# Patient Record
Sex: Male | Born: 1940
Health system: Southern US, Community
[De-identification: ages and names within clinical notes are randomized; demographics above are authoritative.]

## PROBLEM LIST (undated history)

## (undated) DIAGNOSIS — S83206A Unspecified tear of unspecified meniscus, current injury, right knee, initial encounter: Secondary | ICD-10-CM

## (undated) DIAGNOSIS — Z86711 Personal history of pulmonary embolism: Secondary | ICD-10-CM

## (undated) DIAGNOSIS — Z860100 Personal history of colon polyps, unspecified: Secondary | ICD-10-CM

## (undated) DIAGNOSIS — I10 Essential (primary) hypertension: Secondary | ICD-10-CM

## (undated) DIAGNOSIS — M1990 Unspecified osteoarthritis, unspecified site: Secondary | ICD-10-CM

## (undated) DIAGNOSIS — E785 Hyperlipidemia, unspecified: Secondary | ICD-10-CM

## (undated) DIAGNOSIS — Z9189 Other specified personal risk factors, not elsewhere classified: Secondary | ICD-10-CM

## (undated) DIAGNOSIS — R5382 Chronic fatigue, unspecified: Secondary | ICD-10-CM

## (undated) DIAGNOSIS — H269 Unspecified cataract: Secondary | ICD-10-CM

## (undated) DIAGNOSIS — F419 Anxiety disorder, unspecified: Secondary | ICD-10-CM

## (undated) HISTORY — DX: Hyperlipidemia, unspecified: E78.5

## (undated) HISTORY — DX: Essential (primary) hypertension: I10

## (undated) HISTORY — DX: Unspecified cataract: H26.9

## (undated) HISTORY — DX: Anxiety disorder, unspecified: F41.9

## (undated) HISTORY — PX: COLONOSCOPY W/ POLYPECTOMY: SHX1380

---

## 1983-05-21 HISTORY — PX: NASAL FRACTURE SURGERY: SHX718

## 1998-08-24 ENCOUNTER — Other Ambulatory Visit: Admission: RE | Admit: 1998-08-24 | Discharge: 1998-08-24 | Payer: Self-pay | Admitting: Internal Medicine

## 2000-12-30 ENCOUNTER — Encounter: Payer: Self-pay | Admitting: Internal Medicine

## 2000-12-30 ENCOUNTER — Encounter (INDEPENDENT_AMBULATORY_CARE_PROVIDER_SITE_OTHER): Payer: Self-pay | Admitting: Specialist

## 2000-12-30 ENCOUNTER — Other Ambulatory Visit: Admission: RE | Admit: 2000-12-30 | Discharge: 2000-12-30 | Payer: Self-pay | Admitting: Internal Medicine

## 2003-09-07 ENCOUNTER — Encounter: Payer: Self-pay | Admitting: Internal Medicine

## 2004-10-31 ENCOUNTER — Ambulatory Visit: Payer: Self-pay | Admitting: Internal Medicine

## 2004-11-12 ENCOUNTER — Ambulatory Visit: Payer: Self-pay | Admitting: Internal Medicine

## 2004-12-12 ENCOUNTER — Ambulatory Visit: Payer: Self-pay | Admitting: Internal Medicine

## 2004-12-19 ENCOUNTER — Ambulatory Visit: Payer: Self-pay | Admitting: Internal Medicine

## 2005-05-27 ENCOUNTER — Ambulatory Visit: Payer: Self-pay | Admitting: Internal Medicine

## 2005-05-30 ENCOUNTER — Ambulatory Visit: Payer: Self-pay | Admitting: Internal Medicine

## 2005-07-15 ENCOUNTER — Ambulatory Visit: Payer: Self-pay | Admitting: Internal Medicine

## 2005-12-31 ENCOUNTER — Ambulatory Visit: Payer: Self-pay | Admitting: Internal Medicine

## 2006-01-15 ENCOUNTER — Ambulatory Visit: Payer: Self-pay | Admitting: Internal Medicine

## 2006-01-23 ENCOUNTER — Ambulatory Visit: Payer: Self-pay | Admitting: Internal Medicine

## 2006-05-15 ENCOUNTER — Ambulatory Visit: Payer: Self-pay | Admitting: Critical Care Medicine

## 2006-05-30 ENCOUNTER — Ambulatory Visit: Payer: Self-pay | Admitting: Internal Medicine

## 2006-06-04 ENCOUNTER — Ambulatory Visit: Payer: Self-pay | Admitting: Cardiology

## 2006-07-24 ENCOUNTER — Encounter: Admission: RE | Admit: 2006-07-24 | Discharge: 2006-07-29 | Payer: Self-pay | Admitting: Neurology

## 2007-06-15 ENCOUNTER — Telehealth (INDEPENDENT_AMBULATORY_CARE_PROVIDER_SITE_OTHER): Payer: Self-pay | Admitting: *Deleted

## 2007-07-01 DIAGNOSIS — E785 Hyperlipidemia, unspecified: Secondary | ICD-10-CM

## 2007-07-01 DIAGNOSIS — I10 Essential (primary) hypertension: Secondary | ICD-10-CM | POA: Insufficient documentation

## 2007-07-01 DIAGNOSIS — Z860100 Personal history of colon polyps, unspecified: Secondary | ICD-10-CM | POA: Insufficient documentation

## 2007-07-01 DIAGNOSIS — Z8669 Personal history of other diseases of the nervous system and sense organs: Secondary | ICD-10-CM | POA: Insufficient documentation

## 2007-07-02 ENCOUNTER — Ambulatory Visit: Payer: Self-pay | Admitting: Internal Medicine

## 2007-07-02 DIAGNOSIS — K625 Hemorrhage of anus and rectum: Secondary | ICD-10-CM

## 2007-07-02 DIAGNOSIS — D508 Other iron deficiency anemias: Secondary | ICD-10-CM | POA: Insufficient documentation

## 2007-07-06 ENCOUNTER — Ambulatory Visit: Payer: Self-pay | Admitting: Internal Medicine

## 2007-07-24 ENCOUNTER — Ambulatory Visit: Payer: Self-pay | Admitting: Internal Medicine

## 2007-07-24 ENCOUNTER — Encounter: Payer: Self-pay | Admitting: Internal Medicine

## 2007-08-05 ENCOUNTER — Ambulatory Visit: Payer: Self-pay | Admitting: Internal Medicine

## 2007-09-07 ENCOUNTER — Ambulatory Visit: Payer: Self-pay | Admitting: Internal Medicine

## 2007-09-07 LAB — CONVERTED CEMR LAB
Eosinophils Absolute: 0 10*3/uL (ref 0.0–0.7)
HCT: 42.5 % (ref 39.0–52.0)
Hemoglobin: 14.3 g/dL (ref 13.0–17.0)
MCV: 80.1 fL (ref 78.0–100.0)
Monocytes Absolute: 0.9 10*3/uL (ref 0.1–1.0)
Monocytes Relative: 9.2 % (ref 3.0–12.0)
Neutro Abs: 6.9 10*3/uL (ref 1.4–7.7)
Platelets: 250 10*3/uL (ref 150–400)
RDW: 21.1 % — ABNORMAL HIGH (ref 11.5–14.6)

## 2007-11-10 ENCOUNTER — Ambulatory Visit: Payer: Self-pay | Admitting: Internal Medicine

## 2008-04-29 ENCOUNTER — Telehealth: Payer: Self-pay | Admitting: Internal Medicine

## 2008-05-10 ENCOUNTER — Encounter: Payer: Self-pay | Admitting: Internal Medicine

## 2008-06-01 ENCOUNTER — Ambulatory Visit: Payer: Self-pay | Admitting: Internal Medicine

## 2008-06-01 DIAGNOSIS — K5150 Left sided colitis without complications: Secondary | ICD-10-CM | POA: Insufficient documentation

## 2008-06-09 ENCOUNTER — Encounter: Payer: Self-pay | Admitting: Internal Medicine

## 2008-06-28 ENCOUNTER — Ambulatory Visit: Payer: Self-pay | Admitting: Internal Medicine

## 2008-06-28 ENCOUNTER — Inpatient Hospital Stay (HOSPITAL_COMMUNITY): Admission: AD | Admit: 2008-06-28 | Discharge: 2008-07-03 | Payer: Self-pay | Admitting: Pulmonary Disease

## 2008-06-28 ENCOUNTER — Encounter (INDEPENDENT_AMBULATORY_CARE_PROVIDER_SITE_OTHER): Payer: Self-pay | Admitting: *Deleted

## 2008-06-28 DIAGNOSIS — R079 Chest pain, unspecified: Secondary | ICD-10-CM | POA: Insufficient documentation

## 2008-07-01 ENCOUNTER — Ambulatory Visit: Payer: Self-pay | Admitting: Gastroenterology

## 2008-07-05 ENCOUNTER — Encounter: Payer: Self-pay | Admitting: Internal Medicine

## 2008-07-06 ENCOUNTER — Inpatient Hospital Stay (HOSPITAL_COMMUNITY): Admission: EM | Admit: 2008-07-06 | Discharge: 2008-07-13 | Payer: Self-pay | Admitting: Emergency Medicine

## 2008-07-06 ENCOUNTER — Ambulatory Visit: Payer: Self-pay | Admitting: Internal Medicine

## 2008-07-07 ENCOUNTER — Ambulatory Visit: Payer: Self-pay | Admitting: Vascular Surgery

## 2008-07-07 ENCOUNTER — Encounter: Payer: Self-pay | Admitting: Internal Medicine

## 2008-07-08 ENCOUNTER — Encounter (INDEPENDENT_AMBULATORY_CARE_PROVIDER_SITE_OTHER): Payer: Self-pay | Admitting: Internal Medicine

## 2008-07-15 ENCOUNTER — Ambulatory Visit: Payer: Self-pay | Admitting: Internal Medicine

## 2008-07-20 ENCOUNTER — Ambulatory Visit: Payer: Self-pay | Admitting: Internal Medicine

## 2008-07-20 DIAGNOSIS — I2699 Other pulmonary embolism without acute cor pulmonale: Secondary | ICD-10-CM | POA: Insufficient documentation

## 2008-07-20 LAB — CONVERTED CEMR LAB
Basophils Absolute: 0 10*3/uL (ref 0.0–0.1)
Eosinophils Absolute: 0.3 10*3/uL (ref 0.0–0.7)
Lymphocytes Relative: 15.8 % (ref 12.0–46.0)
MCHC: 33.1 g/dL (ref 30.0–36.0)
MCV: 85.3 fL (ref 78.0–100.0)
Neutrophils Relative %: 77.3 % — ABNORMAL HIGH (ref 43.0–77.0)
Platelets: 254 10*3/uL (ref 150–400)
RDW: 14.2 % (ref 11.5–14.6)

## 2008-07-21 ENCOUNTER — Telehealth (INDEPENDENT_AMBULATORY_CARE_PROVIDER_SITE_OTHER): Payer: Self-pay | Admitting: *Deleted

## 2008-07-25 ENCOUNTER — Ambulatory Visit: Payer: Self-pay | Admitting: Internal Medicine

## 2008-08-02 ENCOUNTER — Emergency Department (HOSPITAL_COMMUNITY): Admission: EM | Admit: 2008-08-02 | Discharge: 2008-08-02 | Payer: Self-pay | Admitting: Emergency Medicine

## 2008-08-03 ENCOUNTER — Ambulatory Visit: Payer: Self-pay | Admitting: Internal Medicine

## 2008-08-03 ENCOUNTER — Ambulatory Visit: Payer: Self-pay | Admitting: Cardiology

## 2008-08-04 ENCOUNTER — Emergency Department (HOSPITAL_COMMUNITY): Admission: EM | Admit: 2008-08-04 | Discharge: 2008-08-04 | Payer: Self-pay | Admitting: *Deleted

## 2008-08-17 ENCOUNTER — Ambulatory Visit: Payer: Self-pay | Admitting: Cardiology

## 2008-08-17 ENCOUNTER — Ambulatory Visit: Payer: Self-pay | Admitting: Internal Medicine

## 2008-08-30 ENCOUNTER — Ambulatory Visit: Payer: Self-pay | Admitting: Cardiology

## 2008-09-13 ENCOUNTER — Ambulatory Visit: Payer: Self-pay | Admitting: Cardiology

## 2008-10-04 ENCOUNTER — Ambulatory Visit: Payer: Self-pay | Admitting: Cardiovascular Disease

## 2008-10-10 ENCOUNTER — Telehealth (INDEPENDENT_AMBULATORY_CARE_PROVIDER_SITE_OTHER): Payer: Self-pay | Admitting: *Deleted

## 2008-10-18 ENCOUNTER — Encounter: Payer: Self-pay | Admitting: *Deleted

## 2008-10-21 ENCOUNTER — Ambulatory Visit: Payer: Self-pay | Admitting: Internal Medicine

## 2008-10-21 DIAGNOSIS — R5381 Other malaise: Secondary | ICD-10-CM

## 2008-10-21 DIAGNOSIS — R5383 Other fatigue: Secondary | ICD-10-CM

## 2008-11-01 ENCOUNTER — Ambulatory Visit: Payer: Self-pay

## 2008-11-01 ENCOUNTER — Encounter: Payer: Self-pay | Admitting: Internal Medicine

## 2008-11-01 HISTORY — PX: TRANSTHORACIC ECHOCARDIOGRAM: SHX275

## 2008-11-02 ENCOUNTER — Telehealth (INDEPENDENT_AMBULATORY_CARE_PROVIDER_SITE_OTHER): Payer: Self-pay | Admitting: *Deleted

## 2008-11-04 ENCOUNTER — Telehealth: Payer: Self-pay | Admitting: Internal Medicine

## 2008-11-08 ENCOUNTER — Ambulatory Visit: Payer: Self-pay | Admitting: Internal Medicine

## 2008-11-08 DIAGNOSIS — IMO0002 Reserved for concepts with insufficient information to code with codable children: Secondary | ICD-10-CM

## 2008-11-14 ENCOUNTER — Telehealth (INDEPENDENT_AMBULATORY_CARE_PROVIDER_SITE_OTHER): Payer: Self-pay | Admitting: *Deleted

## 2008-11-17 ENCOUNTER — Encounter: Payer: Self-pay | Admitting: Internal Medicine

## 2008-11-23 ENCOUNTER — Encounter: Payer: Self-pay | Admitting: *Deleted

## 2008-12-21 ENCOUNTER — Ambulatory Visit: Payer: Self-pay | Admitting: Internal Medicine

## 2008-12-28 ENCOUNTER — Encounter: Payer: Self-pay | Admitting: Cardiology

## 2009-02-16 ENCOUNTER — Ambulatory Visit: Payer: Self-pay | Admitting: Internal Medicine

## 2009-02-16 DIAGNOSIS — R51 Headache: Secondary | ICD-10-CM

## 2009-02-16 DIAGNOSIS — R519 Headache, unspecified: Secondary | ICD-10-CM | POA: Insufficient documentation

## 2009-04-24 ENCOUNTER — Encounter: Payer: Self-pay | Admitting: Internal Medicine

## 2009-05-09 ENCOUNTER — Ambulatory Visit: Payer: Self-pay | Admitting: Internal Medicine

## 2009-05-10 LAB — CONVERTED CEMR LAB
Alkaline Phosphatase: 66 units/L (ref 39–117)
BUN: 12 mg/dL (ref 6–23)
Basophils Relative: 0.8 % (ref 0.0–3.0)
CRP, High Sensitivity: 21.4 — ABNORMAL HIGH (ref 0.00–5.00)
Eosinophils Relative: 2.8 % (ref 0.0–5.0)
Glucose, Bld: 100 mg/dL — ABNORMAL HIGH (ref 70–99)
HCT: 41.7 % (ref 39.0–52.0)
Lymphs Abs: 2.7 10*3/uL (ref 0.7–4.0)
MCV: 83.3 fL (ref 78.0–100.0)
Monocytes Absolute: 0.9 10*3/uL (ref 0.1–1.0)
Platelets: 267 10*3/uL (ref 150.0–400.0)
RBC: 5.01 M/uL (ref 4.22–5.81)
Total Bilirubin: 0.4 mg/dL (ref 0.3–1.2)
WBC: 7.8 10*3/uL (ref 4.5–10.5)

## 2009-05-26 ENCOUNTER — Ambulatory Visit: Payer: Self-pay | Admitting: Internal Medicine

## 2009-05-26 DIAGNOSIS — R05 Cough: Secondary | ICD-10-CM | POA: Insufficient documentation

## 2009-06-07 ENCOUNTER — Encounter: Payer: Self-pay | Admitting: Internal Medicine

## 2009-06-08 ENCOUNTER — Ambulatory Visit: Payer: Self-pay | Admitting: Internal Medicine

## 2009-06-10 ENCOUNTER — Encounter: Payer: Self-pay | Admitting: Internal Medicine

## 2009-06-13 ENCOUNTER — Ambulatory Visit: Payer: Self-pay | Admitting: Internal Medicine

## 2009-06-14 LAB — CONVERTED CEMR LAB
ALT: 17 units/L (ref 0–53)
AST: 18 units/L (ref 0–37)
Albumin: 3.4 g/dL — ABNORMAL LOW (ref 3.5–5.2)
Basophils Absolute: 0.1 10*3/uL (ref 0.0–0.1)
Calcium: 9.2 mg/dL (ref 8.4–10.5)
Cholesterol: 149 mg/dL (ref 0–200)
GFR calc non Af Amer: 89.67 mL/min (ref >60)
Glucose, Bld: 110 mg/dL — ABNORMAL HIGH (ref 70–99)
HDL: 34.1 mg/dL — ABNORMAL LOW (ref >39.00)
Hemoglobin: 12.6 g/dL — ABNORMAL LOW (ref 13.0–17.0)
Ketones, ur: NEGATIVE mg/dL
Leukocytes, UA: NEGATIVE
Lymphocytes Relative: 37.5 % (ref 12.0–46.0)
Monocytes Relative: 13 % — ABNORMAL HIGH (ref 3.0–12.0)
Neutro Abs: 3.2 10*3/uL (ref 1.4–7.7)
Neutrophils Relative %: 44.1 % (ref 43.0–77.0)
Nitrite: NEGATIVE
RBC: 4.91 M/uL (ref 4.22–5.81)
RDW: 14.6 % (ref 11.5–14.6)
Sodium: 140 meq/L (ref 135–145)
Specific Gravity, Urine: >=1.03 (ref 1.000–1.030)
VLDL: 18.6 mg/dL (ref 0.0–40.0)
pH: 5.5 (ref 5.0–8.0)

## 2009-06-15 ENCOUNTER — Telehealth: Payer: Self-pay | Admitting: Internal Medicine

## 2009-06-17 IMAGING — CT THORAX^01PE (ADULT)
2 of 6 series · 14 of 36 positions shown, 18 images · IV contrast (APPLIED)
Comparison: CTA chest 07/06/2008 and chest CT 06/29/2008

CLINICAL DATA: Shortness of breath patient with recent pneumonia
and history of pulmonary emboli.  The patient is on Coumadin.

CT ANGIOGRAPHY CHEST
TECHNIQUE: Multidetector CT imaging of the chest was performed
using the standard protocol during bolus administration of
intravenous contrast. Multiplanar CT image reconstructions
including MIPs were obtained to evaluate the vascular anatomy.
Contrast: 80 ml [id number]-X77

[Series 6: pe thins @ 1mm · axial · 0.78mm/px · z∈[-196,+36]mm · 13 of 258 slices shown, 17 images]
[im 13/258  mediastinal]
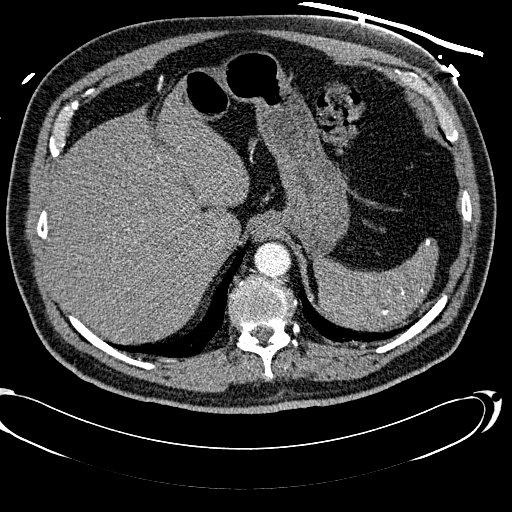
[im 13/258  lung]
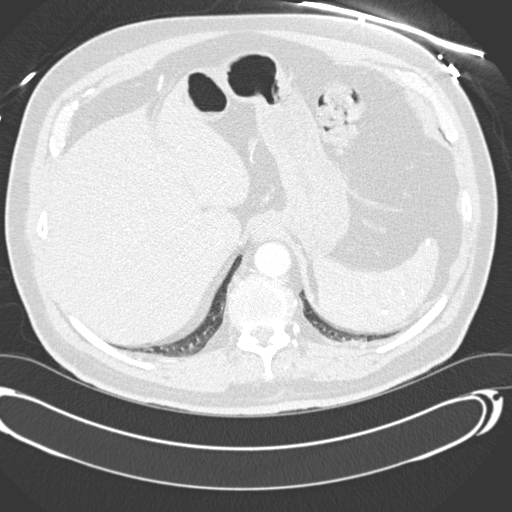
[im 39/258  lung]
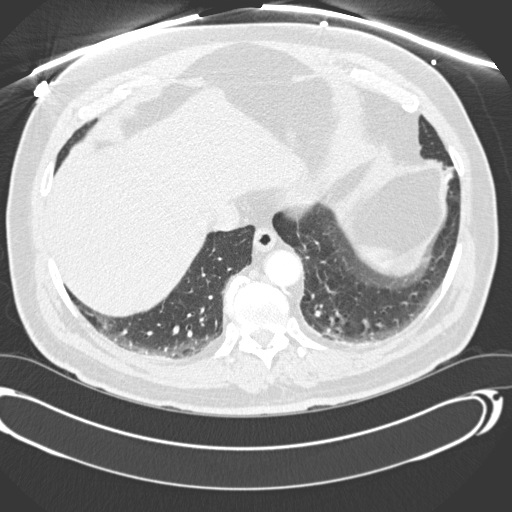
[im 52/258  lung]
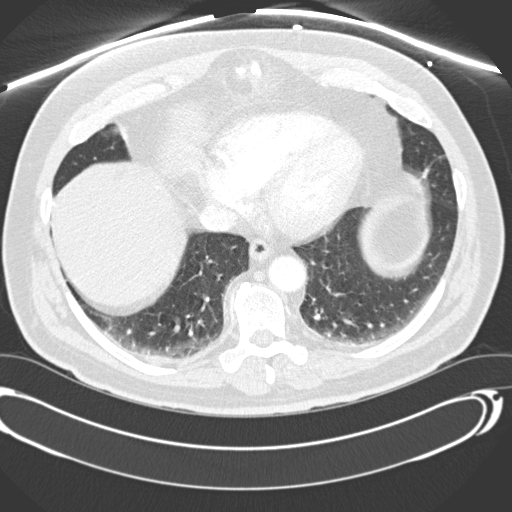
[im 78/258  lung]
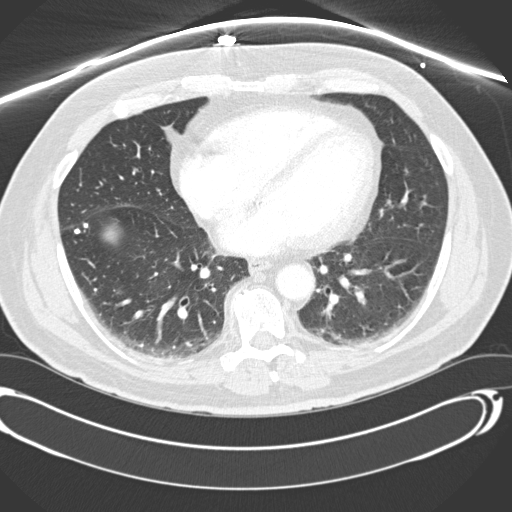
[im 90/258  mediastinal]
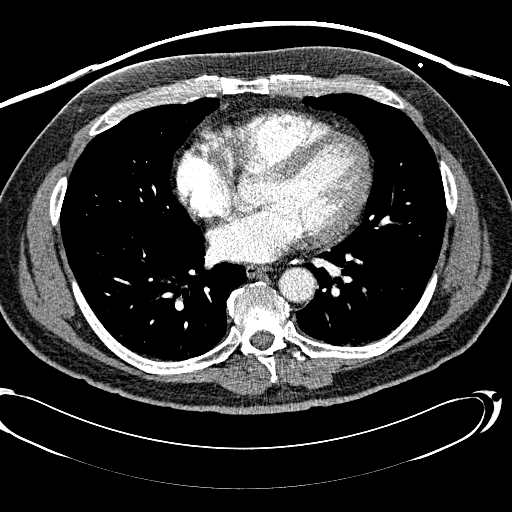
[im 90/258  lung]
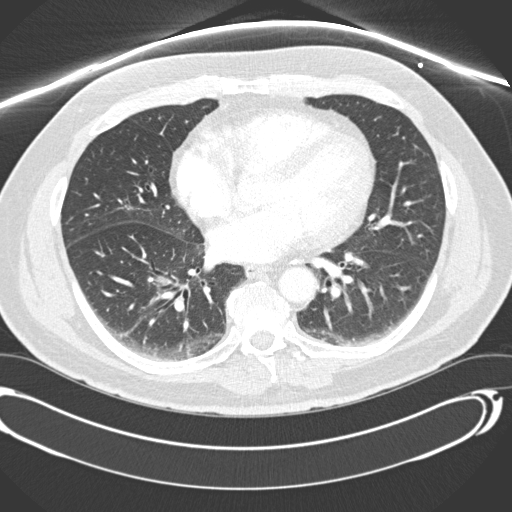
[im 116/258  lung]
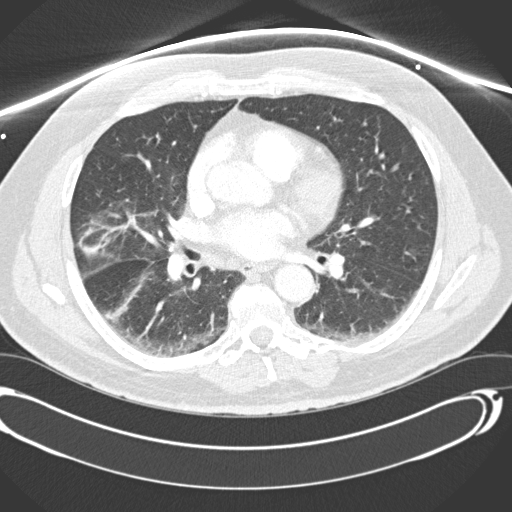
[im 129/258  lung]
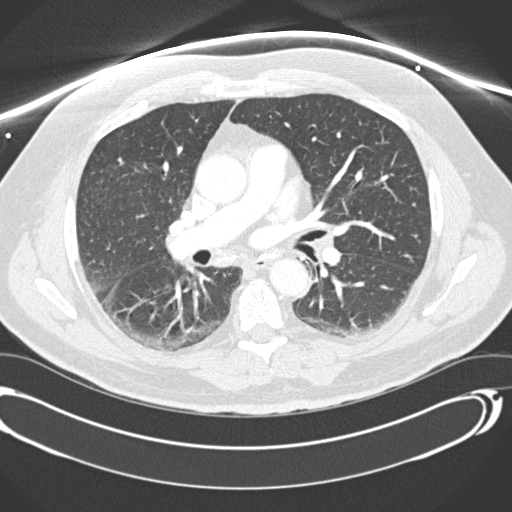
[im 142/258  lung]
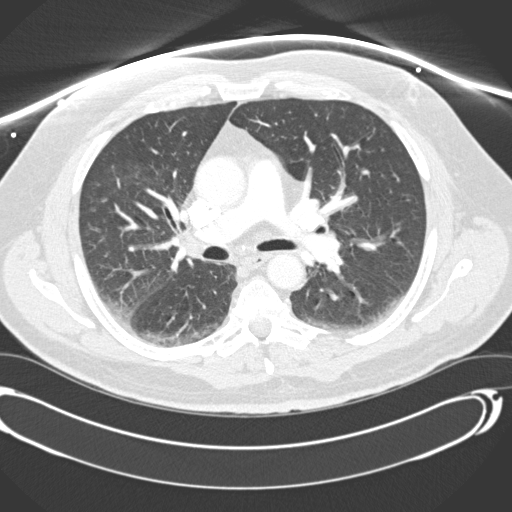
[im 168/258  mediastinal]
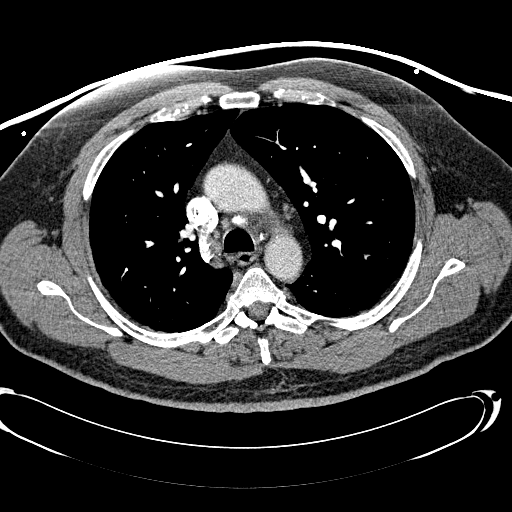
[im 168/258  lung]
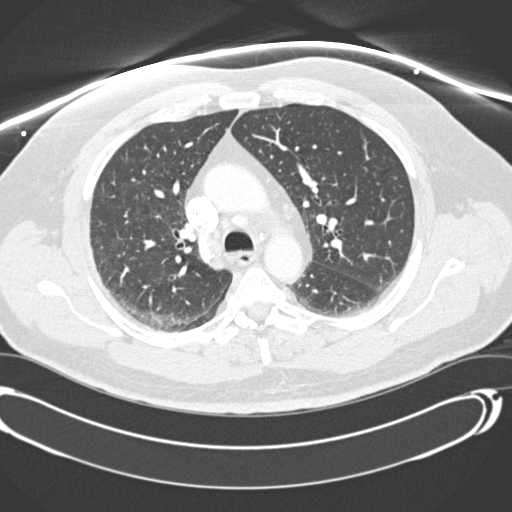
[im 180/258  lung]
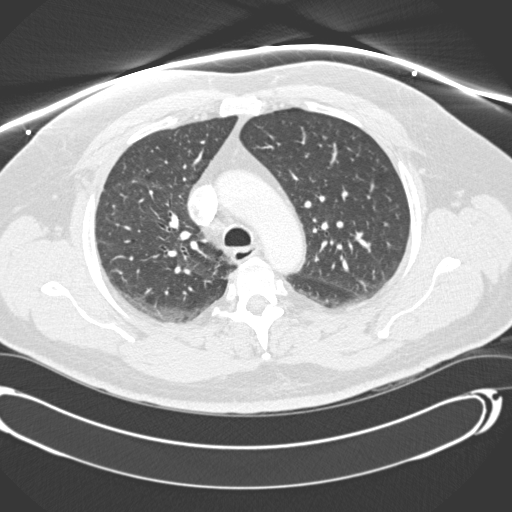
[im 206/258  lung]
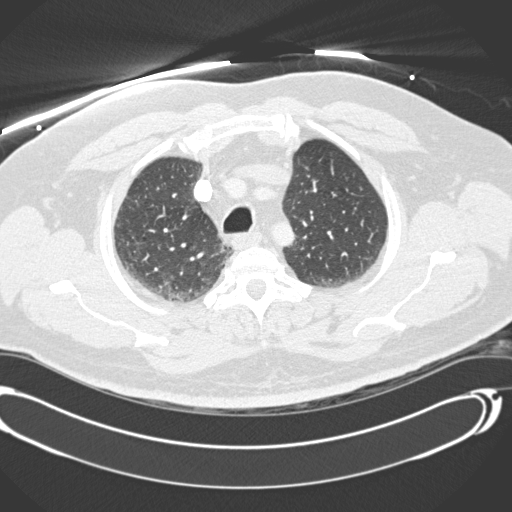
[im 219/258  lung]
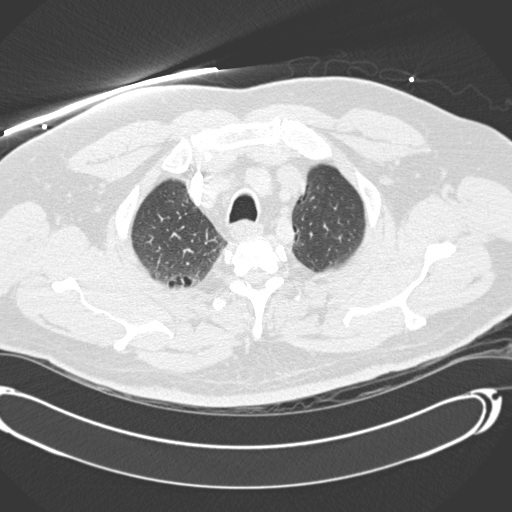
[im 245/258  mediastinal]
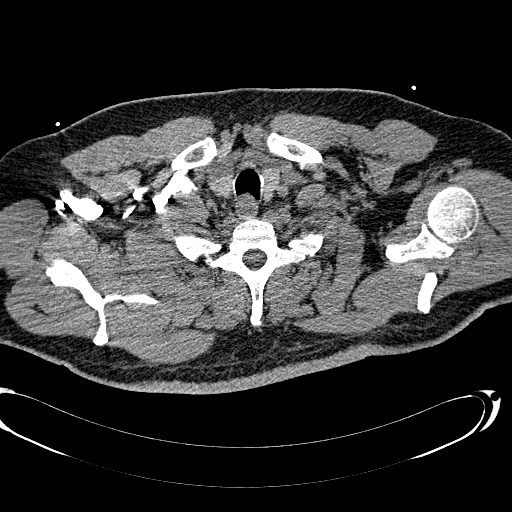
[im 245/258  lung]
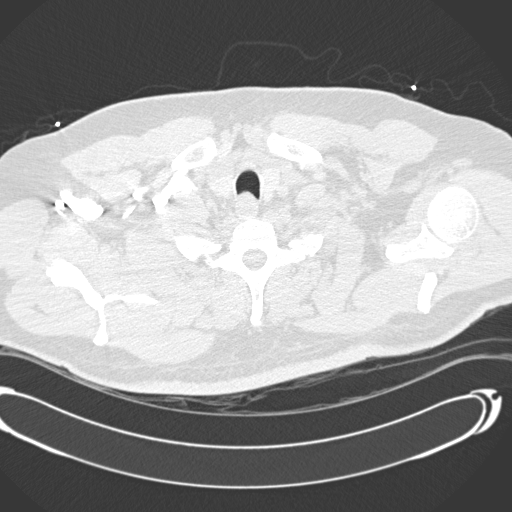

[Series 603: <mpr thick range> · coronal · 0.78mm/px · 1 of 121 slices shown]
[im 61/121  lung]
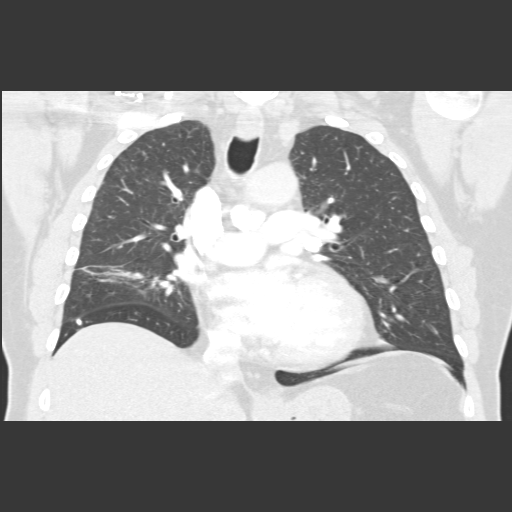

[14 of 36 positions shown; findings below may reference images not displayed]

FINDINGS: This is a technically moderate study for evaluating the
pulmonary arterial tree.  Compared to the CTA of the chest from
July 06, 2008, there has been marked recanalization of the
pulmonary arteries (the degree of pulmonary arterial embolism is
markedly decreased/nearly completely resolved on the current
examination).  On image number 182 of series 6, there is a small
residual filling defect in a subsegmental branch of the left lower
lobe.

There is no evidence of new pulmonary emboli compared to the
June 2008 examination.  Again noted are calcified mediastinal
lymph and hilar lymph nodes consistent with prior granulomatous
disease.  Coronary artery calcifications.  Mild cardiomegaly.
Thoracic aorta normal in caliber.  Negative for dissection.  Mild
paraseptal emphysema is present.  Ground-glass and interstitial
opacities in the right upper lobe have decreased, but not
completely resolved compared to the recent prior examinations.
Dependent atelectasis in both lower lobes.  No new areas of
airspace disease are identified.

There is no pleural or pericardial effusion.

Normal vertebral body heights and alignment of the visualized
thoracic spine.

 Review of the MIP images confirms the above findings.
IMPRESSION: 1. Near complete resolution of bilateral pulmonary emboli compared
to the CTA of 07/06/2008.  No evidence of new/interval pulmonary
emboli are identified.  Currently, only one definite filling defect
is identified in a subsegmental branch of the left lower lobe.
2.  Improved aeration of the right upper lobe, likely reflects
continued improvement in pneumonia, which was initially identified
on the chest CT June 29, 2008/chest radiograph of 06/28/2008.
[DATE].  No acute findings in the chest.

## 2009-06-23 ENCOUNTER — Ambulatory Visit: Payer: Self-pay | Admitting: Internal Medicine

## 2009-06-27 DIAGNOSIS — G4700 Insomnia, unspecified: Secondary | ICD-10-CM

## 2009-07-04 ENCOUNTER — Encounter: Payer: Self-pay | Admitting: Internal Medicine

## 2009-07-17 ENCOUNTER — Telehealth: Payer: Self-pay | Admitting: Internal Medicine

## 2009-07-17 ENCOUNTER — Ambulatory Visit: Payer: Self-pay | Admitting: Internal Medicine

## 2009-07-17 DIAGNOSIS — K602 Anal fissure, unspecified: Secondary | ICD-10-CM

## 2009-07-20 ENCOUNTER — Telehealth: Payer: Self-pay | Admitting: Internal Medicine

## 2009-07-27 ENCOUNTER — Telehealth: Payer: Self-pay | Admitting: Internal Medicine

## 2009-07-28 ENCOUNTER — Telehealth: Payer: Self-pay | Admitting: Internal Medicine

## 2009-08-01 ENCOUNTER — Ambulatory Visit: Payer: Self-pay | Admitting: Internal Medicine

## 2009-08-01 ENCOUNTER — Encounter: Payer: Self-pay | Admitting: Adult Health

## 2009-08-01 DIAGNOSIS — H6120 Impacted cerumen, unspecified ear: Secondary | ICD-10-CM | POA: Insufficient documentation

## 2009-08-01 DIAGNOSIS — R3129 Other microscopic hematuria: Secondary | ICD-10-CM

## 2009-08-03 LAB — CONVERTED CEMR LAB
Nitrite: NEGATIVE
Specific Gravity, Urine: >=1.03 (ref 1.000–1.030)
Total Protein, Urine: NEGATIVE mg/dL
pH: 5 (ref 5.0–8.0)

## 2009-08-04 ENCOUNTER — Telehealth: Payer: Self-pay | Admitting: Internal Medicine

## 2009-08-04 LAB — CONVERTED CEMR LAB
Basophils Absolute: 0.2 10*3/uL — ABNORMAL HIGH (ref 0.0–0.1)
HCT: 38.8 % — ABNORMAL LOW (ref 39.0–52.0)
Lymphocytes Relative: 33.7 % (ref 12.0–46.0)
Lymphs Abs: 3.5 10*3/uL (ref 0.7–4.0)
Monocytes Relative: 8.2 % (ref 3.0–12.0)
Neutrophils Relative %: 55.4 % (ref 43.0–77.0)
Platelets: 289 10*3/uL (ref 150.0–400.0)
RDW: 16.2 % — ABNORMAL HIGH (ref 11.5–14.6)

## 2009-08-11 ENCOUNTER — Telehealth: Payer: Self-pay | Admitting: Internal Medicine

## 2009-08-14 ENCOUNTER — Ambulatory Visit: Payer: Self-pay | Admitting: Internal Medicine

## 2009-08-14 LAB — CONVERTED CEMR LAB
Eosinophils Relative: 1.1 % (ref 0.0–5.0)
HCT: 37.9 % — ABNORMAL LOW (ref 39.0–52.0)
Lymphs Abs: 2.6 10*3/uL (ref 0.7–4.0)
Monocytes Relative: 6.6 % (ref 3.0–12.0)
Platelets: 247 10*3/uL (ref 150.0–400.0)
RBC: 4.59 M/uL (ref 4.22–5.81)
WBC: 8.4 10*3/uL (ref 4.5–10.5)

## 2009-08-22 ENCOUNTER — Encounter: Payer: Self-pay | Admitting: Adult Health

## 2009-08-29 ENCOUNTER — Ambulatory Visit: Payer: Self-pay | Admitting: Internal Medicine

## 2009-08-30 LAB — CONVERTED CEMR LAB
Basophils Relative: 0.6 % (ref 0.0–3.0)
Eosinophils Relative: 0.9 % (ref 0.0–5.0)
HCT: 38.8 % — ABNORMAL LOW (ref 39.0–52.0)
Lymphs Abs: 2.9 10*3/uL (ref 0.7–4.0)
MCV: 82.4 fL (ref 78.0–100.0)
Monocytes Absolute: 0.6 10*3/uL (ref 0.1–1.0)
Platelets: 307 10*3/uL (ref 150.0–400.0)
RBC: 4.71 M/uL (ref 4.22–5.81)
WBC: 8.1 10*3/uL (ref 4.5–10.5)

## 2009-09-07 ENCOUNTER — Telehealth: Payer: Self-pay | Admitting: Internal Medicine

## 2009-09-11 ENCOUNTER — Telehealth: Payer: Self-pay | Admitting: Internal Medicine

## 2009-09-15 ENCOUNTER — Telehealth: Payer: Self-pay | Admitting: Internal Medicine

## 2009-09-18 ENCOUNTER — Ambulatory Visit: Payer: Self-pay | Admitting: Internal Medicine

## 2009-09-18 LAB — CONVERTED CEMR LAB
Eosinophils Absolute: 0.1 10*3/uL (ref 0.0–0.7)
MCHC: 33.9 g/dL (ref 30.0–36.0)
MCV: 82 fL (ref 78.0–100.0)
Monocytes Absolute: 0.7 10*3/uL (ref 0.1–1.0)
Neutrophils Relative %: 54.2 % (ref 43.0–77.0)
Platelets: 256 10*3/uL (ref 150.0–400.0)

## 2009-09-26 ENCOUNTER — Telehealth: Payer: Self-pay | Admitting: Internal Medicine

## 2009-09-26 ENCOUNTER — Ambulatory Visit: Payer: Self-pay | Admitting: Internal Medicine

## 2009-10-02 ENCOUNTER — Telehealth: Payer: Self-pay | Admitting: Internal Medicine

## 2009-10-18 ENCOUNTER — Ambulatory Visit: Payer: Self-pay | Admitting: Internal Medicine

## 2009-10-18 LAB — CONVERTED CEMR LAB
Basophils Relative: 0.9 % (ref 0.0–3.0)
Eosinophils Absolute: 0.2 10*3/uL (ref 0.0–0.7)
Eosinophils Relative: 2.6 % (ref 0.0–5.0)
Lymphocytes Relative: 29.4 % (ref 12.0–46.0)
MCHC: 33.4 g/dL (ref 30.0–36.0)
Neutrophils Relative %: 56.6 % (ref 43.0–77.0)
RBC: 4.76 M/uL (ref 4.22–5.81)
WBC: 7.1 10*3/uL (ref 4.5–10.5)

## 2009-12-19 ENCOUNTER — Telehealth: Payer: Self-pay | Admitting: Internal Medicine

## 2009-12-21 ENCOUNTER — Ambulatory Visit: Payer: Self-pay | Admitting: Internal Medicine

## 2009-12-21 LAB — CONVERTED CEMR LAB
Basophils Relative: 0.8 % (ref 0.0–3.0)
Eosinophils Absolute: 0.4 10*3/uL (ref 0.0–0.7)
Eosinophils Relative: 4.1 % (ref 0.0–5.0)
Hemoglobin: 11.9 g/dL — ABNORMAL LOW (ref 13.0–17.0)
Lymphocytes Relative: 29 % (ref 12.0–46.0)
MCHC: 33.8 g/dL (ref 30.0–36.0)
Neutro Abs: 5 10*3/uL (ref 1.4–7.7)
RBC: 4.06 M/uL — ABNORMAL LOW (ref 4.22–5.81)
WBC: 8.9 10*3/uL (ref 4.5–10.5)

## 2009-12-25 ENCOUNTER — Ambulatory Visit: Payer: Self-pay | Admitting: Internal Medicine

## 2009-12-25 DIAGNOSIS — M79609 Pain in unspecified limb: Secondary | ICD-10-CM | POA: Insufficient documentation

## 2010-01-16 ENCOUNTER — Ambulatory Visit: Payer: Self-pay | Admitting: Internal Medicine

## 2010-01-25 ENCOUNTER — Ambulatory Visit: Payer: Self-pay | Admitting: Internal Medicine

## 2010-01-26 ENCOUNTER — Telehealth: Payer: Self-pay | Admitting: Internal Medicine

## 2010-01-30 LAB — CONVERTED CEMR LAB
Basophils Absolute: 0.1 10*3/uL (ref 0.0–0.1)
Basophils Relative: 0.8 % (ref 0.0–3.0)
Eosinophils Absolute: 0.3 10*3/uL (ref 0.0–0.7)
HCT: 32.9 % — ABNORMAL LOW (ref 39.0–52.0)
Hemoglobin: 11.2 g/dL — ABNORMAL LOW (ref 13.0–17.0)
Lymphocytes Relative: 26.3 % (ref 12.0–46.0)
Lymphs Abs: 1.8 10*3/uL (ref 0.7–4.0)
MCHC: 34 g/dL (ref 30.0–36.0)
MCV: 85.8 fL (ref 78.0–100.0)
Monocytes Absolute: 0.7 10*3/uL (ref 0.1–1.0)
Neutro Abs: 3.9 10*3/uL (ref 1.4–7.7)
RDW: 17.5 % — ABNORMAL HIGH (ref 11.5–14.6)

## 2010-01-31 ENCOUNTER — Ambulatory Visit: Payer: Self-pay | Admitting: Internal Medicine

## 2010-03-09 ENCOUNTER — Encounter: Payer: Self-pay | Admitting: Internal Medicine

## 2010-03-15 ENCOUNTER — Ambulatory Visit: Payer: Self-pay | Admitting: Internal Medicine

## 2010-03-15 ENCOUNTER — Telehealth: Payer: Self-pay | Admitting: Internal Medicine

## 2010-04-17 ENCOUNTER — Telehealth: Payer: Self-pay | Admitting: Internal Medicine

## 2010-04-19 ENCOUNTER — Ambulatory Visit: Payer: Self-pay | Admitting: Internal Medicine

## 2010-04-23 LAB — CONVERTED CEMR LAB
Basophils Absolute: 0 10*3/uL (ref 0.0–0.1)
Eosinophils Absolute: 0.1 10*3/uL (ref 0.0–0.7)
Lymphocytes Relative: 27.1 % (ref 12.0–46.0)
MCHC: 33.7 g/dL (ref 30.0–36.0)
Monocytes Relative: 13.3 % — ABNORMAL HIGH (ref 3.0–12.0)
Platelets: 244 10*3/uL (ref 150.0–400.0)
RDW: 17.8 % — ABNORMAL HIGH (ref 11.5–14.6)

## 2010-04-30 ENCOUNTER — Ambulatory Visit: Payer: Self-pay | Admitting: Internal Medicine

## 2010-05-04 ENCOUNTER — Encounter: Payer: Self-pay | Admitting: Internal Medicine

## 2010-05-08 ENCOUNTER — Ambulatory Visit: Payer: Self-pay | Admitting: Internal Medicine

## 2010-06-14 ENCOUNTER — Ambulatory Visit: Admit: 2010-06-14 | Payer: Self-pay | Admitting: Internal Medicine

## 2010-06-17 LAB — CONVERTED CEMR LAB
ALT: 13 units/L (ref 0–53)
ALT: 14 units/L (ref 0–53)
AST: 16 units/L (ref 0–37)
AST: 17 units/L (ref 0–37)
Albumin: 3.2 g/dL — ABNORMAL LOW (ref 3.5–5.2)
Albumin: 3.3 g/dL — ABNORMAL LOW (ref 3.5–5.2)
Alkaline Phosphatase: 67 units/L (ref 39–117)
BUN: 14 mg/dL (ref 6–23)
Bacteria, UA: NEGATIVE
Basophils Absolute: 0 10*3/uL (ref 0.0–0.1)
Basophils Absolute: 0.1 10*3/uL (ref 0.0–0.1)
Basophils Absolute: 0.1 10*3/uL (ref 0.0–0.1)
Basophils Relative: 0.8 % (ref 0.0–3.0)
Basophils Relative: 1.6 % — ABNORMAL HIGH (ref 0.0–1.0)
Bilirubin Urine: NEGATIVE
CO2: 27 meq/L (ref 19–32)
CO2: 27 meq/L (ref 19–32)
CO2: 29 meq/L (ref 19–32)
Calcium: 9 mg/dL (ref 8.4–10.5)
Calcium: 9.1 mg/dL (ref 8.4–10.5)
Chloride: 107 meq/L (ref 96–112)
Chloride: 108 meq/L (ref 96–112)
Chloride: 114 meq/L — ABNORMAL HIGH (ref 96–112)
Creatinine, Ser: 1 mg/dL (ref 0.4–1.5)
Creatinine, Ser: 1 mg/dL (ref 0.4–1.5)
Crystals: NEGATIVE
Eosinophils Absolute: 0.3 10*3/uL (ref 0.0–0.7)
Eosinophils Relative: 2.3 % (ref 0.0–5.0)
Eosinophils Relative: 4 % (ref 0.0–5.0)
GFR calc Af Amer: 96 mL/min
Glucose, Bld: 97 mg/dL (ref 70–99)
HCT: 36.9 % — ABNORMAL LOW (ref 39.0–52.0)
Hemoglobin: 11.8 g/dL — ABNORMAL LOW (ref 13.0–17.0)
Hemoglobin: 13.8 g/dL (ref 13.0–17.0)
Ketones, ur: NEGATIVE mg/dL
LDL Cholesterol: 105 mg/dL — ABNORMAL HIGH (ref 0–99)
Leukocytes, UA: NEGATIVE
Lymphocytes Relative: 29.4 % (ref 12.0–46.0)
Lymphocytes Relative: 31.1 % (ref 12.0–46.0)
MCHC: 33.8 g/dL (ref 30.0–36.0)
MCV: 86.5 fL (ref 78.0–100.0)
Monocytes Absolute: 0.9 10*3/uL — ABNORMAL HIGH (ref 0.2–0.7)
Monocytes Relative: 10.5 % (ref 3.0–12.0)
Monocytes Relative: 11.3 % — ABNORMAL HIGH (ref 3.0–11.0)
Neutro Abs: 4.5 10*3/uL (ref 1.4–7.7)
Neutrophils Relative %: 55.8 % (ref 43.0–77.0)
Platelets: 255 10*3/uL (ref 150–400)
Potassium: 4.3 meq/L (ref 3.5–5.1)
Potassium: 4.7 meq/L (ref 3.5–5.1)
RBC: 4.74 M/uL (ref 4.22–5.81)
RBC: 4.84 M/uL (ref 4.22–5.81)
RBC: 5.02 M/uL (ref 4.22–5.81)
RDW: 13.6 % (ref 11.5–14.6)
RDW: 14.7 % — ABNORMAL HIGH (ref 11.5–14.6)
Specific Gravity, Urine: >=1.03 (ref 1.000–1.03)
TSH: 3.02 microintl units/mL (ref 0.35–5.50)
Total Bilirubin: 0.5 mg/dL (ref 0.3–1.2)
Total Protein, Urine: NEGATIVE mg/dL
Total Protein: 7.9 g/dL (ref 6.0–8.3)
Urobilinogen, UA: 0.2 (ref 0.0–1.0)
VLDL: 16 mg/dL (ref 0–40)
WBC: 7.8 10*3/uL (ref 4.5–10.5)
WBC: 8.1 10*3/uL (ref 4.5–10.5)
pH: 5.5 (ref 5.0–8.0)

## 2010-06-19 NOTE — Assessment & Plan Note (Signed)
Summary: flu shot/ mbw   Nurse Visit   Allergies: No Known Drug Allergies  Orders Added: 1)  Flu Vaccine 46yr + MEDICARE PATIENTS [Q2039] 2)  Administration Flu vaccine - MCR [[Y9244]Flu Vaccine Consent Questions     Do you have a history of severe allergic reactions to this vaccine? no    Any prior history of allergic reactions to egg and/or gelatin? no    Do you have a sensitivity to the preservative Thimersol? no    Do you have a past history of Guillan-Barre Syndrome? no    Do you currently have an acute febrile illness? no    Have you ever had a severe reaction to latex? no    Vaccine information given and explained to patient? yes    Are you currently pregnant? no    Lot Number:[id number]   Exp Date:11/17/2010   Site Given  Left Deltoid IMlbmedflu   Tammy Scott  January 31, 2010 9:50 AM

## 2010-06-19 NOTE — Progress Notes (Signed)
Summary: Lab work   Phone Note Call from Patient Call back at Work Phone [phone]   Caller: Patient Call For: Dr. Henrene Pastor Summary of Call: Pt. needs to sch'd his Labwork for December Initial call taken by: Webb Laws,  March 15, 2010 8:54 AM  Follow-up for Phone Call        called patient and advised he has an appt. with Dr. Henrene Pastor on 04/30/10 and he can come in anytime for his labs.   Follow-up by: Randye Lobo NCMA,  March 15, 2010 12:30 PM

## 2010-06-19 NOTE — Miscellaneous (Signed)
Summary: resend moviprep  Clinical Lists Changes  Medications: Rx of MOVIPREP 100 GM  SOLR (PEG-KCL-NACL-NASULF-NA ASC-C) As per prep instructions.;  #1 x 0;  Signed;  Entered by: Bernita Buffy CMA (AAMA);  Authorized by: Irene Shipper MD;  Method used: Electronically to [facility] [address] [place], [place], [place]  [address], Ph: [phone], Fax: [phone]    Prescriptions: MOVIPREP 100 GM  SOLR (PEG-KCL-NACL-NASULF-NA ASC-C) As per prep instructions.  #1 x 0   Entered by:   Bernita Buffy CMA ([place])   Authorized by:   Irene Shipper MD   Signed by:   Bernita Buffy CMA ([place]) on 06/07/2009   Method used:   Electronically to        [facility].* (retail)       [address]       [place], [place]  [address]       Ph: [phone]       Fax: [phone]   RxID:   [id number]

## 2010-06-19 NOTE — Assessment & Plan Note (Signed)
Summary: FOLLOWUP UC    History of Present Illness Visit Type: Follow-up Visit Primary GI MD: Scarlette Shorts MD Primary Provider: [name], MD Chief Complaint: Patient states that he has had a colitis flare past 3 to 4 days, with a lot of burnung History of Present Illness:   70 year old with hypertension, hyperlipidemia, history of pulmonary embolus, adenomatous colon polyps, and left-sided ulcerative colitis. He was last evaluated June 23, 2009 for ongoing significant colitis symptoms. This after a recent colonoscopy demonstrating significant active colitis. At the time of his last visit, he was continued on Asacol and initiated on prednisone 40 mg daily. We discussed immunomodulators therapy in great detail as well as provided him with literature. We discussed the rationale for starting immunomodulators therapy. TPMT phenotype was obtained in return normal. He presents today for followup. He has had significant improvement and colitis symptoms since initiating prednisone. He describes formed bowel movements with no mucus or blood. He has had increased appetite with weight gain. His new complaint today is that of burning with defecation x3 days. Basically, anal discomfort.   GI Review of Systems      Denies abdominal pain, acid reflux, belching, bloating, chest pain, dysphagia with liquids, dysphagia with solids, heartburn, loss of appetite, nausea, vomiting, vomiting blood, weight loss, and  weight gain.      Reports rectal pain.     Denies anal fissure, black tarry stools, change in bowel habit, constipation, diarrhea, diverticulosis, fecal incontinence, heme positive stool, hemorrhoids, irritable bowel syndrome, jaundice, light color stool, liver problems, and  rectal bleeding.    Current Medications (verified): 1)  Mirtazapine 30 Mg  Tabs (Mirtazapine) .... One At Bedtime 2)  Doxazosin Mesylate 8 Mg  Tabs (Doxazosin Mesylate) .... Take One Half By Mouth At Bedtime 3)  Asacol 400 Mg   Tbec (Mesalamine) .... 4 Tablets Three Times A Day 4)  Aspirin 81 Mg Tabs (Aspirin) .... One Each Am (On Hold Until Blood in Stool Clears) 5)  Viagra 50 Mg Tabs (Sildenafil Citrate) .... One Daily As Needed 6)  Prednisone 20 Mg Tabs (Prednisone) .... Take 2 Tablets By Mouth Once Daily 7)  Unisom 25 Mg Tabs (Doxylamine Succinate (Sleep)) .... At Bedtime  Allergies (verified): No Known Drug Allergies  Past History:  Past Medical History: Reviewed history from 07/04/2009 and no changes required. ULCERATIVE COLITIS...............................................................................Marland KitchenPerry + COLONIC POLYPS, HX OF (ICD-V12.72)     - colonoscopy 07/24/2007     - colonoscopy 06/08/09:  mucosal changes consistent with moderately severe     left-sided ulcerative colitis involving the rectum to distal  transverse colon confluent HYPERLIPIDEMIA (ICD-272.4)         -  target LDL < 130 (pos HBP, male, former smoker) MORBID OBESITY (ICD-278.01)     -  ideal = 168  target 208     -  Did not keep nutrition appt  07/04/09 BENIGN POSITIONAL VERTIGO, HX OF (ICD-V12.49) HYPERTENSION (ICD-401.9)  Microsocopic Hematuria, neg w/u 1999 PULMONARY EMBOLISM 07/06/2008     - Left DVT by venous doppler 07/07/2008 (asymptomatic) > neg venous doppler 11/01/08     - nl ECHO 07/08/08 HEALTH MAINTENANCE..............................................................................Marland KitchenWert     - Pneumovax 06/20/2008     - Td 06/2002     - CPX June 13, 2009   Past Surgical History: Reviewed history from 06/01/2008 and no changes required. Broken nose  Family History: Reviewed history from 06/23/2009 and no changes required. DM in mother, 2 paternal Uncles inflammatory bowel dz, or premature heart  dz Cancer: Father, unknown origion-? stomach Family History of Lung Cancer: 54 Brother No FH of Colon Cancer:  Social History: Reviewed history from 06/23/2009 and no changes required. 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  work fulltime- Armed forces operational officer  Review of Systems  The patient denies allergy/sinus, anemia, anxiety-new, arthritis/joint pain, back pain, blood in urine, breast changes/lumps, change in vision, confusion, cough, coughing up blood, depression-new, fainting, fatigue, fever, headaches-new, hearing problems, heart murmur, heart rhythm changes, itching, menstrual pain, muscle pains/cramps, night sweats, nosebleeds, pregnancy symptoms, shortness of breath, skin rash, sleeping problems, sore throat, swelling of feet/legs, swollen lymph glands, thirst - excessive , urination - excessive , urination changes/pain, urine leakage, vision changes, and voice change.    Vital Signs:  Patient profile:   70 year old male Height:      72 inches Weight:      263.25 pounds BMI:     35.83 Pulse rate:   100 / minute Pulse rhythm:   regular BP sitting:   132 / 80  (left arm) Cuff size:   regular  Vitals Entered By: June McMurray [place] Deborra Medina) (July 17, 2009 8:31 AM)  Physical Exam  General:  Well developed, well nourished, no acute distress. Head:  Normocephalic and atraumatic. Eyes:  PERRLA, no icterus. Mouth:  No deformity or lesions, Lungs:  Clear throughout to auscultation. Heart:  Regular rate and rhythm; no murmurs, rubs,  or bruits. Abdomen:  Soft, obese, nontender and nondistended. No masses, hepatosplenomegaly or hernias noted. Normal bowel sounds. Msk:  Symmetrical with no gross deformities. Normal posture. Pulses:  Normal pulses noted. Extremities:  duodenal Neurologic:  Alert and  oriented x4;  Skin:  Intact without significant lesions or rashes. Psych:  Alert and cooperative. Normal mood and affect.   Impression & Recommendations:  Problem # 1:  LEFT SIDED ULCERATIVE COLITIS (ICD-556.5) Left-sided ulcerative colitis. Improved on prednisone. Plan is to initiate  immunomodulating therapy. Normal TPMT. enzyme activity. We again discussed the rationale behind therapy. Our hope is to control his colitis while sparing steroids. We again reviewed the pharmacology of the drug as well as its potential side effects, including but not limited to hepatitis, pancreatitis, and serious bone marrow suppression. He understands and is interested in proceeding. His body weight is about 111 kg. Based on a dosage of 1.5 mg per kilogram we will start him on 6-mercaptopurine 150 mg daily.  Plan: #1. Decrease prednisone to 30 mg daily for 2 weeks then 20 mg daily until further notice #2. Continue Asacol #3. 6-MP 150 mg p.o. q. day. Prescription submitted #4. CBC q.2 weeks #5. Office followup in 6 weeks. Contact the office in the interim for questions or problems.  Problem # 2:  ANAL FISSURE (ICD-565.0) likely cause for new onset rectal pain with defecation. No hemorrhoids noted on colonoscopy recently.  Plan: #1. AnaMantle cream prescribed. To be used to 3 times daily p.r.n.  Patient Instructions: 1)  Anamantle Cream called into your pharmacy to pick up. 2)  6 MP 50 m g #90 take 3 tablets by mouth every morning #90 x 3 RFs. 3)  Decrease Prednisone to 30 mg x 2 weeks then take 76m until follow-up visit with Dr. PHenrene Pastorin 6 weeks. 4)  CBC q 2 weeks. 5)  Please schedule a follow-up appointment in  6 weeks.  6)  The medication list was reviewed and  reconciled.  All changed / newly prescribed medications were explained.  A complete medication list was provided to the patient / caregiver. Prescriptions: ANAMANTLE HC 3-2.5 % KIT (LIDOCAINE-HYDROCORTISONE ACE) apply two times a day as needed to rectum  #1 x 3   Entered by:   Randye Lobo NCMA   Authorized by:   Irene Shipper MD   Signed by:   Randye Lobo NCMA on 07/17/2009   Method used:   Electronically to        [facility] (retail)       [address], [place]  [address]        Ph: [phone]       Fax: [phone]   RxID:   [id number] MERCAPTOPURINE 50 MG TABS (MERCAPTOPURINE) take 3 tablets by mouth every morning  #90 x 3   Entered by:   Randye Lobo NCMA   Authorized by:   Irene Shipper MD   Signed by:   Randye Lobo NCMA on 07/17/2009   Method used:   Electronically to        [facility] (retail)       [address]       [place], [place]  [address]       Ph: [phone]       Fax: [phone]   RxID:   [id number]

## 2010-06-19 NOTE — Assessment & Plan Note (Signed)
Summary: Pulmonary/ persistent cough after uri    Primary Provider/Referring Provider:  [name], MD  CC:  Pt c/o cough x 2 weeks.  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   Admit 2/17 > 24/2010 admit with PE ok for coumadin by GI Henrene Pastor), presented with sob/ syncope   July 20, 2008 first post hosp fu for PE/ Left DVT ov no cp or sob, no rectal bleeding despite coumadin.   August 03, 2008--Returns for follow up post ER follow up for "near syncope" - got dizzy at work but states that BP and tests were normal at ED. CXR w/ chronic changes, card. enzymes neg. labs essentially unremarkable. INR 2.4. breathing returned to baseline.    August 17, 2008-- much better    October 21, 2008 ov c/o fatigue, sleepy in am no cp no  leg swelling,  feeling bad since placed on coumadin, much worse doe x  PE dx, no variablility > nl echo and venous and dopplers and stopped coumadin 11/03/08 > no change in symtoms then fell tripped and caught himself without hitting the floor but twisted back and pain in low back to left leg lateral to thigh. no numbness or weakness or urinary/ bowel symptoms. rec meloxicam and skelaxin helped some then saw olin changed  rx with motrin > resolved  December 21, 2008 ov main c/o is intermittent bowel urgency but no bleeding, doing better in general off coumadin.  February 16, 2009 Acute visit.  Pt c/o headaches and trouble sleeping x 2 wks.  Pt states that his ulverative colitis keeps him up all throughout the night.  Pt states that headaches occur on and off and usually go away without any meds, admits to drinking lots of coffee recently.  rec increase remeron to one pill and cut down on coffee  May 26, 2009 cc better until 2 weeks ago with cough> mucinex dm better until last night with severe nocturnal cough but no gag or vomit.  mostly dry.  Pt denies any  significant sore throat, dysphagia, itching, sneezing,  nasal congestion or excess secretions,  fever, chills, sweats, unintended wt loss, pleuritic or exertional cp, hempoptysis, change in activity tolerance  orthopnea pnd   Current Medications (verified): 1)  Mirtazapine 30 Mg  Tabs (Mirtazapine) .... One At Bedtime 2)  Doxazosin Mesylate 8 Mg  Tabs (Doxazosin Mesylate) .... Take One Half By Mouth At Bedtime 3)  Asacol 400 Mg  Tbec (Mesalamine) .... 4 Tablets Three Times A Day 4)  Aspirin 81 Mg Tabs (Aspirin) .... One Each Am 5)  Viagra 50 Mg Tabs (Sildenafil Citrate) .... One Daily As Needed 6)  Moviprep 100 Gm  Solr (Peg-Kcl-Nacl-Nasulf-Na Asc-C) .... As Per Prep Instructions. 7)  Delsym 30 Mg/55m Lqcr (Dextromethorphan Polistirex) .... Two Times A Day 8)  Mucinex Dm Maximum Strength 60-1200 Mg Xr12h-Tab (Dextromethorphan-Guaifenesin) .... Take 1 Tablet By Mouth Two Times A Day  Allergies (verified): No Known Drug Allergies  Past History:  Past Medical History: ULCERATIVE COLITIS................................................................................Marland Kitchenerry COLONIC POLYPS, HX OF (ICD-V12.72)     - colonoscopy 07/24/2007 HYPERLIPIDEMIA (ICD-272.4)         -  target LDL < 130 (pos HBP, male, former smoker) MORBID OBESITY (ICD-278.01)     -  ideal = 168  target 208 BENIGN POSITIONAL VERTIGO, HX OF (ICD-V12.49) HYPERTENSION (ICD-401.9)  Microsocopic Hematuria, neg w/u 1999 PULMONARY EMBOLISM  07/06/2008     - Left DVT by venous doppler 07/07/2008 (asymptomatic) > neg venous doppler 11/01/08     - nl ECHO 07/08/08 HEALTH MAINTENANCE..............................................................................Marland KitchenWert     - Pneumovax 06/20/2008     - Td 06/2002  Vital Signs:  Patient profile:   70 year old male Height:      72 inches Weight:      256 pounds O2 Sat:      93 % on Room air Temp:     98.6 degrees F oral Pulse rate:   85 / minute BP sitting:   118 / 72  (left arm) Cuff  size:   large  Vitals Entered By: Iran Planas CMA (May 26, 2009 2:16 PM)  O2 Flow:  Room air CC: Pt c/o cough x 2 weeks Comments Medications reviewed with patient Iran Planas [facility]  May 26, 2009 2:18 PM    Physical Exam  Additional Exam:  wt   265 June 28, 2008  >  251 July 20, 2008 >   258 November 08, 2008  > 260 12/21/08 > 256 February 18, 2009 > 256 May 27, 2009  Ambulatory  somber wm appearing in no acute distress. HEENT:  bottom teeth gone, upper partials in place  turbinates, and orophanx very very dry mucosa. nl limited fundoscopy Neck without JVD/Nodes/TM.  Lungs clear to A and P bilaterally without cough on insp or exp maneuvers RRR no s3 or murmur or increase in P2 No edema Abd soft obese no tenderness. Ext warm without calf tenderness, cyanosis clubbing or edema Neuro mood/affect flat, somber nl sensorium, nl gait, no focal sensory, motor, cerebellar deficits     CXR  Procedure date:  05/26/2009  Findings:        Comparison: 08/04/2008   Findings: Mild peribronchial thickening.  No confluent opacities or effusions.  Heart is normal size.  No acute bony abnormality.   IMPRESSION: Mild bronchitic changes.  Impression & Recommendations:  Problem # 1:  COUGH (CBS-496.2) Post uri noct cough prob Upper airway cough syndrome, so named because it's frequently impossible to sort out how much is LPR vs CR/sinusitis with freq throat clearing generating secondary extra esophageal GERD from wide swings in gastric pressure that occur with throat clearing, promoting self use of mint and menthol lozenges that reduce the lower esophageal sphincter tone and exacerbate the problem further.  These symptoms are easily confused with asthma/copd by even experienced pulmonogists because they overlap so much. These are the same pts who not infrequently have failed to tolerate ace inhibitors,  dry powder inhalers or biphosphonates or report having reflux symptoms that  don't respond to standard doses of PPI    Rec See instructions for specific recommendations   Medications Added to Medication List This Visit: 1)  Delsym 30 Mg/33m Lqcr (Dextromethorphan polistirex) .... Two times a day 2)  Mucinex Dm Maximum Strength 60-1200 Mg Xr12h-tab (Dextromethorphan-guaifenesin) .... Take 1 tablet by mouth two times a day 3)  Prednisone 10 Mg Tabs (Prednisone) .... 4 each am x 2days, 2x2days, 1x2days and stop 4)  Pepcid Ac Maximum Strength 20 Mg Tabs (Famotidine) .... One at bedtime whenever coughing  Other Orders: T-2 View CXR (71020TC) Est. Patient Level III ((75916  Patient Instructions: 1)  Prednsione 4 each am x 2days, 2x2days, 1x2days and stop  2)  Pepcid 20 mg one at bedtime whenever coughing for any reason 3)  GERD (REFLUX)  is a common cause of  respiratory symptoms. It commonly presents without heartburn and can be treated with medication, but also with lifestyle changes including avoidance of late meals, excessive alcohol, smoking cessation, and avoid fatty foods, chocolate, peppermint, colas, red wine, and acidic juices such as orange juice. NO MINT OR MENTHOL PRODUCTS SO NO COUGH DROPS  4)  USE SUGARLESS CANDY INSTEAD (jolley ranchers)  5)  NO OIL BASED VITAMINS  Prescriptions: PREDNISONE 10 MG  TABS (PREDNISONE) 4 each am x 2days, 2x2days, 1x2days and stop  #14 x 0   Entered and Authorized by:   Tanda Rockers MD   Signed by:   Tanda Rockers MD on 05/26/2009   Method used:   Electronically to        [facility] (retail)       [address]       [place], [place]  [address]       Ph: [phone]       Fax: [phone]   RxID:   [id number]    Immunization History:  Pneumovax Immunization History:    Pneumovax:  historical (06/20/2008)

## 2010-06-19 NOTE — Assessment & Plan Note (Signed)
Summary: 6-WEEK F/U APPT... Ulcerative colitis    History of Present Illness Visit Type: Follow-up Visit Primary GI MD: Scarlette Shorts MD Primary Provider: [name], MD Chief Complaint: 6 week follow-up, pt. c/o rectal burning after BMs, fatigue,  and blood in urine. History of Present Illness:   70 year old with hypertension, hyperlipidemia, remote pulmonary embolus, adenomatous colon polyps, and left-sided ulcerative colitis. He was last evaluated July 17, 2009. He was continued on Asacol 4.8 g daily. Prednisone taper begun. Mercaptopurine 150 mg daily initiated. Followup blood count March 28 with stable. He presents for clinical followup. He is currently on 20 mg of prednisone daily. She has been on that dose for 3 weeks. He has had significant improvement in his colitis. He reports one, occasionally two, for bowels daily. No blood, mucus, or urgency. No abdominal pain. He does tell me that he was diagnosed with microscopic hematuria for which he has an appointment with Dr. Gaynelle Arabian of urology. He has studied up on mercaptopurine and understands potential side effects. He denies any symptoms at this time and is pleased with his improvement. Problems with his anal fissure have improved.   GI Review of Systems      Denies abdominal pain, acid reflux, belching, bloating, chest pain, dysphagia with liquids, dysphagia with solids, heartburn, loss of appetite, nausea, vomiting, vomiting blood, weight loss, and  weight gain.      Reports rectal pain.     Denies anal fissure, black tarry stools, change in bowel habit, constipation, diarrhea, diverticulosis, fecal incontinence, heme positive stool, hemorrhoids, irritable bowel syndrome, jaundice, light color stool, liver problems, and  rectal bleeding.    Current Medications (verified): 1)  Mirtazapine 30 Mg  Tabs (Mirtazapine) .... One At Bedtime 2)  Doxazosin Mesylate 8 Mg  Tabs (Doxazosin Mesylate) .... Take One Half By Mouth At Bedtime 3)   Asacol 400 Mg  Tbec (Mesalamine) .... 4 Tablets Three Times A Day 4)  Aspirin 81 Mg Tabs (Aspirin) .... One Each Am (On Hold Until Blood in Stool Clears) 5)  Viagra 50 Mg Tabs (Sildenafil Citrate) .... One Daily As Needed 6)  Prednisone 20 Mg Tabs (Prednisone) .... Take 1 Tablet By Mouth Once Daily 7)  Unisom 25 Mg Tabs (Doxylamine Succinate (Sleep)) .... At Bedtime 8)  Mercaptopurine 50 Mg Tabs (Mercaptopurine) .... Take 3 Tablets By Mouth Every Morning 9)  Anamantle Hc 3-2.5 % Kit (Lidocaine-Hydrocortisone Ace) .... Apply Two Times A Day As Needed To Rectum  Allergies (verified): No Known Drug Allergies  Past History:  Past Medical History: Reviewed history from 08/03/2009 and no changes required. ULCERATIVE COLITIS...............................................................................Marland KitchenPerry + COLONIC POLYPS, HX OF (ICD-V12.72)     - colonoscopy 07/24/2007     - colonoscopy 06/08/09:  mucosal changes consistent with moderately severe     left-sided ulcerative colitis involving the rectum to distal  transverse colon confluent HYPERLIPIDEMIA (ICD-272.4)         -  target LDL < 130 (pos HBP, male, former smoker) MORBID OBESITY (ICD-278.01)     -  ideal = 168  target 208     -  Did not keep nutrition appt  07/04/09 BENIGN POSITIONAL VERTIGO, HX OF (ICD-V12.49) HYPERTENSION (ICD-401.9)  Microsocopic Hematuria, neg w/u 1999, resent to Urology August 03, 2009 >>> PULMONARY EMBOLISM 07/06/2008     - Left DVT by venous doppler 07/07/2008 (asymptomatic) > neg venous doppler 11/01/08     - nl ECHO 07/08/08  HEALTH MAINTENANCE..............................................................................Marland KitchenWert     - Pneumovax 06/20/2008     -  Td 06/2002     - CPX June 13, 2009   Past Surgical History: Reviewed history from 06/01/2008 and no changes required. Broken nose  Family History: Reviewed history from 06/23/2009 and no changes required. DM in mother, 2 paternal  Uncles inflammatory bowel dz, or premature heart dz Cancer: Father, unknown origion-? stomach Family History of Lung Cancer: 55 Brother No FH of Colon Cancer:  Social History: Reviewed history from 08/01/2009 and no changes required. Quit smoking 1995 x48yr, 1ppd Former MDatabase administratorAlcohol Use - yes- occ beer Illicit Drug Use - no Patient does not get regular exercise.  work fulltime- tArmed forces operational officer Review of Systems       The patient complains of blood in urine, fatigue, and urination changes/pain.  The patient denies allergy/sinus, anemia, anxiety-new, arthritis/joint pain, back pain, breast changes/lumps, change in vision, confusion, cough, coughing up blood, depression-new, fever, headaches-new, hearing problems, heart murmur, heart rhythm changes, itching, muscle pains/cramps, night sweats, nosebleeds, shortness of breath, skin rash, sleeping problems, sore throat, swelling of feet/legs, swollen lymph glands, thirst - excessive, urination - excessive, urine leakage, vision changes, and voice change.    Vital Signs:  Patient profile:   70year old male Height:      72 inches Weight:      274 pounds BMI:     37.30 Pulse rate:   92 / minute Pulse rhythm:   regular BP sitting:   138 / 78  (right arm)  Vitals Entered By: BRandye LoboNCMA (August 29, 2009 8:37 AM)  Physical Exam  General:  Well developed, well nourished, no acute distress. Head:  Normocephalic and atraumatic. Eyes:  PERRLA, no icterus. Lungs:  Clear throughout to auscultation. Heart:  Regular rate and rhythm; no murmurs, rubs,  or bruits. Abdomen:  Soft, obese,nontender and nondistended. No masses, hepatosplenomegaly or hernias noted. Normal bowel sounds. Pulses:  Normal pulses noted. Extremities:  no edema Neurologic:  Alert and  oriented x4;  Skin:  no jaundice Psych:  Alert and cooperative. Normal mood and affect.   Impression & Recommendations:  Problem # 1:  LEFT SIDED  ULCERATIVE COLITIS (ICD-556.5) improving on medical therapy. Now on mercaptopurine, mesalamine, and prednisone.  Plan: #1. Decrease prednisone to 10 mg daily for 3 weeks, and 5 mg daily for 3 weeks, then stop #2. Continue mercaptopurine and Asacol at current dosages #3. Continue CBC q.2 weeks #4. Routine GI followup in 2 months. Contact the office in the interim for questions or problems.  Problem # 2:  ANAL FISSURE (ICD-565.0) improved. Continue AnaMantle p.r.n.  Patient Instructions: 1)  Decrease Prednisone to 10 mg x 3 weeks. 2)  Decrease Prednisone to 5 mg x 3 weeks. 3)  Keep getting Blood Work every 2 weeks. 4)  Please schedule a follow-up appointment in  8 weeks.  5)  The medication list was reviewed and reconciled.  All changed / newly prescribed medications were explained.  A complete medication list was provided to the patient / caregiver. 6)  Printed and given to pt.  BRandye LoboNCMA  August 29, 2009 9:06 AM

## 2010-06-19 NOTE — Progress Notes (Signed)
Summary: pt. reminder labs   ---- Converted from flag ---- ---- 07/17/2009 9:38 AM, Randye Lobo NCMA wrote: Call Patient to remind him to get CBC drawn. ------------------------------ Phone Note Outgoing Call   Call placed by: Randye Lobo NCMA,  July 28, 2009 8:02 AM Call placed to: Patient Summary of Call: Called patient to remind him of his lab and he states he will come Tuesday to get it drawn. Initial call taken by: Randye Lobo NCMA,  July 28, 2009 8:03 AM

## 2010-06-19 NOTE — Letter (Signed)
Summary: Patient Notice- Polyp Results  [facility]  [address] [place], [place] [address]   Phone: [phone]  Fax: [phone]        June 10, 2009 MRN: [id number]    [address] [place], [place]  [address]    Dear [name],  I am pleased to inform you that the colon polyp(s) removed during your recent colonoscopy was (were) found to be benign (no cancer detected) upon pathologic examination.  The biopsies of the left colon showed signicant colitis (or inflammation of the colon).  I recommend you have a repeat colonoscopy examination in 2 years to look for recurrent polyps, as having colon polyps increases your risk for having recurrent polyps or even colon cancer in the future.  Should you develop new or worsening symptoms of abdominal pain, bowel habit changes or bleeding from the rectum or bowels, please schedule an evaluation with either your primary care physician or with me.  Additional information/recommendations:    __ If you haven't done so, please call [phone] to schedule a return visit to review your situation.   Please call us if you are having persistent problems or have questions about your condition that have not been fully answered at this time.  Sincerely,  Irene Shipper MD  This letter has been electronically signed by your physician.  Appended Document: Patient Notice- Polyp Results Letter mailed 1.25.11

## 2010-06-19 NOTE — Assessment & Plan Note (Signed)
Summary: Pulmonary/ ext ov - tick bite   Primary Provider/Referring Provider:  [name], MD  CC:  3 month followup.  Pt states pain in rt leg no better but no worse.  He states pain usually only bothers him in the am.  He c/o boil on his back x 2 days- not painful.  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   Admit 2/17 > 24/2010 admit with PE ok for coumadin by GI Henrene Pastor), presented with sob/ syncope   July 20, 2008 first post hosp fu for PE/ Left DVT ov no cp or sob, no rectal bleeding despite coumadin.  June 13, 2009 cpx,  no change in rx  Sep 26, 2009 cc coronary calcifications on ct of kidneys and rectal pain with bm's but no bleeding.    December 25, 2009--Presents for an acute office visit. Complains of burning in right thigh from knee to hip x3month progressively getting worse. Describes a burning sensation along outer/mid thigh w/ prickling sensation that comes and goes. On/off for last month. Has had in past before previously on both legs before. No back pain, ext weakness, rash , abdominal pain , known injury. He is working on weight loss, and has lost 10 lbs. He constantly keeps large set of keys in pocket along w/ alot of change. Imp was pinched nerve, rec loose wt, try advil  January 16, 2010 3 month followup.  Pt states pain in rt leg no better but no worse.  He states pain usually only bothers him in the am.  He c/o boil on his back x 2 days- not painful.  no fever, arthralgias, rash. Pt denies any significant sore throat, dysphagia, itching, sneezing,  nasal congestion or excess secretions,  fever, chills, sweats, unintended wt loss, pleuritic or exertional cp, hempoptysis, change in activity tolerance  orthopnea pnd or leg swelling   Current Medications (verified): 1)  Mirtazapine 30 Mg  Tabs (Mirtazapine) .... One At Bedtime 2)  Doxazosin Mesylate 8 Mg   Tabs (Doxazosin Mesylate) .... Take One Half By Mouth At Bedtime 3)  Asacol 400 Mg  Tbec (Mesalamine) .... 4 Tablets Three Times A Day 4)  Aspirin 81 Mg Tabs (Aspirin) .... One Each Am 5)  Viagra 50 Mg Tabs (Sildenafil Citrate) .... One Daily As Needed 6)  Unisom 25 Mg Tabs (Doxylamine Succinate (Sleep)) .... At Bedtime 7)  Mercaptopurine 50 Mg Tabs (Mercaptopurine) .... Take 3 Tablets By Mouth Every Morning  Allergies (verified): No Known Drug Allergies  Past History:  Past Medical History: ULCERATIVE COLITIS....................................................................................Marland Kitchenerry + COLONIC POLYPS, HX OF (ICD-V12.72)     - colonoscopy 07/24/2007     - colonoscopy 06/08/09:  mucosal changes consistent with moderately severe     left-sided ulcerative colitis involving the rectum to distal  transverse colon confluent HYPERLIPIDEMIA (ICD-272.4)         -  target LDL < 130 (pos HBP, male, former smoker) MORBID OBESITY (ICD-278.01)     -  ideal = 168  target 208     -  Did not keep nutrition appt  07/04/09 BENIGN POSITIONAL VERTIGO, HX OF (ICD-V12.49) HYPERTENSION (ICD-401.9)  Microsocopic Hematuria, neg w/u 1999, resent to Urology August 03, 2009 >>> negative PULMONARY EMBOLISM 07/06/2008     - Left DVT by venous doppler 07/07/2008 (asymptomatic) > neg venous doppler 11/01/08     - nl ECHO 07/08/08  HEALTH MAINTENANCE..............................................................................Marland KitchenWert     - Pneumovax 06/20/2008     - Td 06/2002     - CPX June 13, 2009   coronary calcifications ? significance 08/2009  Vital Signs:  Patient profile:   70 year old male Weight:      264 pounds O2 Sat:      93 % on Room air Temp:     97.8 degrees F oral Pulse rate:   93 / minute BP sitting:   122 / 70  (left arm) Cuff size:   large  Vitals Entered By: Tilden Dome (January 16, 2010 9:39 AM)  O2 Flow:  Room air  Physical Exam  Additional Exam:  wt   265 June 28, 2008   > 260 06/13/09 > 278 Sep 26, 2009 >>269 December 25, 2009> wt  264 January 16, 2010  Ambulatory  somber wm appearing in no acute distress. HEENT:  bottom teeth gone, upper partials in place, and orophanx clear Neck without JVD/Nodes/TM.  Lungs clear to A and P bilaterally without cough on insp or exp maneuvers RRR no s3 or murmur or increase in P2 No edema Abd soft obese no tenderness.-large panniculus.  Ext warm without calf tenderness, cyanosis clubbing or edema Neuro mood/affect flat, somber nl sensorium, nl gait, no focal sensory, motor, cerebellar deficits noted, SLR neg. equal strength,   DERM:  imbedded tick right post chest wall     Impression & Recommendations:  Problem # 1:  LEG PAIN (ICD-729.5)  c/w lat fem cutaneous nerve syndrome, advised lose wt, loosen belt  Orders: Est. Patient Level IV (74734)  Problem # 2:  TICK BITE (ICD-E906.4)  since embedded higher risk of ricketsial infection, doxy rx  Orders: Est. Patient Level IV (03709)  Problem # 3:  HYPERTENSION (ICD-401.9)  His updated medication list for this problem includes:    Doxazosin Mesylate 8 Mg Tabs (Doxazosin mesylate) .Marland Kitchen... Take one half by mouth at bedtime  ok on rx   Each maintenance medication was reviewed in detail including most importantly the difference between maintenance and as needed and under what circumstances the prns are to be used. See instructions for specific recommendations   Orders: Est. Patient Level IV (64383)  Medications Added to Medication List This Visit: 1)  Aspirin 81 Mg Tabs (Aspirin) .... One each am 2)  Doxycycline Hyclate 100 Mg Caps (Doxycycline hyclate) .... One twice daily before meals x 10 days take a large glass of water no milk  Patient Instructions: 1)  If fever, aches, rash > start doxy x 10 days 2)  Return to office in 3 months, sooner if needed  Prescriptions: DOXYCYCLINE HYCLATE 100 MG CAPS (DOXYCYCLINE HYCLATE) one twice daily before meals x 10 days take  a large glass of water no milk  #20 x 0   Entered and Authorized by:   Tanda Rockers MD   Signed by:   Tanda Rockers MD on 01/16/2010   Method used:   Print then Give to Patient   RxID:   [id number]

## 2010-06-19 NOTE — Progress Notes (Signed)
Summary: Asacol Refil   Phone Note Call from Patient Call back at [facility] Phone [phone]   Call For: Dr Henrene Pastor Reason for Call: Refill Medication Summary of Call: Asacol takes 4 3 x aday. Uses [facility].  Initial call taken by: Irwin Brakeman [facility],  June 15, 2009 8:16 AM    Prescriptions: ASACOL 400 MG  TBEC (MESALAMINE) 4 tablets three times a day  #360 x 11   Entered by:   Randye Lobo NCMA   Authorized by:   Irene Shipper MD   Signed by:   Randye Lobo NCMA on 06/15/2009   Method used:   Electronically to        [facility] (retail)       [address]       [place], [place]  [address]       Ph: [phone]       Fax: [phone]   RxID:   [id number]

## 2010-06-19 NOTE — Progress Notes (Signed)
Summary: reminder labs   ---- Converted from flag ---- ---- 08/29/2009 9:07 AM, Randye Lobo NCMA wrote: Call patient to remind him it is time for labs. ------------------------------  Phone Note Outgoing Call   Call placed by: Randye Lobo [place],  September 07, 2009 12:56 PM Call placed to: Patient Summary of Call: Called patient to remind him it is time for labs.   Initial call taken by: Randye Lobo [place],  September 07, 2009 12:57 PM

## 2010-06-19 NOTE — Progress Notes (Signed)
Summary: nos appt  Phone Note Call from Patient   Caller: [email or web address]  Call For: wert Summary of Call: ATC pt to rsc nos from 11/28, no vm. Initial call taken by: Netta Neat,  April 17, 2010 3:57 PM

## 2010-06-19 NOTE — Progress Notes (Signed)
Summary: Triage / rectal pain   Phone Note Call from Patient Call back at Home Phone [phone]   Caller: Patient Call For: Dr. Henrene Pastor Reason for Call: Talk to Nurse Summary of Call: Burning sensation inside the colon....Pt. said he cannot wait until his next appt. Initial call taken by: Webb Laws,  Sep 26, 2009 3:52 PM  Follow-up for Phone Call        Pt. stiil has rectal burning and says it burns like fire when he has a b.m. Says the only thing that helps is the Anucort [facility] suppositories Dr Olevia Perches prescribed on Good Friday.Rx. was for 12 with 1 refill which he picked up on on 09/22/09.Says his insurance will only pay for one rx. a month so that is all he can get until 10/23/09.I sent him brochure on anal fissure and he is going to use Tucks during the day as he feels he can get by with that. Want's to know what can be done to heal the area? Pt. aware the dr. is out of the office until Monday. Follow-up by: Abel Presto RN,  Sep 26, 2009 4:57 PM  Additional Follow-up for Phone Call Additional follow up Details #1::        Anamantle, sitz, etc. keep f/u w/ me. if persists may need surgical eval Additional Follow-up by: Irene Shipper MD,  Sep 28, 2009 9:18 AM    Additional Follow-up for Phone Call Additional follow up Details #2::    Message  says phone # no longer working     Abel Presto RN  Sep 28, 2009 1:54 PM message still says phone not working and his workplace could not contact him   Abel Presto RN  Sep 28, 2009 3:45 PM  Talked with pt's wife as he is not available and she wii give him the message about DrPerry's orders. Follow-up by: Abel Presto RN,  Sep 28, 2009 11:56 AM

## 2010-06-19 NOTE — Assessment & Plan Note (Signed)
Summary: 2 WK PROCEDURE F-UP (ulcerative colitis)   History of Present Illness Visit Type: Follow-up Visit Primary GI MD: Scarlette Shorts MD Primary Provider: [name], MD Chief Complaint: "feeling like crap", a lot of diarrhea, 4-5 loose BM's/day that get firmer as the day goes by, also having a lot of gas History of Present Illness:   70 year old with hypertension, hyperlipidemia, history of pulmonary embolus, history of colon polyps, and chronic left-sided ulcerative colitis. He presents today for followup regarding management of the latter. He has been on Asacol 4.8 g daily for some time as well as intermittent treatment of flares with topical therapy. He recently underwent colonoscopy on June 08, 2009. He was found to have moderately severe left-sided ulcerative colitis from the rectum to distal transverse colon. Biopsies were consistent with the same. No dysplasia. He was also noted to have 2 sporadic adenomas in the proximal colon that has never had colitis. He presents now for followup. He continues on Asacol. He states he feels poorly. Increased frequency of bowel movements, diarrhea, and increased intestinal gas. Some bleeding. Review of outside laboratories from January 25 reveal normal liver function tests. Albumin 3.4. Elevated serum glucose at 110. Anemia with a hemoglobin of 12.6. He has been on prednisone in the past. Side effects including insomnia and increased appetite.   GI Review of Systems      Denies abdominal pain, acid reflux, belching, bloating, chest pain, dysphagia with liquids, dysphagia with solids, heartburn, loss of appetite, nausea, vomiting, vomiting blood, weight loss, and  weight gain.      Reports change in bowel habits, diarrhea, and  rectal bleeding.     Denies anal fissure, black tarry stools, constipation, diverticulosis, fecal incontinence, heme positive stool, hemorrhoids, irritable bowel syndrome, jaundice, light color stool, liver problems, and  rectal  pain.    Current Medications (verified): 1)  Mirtazapine 30 Mg  Tabs (Mirtazapine) .... One At Bedtime 2)  Doxazosin Mesylate 8 Mg  Tabs (Doxazosin Mesylate) .... Take One Half By Mouth At Bedtime 3)  Asacol 400 Mg  Tbec (Mesalamine) .... 4 Tablets Three Times A Day 4)  Aspirin 81 Mg Tabs (Aspirin) .... One Each Am (On Hold Until Blood in Stool Clears) 5)  Viagra 50 Mg Tabs (Sildenafil Citrate) .... One Daily As Needed  Allergies (verified): No Known Drug Allergies  Past History:  Past Medical History: Reviewed history from 06/13/2009 and no changes required. ULCERATIVE COLITIS...............................................................................Marland KitchenPerry + COLONIC POLYPS, HX OF (ICD-V12.72)     - colonoscopy 07/24/2007     - colonoscopy 06/08/09:  mucosal changes consistent with moderately severe     left-sided ulcerative colitis involving the rectum to distal  transverse colon confluent HYPERLIPIDEMIA (ICD-272.4)         -  target LDL < 130 (pos HBP, male, former smoker) MORBID OBESITY (ICD-278.01)     -  ideal = 168  target 208 BENIGN POSITIONAL VERTIGO, HX OF (ICD-V12.49) HYPERTENSION (ICD-401.9)  Microsocopic Hematuria, neg w/u 1999 PULMONARY EMBOLISM 07/06/2008     - Left DVT by venous doppler 07/07/2008 (asymptomatic) > neg venous doppler 11/01/08     - nl ECHO 07/08/08 HEALTH MAINTENANCE..............................................................................Marland KitchenWert     - Pneumovax 06/20/2008     - Td 06/2002     - CPX June 13, 2009   Past Surgical History: Reviewed history from 06/01/2008 and no changes required. Broken nose  Family History: DM in mother, 2 paternal Uncles inflammatory bowel dz, or premature heart dz Cancer: Father, unknown origion-? 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  work fulltime- Armed forces operational officer  Review of Systems       back pain. All other systems reviewed and reported to be negative  Vital Signs:  Patient profile:   70 year old male Height:      72 inches Weight:      253 pounds BMI:     34.44 Pulse rate:   92 / minute Pulse rhythm:   regular BP sitting:   124 / 78  (left arm) Cuff size:   regular  Vitals Entered By: Lincoln Deborra Medina) (June 23, 2009 9:14 AM)  Physical Exam  General:  Well developed, well nourished, no acute distress. Head:  Normocephalic and atraumatic. Eyes:  PERRLA, no icterus. Nose:  No deformity, discharge,  or lesions. Mouth:  No deformity or lesions,  Neck:  Supple; no masses or thyromegaly. Lungs:  Clear throughout to auscultation. Heart:  Regular rate and rhythm; no murmurs, rubs,  or bruits. Abdomen:  Soft, obese,nontender and nondistended. No masses, hepatosplenomegaly or hernias noted. Normal bowel sounds. Msk:  Symmetrical with no gross deformities. Normal posture. Pulses:  Normal pulses noted. Extremities:  No  edema or deformities noted. Neurologic:  Alert and  oriented x4;  Skin:  Intact without significant lesions or rashes. Psych:  Alert and cooperative. Normal mood and affect.   Impression & Recommendations:  Problem # 1:  LEFT SIDED ULCERATIVE COLITIS (ICD-556.5) Moderately severe left-sided ulcerative colitis despite mesalamine therapy. We had a long discussion today regarding treatment options. We discussed in great detail the role of immunomodulators therapy. I discussed with him potential side effects including, but not limited to, bone marrow suppression, increased risk of infection, drug-induced hepatitis, and drug-induced hepatitis. We discussed the rationale behind therapy and the need to monitor blood work regularly. Greater than one half of today's visit was spent counseling the patient.  Plan: #1. Initiate prednisone 40 mg daily. Prescription  submitted #2. The patient was provided with literature on immunomodulators therapy, 6-MP. This should be reviewed carefully before the next office visit. #3. TPMT phenotype to be obtained #4. Office followup in 2-3 weeks #5. Continue Asacol  Problem # 2:  INSOMNIA, TRANSIENT (ICD-780.52) insomnia related to prednisone  Plan: #1. Ambien 10 mg p.o. q.h.s. p.r.n. prescribed  Problem # 3:  COLONIC POLYPS, HX OF (ICD-V12.72) adenomatous colon polyps on recent colonoscopy. Sporadic adenomas in a region non-involved with colitis.  Plan: #1. Follow up in 2 years  Other Orders: TPMT [facility] ([facility] [id number]) [phone])  Patient Instructions: 1)  Labs for patient to have drawn today. 2)  Prednisone Rx. 20 mg take 2 tablets by mouth once daily #100 x 2 RFs. 3)  Ambien 10 mg #30 1 by mouth at bedtime as needed sleep #30x 2 RFS. 4)  Literature on 6 MP given to patient . 5)  Please schedule a follow-up appointment in 3 weeks.  6)  The medication list was reviewed and reconciled.  All changed / newly prescribed medications were explained.  A complete medication list was provided to the patient / caregiver. 7)  Copy: Dr. [name] Prescriptions: AMBIEN 10 MG TABS (ZOLPIDEM TARTRATE) 1 tablet by mouth at bedtime as needed sleep  #30 x 2   Entered by:   Randye Lobo NCMA  Authorized by:   Irene Shipper MD   Signed by:   Randye Lobo NCMA on 06/23/2009   Method used:   Print then Give to Patient   RxID:   [id number] PREDNISONE 20 MG TABS (PREDNISONE) take 2 tablets by mouth once daily  #100 x 2   Entered by:   Randye Lobo NCMA   Authorized by:   Irene Shipper MD   Signed by:   Randye Lobo NCMA on 06/23/2009   Method used:   Electronically to        [facility] (retail)       [address]       [place], [place]  [address]       Ph: [phone]       Fax: [phone]   RxID:   [id number]

## 2010-06-19 NOTE — Progress Notes (Signed)
Summary: TRIAGE / RECTAL CREAM   Phone Note Call from Patient Call back at Work Phone [phone]   Caller: Patient Call For: Dr. Henrene Pastor Reason for Call: Talk to Nurse Summary of Call: pt's pharmacy doesnt have cream, pt doesnt recall med name, and pt wants to know if there is a similar cream Initial call taken by: Lucien Mons,  September 15, 2009 12:26 PM  Follow-up for Phone Call        Pt. has Ulcerative Colitis and a rectal fissure.  Pt. c/o severe rectal burning, intermittent for 2 monthes. Usually burns after a BM, now it burns constantly.  Denies blood, black stools, constipation, diarrhea, fever. Pt. states his pharmacy can't supply Anamantle ointment, which he states is the only thing that helps the burning. I spoke with the Pharmacist, no generic equal is available from the manufacturer, they only have brand name which will cost pt. $200 or more.  They will [facility] to [facility] the [facility], if it comes it will be in on Monday.   1) Use TUCKS pads 3-4 times daily 2) Try an OTC ointment  3) Continue daily fiber 4) Sitz bath three times a day  5) If symptoms become worse call back immediately. 6) I will call pt., if new orders, after MD reviews.  Follow-up by: Vivia Ewing LPN,  September 16, 8467 4:31 PM

## 2010-06-19 NOTE — Progress Notes (Signed)
Summary: rx cream   Phone Note Call from Patient Call back at Home Phone [phone] Call back at Work Phone [phone]   Caller: Patient Call For: Dr. Henrene Pastor Reason for Call: Talk to Nurse Summary of Call: pt reports that cream Dr. Henrene Pastor rx'ed is "not helping at all" and he is still experiencing burning when he has a BM Initial call taken by: Lucien Mons,  July 20, 2009 8:43 AM  Follow-up for Phone Call        1.Sitz bath two times a day. 2. metamucil 2 tsp / 12 oz water daily (not with meds) 3. Cont anamantle Follow-up by: Irene Shipper MD,  July 20, 2009 9:37 AM  Additional Follow-up for Phone Call Additional follow up Details #1::         Pt. notified of Dr.Perry's orders. Additional Follow-up by: Abel Presto RN,  July 20, 2009 11:28 AM

## 2010-06-19 NOTE — Assessment & Plan Note (Signed)
Summary: Followup left-sided colitis and anal fissure    History of Present Illness Visit Type: Follow-up Visit Primary GI MD: Scarlette Shorts MD Primary Provider: [name], MD Chief Complaint: diarrhea, burning in anus; suppositories help History of Present Illness:   70 year old male with hypertension, hyperlipidemia, history of pulmonary embolus, adenomatous colon polyps, and left-sided ulcerative colitis. He was last seen in the office August 29, 2009 for management of his colitis. See that dictation. He has since tapered off prednisone as instructed. He has continued 6-mercaptopurine and Asacol without change. His CBCs have been stable including his most recent on May 2. He was having problems with anal fissure and was prescribed AnaMantle HC. This helped though cannot obtain from his pharmacy due to insurance formulary preference. He continues to complain of anal burning and pain with and without defecation. He has been using sitz baths sporadically as well as Desitin cream sporadically no bleeding. In terms of his colitis, he seems to be doing well. He describes his bowel habits is normal. There are soft and formed without blood or mucus and normal frequency of one or 2 bowel movements daily. He is tolerating his medications well. He is helpful come off Asacol at some point. He is using Tucks pads for his rectal area.   GI Review of Systems      Denies abdominal pain, acid reflux, belching, bloating, chest pain, dysphagia with liquids, dysphagia with solids, heartburn, loss of appetite, nausea, vomiting, vomiting blood, weight loss, and  weight gain.      Reports diarrhea and  rectal pain.     Denies anal fissure, black tarry stools, change in bowel habit, constipation, diverticulosis, fecal incontinence, heme positive stool, hemorrhoids, irritable bowel syndrome, jaundice, light color stool, liver problems, and  rectal bleeding.    Current Medications (verified): 1)  Mirtazapine 30 Mg   Tabs (Mirtazapine) .... One At Bedtime 2)  Doxazosin Mesylate 8 Mg  Tabs (Doxazosin Mesylate) .... Take One Half By Mouth At Bedtime 3)  Asacol 400 Mg  Tbec (Mesalamine) .... 4 Tablets Three Times A Day 4)  Aspirin 81 Mg Tabs (Aspirin) .... One Each Am (On Hold Until Blood in Stool Clears) 5)  Viagra 50 Mg Tabs (Sildenafil Citrate) .... One Daily As Needed 6)  Unisom 25 Mg Tabs (Doxylamine Succinate (Sleep)) .... At Bedtime 7)  Mercaptopurine 50 Mg Tabs (Mercaptopurine) .... Take 3 Tablets By Mouth Every Morning 8)  Tucks 51 % Supp (Starch) .... Two Times A Day  Allergies (verified): No Known Drug Allergies  Past History:  Past Medical History: Reviewed history from 09/26/2009 and no changes required. ULCERATIVE COLITIS.................................................................................Marland KitchenPerry + COLONIC POLYPS, HX OF (ICD-V12.72)     - colonoscopy 07/24/2007     - colonoscopy 06/08/09:  mucosal changes consistent with moderately severe     left-sided ulcerative colitis involving the rectum to distal  transverse colon confluent HYPERLIPIDEMIA (ICD-272.4)         -  target LDL < 130 (pos HBP, male, former smoker) MORBID OBESITY (ICD-278.01)     -  ideal = 168  target 208     -  Did not keep nutrition appt  07/04/09 BENIGN POSITIONAL VERTIGO, HX OF (ICD-V12.49) HYPERTENSION (ICD-401.9)  Microsocopic Hematuria, neg w/u 1999, resent to Urology August 03, 2009 >>> negative PULMONARY EMBOLISM 07/06/2008     - Left DVT by venous doppler 07/07/2008 (asymptomatic) > neg venous doppler 11/01/08     - nl ECHO 07/08/08  HEALTH MAINTENANCE..............................................................................Marland KitchenWert     -  Pneumovax 06/20/2008     - Td 06/2002     - CPX June 13, 2009   coronary calcifications ? significance 08/2009  Past Surgical History: Reviewed history from 06/01/2008 and no changes required. Broken nose  Family History: Reviewed history from 06/23/2009 and no  changes required. DM in mother, 2 paternal Uncles inflammatory bowel dz, or premature heart dz Cancer: Father, unknown origion-? stomach Family History of Lung Cancer: 13 Brother No FH of Colon Cancer:  Social History: Reviewed history from 08/01/2009 and no changes required. 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  work fulltime- tArmed forces operational officer Review of Systems       The patient complains of back pain.  The patient denies allergy/sinus, anemia, anxiety-new, arthritis/joint pain, blood in urine, breast changes/lumps, change in vision, confusion, cough, coughing up blood, depression-new, fainting, fatigue, fever, headaches-new, hearing problems, heart murmur, heart rhythm changes, itching, menstrual pain, muscle pains/cramps, night sweats, nosebleeds, pregnancy symptoms, shortness of breath, skin rash, sleeping problems, sore throat, swelling of feet/legs, swollen lymph glands, thirst - excessive, urination - excessive, urination changes/pain, urine leakage, vision changes, and voice change.    Vital Signs:  Patient profile:   70year old male Height:      72 inches Weight:      278.38 pounds BMI:     37.89 Pulse rate:   100 / minute Pulse rhythm:   regular BP sitting:   128 / 80  (left arm) Cuff size:   large  Vitals Entered By: June McMurray CPritchett(Deborra Medina (October 18, 2009 8:26 AM)  Physical Exam  General:  Well developed, well nourished, no acute distress. Head:  Normocephalic and atraumatic. Eyes:  PERRLA, no icterus. Mouth:  No deformity or lesions Lungs:  Clear throughout to auscultation. Heart:  Regular rate and rhythm; no murmurs, rubs,  or bruits. Abdomen:  Soft,obese, nontender and nondistended. No masses, hepatosplenomegaly or hernias noted. Normal bowel sounds. Rectal:  tears have a tender posterior fissure as well as a sentinel tag. Also question of  draining fistula, though somewhat difficult to assess with his body habitus. Pulses:  Normal pulses noted. Extremities:  no edema Neurologic:  alert and oriented Skin:  no jaundice Psych:  Alert and cooperative. Normal mood and affect.   Impression & Recommendations:  Problem # 1:  LEFT SIDED ULCERATIVE COLITIS (ICD-556.5) Left-sided ulcerative colitis. Currently asymptomatic off prednisone and on Asacol as well as recently started 6-mercaptopurine.  Plan: #1. Continue 6 mercaptopurine 150 mg daily and Asacol 1.6 g t.i.d. #2. Change CBCs to q.4 weeks #3. Follow up in 6 weeks. Contact the office in the interim for questions or problems  Problem # 2:  ANAL FISSURE (ICD-565.0) still with significant symptoms. Question associated fistula  Plans: #1. Sitz baths diligently once or twice daily #2. Apply Desitin cream rarely at night #3. Prescribe diltiazem 2% cream to be applied to the anal area 5 times daily #4. Prescribe metronidazole 5 mg b.i.d. x2 weeks #5. If problem persists despite compliance with these measures, consider surgical referral. Discussed with patient  Patient Instructions: 1)  [facility]  2)  Metronidazole 500 mg 1 by mouth two times a day #28 [facility] 3)  Have CBC drawn today on basement floor. 4)  Please schedule a follow-up  appointment in 6 to 8 weeks.  5)  The medication list was reviewed and reconciled.  All changed / newly prescribed medications were explained.  A complete medication list was provided to the patient / caregiver. Prescriptions: METRONIDAZOLE 500 MG TABS (METRONIDAZOLE) 1 by mouth two times a day  #28 x 0   Entered by:   Randye Lobo NCMA   Authorized by:   Irene Shipper MD   Signed by:   Irene Shipper MD on 10/18/2009   Method used:   Telephoned to ...       [facility] (retail)       [address] [place], [place]  [id number]        Ph: [phone]       Fax: [phone]   RxID:   [id number] DILTIAZEM HCL  POWD (DILTIAZEM HCL) 2 % apply five times per day  #1 x 3   Entered by:   Randye Lobo NCMA   Authorized by:   Irene Shipper MD   Signed by:   Irene Shipper MD on 10/18/2009   Method used:   Telephoned to ...       [facility] (retail)       [address] [place], [place]  [id number]       Ph: [phone]       Fax: [phone]   RxID:   [id number]

## 2010-06-19 NOTE — Progress Notes (Signed)
Summary: Triage   Phone Note Call from Patient Call back at Home Phone [phone]   Caller: Patient Call For: Dr. Henrene Pastor Reason for Call: Talk to Nurse Summary of Call: pt. wants to know if he is suppose to come in this week for labwork Initial call taken by: Webb Laws,  Oct 02, 2009 9:31 AM  Follow-up for Phone Call        Message left fot pt. that next labs are due on 10/19/09. Follow-up by: Abel Presto RN,  Oct 02, 2009 10:33 AM

## 2010-06-19 NOTE — Letter (Signed)
Summary: No Show/[facility]  No Show/[facility]   Imported By: Phillis Knack 07/07/2009 10:11:08  _____________________________________________________________________  External Attachment:    Type:   Image     Comment:   External Document

## 2010-06-19 NOTE — Progress Notes (Signed)
Summary: Lab Resutls   Phone Note Call from Patient Call back at Work Phone [phone] Call back at [facility]   Call For: DR Cyla Haluska Reason for Call: Lab or Test Results Initial call taken by: Irwin Brakeman [facility],  August 04, 2009 8:56 AM  Follow-up for Phone Call        Pt notified of CBC results and given appt to recheck in 2 weeks. Follow-up by: Alberteen Spindle RN,  August 04, 2009 10:29 AM

## 2010-06-19 NOTE — Assessment & Plan Note (Signed)
Summary: NP follow up - ear wash   Primary Provider/Referring Provider:  [name], MD  CC:  bilateral ear wash per Dr. Melvyn Novas at last [place].  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   Admit 2/17 > 24/2010 admit with PE ok for coumadin by GI Henrene Pastor), presented with sob/ syncope   July 20, 2008 first post hosp fu for PE/ Left DVT ov no cp or sob, no rectal bleeding despite coumadin.   August 03, 2008--Returns for follow up post ER follow up for "near syncope" - got dizzy at work but states that BP and tests were normal at ED. CXR w/ chronic changes, card. enzymes neg. labs essentially unremarkable. INR 2.4. breathing returned to baseline.    August 17, 2008-- much better    October 21, 2008 ov c/o fatigue, sleepy in am no cp no  leg swelling,  feeling bad since placed on coumadin, much worse doe x  PE dx, no variablility > nl echo and venous and dopplers and stopped coumadin 11/03/08 > no change in symtoms then fell tripped and caught himself without hitting the floor but twisted back and pain in low back to left leg lateral to thigh. no numbness or weakness or urinary/ bowel symptoms. rec meloxicam and skelaxin helped some then saw olin changed  rx with motrin > resolved  December 21, 2008 ov main c/o is intermittent bowel urgency but no bleeding, doing better in general off coumadin.  February 16, 2009 Acute visit.  Pt c/o headaches and trouble sleeping x 2 wks.  Pt states that his ulverative colitis keeps him up all throughout the night.  Pt states that headaches occur on and off and usually go away without any meds, admits to drinking lots of coffee recently.  rec increase remeron to one pill and cut down on coffee  May 26, 2009 cc better until 2 weeks ago with cough> mucinex dm better until last night with severe nocturnal cough but no gag or vomit.  mostly dry. rec Prednsione 4  each am x 2days, 2x2days, 1x2days and stop  Pepcid 20 mg one at bedtime whenever coughing for any reason  next pageJanuary 25, 2011 cpx,  cough resolved.  Pt denies any significant sore throat, dysphagia, itching, sneezing,  nasal congestion or excess secretions,  fever, chills, sweats, unintended wt loss, pleuritic or exertional cp, hempoptysis, change in activity tolerance  orthopnea pnd or leg swelling.  August 01, 2009--Presents for bilateral ear wash. Ear stopped up , wax noted at last ov. Unable to get out at home. Labs last visit showed microscopic hematuria. He has not seen any blood in urine. Has had flare of Ulcerative Colitis, currently seeing Dr. Henrene Pastor. He is feeling better. Denies chest pain, dyspnea, orthopnea, hemoptysis, fever, n/v/d, edema, headache,, dysuria, urinary frequency, abd pain or weight loss. No previous urology evaluations.   Medications Prior to Update: 1)  Mirtazapine 30 Mg  Tabs (Mirtazapine) .... One At Bedtime 2)  Doxazosin Mesylate 8 Mg  Tabs (Doxazosin Mesylate) .... Take One Half By Mouth At Bedtime 3)  Asacol 400 Mg  Tbec (Mesalamine) .... 4 Tablets Three Times A Day 4)  Aspirin 81 Mg Tabs (Aspirin) .... One Each Am (On Hold Until Blood in Stool Clears) 5)  Viagra 50 Mg Tabs (Sildenafil Citrate) .... One Daily As Needed 6)  Prednisone 20 Mg Tabs (Prednisone) .Marland KitchenMarland KitchenMarland Kitchen  Take 2 Tablets By Mouth Once Daily 7)  Unisom 25 Mg Tabs (Doxylamine Succinate (Sleep)) .... At Bedtime 8)  Mercaptopurine 50 Mg Tabs (Mercaptopurine) .... Take 3 Tablets By Mouth Every Morning 9)  Anamantle Hc 3-2.5 % Kit (Lidocaine-Hydrocortisone Ace) .... Apply Two Times A Day As Needed To Rectum  Current Medications (verified): 1)  Mirtazapine 30 Mg  Tabs (Mirtazapine) .... One At Bedtime 2)  Doxazosin Mesylate 8 Mg  Tabs (Doxazosin Mesylate) .... Take One Half By Mouth At Bedtime 3)  Asacol 400 Mg  Tbec (Mesalamine) .... 4 Tablets Three Times A Day 4)  Aspirin 81 Mg Tabs (Aspirin) .... One Each  Am (On Hold Until Blood in Stool Clears) 5)  Viagra 50 Mg Tabs (Sildenafil Citrate) .... One Daily As Needed 6)  Prednisone 20 Mg Tabs (Prednisone) .... Take 2 Tablets By Mouth Once Daily 7)  Unisom 25 Mg Tabs (Doxylamine Succinate (Sleep)) .... At Bedtime 8)  Mercaptopurine 50 Mg Tabs (Mercaptopurine) .... Take 3 Tablets By Mouth Every Morning 9)  Anamantle Hc 3-2.5 % Kit (Lidocaine-Hydrocortisone Ace) .... Apply Two Times A Day As Needed To Rectum  Allergies (verified): No Known Drug Allergies  Past History:  Family History: Last updated: 06/23/2009 DM in mother, 2 paternal Uncles inflammatory bowel dz, or premature heart dz Cancer: Father, unknown origion-? stomach Family History of Lung Cancer: 75 Brother No FH of Colon Cancer:  Social History: Last updated: 08/01/2009 Quit smoking 1995 x31yr, 1ppd Former MDatabase administratorAlcohol Use - yes- occ beer Illicit Drug Use - no Patient does not get regular exercise.  work fulltime- tArmed forces operational officer Risk Factors: Exercise: no (06/01/2008)  Risk Factors: Smoking Status: quit (07/20/2008)  Past Medical History: ULCERATIVE COLITIS................................................................................Marland Kitchenerry + COLONIC POLYPS, HX OF (ICD-V12.72)     - colonoscopy 07/24/2007     - colonoscopy 06/08/09:  mucosal changes consistent with moderately severe     left-sided ulcerative colitis involving the rectum to distal  transverse colon confluent HYPERLIPIDEMIA (ICD-272.4)         -  target LDL < 130 (pos HBP, male, former smoker) MORBID OBESITY (ICD-278.01)     -  ideal = 168  target 208     -  Did not keep nutrition appt  07/04/09 BENIGN POSITIONAL VERTIGO, HX OF (ICD-V12.49) HYPERTENSION (ICD-401.9)  Microsocopic Hematuria, neg w/u 1999 PULMONARY EMBOLISM 07/06/2008     - Left DVT by venous doppler 07/07/2008 (asymptomatic) > neg venous doppler 11/01/08     - nl ECHO 07/08/08 MICROSCOPIC HEMATURIA 05/2009       ->> HEALTH MAINTENANCE...............................................................................Marland Kitchenert     - Pneumovax 06/20/2008     - Td 06/2002     - CPX June 13, 2009   Social History: Quit smoking 1995 x334yr 1ppd Former MaDatabase administratorlcohol Use - yes- occ beer Illicit Drug Use - no Patient does not get regular exercise.  work fulltime- toArmed forces operational officerReview of Systems      See HPI  Vital Signs:  Patient profile:   89ear old male Height:      72 inches Weight:      276 pounds BMI:     37.57 O2 Sat:      96 % on Room air Temp:     97.3 degrees F oral Pulse rate:   89 / minute BP sitting:   124 / 84  (left arm) Cuff size:   regular  Vitals Entered By: JeParke PoissonNA (August 01, 2009 9:27 AM)  O2 Flow:  Room air CC: bilateral ear wash per Dr. Melvyn Novas at last [place] Is Patient Diabetic? No Comments Medications reviewed with patient Daytime contact number verified with patient. Parke Poisson CNA  August 01, 2009 9:28 AM    Physical Exam  Additional Exam:  wt   265 June 28, 2008  >  251 July 20, 2008   > 256 February 18, 2009 > 256 May 27, 2009 > 260 06/13/09 Ambulatory  somber wm appearing in no acute distress. HEENT:  bottom teeth gone, upper partials in place,  bilateral R > R turbinate edema, and orophanx clear,  L ear one half wax impaction  Neck without JVD/Nodes/TM.  Lungs clear to A and P bilaterally without cough on insp or exp maneuvers RRR no s3 or murmur or increase in P2 No edema Abd soft obese no tenderness. Ext warm without calf tenderness, cyanosis clubbing or edema Neuro mood/affect flat, somber nl sensorium, nl gait, no focal sensory, motor, cerebellar deficits noted     Impression & Recommendations:  Problem # 1:  MICROSCOPIC HEMATURIA (ICD-599.72) Wil recheck UA and cx today.  if cont will refer to urology.  Orders: TLB-Udip w/ Micro (81001-URINE) T-Urine Culture (Spectrum Order) [id number]) Est. Patient Level IV  (91478)  Problem # 2:  HYPERTENSION (ICD-401.9)  Controlled on rx.   His updated medication list for this problem includes:    Doxazosin Mesylate 8 Mg Tabs (Doxazosin mesylate) .Marland Kitchen... Take one half by mouth at bedtime  BP today: 124/84 Prior BP: 132/80 (07/17/2009)  Labs Reviewed: K+: 3.9 (06/13/2009) Creat: : 0.9 (06/13/2009)   Chol: 149 (06/13/2009)   HDL: 34.10 (06/13/2009)   LDL: 96 (06/13/2009)   TG: 93.0 (06/13/2009)  Orders: Est. Patient Level IV (29562)  Problem # 3:  CERUMEN IMPACTION (ICD-380.4)  Ear irrigation w/ wax removal . Tolerated well  may use debrox as needed   Orders: Est. Patient Level IV (13086)  Complete Medication List: 1)  Mirtazapine 30 Mg Tabs (Mirtazapine) .... One at bedtime 2)  Doxazosin Mesylate 8 Mg Tabs (Doxazosin mesylate) .... Take one half by mouth at bedtime 3)  Asacol 400 Mg Tbec (Mesalamine) .... 4 tablets three times a day 4)  Aspirin 81 Mg Tabs (Aspirin) .... One each am (on hold until blood in stool clears) 5)  Viagra 50 Mg Tabs (Sildenafil citrate) .... One daily as needed 6)  Prednisone 20 Mg Tabs (Prednisone) .... Take 2 tablets by mouth once daily 7)  Unisom 25 Mg Tabs (Doxylamine succinate (sleep)) .... At bedtime 8)  Mercaptopurine 50 Mg Tabs (Mercaptopurine) .... Take 3 tablets by mouth every morning 9)  Anamantle Hc 3-2.5 % Kit (Lidocaine-hydrocortisone ace) .... Apply two times a day as needed to rectum  Patient Instructions: 1)  I will call with lab results.  2)  May use debrox for ear wax.  3)  follow up Dr. Melvyn Novas in 1 month as scheduled.  4)  Please contact office for sooner follow up if symptoms do not improve or worsen

## 2010-06-19 NOTE — Progress Notes (Signed)
Summary: Lab results   Phone Note Call from Patient Call back at Home Phone [phone]   Caller: Patient Call For: Dr. Henrene Pastor Reason for Call: Lab or Test Results Summary of Call: Calling about Lab results Initial call taken by: Webb Laws,  January 26, 2010 11:50 AM  Follow-up for Phone Call        Attempted to reach pt. and no answer and no messge phone. Abel Presto RN  January 26, 2010 2:10 PM Daughter to give him message to call back.Has o.v. appt. on9/14/11 Follow-up by: Abel Presto RN,  January 29, 2010 10:59 AM

## 2010-06-19 NOTE — Progress Notes (Signed)
Summary: rectal bleeding   Phone Note Call from Patient Call back at Work Phone [phone]   Caller: Patient Call For: Dr. Henrene Pastor Reason for Call: Talk to Nurse Summary of Call: pt reporting that when he gets up in the morning and has a BM, he passes "quite a bit" of blood... this x 4-5 days... will be available at work number after 10AM Initial call taken by: Lucien Mons,  July 27, 2009 9:14 AM  Follow-up for Phone Call         Pt. having bright red rectal bleeding past 4-5 days in the am with stool. Says it is more than a teaspoon but doesn't know if it's a half of cup.Just knows the toilet water is very red. Says  the rectal burning is improving with Anamantle. Denies hard stools or straining.Says he just started 6MP on March 1st asking if this is the cause? Follow-up by: Abel Presto RN,  July 27, 2009 10:37 AM  Additional Follow-up for Phone Call Additional follow up Details #1::        no 65m is not the cause. Continue the same plan as outlined in the office. call back if he feels like the colitis is flaring (if so,  we may need to increase prednisone) Additional Follow-up by: JIrene ShipperMD,  July 27, 2009 11:27 AM    Additional Follow-up for Phone Call Additional follow up Details #2::    Pt. informed of Dr.Perry's orders. He is currently on Prednisone 30 mg. and will decrease to 20 mg. tomorrow. Follow-up by: CAbel PrestoRN,  July 27, 2009 11:33 AM

## 2010-06-19 NOTE — Assessment & Plan Note (Signed)
Summary: Primary svc/ f/u hbp/ hyperlipidemia, coronary calcifications    Primary Provider/Referring Provider:  [name], MD  CC:  Followup.  Pt wants to discuss recent ct scan abd/pelvis.  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   Admit 2/17 > 24/2010 admit with PE ok for coumadin by GI Henrene Pastor), presented with sob/ syncope   July 20, 2008 first post hosp fu for PE/ Left DVT ov no cp or sob, no rectal bleeding despite coumadin.   June 13, 2009 cpx,  no change in rx  Sep 26, 2009 cc coronary calcifications on ct of kidneys and rectal pain with bm's but no bleeding. Pt denies any significant sore throat, dysphagia, itching, sneezing,  nasal congestion or excess secretions,  fever, chills, sweats, unintended wt loss, pleuritic or exertional cp, hempoptysis, change in activity tolerance  orthopnea pnd or leg swelling.    Current Medications (verified): 1)  Mirtazapine 30 Mg  Tabs (Mirtazapine) .... One At Bedtime 2)  Doxazosin Mesylate 8 Mg  Tabs (Doxazosin Mesylate) .... Take One Half By Mouth At Bedtime 3)  Asacol 400 Mg  Tbec (Mesalamine) .... 4 Tablets Three Times A Day 4)  Aspirin 81 Mg Tabs (Aspirin) .... One Each Am (On Hold Until Blood in Stool Clears) 5)  Viagra 50 Mg Tabs (Sildenafil Citrate) .... One Daily As Needed 6)  Unisom 25 Mg Tabs (Doxylamine Succinate (Sleep)) .... At Bedtime 7)  Mercaptopurine 50 Mg Tabs (Mercaptopurine) .... Take 3 Tablets By Mouth Every Morning  Allergies (verified): No Known Drug Allergies  Past History:  Past Medical History: ULCERATIVE COLITIS.................................................................................Marland KitchenPerry + COLONIC POLYPS, HX OF (ICD-V12.72)     - colonoscopy 07/24/2007     - colonoscopy 06/08/09:  mucosal changes consistent with moderately severe  left-sided ulcerative colitis involving the rectum to distal  transverse colon confluent HYPERLIPIDEMIA (ICD-272.4)         -  target LDL < 130 (pos HBP, male, former smoker) MORBID OBESITY (ICD-278.01)     -  ideal = 168  target 208     -  Did not keep nutrition appt  07/04/09 BENIGN POSITIONAL VERTIGO, HX OF (ICD-V12.49) HYPERTENSION (ICD-401.9)  Microsocopic Hematuria, neg w/u 1999, resent to Urology August 03, 2009 >>> negative PULMONARY EMBOLISM 07/06/2008     - Left DVT by venous doppler 07/07/2008 (asymptomatic) > neg venous doppler 11/01/08     - nl ECHO 07/08/08  HEALTH MAINTENANCE..............................................................................Marland KitchenWert     - Pneumovax 06/20/2008     - Td 06/2002     - CPX June 13, 2009   coronary calcifications ? significance 08/2009  Vital Signs:  Patient profile:   70 year old male Weight:      278 pounds O2 Sat:      93 % on Room air Temp:     98.9 degrees F oral Pulse rate:   103 / minute BP sitting:   124 / 68  (left arm) Cuff size:   large  Vitals Entered By: Tilden Dome (Sep 26, 2009 3:17 PM)  O2 Flow:  Room air  Physical Exam  Additional Exam:  wt   265 June 28, 2008  >  251 July 20, 2008   > 256 February 18, 2009 > 256 May 27, 2009 > 260 06/13/09 > 278 Sep 26, 2009  Ambulatory  somber wm appearing in no acute distress. HEENT:  bottom teeth gone, upper partials in place,  bilateral R > R turbinate edema, and orophanx clear,  L ear one half wax impaction  Neck without JVD/Nodes/TM.  Lungs clear to A and P bilaterally without cough on insp or exp maneuvers RRR no s3 or murmur or increase in P2 No edema Abd soft obese no tenderness. Ext warm without calf tenderness, cyanosis clubbing or edema Neuro mood/affect flat, somber nl sensorium, nl gait, no focal sensory, motor, cerebellar deficits noted     Impression & Recommendations:  Problem # 1:  HYPERLIPIDEMIA (ICD-272.4)  Labs Reviewed: SGOT: 18  (06/13/2009)   SGPT: 17 (06/13/2009)   HDL:34.10 (06/13/2009), 25.1 (07/02/2007)  LDL:96 (06/13/2009), 105 (07/02/2007)  Chol:149 (06/13/2009), 146 (07/02/2007)  Trig:93.0 (06/13/2009), 80 (07/02/2007)  I had an extended discussion with the patient today lasting 15 to 20 minutes of a 25 minute visit on the following issues:   Coronary calcifications noted and ideally need LDL < 70 with lots more attn to wt control. discussed symptoms of ischemia and rec at least a baby aspirn  per day if tolerates from GI perspective.  Orders: Est. Patient Level IV (84784)  Problem # 2:  HYPERTENSION (ICD-401.9)  His updated medication list for this problem includes:    Doxazosin Mesylate 8 Mg Tabs (Doxazosin mesylate) .Marland Kitchen... Take one half by mouth at bedtime  Patient Instructions: 1)  Keep follow up with Dr Henrene Pastor for your colitis 2)  The calcifications of your coronary are a concern and ideally you should be on one baby aspirin daily if tolerated  and control your risk factors (blood pressure, cholesterol and the amt of physical activity) and report any new chest pain or shortness of brreath 3)  Return to office in 3 months, sooner if needed

## 2010-06-19 NOTE — Consult Note (Signed)
Summary: [facility]   Imported By: Phillis Knack 08/30/2009 08:20:20  _____________________________________________________________________  External Attachment:    Type:   Image     Comment:   External Document

## 2010-06-19 NOTE — Letter (Signed)
Summary: Office Visit Letter  [facility]  [address] [place], [place] [address]   Phone: [phone]  Fax: [phone]      March 09, 2010 MRN: [id number]   SAHIB PELLA [address] [place], [place]  [address]   Dear Mr. ZAGAL,   According to our records, it is time for you to schedule a follow-up office visit with [place] in the month of Dec 2011.   At your convenience, please call [phone] (option #2)to schedule an office visit. If you have any questions, concerns, or feel that this letter is in error, we would appreciate your call.   Sincerely,  Docia Chuck. Henrene Pastor, M.D.  [facility] [phone]

## 2010-06-19 NOTE — Progress Notes (Signed)
Summary: Was seen today   Phone Note Call from Patient Call back at Home Phone [phone]   Call For: Dr Henrene Pastor Summary of Call: Was seen today- forgot to ask nurse one question Initial call taken by: Irwin Brakeman [facility],  July 17, 2009 11:42 AM  Follow-up for Phone Call        callled patient and he wanted to know if he continues Asacol or d/c?  Also asked if milk products will cause UC to flare.  Please advise and I will call patient back with answer.   Follow-up by: Randye Lobo [place],  July 17, 2009 2:03 PM  Additional Follow-up for Phone Call Additional follow up Details #1::        continue Asacol. . Milk products okay  Pt. notified of the above.  Randye Lobo [facility]  July 17, 2009 2:28 PM  Additional Follow-up by: Irene Shipper MD,  July 17, 2009 2:19 PM

## 2010-06-19 NOTE — Progress Notes (Signed)
Summary: pt. reminder for labs   ---- Converted from flag ---- ---- 07/17/2009 9:38 AM, Randye Lobo NCMA wrote: Call patient to remind him to have CBC drawn. ------------------------------  Phone Note Outgoing Call Call back at [facility] Phone [phone]   Call placed by: Randye Lobo [place],  August 11, 2009 3:06 PM Call placed to: Patient Summary of Call: Called to remind patient that it is time for labs.  L/M on voicemail.   Initial call taken by: Randye Lobo NCMA,  August 11, 2009 3:07 PM

## 2010-06-19 NOTE — Progress Notes (Signed)
Summary: Triage / burning w/ bm and urination   Phone Note Call from Patient Call back at Home Phone [phone]   Caller: Patient Call For: Dr. Henrene Pastor Reason for Call: Talk to Nurse Summary of Call: pt. said the rx Mercaptopurine is causing a burning feeling when he urinates Initial call taken by: Webb Laws,  September 11, 2009 8:48 AM  Follow-up for Phone Call        Called Dr.on call on week-end says PA called him back and ordered Anucort-hc supp .1 hs. and says does help him at night. but when he has a bm the pain is severe. Says stools are soft.Also has burning when he urinates and he feels symptoms are from the 6MP and is asking if the dose can be reduced as he said he did not have these symptoms prior to starting 1m. Follow-up by: CAbel PrestoRN,  September 11, 2009 9:49 AM  Additional Follow-up for Phone Call Additional follow up Details #1::        It is not the 6MP. He likely has a fissure. Cont w/ suppositories. See a urologist for dysuria Additional Follow-up by: JIrene ShipperMD,  September 11, 2009 11:34 AM    Additional Follow-up for Phone Call Additional follow up Details #2::    Pt. ntfd. of Dr.Bryse Blanchette's order's Follow-up by: CAbel PrestoRN,  September 11, 2009 11:59 AM

## 2010-06-19 NOTE — Assessment & Plan Note (Signed)
Summary: Acute NP office visit - right leg pain   Primary Provider/Referring Provider:  [name], MD  CC:  burning in right thigh from knee to hip x77monthand progressively getting worse.  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   Admit 2/17 > 24/2010 admit with PE ok for coumadin by GI (Henrene Pastor, presented with sob/ syncope   July 20, 2008 first post hosp fu for PE/ Left DVT ov no cp or sob, no rectal bleeding despite coumadin.  June 13, 2009 cpx,  no change in rx  Sep 26, 2009 cc coronary calcifications on ct of kidneys and rectal pain with bm's but no bleeding.    December 25, 2009--Presents for an acute office visit. Complains of burning in right thigh from knee to hip x169monthprogressively getting worse. Describes a burning sensation along outer/mid thigh w/ prickling sensation that comes and goes. On/off for last month. Has had in past before previously on both legs before. No back pain, ext weakness, rash , abdominal pain , known injury. He is working on weight loss, and has lost 10 lbs. He constantly keeps large set of keys in pocket along w/ alot of change.   Medications Prior to Update: 1)  Mirtazapine 30 Mg  Tabs (Mirtazapine) .... One At Bedtime 2)  Doxazosin Mesylate 8 Mg  Tabs (Doxazosin Mesylate) .... Take One Half By Mouth At Bedtime 3)  Asacol 400 Mg  Tbec (Mesalamine) .... 4 Tablets Three Times A Day 4)  Aspirin 81 Mg Tabs (Aspirin) .... One Each Am (On Hold Until Blood in Stool Clears) 5)  Viagra 50 Mg Tabs (Sildenafil Citrate) .... One Daily As Needed 6)  Unisom 25 Mg Tabs (Doxylamine Succinate (Sleep)) .... At Bedtime 7)  Mercaptopurine 50 Mg Tabs (Mercaptopurine) .... Take 3 Tablets By Mouth Every Morning 8)  Tucks 51 % Supp (Starch) .... Two Times A Day 9)  Diltiazem Hcl  Powd (Diltiazem Hcl) .... 2 % Apply Five Times Per Day 10)  Metronidazole  500 Mg Tabs (Metronidazole) ...Marland Kitchen 1 By Mouth Two Times A Day  Current Medications (verified): 1)  Mirtazapine 30 Mg  Tabs (Mirtazapine) .... One At Bedtime 2)  Doxazosin Mesylate 8 Mg  Tabs (Doxazosin Mesylate) .... Take One Half By Mouth At Bedtime 3)  Asacol 400 Mg  Tbec (Mesalamine) .... 4 Tablets Three Times A Day 4)  Aspirin 81 Mg Tabs (Aspirin) .... One Each Am (On Hold Until Blood in Stool Clears) 5)  Viagra 50 Mg Tabs (Sildenafil Citrate) .... One Daily As Needed 6)  Unisom 25 Mg Tabs (Doxylamine Succinate (Sleep)) .... At Bedtime 7)  Mercaptopurine 50 Mg Tabs (Mercaptopurine) .... Take 3 Tablets By Mouth Every Morning  Allergies (verified): No Known Drug Allergies  Past History:  Past Medical History: Last updated: 09/26/2009 ULCERATIVE COLITIS..................................................................................PMarland Kitchenrry + COLONIC POLYPS, HX OF (ICD-V12.72)     - colonoscopy 07/24/2007     - colonoscopy 06/08/09:  mucosal changes consistent with moderately severe     left-sided ulcerative colitis involving the rectum to distal  transverse colon confluent HYPERLIPIDEMIA (ICD-272.4)         -  target LDL < 130 (pos HBP, male, former smoker) MORBID OBESITY (ICD-278.01)     -  ideal = 168  target 208     -  Did not keep nutrition appt  07/04/09 BENIGN POSITIONAL VERTIGO, HX OF (ICD-V12.49) HYPERTENSION (  ICD-401.9)  Microsocopic Hematuria, neg w/u 1999, resent to Urology August 03, 2009 >>> negative PULMONARY EMBOLISM 07/06/2008     - Left DVT by venous doppler 07/07/2008 (asymptomatic) > neg venous doppler 11/01/08     - nl ECHO 07/08/08  HEALTH MAINTENANCE..............................................................................Marland KitchenWert     - Pneumovax 06/20/2008     - Td 06/2002     - CPX June 13, 2009   coronary calcifications ? significance 08/2009  Past Surgical History: Last updated: 06/01/2008 Broken nose  Family History: Last updated: 06/23/2009 DM in mother,  2 paternal Uncles inflammatory bowel dz, or premature heart dz Cancer: Father, unknown origion-? 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  work fulltime- tArmed forces operational officer Risk Factors: Exercise: no (06/01/2008)  Risk Factors: Smoking Status: quit (07/20/2008)  Review of Systems      See HPI  Vital Signs:  Patient profile:   70year old male Height:      72 inches Weight:      269.25 pounds BMI:     36.65 O2 Sat:      98 % on Room air Temp:     98.8 degrees F oral Pulse rate:   94 / minute BP sitting:   134 / 82  (left arm) Cuff size:   regular  Vitals Entered By: JParke PoissonCNA/MA (December 25, 2009 3:28 PM)  O2 Flow:  Room air CC: burning in right thigh from knee to hip x181monthprogressively getting worse Is Patient Diabetic? No Comments Medications reviewed with patient Daytime contact number verified with patient. JeParke PoissonNA/MA  December 25, 2009 3:28 PM    Physical Exam  Additional Exam:  wt   265 June 28, 2008  >  251 July 20, 2008   > 256 February 18, 2009 > 256 May 27, 2009 > 260 06/13/09 > 278 Sep 26, 2009 >>269 December 25, 2009  Ambulatory  somber wm appearing in no acute distress. HEENT:  bottom teeth gone, upper partials in place, and orophanx clear Neck without JVD/Nodes/TM.  Lungs clear to A and P bilaterally without cough on insp or exp maneuvers RRR no s3 or murmur or increase in P2 No edema Abd soft obese no tenderness.-large panniculus.  Ext warm without calf tenderness, cyanosis clubbing or edema Neuro mood/affect flat, somber nl sensorium, nl gait, no focal sensory, motor, cerebellar deficits noted, SLR neg. equal strength,   DERM: no rash noted.      Impression & Recommendations:  Problem # 1:   LEG PAIN (ICD-729.5)  Right lateral thigh pain w/ ? neuropathy -meralgia paraesthetica  REC:  Ibuprofen 20049m tabs two times a day w/ food for 5 days Avoid placing objects in pants pockets.  Avoid sitting for prolonged time-get up walk around for few minutes Wear looser clothing , avoid tightness around stomach/groin.  Weight loss is key.  Please contact office for sooner follow up if symptoms do not improve or worsen  follow up Dr. WerMelvyn Novas 1 month and as needed   Orders: Est. Patient Level III ([phone] Medication List: 1)  Mirtazapine 30 Mg Tabs (Mirtazapine) .... One at bedtime 2)  Doxazosin Mesylate 8 Mg Tabs (Doxazosin mesylate) .... Take one half by mouth  at bedtime 3)  Asacol 400 Mg Tbec (Mesalamine) .... 4 tablets three times a day 4)  Aspirin 81 Mg Tabs (Aspirin) .... One each am (on hold until blood in stool clears) 5)  Viagra 50 Mg Tabs (Sildenafil citrate) .... One daily as needed 6)  Unisom 25 Mg Tabs (Doxylamine succinate (sleep)) .... At bedtime 7)  Mercaptopurine 50 Mg Tabs (Mercaptopurine) .... Take 3 tablets by mouth every morning  Patient Instructions: 1)  Ibuprofen 229m 3 tabs two times a day w/ food for 5 days 2)  Avoid placing objects in pants pockets.  3)  Avoid sitting for prolonged time-get up walk around for few minutes 4)  Wear looser clothing , avoid tightness around stomach/groin.  5)  Weight loss is key.  6)  Please contact office for sooner follow up if symptoms do not improve or worsen  7)  follow up Dr. WMelvyn Novasin 1 month and as needed

## 2010-06-19 NOTE — Procedures (Signed)
Summary: Colonoscopy  Patient: Bill Fox Note: All result statuses are Final unless otherwise noted.  Tests: (1) Colonoscopy (COL)   COL Colonoscopy           [facility].     [place], [place]  [address]           COLONOSCOPY PROCEDURE REPORT           PATIENT:  Bill, Fox  MR#:  [id number]     BIRTHDATE:  [birth year], 68 yrs. old  GENDER:  male           ENDOSCOPIST:  Docia Chuck. Geri Seminole, MD     Referred by:  Office           PROCEDURE DATE:  06/08/2009     PROCEDURE:  Colonoscopy with biopsy,     Colonoscopy with snare polypectomy           ASA CLASS:  Class II     INDICATIONS:  surveillance, evaluation of ulcerative colitis ;left     sided (was initially proctitis in '96)           MEDICATIONS:   Fentanyl 50 mcg IV, Versed 5 mg IV           DESCRIPTION OF PROCEDURE:   After the risks benefits and     alternatives of the procedure were thoroughly explained, informed     consent was obtained.  Digital rectal exam was performed and     revealed no abnormalities.   The LB CF-[id number] endoscope     was introduced through the anus and advanced to the cecum, which     was identified by both the appendix and ileocecal valve, without     limitations. Time to cecum = 2:07 min.The quality of the prep was     Moviprep fair.  The instrument was then withdrawn (time = 16:14     min) as the colon was fully examined.     <<PROCEDUREIMAGES>>           FINDINGS:  The colonic mucosa from the cecum to mid tranverse     colon was normal. Two polyps were found (26m cecal and 134m    ascending) and snared without cautery. Retrieval was successful.     There were mucosal changes consistent with moderately severe     left-sided ulcerative colitis involving the rectum to distal     transverse colon confluently (see photos). Multiple biopsies     taken.   Retroflexed views in the rectum revealed mild proctitis.     The scope was then withdrawn  from the patient and the procedure     completed.           COMPLICATIONS:  None           ENDOSCOPIC IMPRESSION:     1) Two polyps (proximal colon) -removed     2) Colitis - left sided UC  (see report)           RECOMMENDATIONS:     1) call office next 1-3 days to schedule followup visit in a few     weeks     2) Routine repeat colonoscopy in 2 years likely           ______________________________     JoDocia ChuckPeGeri SeminoleMD           CC:  MiTanda Rockers  MD; The Patient           n.     eSIGNED:   Docia Chuck. Geri Seminole at 06/08/2009 12:58 PM           [name], [id number]  Note: An exclamation mark (!) indicates a result that was not dispersed into the flowsheet. Document Creation Date: 06/08/2009 12:57 PM _______________________________________________________________________  (1) Order result status: Final Collection or observation date-time: 06/08/2009 12:42 Requested date-time:  Receipt date-time:  Reported date-time:  Referring Physician:   Ordering Physician: Lavena Bullion [id number]) Specimen Source:  Source: Tawanna Cooler Order Number: [id number] Lab site:   Appended Document: Colonoscopy     Procedures Next Due Date:    Colonoscopy: 05/2011

## 2010-06-19 NOTE — Assessment & Plan Note (Signed)
Summary: Primary svc/ cpx   Primary Provider/Referring Provider:  [name], MD  CC:  CPX. 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   Admit 2/17 > 24/2010 admit with PE ok for coumadin by GI Bill Fox), presented with sob/ syncope   July 20, 2008 first post hosp fu for PE/ Left DVT ov no cp or sob, no rectal bleeding despite coumadin.   August 03, 2008--Returns for follow up post ER follow up for "near syncope" - got dizzy at work but states that BP and tests were normal at ED. CXR w/ chronic changes, card. enzymes neg. labs essentially unremarkable. INR 2.4. breathing returned to baseline.    August 17, 2008-- much better    October 21, 2008 ov c/o fatigue, sleepy in am no cp no  leg swelling,  feeling bad since placed on coumadin, much worse doe x  PE dx, no variablility > nl echo and venous and dopplers and stopped coumadin 11/03/08 > no change in symtoms then fell tripped and caught himself without hitting the floor but twisted back and pain in low back to left leg lateral to thigh. no numbness or weakness or urinary/ bowel symptoms. rec meloxicam and skelaxin helped some then saw Bill Fox changed  rx with motrin > resolved  December 21, 2008 ov main c/o is intermittent bowel urgency but no bleeding, doing better in general off coumadin.  February 16, 2009 Acute visit.  Pt c/o headaches and trouble sleeping x 2 wks.  Pt states that his ulverative colitis keeps him up all throughout the night.  Pt states that headaches occur on and off and usually go away without any meds, admits to drinking lots of coffee recently.  rec increase remeron to one pill and cut down on coffee  May 26, 2009 cc better until 2 weeks ago with cough> mucinex dm better until last night with severe nocturnal cough but no gag or vomit.  mostly dry. rec Prednsione 4 each am x 2days, 2x2days,  1x2days and stop  Pepcid 20 mg one at bedtime whenever coughing for any reason  next pageJanuary 25, 2011 cpx,  cough resolved.  Pt denies any significant sore throat, dysphagia, itching, sneezing,  nasal congestion or excess secretions,  fever, chills, sweats, unintended wt loss, pleuritic or exertional cp, hempoptysis, change in activity tolerance  orthopnea pnd or leg swelling.  Current Medications (verified): 1)  Mirtazapine 30 Mg  Tabs (Mirtazapine) .... One At Bedtime 2)  Doxazosin Mesylate 8 Mg  Tabs (Doxazosin Mesylate) .... Take One Half By Mouth At Bedtime 3)  Asacol 400 Mg  Tbec (Mesalamine) .... 4 Tablets Three Times A Day 4)  Aspirin 81 Mg Tabs (Aspirin) .... One Each Am 5)  Viagra 50 Mg Tabs (Sildenafil Citrate) .... One Daily As Needed 6)  Mucinex Dm Maximum Strength 60-1200 Mg Xr12h-Tab (Dextromethorphan-Guaifenesin) .... Take 1 Tablet By Mouth Two Times A Day 7)  Pepcid Ac Maximum Strength 20 Mg Tabs (Famotidine) .... One At Bedtime Whenever Coughing  Allergies (verified): No Known Drug Allergies  Past History:  Past Medical History: ULCERATIVE COLITIS...............................................................................Marland KitchenPerry + COLONIC POLYPS, HX OF (ICD-V12.72)     - colonoscopy 07/24/2007     - colonoscopy 06/08/09:  mucosal changes consistent with moderately severe     left-sided ulcerative colitis involving the rectum to distal  transverse colon confluent HYPERLIPIDEMIA (ICD-272.4)         -  target LDL < 130 (pos HBP, male, former smoker) MORBID OBESITY (ICD-278.01)     -  ideal = 168  target 208 BENIGN POSITIONAL VERTIGO, HX OF (ICD-V12.49) HYPERTENSION (ICD-401.9)  Microsocopic Hematuria, neg w/u 1999 PULMONARY EMBOLISM 07/06/2008     - Left DVT by venous doppler 07/07/2008 (asymptomatic) > neg venous doppler 11/01/08     - nl ECHO 07/08/08 HEALTH MAINTENANCE..............................................................................Marland KitchenWert     -  Pneumovax 06/20/2008     - Td 06/2002     - CPX June 13, 2009   Family History: Reviewed history from 06/01/2008 and no changes required. DM in mother, 2 paternal Uncles inflammatory bowel dz, or premature heart dz Cancer: Father, unknown origion Family History of Lung Cancer: Half Brother  Social History: Reviewed history from 08/03/2008 and no changes required. 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  work fulltime- Armed forces operational officer  Vital Signs:  Patient profile:   70 year old male Weight:      260 pounds O2 Sat:      93 % on Room air Temp:     98.1 degrees F oral Pulse rate:   115 / minute BP sitting:   118 / 78  (left arm)  Vitals Entered By: Bill Fox (June 13, 2009 8:47 AM)  O2 Flow:  Room air  Physical Exam  Additional Exam:  wt   265 June 28, 2008  >  251 July 20, 2008   > 256 February 18, 2009 > 256 May 27, 2009 > 260 06/13/09 Ambulatory  somber wm appearing in no acute distress. HEENT:  bottom teeth gone, upper partials in place,  bilateral R > R turbinate edema, and orophanx clear,  L ear one half wax impaction Neck without JVD/Nodes/TM.  Lungs clear to A and P bilaterally without cough on insp or exp maneuvers RRR no s3 or murmur or increase in P2 No edema Abd soft obese no tenderness. Ext warm without calf tenderness, cyanosis clubbing or edema Neuro mood/affect flat, somber nl sensorium, nl gait, no focal sensory, motor, cerebellar deficits    Cholesterol               149 mg/dL                   0-200     ATP III Classification            Desirable:  < 200 mg/dL                    Borderline High:  200 - 239 mg/dL               High:  > = 240 mg/dL   Triglycerides             93.0 mg/dL                  0.0-149.0     Normal:  <150 mg/dL     Borderline High:  150 - 199 mg/dL   HDL                  [L]  34.10  mg/dL                 >39.00   VLDL Cholesterol          18.6 mg/dL  0.0-40.0   LDL Cholesterol           96 mg/dL                    0-99  CHO/HDL Ratio:  CHD Risk                             4                    Men          Women     1/2 Average Risk     3.4          3.3     Average Risk          5.0          4.4     2X Average Risk          9.6          7.1     3X Average Risk          15.0          11.0                           Tests: (2) TSH (TSH)   FastTSH                   2.55 uIU/mL                 0.35-5.50  Tests: (3) Hepatic/Liver Function Panel (HEPATIC)   Total Bilirubin           0.5 mg/dL                   0.3-1.2   Direct Bilirubin          0.0 mg/dL                   0.0-0.3   Alkaline Phosphatase      72 U/L                      39-117   AST                       18 U/L                      0-37   ALT                       17 U/L                      0-53   Total Protein             8.3 g/dL                    6.0-8.3   Albumin              [L]  3.4 g/dL                    3.5-5.2  Tests: (4) BMP (METABOL)   Sodium                    140 mEq/L  135-145   Potassium                 3.9 mEq/L                   3.5-5.1   Chloride                  105 mEq/L                   96-112   Carbon Dioxide            28 mEq/L                    19-32   Glucose              [H]  110 mg/dL                   70-99   BUN                       10 mg/dL                    6-23   Creatinine                0.9 mg/dL                   0.4-1.5   Calcium                   9.2 mg/dL                   8.4-10.5   GFR                       89.67 mL/min                >60  Tests: (5) CBC Platelet w/Diff (CBCD)   White Cell Count          7.4 K/uL                    4.5-10.5   Red Cell Count            4.91 Mil/uL                 4.22-5.81   Hemoglobin           [L]  12.6 g/dL                   13.0-17.0   Hematocrit                39.6 %                       39.0-52.0   MCV                       80.7 fl                     78.0-100.0   MCHC                      31.9 g/dL                   30.0-36.0   RDW  14.6 %                      11.5-14.6   Platelet Count            216.0 K/uL                  150.0-400.0   Neutrophil %              44.1 %                      43.0-77.0   Lymphocyte %              37.5 %                      12.0-46.0   Monocyte %           [H]  13.0 %                      3.0-12.0   Eosinophils%              4.5 %                       0.0-5.0   Basophils %               0.9 %                       0.0-3.0   Neutrophill Absolute      3.2 K/uL                    1.4-7.7   Lymphocyte Absolute       2.8 K/uL                    0.7-4.0   Monocyte Absolute         1.0 K/uL                    0.1-1.0  Eosinophils, Absolute                             0.3 K/uL                    0.0-0.7   Basophils Absolute        0.1 K/uL                    0.0-0.1  Tests: (6) UDip w/Micro (URINE)   Color                     YELLOW       RANGE:  Yellow;Lt. Yellow   Clarity                   CLEAR                       Clear   Specific Gravity          >=1.030                     1.000 - 1.030   Urine Ph                  5.5  5.0-8.0   Protein                   NEGATIVE                    Negative   Urine Glucose             NEGATIVE                    Negative   Ketones                   NEGATIVE                    Negative   Urine Bilirubin           NEGATIVE                    Negative   Blood                     MODERATE                    Negative   Urobilinogen              0.2                         0.0 - 1.0   Leukocyte Esterace        NEGATIVE                    Negative   Nitrite                   NEGATIVE                    Negative   Urine RBC                 3-6/hpf                     0-2/hpf   Urine Mucus               Presence of                 None   Urine Epith                Few(5-10/hpf)               Rare(0-4/hpf)  EKG  Procedure date:  06/13/2009  Findings:      Sinus tach/ poor R wave progression  Impression & Recommendations:  Problem # 1:  HYPERTENSION (ICD-401.9) ok on rx His updated medication list for this problem includes:    Doxazosin Mesylate 8 Mg Tabs (Doxazosin mesylate) .Marland Kitchen... Take one half by mouth at bedtime  Orders: EKG w/ Interpretation (93000) TLB-BMP (Basic Metabolic Panel-BMET) (29924-QASTMHD) TLB-Udip w/ Micro (81001-URINE)  Problem # 2:  HYPERLIPIDEMIA (ICD-272.4)  target LDL < 130 (pos HBP, male, former smoker) Orders: Nutrition Referral (Nutrition) TLB-Lipid Panel (80061-LIPID) TLB-TSH (Thyroid Stimulating Hormone) (84443-TSH) TLB-Hepatic/Liver Function Pnl (80076-HEPATIC)  Labs Reviewed: SGOT: 17 (05/09/2009)   SGPT: 16 (05/09/2009)   HDL:25.1 (07/02/2007)  LDL:105 (07/02/2007)  >  June 13, 2009 = 96  Chol:146 (07/02/2007)  Trig:80 (07/02/2007)    Problem # 3:  MORBID OBESITY (ICD-278.01)  Weight control is a matter of calorie balance which needs to be tilted in the pt's  favor by eating less and exercising more.  Specifically, I recommended  exercise at a level where pt  is short of breath but not out of breath 30 minutes daily.  If not losing weight on this program, I would strongly recommend pt see a nutritionist with a food diary recorded for two weeks prior to the visit.   refer to nutrition  Problem # 4:  LEFT SIDED ULCERATIVE COLITIS (ICD-556.5) per Dr Bill Fox, defer rectal also to Dr Bill Fox  Other Orders: Est. Patient 65& > (47096) TLB-CBC Platelet - w/Differential (85025-CBCD)  Patient Instructions: 1)  Return tomorrow fasting for labs 2)  Call [phone] for your results w/in next 3 days - if there's something important  I feel you need to know,  I'll be in touch with you directly.  3)  Weight control is simply a matter of calorie balance which needs to be tilted in your favor by eating less and  exercising more.  To get the most out of exercise, you need to be continuously aware that you are short of breath, but never out of breath, for 30 minutes daily. As you improve, it will actually be easier for you to do the same amount in  30 minutes so always push to the level where you are short of breath.  If this does not result in gradual weight reduction,  I recommend  a nutritionist for a food diary 4)  See Patient Care Coordinator before leaving for referral to nutrition 5)  Return to office in 3 months, sooner if needed    [employer] ECG  ID: [id number] Patient: Bill Fox, Bill Fox DOB: January 19, 1941 Age: 70 Years Old Sex: Male Race: White Height: 72 Weight: 260 Status: Unconfirmed Past Medical History:  ULCERATIVE COLITIS...............................................................................Marland KitchenPerry COLONIC POLYPS, HX OF (ICD-V12.72)     - colonoscopy 07/24/2007 HYPERLIPIDEMIA (ICD-272.4)         -  target LDL < 130 (pos HBP, male, former smoker) MORBID OBESITY (ICD-278.01)     -  ideal = 168  target 208 BENIGN POSITIONAL VERTIGO, HX OF (ICD-V12.49) HYPERTENSION (ICD-401.9)  Microsocopic Hematuria, neg w/u 1999 PULMONARY EMBOLISM 07/06/2008     - Left DVT by venous doppler 07/07/2008 (asymptomatic) > neg venous doppler 11/01/08     - nl ECHO 07/08/08 HEALTH MAINTENANCE..............................................................................Marland KitchenWert     - Pneumovax 06/20/2008     - Td 06/2002  Recorded: 06/13/2009 08:56 AM P/PR: 117 ms / 160 ms - Heart rate (maximum exercise) QRS: 93 QT/QTc/QTd: 352 ms / 427 ms / 38 ms - Heart rate (maximum exercise)  P/QRS/T axis: 65 deg / -55 deg / 69 deg - Heart rate (maximum exercise)  Heartrate: 103 bpm  Interpretation:  Sinus tach/ poor R wave progression

## 2010-06-19 NOTE — Progress Notes (Signed)
Summary: Courtesy call/reminder of labs   Phone Note Outgoing Call   Call placed by: Randye Lobo [place],  December 19, 2009 1:11 PM Call placed to: Patient Summary of Call: Called patient and left message that he missed his labs for July and will need to come in for CBC.  Asked him to call me back. Initial call taken by: Randye Lobo [place],  December 19, 2009 1:12 PM  Follow-up for Phone Call        Pt. called and stated that at his last o.v. with you, you said he doesn't need to come in to have labs until next o.v.   Looking at his last labs, it said to repeat in 4 weeks.  which one would you prefer?  Please advise.  Thanks   Randye Lobo NCMA  December 20, 2009 1:25 PM   Additional Follow-up for Phone Call Additional follow up Details #1::        it has been 8 weeks since his last CBC. He should get one now and keep his August 30 appointment Additional Follow-up by: Irene Shipper MD,  December 20, 2009 2:28 PM    Additional Follow-up for Phone Call Additional follow up Details #2::    Called patient and left message for him to come in and get labs now.  Keep appt. August 30th. Follow-up by: Randye Lobo NCMA,  December 20, 2009 2:31 PM

## 2010-06-19 NOTE — Assessment & Plan Note (Signed)
Summary: Followup ulcerative colitis    History of Present Illness Visit Type: Follow-up Visit Primary GI MD: Scarlette Shorts MD Primary Provider: [name], MD Chief Complaint: pt. does c/o loose stools but no other complaints History of Present Illness:   70 year old with hypertension, hyperlipidemia, history of pulmonary embolus, right-sided adenomatous colon polyps, and left-sided ulcerative colitis. He was last evaluated in the office October 18, 2009. He is on 6 mercaptopurine and Asacol for his colitis. At that time he was doing well except for an anal fissure was questionable draining fistula. Metronidazole, sitz baths, diltiazem, and asked them all prescribed. His rectal discomfort has completely resolved. Today he reports increased loose stool morning only. Previously with 2 bowel movements per day. Now with watery bowel movement morning and 2 additional bowel movements thereafter. No palpable pain, mucous, or bleeding. Cost usual complaint is fatigue. This is ongoing. Laboratories from last week showed a hemoglobin of 11.2, white blood cell count 6.8, and total platelets 367.Marland Kitchen He is pleased to report some weight loss since his last office visit   GI Review of Systems    Reports weight loss.   Weight loss of 20 pounds pounds over 3 months.   Denies abdominal pain, acid reflux, belching, bloating, chest pain, dysphagia with liquids, dysphagia with solids, heartburn, loss of appetite, nausea, vomiting, vomiting blood, and  weight gain.      Reports change in bowel habits and  diarrhea.     Denies anal fissure, black tarry stools, constipation, diverticulosis, fecal incontinence, heme positive stool, hemorrhoids, irritable bowel syndrome, jaundice, light color stool, liver problems, rectal bleeding, and  rectal pain.    Current Medications (verified): 1)  Mirtazapine 30 Mg  Tabs (Mirtazapine) .... One At Bedtime 2)  Doxazosin Mesylate 8 Mg  Tabs (Doxazosin Mesylate) .... Take One Half By  Mouth At Bedtime 3)  Asacol 400 Mg  Tbec (Mesalamine) .... 4 Tablets Three Times A Day 4)  Aspirin 81 Mg Tabs (Aspirin) .... One Each Am 5)  Viagra 50 Mg Tabs (Sildenafil Citrate) .... One Daily As Needed 6)  Unisom 25 Mg Tabs (Doxylamine Succinate (Sleep)) .... At Bedtime 7)  Mercaptopurine 50 Mg Tabs (Mercaptopurine) .... Take 3 Tablets By Mouth Every Morning  Allergies (verified): No Known Drug Allergies  Past History:  Past Medical History: Reviewed history from 01/16/2010 and no changes required. ULCERATIVE COLITIS...................................................................................Marland KitchenPerry + COLONIC POLYPS, HX OF (ICD-V12.72)     - colonoscopy 07/24/2007     - colonoscopy 06/08/09:  mucosal changes consistent with moderately severe     left-sided ulcerative colitis involving the rectum to distal  transverse colon confluent HYPERLIPIDEMIA (ICD-272.4)         -  target LDL < 130 (pos HBP, male, former smoker) MORBID OBESITY (ICD-278.01)     -  ideal = 168  target 208     -  Did not keep nutrition appt  07/04/09 BENIGN POSITIONAL VERTIGO, HX OF (ICD-V12.49) HYPERTENSION (ICD-401.9)  Microsocopic Hematuria, neg w/u 1999, resent to Urology August 03, 2009 >>> negative PULMONARY EMBOLISM 07/06/2008     - Left DVT by venous doppler 07/07/2008 (asymptomatic) > neg venous doppler 11/01/08     - nl ECHO 07/08/08  HEALTH MAINTENANCE..............................................................................Marland KitchenWert     - Pneumovax 06/20/2008     - Td 06/2002     - CPX June 13, 2009   coronary calcifications ? significance 08/2009  Past Surgical History: Reviewed history from 06/01/2008 and no changes required. Broken nose  Family History: Reviewed  history from 06/23/2009 and no changes required. DM in mother, 2 paternal Uncles inflammatory bowel dz, or premature heart dz Cancer: Father, unknown origion-? stomach Family History of Lung Cancer: 43 Brother No FH of Colon  Cancer:  Social History: Reviewed history from 08/01/2009 and no changes required. Quit smoking 1995 x42yr, 1ppd Former MDatabase administratorAlcohol Use - yes- occ beer Illicit Drug Use - no Patient does not get regular exercise.  work fulltime- tArmed forces operational officer Review of Systems       The patient complains of fatigue.  The patient denies allergy/sinus, anemia, anxiety-new, arthritis/joint pain, back pain, blood in urine, breast changes/lumps, change in vision, confusion, cough, coughing up blood, depression-new, fainting, fever, headaches-new, hearing problems, heart murmur, heart rhythm changes, itching, muscle pains/cramps, night sweats, nosebleeds, shortness of breath, skin rash, sleeping problems, sore throat, swelling of feet/legs, swollen lymph glands, thirst - excessive, urination - excessive, urination changes/pain, urine leakage, vision changes, voice change, menstrual pain, pregnancy symptoms, thirst - excessive , and urination - excessive .    Vital Signs:  Patient profile:   70year old male Height:      72 inches Weight:      255.6 pounds BMI:     34.79 Pulse rate:   96 / minute Pulse rhythm:   regular BP sitting:   128 / 74  (left arm)  Vitals Entered By: BRandye LoboNCMA (January 31, 2010 8:40 AM)  Physical Exam  General:  Well developed, well nourished, no acute distress. Head:  Normocephalic and atraumatic. Eyes:  PERRLA, no icterus. Mouth:  No deformity or lesions,  Lungs:  Clear throughout to auscultation. Heart:  Regular rate and rhythm; no murmurs, rubs,  or bruits. Abdomen:  Soft, obese,nontender and nondistended. No masses, hepatosplenomegaly or hernias noted. Normal bowel sounds. Pulses:  Normal pulses noted. Extremities:  no edema Neurologic:  alert and oriented Skin:  no rash or jaundice Psych:  Alert and cooperative. Normal mood and affect.   Impression & Recommendations:  Problem # 1:  LEFT SIDED ULCERATIVE COLITIS  (ICD-556.5) seems to be doing well. Bowel frequency slightly more increased in normal.  Plan: #1. Continue 6 mercaptopurine #2. Continue Asacol #3. CBC to 2 months #4. Routine office follow up in 3 months. Contact the office in the interim for questions or problems  Problem # 2:  ANAL FISSURE (ICD-565.0) Assessment: Improved improved after medical therapy  Problem # 3:  ANEMIA, IRON DEFICIENCY, MICROCYTIC (ICD-280.8) mild anemia with complaints of fatigue.  Plan: #1 iron sulfate 325 mg daily prescribed  Problem # 4:  FATIQUE AND MALAISE (ICD-780.79) see above  Problem # 5:  COLONIC POLYPS, HX OF (ICD-V12.72) due for followup January 2013  Patient Instructions: 1)  Iron Sulfate 325 1 by mouth once daily #30 x 11 RFs. 2)  Please schedule a follow-up appointment in 3 months. 3)  Copy sent to : MChristinia Gully MD 4)  The medication list was reviewed and reconciled.  All changed / newly prescribed medications were explained.  A complete medication list was provided to the patient / caregiver. Prescriptions: FERROUS SULFATE 325 (65 FE) MG TABS (FERROUS SULFATE) 1 by mouth once daily  #30 x 11   Entered by:   BRandye LoboNCMA   Authorized by:   JIrene ShipperMD   Signed by:   BRandye LoboNCMA on 01/31/2010   Method used:   Electronically to        [facility]  [address]       [place], [place]  [address]       Ph: [phone]       Fax: [phone]   RxID:   [id number]

## 2010-06-21 NOTE — Medication Information (Signed)
Summary: Refill autho for Mercaptopurine/[facility]  Refill autho for Mercaptopurine/[facility]   Imported By: Phillis Knack 05/09/2010 10:42:05  _____________________________________________________________________  External Attachment:    Type:   Image     Comment:   External Document

## 2010-06-21 NOTE — Assessment & Plan Note (Signed)
Summary: Followup-left-sided UC/fissure    History of Present Illness Visit Type: Follow-up Visit Primary GI MD: Scarlette Shorts MD Primary Provider: [name], MD Chief Complaint: Burning during and after bowel movements History of Present Illness:   70 year old white male with hypertension, hyperlipidemia, history of pulmonary embolus, right-sided adenomatous colon polyps, and left-sided ulcerative colitis. He was last evaluated in the office January 31, 2010. At that time his bowels are slightly more frequent than normal. His anal fissure improved. Followup CBCs have been stable. He continues on 6 mercaptopurine and Asacol. He presents today for routine followup. In terms of his colitis, he is doing well. He describes his bowel habits as normal without bleeding. . No abdominal pain. Still with some intermittent rectal burning, approximately 2 days per month. He feels that diltiazem cream has helped his fissure, but needs a refill.   GI Review of Systems      Denies abdominal pain, acid reflux, belching, bloating, chest pain, dysphagia with liquids, dysphagia with solids, heartburn, loss of appetite, nausea, vomiting, vomiting blood, weight loss, and  weight gain.      Reports rectal pain.     Denies anal fissure, black tarry stools, change in bowel habit, constipation, diarrhea, diverticulosis, fecal incontinence, heme positive stool, hemorrhoids, irritable bowel syndrome, jaundice, light color stool, liver problems, and  rectal bleeding.    Current Medications (verified): 1)  Mirtazapine 30 Mg  Tabs (Mirtazapine) .... One At Bedtime 2)  Doxazosin Mesylate 8 Mg  Tabs (Doxazosin Mesylate) .... Take One Half By Mouth At Bedtime 3)  Asacol 400 Mg  Tbec (Mesalamine) .... 4 Tablets Three Times A Day 4)  Aspirin 81 Mg Tabs (Aspirin) .... One Each Am 5)  Viagra 50 Mg Tabs (Sildenafil Citrate) .... One Daily As Needed 6)  Mercaptopurine 50 Mg Tabs (Mercaptopurine) .... Take 3 Tablets By Mouth  Every Morning  Allergies (verified): No Known Drug Allergies  Past History:  Past Medical History: Reviewed history from 01/16/2010 and no changes required. ULCERATIVE COLITIS...................................................................................Marland KitchenPerry + COLONIC POLYPS, HX OF (ICD-V12.72)     - colonoscopy 07/24/2007     - colonoscopy 06/08/09:  mucosal changes consistent with moderately severe     left-sided ulcerative colitis involving the rectum to distal  transverse colon confluent HYPERLIPIDEMIA (ICD-272.4)         -  target LDL < 130 (pos HBP, male, former smoker) MORBID OBESITY (ICD-278.01)     -  ideal = 168  target 208     -  Did not keep nutrition appt  07/04/09 BENIGN POSITIONAL VERTIGO, HX OF (ICD-V12.49) HYPERTENSION (ICD-401.9)  Microsocopic Hematuria, neg w/u 1999, resent to Urology August 03, 2009 >>> negative PULMONARY EMBOLISM 07/06/2008     - Left DVT by venous doppler 07/07/2008 (asymptomatic) > neg venous doppler 11/01/08     - nl ECHO 07/08/08  HEALTH MAINTENANCE..............................................................................Marland KitchenWert     - Pneumovax 06/20/2008     - Td 06/2002     - CPX June 13, 2009   coronary calcifications ? significance 08/2009  Past Surgical History: Reviewed history from 06/01/2008 and no changes required. Broken nose  Family History: Reviewed history from 06/23/2009 and no changes required. DM in mother, 2 paternal Uncles inflammatory bowel dz, or premature heart dz Cancer: Father, unknown origion-? stomach Family History of Lung Cancer: 93 Brother No FH of Colon Cancer:  Social History: Reviewed history from 08/01/2009 and no changes required. Quit smoking 1995 x25yr, 1ppd Former MDatabase administratorAlcohol Use - yes-  occ beer Illicit Drug Use - no Patient does not get regular exercise.  work fulltime- Armed forces operational officer  Review of Systems  The patient denies allergy/sinus, anemia, anxiety-new,  arthritis/joint pain, back pain, blood in urine, breast changes/lumps, change in vision, confusion, cough, coughing up blood, depression-new, fainting, fatigue, fever, headaches-new, hearing problems, heart murmur, heart rhythm changes, itching, menstrual pain, muscle pains/cramps, night sweats, nosebleeds, pregnancy symptoms, shortness of breath, skin rash, sleeping problems, sore throat, swelling of feet/legs, swollen lymph glands, thirst - excessive , urination - excessive , urination changes/pain, urine leakage, vision changes, and voice change.    Vital Signs:  Patient profile:   70 year old male Height:      72 inches Weight:      255.50 pounds BMI:     34.78 Pulse rate:   92 / minute Pulse rhythm:   regular BP sitting:   124 / 72  (left arm) Cuff size:   large  Vitals Entered By: June McMurray CMA Deborra Medina) (April 30, 2010 8:25 AM)  Physical Exam  General:  Well developed, well nourished, no acute distress. Head:  Normocephalic and atraumatic. Eyes:  PERRLA, no icterus. Mouth:  No deformity or lesions. Lungs:  Clear throughout to auscultation. Heart:  Regular rate and rhythm; no murmurs, rubs,  or bruits. Abdomen:  Soft, obese,nontender and nondistended. No masses, hepatosplenomegaly or hernias noted. Normal bowel sounds. Rectal:  small posterior fissure. Otherwise normal Pulses:  Normal pulses noted. Extremities:  no edema Neurologic:  alert and oriented Skin:  no jaundice Psych:  Alert and cooperative. Normal mood and affect.   Impression & Recommendations:  Problem # 1:  ANAL FISSURE (ICD-565.0) intermittent burning due to fissure.  Plan #1. Refill diltiazem cream 2% #2. sitz baths  Problem # 2:  LEFT SIDED ULCERATIVE COLITIS (ICD-556.5) currently asymptomatic on Asacol and6-mercaptopurine.   Plan: #1. Continue Asacol and 6-mercaptopurine at current dosages #2. Continue CBC Q2 months #3. Routine office followup in 6 months  Problem # 3:  COLONIC POLYPS, HX  OF (ICD-V12.72) . History of adenoma. Due for followup January 2013  Problem # 4:  ANEMIA, IRON DEFICIENCY, MICROCYTIC (ICD-280.8) history of iron deficiency anemia. Resolved.  Patient Instructions: 1)  Refill Diltiazem cream 2% to apply to rectum as directed. 2)  Please schedule a follow-up appointment in 6 months. 3)  You will repeat your Labs in February 2012. 4)  Copy sent to : [name], MD 5)  The medication list was reviewed and reconciled.  All changed / newly prescribed medications were explained.  A complete medication list was provided to the patient / caregiver. Prescriptions: DILTIAZEM CREAM 2% apply as directed to rectum  #1 x 1   Entered by:   Randye Lobo NCMA   Authorized by:   Irene Shipper MD   Signed by:   Randye Lobo NCMA on 04/30/2010   Method used:   Telephoned to ...       [facility] (retail)       [address] [place], [place]  [id number]       Ph: [phone]       Fax: [phone]   RxID:   [id number]

## 2010-06-21 NOTE — Assessment & Plan Note (Signed)
Summary: Primay svc/ f/u ov   Primary Provider/Referring Provider:  [name], MD  CC:  Fatigue and HTN- no problems..  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   Admit 2/17 > 24/2010 admit with PE ok for coumadin by GI Henrene Pastor), presented with sob/ syncope   July 20, 2008 first post hosp fu for PE/ Left DVT ov no cp or sob, no rectal bleeding despite coumadin.  June 13, 2009 cpx,  no change in rx  Sep 26, 2009 cc coronary calcifications on ct of kidneys and rectal pain with bm's but no bleeding.    December 25, 2009--Presents for an acute office visit. Complains of burning in right thigh from knee to hip x24month progressively getting worse.  ? lat fem cut nerve > resolved  January 16, 2010 3 month followup.  Pt states pain in rt leg no better but no worse.  He states pain usually only bothers him in the am.  He c/o boil on his back x 2 days- not painful.  no fever. found imbedded tick  rx doxy and resolved   May 01, 2010 ov cc wt gain, no more skin problems. Pt denies any significant sore throat, dysphagia, itching, sneezing,  nasal congestion or excess secretions,  fever, chills, sweats, unintended wt loss, pleuritic or exertional cp, hempoptysis, change in activity tolerance  orthopnea pnd or leg swelling   Current Medications (verified): 1)  Mirtazapine 30 Mg  Tabs (Mirtazapine) .... One At Bedtime 2)  Doxazosin Mesylate 8 Mg  Tabs (Doxazosin Mesylate) .... Take One Half By Mouth At Bedtime 3)  Asacol 400 Mg  Tbec (Mesalamine) .... 4 Tablets Three Times A Day 4)  Aspirin 81 Mg Tabs (Aspirin) .... One Each Am 5)  Viagra 50 Mg Tabs (Sildenafil Citrate) .... One Daily As Needed 6)  Mercaptopurine 50 Mg Tabs (Mercaptopurine) .... Take 3 Tablets By Mouth Every Morning 7)  Diltiazem Cream 2% .... Apply As Directed To Rectum  Allergies (verified): No Known Drug  Allergies  Past History:  Past Medical History: ULCERATIVE COLITIS....................................................................................Marland Kitchenerry + COLONIC POLYPS, HX OF (ICD-V12.72)     - colonoscopy 07/24/2007     - colonoscopy 06/08/09:  mucosal changes consistent with moderately severe     left-sided ulcerative colitis involving the rectum to distal  transverse colon confluent HYPERLIPIDEMIA (ICD-272.4)         -  target LDL < 130 (pos HBP, male, former smoker) MORBID OBESITY (ICD-278.01)     -  ideal = 168  target 208     -  Did not keep nutrition appt  07/04/09 BENIGN POSITIONAL VERTIGO, HX OF (ICD-V12.49) HYPERTENSION (ICD-401.9)  Microsocopic Hematuria, neg w/u 1999, resent to Urology August 03, 2009 >>> negative PULMONARY EMBOLISM 07/06/2008     - Left DVT by venous doppler 07/07/2008 (asymptomatic) > neg venous doppler 11/01/08     - nl ECHO 07/08/08  HEALTH MAINTENANCE...............................................................................Marland Kitchenert     - Pneumovax 06/20/2008 (age 70 .     - Td 06/2002     - CPX June 13, 2009   coronary calcifications ? significance 08/2009  Vital Signs:  Patient profile:   70year old male Weight:      260 pounds O2 Sat:      94 % on Room air Temp:     97.8 degrees F oral Pulse rate:   78 / minute  BP sitting:   118 / 70  (left arm) Cuff size:   large  Vitals Entered By: Tilden Dome (May 01, 2010 9:19 AM)  O2 Flow:  Room air  Physical Exam  Additional Exam:  wt   265 June 28, 2008  > 260 06/13/09 > 278 Sep 26, 2009 >>269 December 25, 2009> wt  264 January 16, 2010 > 260 May 01, 2010  Ambulatory  somber wm appearing in no acute distress. HEENT:  bottom teeth gone, upper partials in place, and orophanx clear Neck without JVD/Nodes/TM.  Lungs clear to A and P bilaterally without cough on insp or exp maneuvers RRR no s3 or murmur or increase in P2 No edema Abd soft obese no tenderness.-large panniculus.  Ext warm  without calf tenderness, cyanosis clubbing or edema Neuro mood/affect flat, somber nl sensorium, nl gait, no focal sensory, motor, cerebellar deficits noted, SLR neg. equal strength,        Impression & Recommendations:  Problem # 1:  HYPERTENSION (ICD-401.9)  His updated medication list for this problem includes:    Doxazosin Mesylate 8 Mg Tabs (Doxazosin mesylate) .Marland Kitchen... Take one half by mouth at bedtime   ok on rx  Orders: Est. Patient Level III (58850)  Problem # 2:  HYPERLIPIDEMIA (ICD-272.4)  Labs Reviewed: SGOT: 18 (06/13/2009)   SGPT: 17 (06/13/2009)   HDL:34.10 (06/13/2009), 25.1 (07/02/2007)  LDL:96 (06/13/2009), 105 (07/02/2007)  Chol:149 (06/13/2009), 146 (07/02/2007)  Trig:93.0 (06/13/2009), 80 (07/02/2007)   needs recheck at cpx   Orders: Est. Patient Level III (27741)  Problem # 3:  MORBID OBESITY (ICD-278.01)  Weight control is a matter of calorie balance which needs to be tilted in the pt's favor by eating less and exercising more.  Specifically, I recommended  exercise at a level where pt  is short of breath but not out of breath 30 minutes daily.  If not losing weight on this program, I would strongly recommend pt see a nutritionist with a food diary recorded for two weeks prior to the visit.      Each maintenance medication was reviewed in detail including most importantly the difference between maintenance and as needed and under what circumstances the prns are to be used.   Orders: Est. Patient Level III (28786)  Patient Instructions: 1)  Please schedule a follow-up appointment in 6 weeks, sooner if needed with CPX 2)  Weight control is simply a matter of calorie balance which needs to be tilted in your favor by eating less and exercising more.  To get the most out of exercise, you need to be continuously aware that you are short of breath, but never out of breath, for 30 minutes daily. As you improve, it will actually be easier for you to do the same  amount in  30 minutes so always push to the level where you are short of breath.  If this does not result in gradual weight reduction,  I recommend  a nutritionist for a food diary

## 2010-06-27 ENCOUNTER — Telehealth: Payer: Self-pay | Admitting: Internal Medicine

## 2010-06-28 ENCOUNTER — Encounter (INDEPENDENT_AMBULATORY_CARE_PROVIDER_SITE_OTHER): Payer: Self-pay | Admitting: *Deleted

## 2010-06-28 ENCOUNTER — Other Ambulatory Visit: Payer: Self-pay | Admitting: Internal Medicine

## 2010-06-28 ENCOUNTER — Other Ambulatory Visit: Payer: Medicare Other

## 2010-06-28 DIAGNOSIS — Z0389 Encounter for observation for other suspected diseases and conditions ruled out: Secondary | ICD-10-CM

## 2010-06-28 DIAGNOSIS — Z0000 Encounter for general adult medical examination without abnormal findings: Secondary | ICD-10-CM

## 2010-06-28 LAB — CBC WITH DIFFERENTIAL/PLATELET
Basophils Relative: 0.6 % (ref 0.0–3.0)
Eosinophils Relative: 2.2 % (ref 0.0–5.0)
MCV: 93.5 fl (ref 78.0–100.0)
Monocytes Absolute: 0.7 10*3/uL (ref 0.1–1.0)
Neutrophils Relative %: 60 % (ref 43.0–77.0)
RBC: 4.23 Mil/uL (ref 4.22–5.81)
WBC: 6.2 10*3/uL (ref 4.5–10.5)

## 2010-06-28 LAB — URINALYSIS, ROUTINE W REFLEX MICROSCOPIC
Bilirubin Urine: NEGATIVE
Ketones, ur: NEGATIVE
Total Protein, Urine: NEGATIVE
Urine Glucose: NEGATIVE
pH: 5.5 (ref 5.0–8.0)

## 2010-06-28 LAB — PSA: PSA: 0.78 ng/mL (ref 0.10–4.00)

## 2010-06-28 LAB — HEPATIC FUNCTION PANEL
ALT: 33 U/L (ref 0–53)
Alkaline Phosphatase: 62 U/L (ref 39–117)
Bilirubin, Direct: 0.1 mg/dL (ref 0.0–0.3)
Total Protein: 7.3 g/dL (ref 6.0–8.3)

## 2010-06-28 LAB — LIPID PANEL
LDL Cholesterol: 134 mg/dL — ABNORMAL HIGH (ref 0–99)
Total CHOL/HDL Ratio: 7

## 2010-06-28 LAB — BASIC METABOLIC PANEL
Chloride: 104 mEq/L (ref 96–112)
Creatinine, Ser: 0.9 mg/dL (ref 0.4–1.5)
Potassium: 4.6 mEq/L (ref 3.5–5.1)
Sodium: 136 mEq/L (ref 135–145)

## 2010-07-02 ENCOUNTER — Encounter: Payer: Self-pay | Admitting: Internal Medicine

## 2010-07-05 NOTE — Progress Notes (Signed)
Summary: reminder labs/needs f/u appt. in June   Phone Note Outgoing Call   Call placed by: Randye Lobo NCMA,  June 27, 2010 3:59 PM Call placed to: Patient Summary of Call: Called patient to remind him it is time for labs.  He will come this week.  I made a reminder for [place] to make an appt. for him to follow-up with Dr. Henrene Pastor in June.  Pt. prefers early morning.   Initial call taken by: Randye Lobo NCMA,  June 27, 2010 4:00 PM

## 2010-07-10 ENCOUNTER — Encounter: Payer: Self-pay | Admitting: Internal Medicine

## 2010-07-11 NOTE — Medication Information (Signed)
Summary: Asacol / [facility]  Asacol / [facility]   Imported By: Rise Patience 07/05/2010 09:32:29  _____________________________________________________________________  External Attachment:    Type:   Image     Comment:   External Document

## 2010-07-16 ENCOUNTER — Telehealth (INDEPENDENT_AMBULATORY_CARE_PROVIDER_SITE_OTHER): Payer: Self-pay | Admitting: *Deleted

## 2010-07-16 ENCOUNTER — Telehealth: Payer: Self-pay | Admitting: Internal Medicine

## 2010-07-17 NOTE — Letter (Signed)
Summary: [facility]  [address], [place] [address]   Phone: [phone]  Fax: [phone]    07/10/2010  [address] [place], [place]  [address]  [place]  Dear [name],   Please call our office at [phone] to obtain recent lab results. Thank You.        Sincerely,   [facility]

## 2010-07-23 ENCOUNTER — Telehealth: Payer: Self-pay | Admitting: Internal Medicine

## 2010-07-26 NOTE — Progress Notes (Signed)
Summary: results  Phone Note Call from Patient   Caller: Patient Call For: wert Summary of Call: call pt at [phone] (cell) AFTER 10 am re: results/labs. ask for "lucky".  Initial call taken by: Cooper Render, CNA,  July 16, 2010 8:45 AM  Follow-up for Phone Call        Called, spoke with pt.  He was informed of lab results and recs per appned from 06/28/10 by Dr. Melvyn Novas.  He verbalized understanding.  Reminded of cpx on 08/15/10 with MW.   Follow-up by: Raymondo Band RN,  July 16, 2010 11:19 AM

## 2010-07-26 NOTE — Progress Notes (Signed)
Summary: labs   Phone Note Call from Patient Call back at Work Phone [phone]   Caller: Patient Call For: Dr Henrene Pastor Reason for Call: Talk to Nurse Summary of Call: Patient wants to know if Dr Henrene Pastor reviewed his labs. (Please call him after 10am) Initial call taken by: Ronalee Red,  July 16, 2010 9:21 AM  Follow-up for Phone Call         please let the patient know that I have reviewed his labs this morning. They looked fine. He needs a repeat CBC in 2 months Follow-up by: Irene Shipper MD,  July 16, 2010 10:52 AM  Additional Follow-up for Phone Call Additional follow up Details #1::        Patient aware of Dr. Blanch Media recommendations. Additional Follow-up by: Rosanne Sack RN,  July 16, 2010 11:05 AM

## 2010-07-31 NOTE — Progress Notes (Signed)
Summary: traige - rectal pain  Medications Added LMX 5 5 % CREA (LIDOCAINE (ANORECTAL)) Apply to rectum as needed for pain       Phone Note Call from Patient Call back at Home Phone [phone]   Caller: Patient Call For: Dr. Henrene Pastor Reason for Call: Talk to Nurse Summary of Call: Patient has ulcerative colitis and he is having trouble going to the bathroom, it is buring, wants to know if we can call in pain meds for him Initial call taken by: Martinique Johnson,  July 23, 2010 9:10 AM  Follow-up for Phone Call        Spoke with patient and he states that he is having a lot of burning and discomfort due to his fissure. States that is feels like he is "on fire down there." Patient states that he is using his diltiazem cream, suppositories, and sitz bathes but they are not helping. Patient is requesting some pain medicine. Dr. Henrene Pastor please advise. Follow-up by: Rosanne Sack RN,  July 23, 2010 9:25 AM  Additional Follow-up for Phone Call Additional follow up Details #1::        topical lidoocaine cream or ointment (5% if they have it, otherwise, next highest dose). Also, get him a general surgical appointment for evaluation "non-healing fissure" Additional Follow-up by: Irene Shipper MD,  July 23, 2010 9:35 AM    New/Updated Medications: LMX 5 5 % CREA (LIDOCAINE (ANORECTAL)) Apply to rectum as needed for pain Prescriptions: LMX 5 5 % CREA (LIDOCAINE (ANORECTAL)) Apply to rectum as needed for pain  #1 tube x 1   Entered by:   Rosanne Sack RN   Authorized by:   Irene Shipper MD   Signed by:   Rosanne Sack RN on 07/23/2010   Method used:   Electronically to        [facility] (retail)       [address]       [place], [place]  [address]       Ph: [phone]       Fax: [phone]   RxID:   [id number]   Appended Document: traige - rectal pain Surgical referral with Dr. Rolm Bookbinder for 07/31/10 @9 :15am for a 9:45am appointment. Patient aware of  appointment date and time.   Clinical Lists Changes  Orders: Added new Test order of [facility] ([facility]) - Signed

## 2010-08-13 ENCOUNTER — Encounter: Payer: Self-pay | Admitting: Internal Medicine

## 2010-08-15 ENCOUNTER — Encounter: Payer: Self-pay | Admitting: Internal Medicine

## 2010-08-15 ENCOUNTER — Other Ambulatory Visit: Payer: Self-pay | Admitting: *Deleted

## 2010-08-15 ENCOUNTER — Other Ambulatory Visit (INDEPENDENT_AMBULATORY_CARE_PROVIDER_SITE_OTHER): Payer: Medicare Other | Admitting: Internal Medicine

## 2010-08-15 ENCOUNTER — Ambulatory Visit (INDEPENDENT_AMBULATORY_CARE_PROVIDER_SITE_OTHER): Payer: Medicare Other | Admitting: Internal Medicine

## 2010-08-15 ENCOUNTER — Other Ambulatory Visit (INDEPENDENT_AMBULATORY_CARE_PROVIDER_SITE_OTHER): Payer: Medicare Other

## 2010-08-15 ENCOUNTER — Ambulatory Visit (INDEPENDENT_AMBULATORY_CARE_PROVIDER_SITE_OTHER)
Admission: RE | Admit: 2010-08-15 | Discharge: 2010-08-15 | Disposition: A | Discharge status: Home / Self Care | Payer: Medicare Other | Source: Ambulatory Visit | Attending: Internal Medicine | Admitting: Internal Medicine

## 2010-08-15 DIAGNOSIS — E785 Hyperlipidemia, unspecified: Secondary | ICD-10-CM

## 2010-08-15 DIAGNOSIS — Z0000 Encounter for general adult medical examination without abnormal findings: Secondary | ICD-10-CM

## 2010-08-15 DIAGNOSIS — I10 Essential (primary) hypertension: Secondary | ICD-10-CM

## 2010-08-15 DIAGNOSIS — R3129 Other microscopic hematuria: Secondary | ICD-10-CM

## 2010-08-15 LAB — BASIC METABOLIC PANEL
CO2: 27 mEq/L (ref 19–32)
Calcium: 9.2 mg/dL (ref 8.4–10.5)
Chloride: 108 mEq/L (ref 96–112)
Glucose, Bld: 93 mg/dL (ref 70–99)
Sodium: 141 mEq/L (ref 135–145)

## 2010-08-15 LAB — CBC WITH DIFFERENTIAL/PLATELET
Basophils Absolute: 0 10*3/uL (ref 0.0–0.1)
Basophils Relative: 0.6 % (ref 0.0–3.0)
Eosinophils Absolute: 0.1 10*3/uL (ref 0.0–0.7)
HCT: 39.7 % (ref 39.0–52.0)
Hemoglobin: 13.6 g/dL (ref 13.0–17.0)
Lymphocytes Relative: 24.1 % (ref 12.0–46.0)
Lymphs Abs: 1.3 10*3/uL (ref 0.7–4.0)
MCHC: 34.3 g/dL (ref 30.0–36.0)
MCV: 94.6 fl (ref 78.0–100.0)
Monocytes Absolute: 0.5 10*3/uL (ref 0.1–1.0)
Neutro Abs: 3.5 10*3/uL (ref 1.4–7.7)
RBC: 4.2 Mil/uL — ABNORMAL LOW (ref 4.22–5.81)
RDW: 16.3 % — ABNORMAL HIGH (ref 11.5–14.6)

## 2010-08-15 LAB — URINALYSIS, ROUTINE W REFLEX MICROSCOPIC
Bilirubin Urine: NEGATIVE
Ketones, ur: NEGATIVE
Leukocytes, UA: NEGATIVE
Specific Gravity, Urine: 1.025 (ref 1.000–1.030)
Urine Glucose: NEGATIVE
Urobilinogen, UA: 0.2 (ref 0.0–1.0)

## 2010-08-15 LAB — LIPID PANEL
HDL: 30.1 mg/dL — ABNORMAL LOW (ref >39.00)
Total CHOL/HDL Ratio: 6

## 2010-08-15 NOTE — Progress Notes (Signed)
Subjective:    Patient ID: Bill Fox, male    DOB: [birth year], 70 y.o.   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   Admit 2/17 > 24/2010 admit with PE ok for coumadin by GI Henrene Pastor), presented with sob/ syncope   July 20, 2008 first post hosp fu for PE/ Left DVT ov no cp or sob, no rectal bleeding despite coumadin.   June 13, 2009 cpx, no change in rx   Sep 26, 2009 cc coronary calcifications on ct of kidneys and rectal pain with bm's but no bleeding.   December 25, 2009--Presents for an acute office visit. Complains of burning in right thigh from knee to hip x64month progressively getting worse. Describes a burning sensation along outer/mid thigh w/ prickling sensation that comes and goes.  Dx LFC syndrome  \08/15/2010  cpx no nex c/os x cataracts worse and need to be removed per ophtamologist.  Pt denies any significant sore throat, dysphagia, itching, sneezing,  nasal congestion or excess/ purulent secretions,  fever, chills, sweats, unintended wt loss, pleuritic or exertional cp, hempoptysis, orthopnea pnd or leg swelling.    Also denies any obvious fluctuation of symptoms with weather or environmental changes or other aggravating or alleviating factors.     Past Medical History:  ULCERATIVE COLITIS....................................................................................Marland KitchenHenrene Pastor/ WDonne Hazel+ COLONIC POLYPS, HX OF (ICD-V12.72)  - colonoscopy 07/24/2007  - colonoscopy 06/08/09: mucosal changes consistent with moderately severe  left-sided ulcerative colitis involving the rectum to distal transverse colon confluent  HYPERLIPIDEMIA (ICD-272.4)  - target LDL < 130 (pos HBP, male, former smoker)  MORBID OBESITY (ICD-278.01)  - ideal = 168 target 208  - Did not keep nutrition appt 07/04/09  BENIGN POSITIONAL VERTIGO, HX OF (ICD-V12.49)  HYPERTENSION (ICD-401.9)    Microsocopic Hematuria, neg w/u 1999, resent to Urology August 03, 2009 >>> negative  PULMONARY EMBOLISM 07/06/2008  - Left DVT by venous doppler 07/07/2008 (asymptomatic) > neg venous doppler 11/01/08  - nl ECHO 07/08/08  HEALTH MAINTENANCE...............................................................................Marland Kitchenert  - Pneumovax 06/20/2008  - Td 06/2002  - CPX  08/15/2010  coronary calcifications ? significance 08/2009         Review of Systems  Constitutional: Negative for fever, chills, activity change, appetite change and unexpected weight change.  HENT: Negative for congestion, sore throat, rhinorrhea, sneezing, trouble swallowing, dental problem, voice change and postnasal drip.   Eyes: Positive for visual disturbance.  Respiratory: Negative for cough, choking and shortness of breath.   Cardiovascular: Negative for chest pain and leg swelling.  Gastrointestinal: Negative for nausea, vomiting and abdominal pain.  Genitourinary: Negative for difficulty urinating.  Musculoskeletal: Negative for arthralgias.  Skin: Negative for rash.  Psychiatric/Behavioral: Negative for behavioral problems and confusion.       Objective:   Physical Exam   wt 265 June 28, 2008 > 260 06/13/09 > 278 Sep 26, 2009 >>269 December 25, 2009> wt 264 January 16, 2010  > 260 08/15/2010  Ambulatory somber wm appearing in no acute distress.  HEENT: bottom teeth gone, upper partials in place, and orophanx clear  Neck without JVD/Nodes/TM.  Lungs clear to A and P bilaterally without cough on insp or exp maneuvers  RRR no s3 or murmur or increase in P2 No edema  Abd soft obese no tenderness.-large panniculus.  Ext warm without calf tenderness, cyanosis clubbing or edema  Neuro mood/affect flat, somber nl sensorium, nl gait,  no focal sensory, motor, cerebellar deficits noted, SLR neg. equal strength    Assessment & Plan:

## 2010-08-15 NOTE — Assessment & Plan Note (Signed)
Refer to nutrition See instructions

## 2010-08-15 NOTE — Patient Instructions (Signed)
Weight control is simply a matter of calorie balance which needs to be tilted in your favor by eating less and exercising more.  To get the most out of exercise, you need to be continuously aware that you are short of breath, but never out of breath, for 30 minutes daily. As you improve, it will actually be easier for you to do the same amount of exercise  in  30 minutes so always push to the level where you are short of breath.  If this does not result in gradual weight reduction then I strongly recommend you see a nutritionist with a food diary x 2 weeks so that we can work out a negative calorie balance which is universally effective in steady weight loss programs.  Think of your calorie balance like you do your bank account where in this case you want the balance to go down so you must take in less calories than you burn up.  It's just that simple:  Hard to do, but easy to understand.  Good luck!

## 2010-08-15 NOTE — Assessment & Plan Note (Signed)
Key is diet and ex, not adding more meds

## 2010-08-15 NOTE — Assessment & Plan Note (Signed)
Ok on rx 

## 2010-08-17 ENCOUNTER — Telehealth: Payer: Self-pay | Admitting: Internal Medicine

## 2010-08-17 NOTE — Telephone Encounter (Signed)
ATCx2 and no answer and unable to leave message. WCBx1

## 2010-08-17 NOTE — Progress Notes (Signed)
Quick Note:  Spoke with pt and notified of results/recs per MW. Pt verbalized understanding. ______

## 2010-08-17 NOTE — Telephone Encounter (Signed)
ATCx1 and was placed on hold for 3 minutes. WCB. Brandywine Bing, CMA

## 2010-08-17 NOTE — Telephone Encounter (Signed)
Spoke with pt and notified of lab, xray and EKG results per MW.  Pt verbalized understanding.

## 2010-08-21 ENCOUNTER — Encounter: Payer: Self-pay | Admitting: Internal Medicine

## 2010-08-21 ENCOUNTER — Other Ambulatory Visit: Payer: Self-pay | Admitting: Internal Medicine

## 2010-08-21 NOTE — Telephone Encounter (Signed)
Error

## 2010-08-22 ENCOUNTER — Other Ambulatory Visit: Payer: Medicare Other

## 2010-08-22 ENCOUNTER — Other Ambulatory Visit (INDEPENDENT_AMBULATORY_CARE_PROVIDER_SITE_OTHER): Payer: Medicare Other

## 2010-08-22 DIAGNOSIS — D508 Other iron deficiency anemias: Secondary | ICD-10-CM

## 2010-08-22 LAB — CBC WITH DIFFERENTIAL/PLATELET
Basophils Absolute: 0 10*3/uL (ref 0.0–0.1)
Eosinophils Absolute: 0.1 10*3/uL (ref 0.0–0.7)
Hemoglobin: 13.8 g/dL (ref 13.0–17.0)
Lymphocytes Relative: 26.6 % (ref 12.0–46.0)
MCHC: 33.8 g/dL (ref 30.0–36.0)
Monocytes Relative: 9.8 % (ref 3.0–12.0)
Neutro Abs: 3.1 10*3/uL (ref 1.4–7.7)
Neutrophils Relative %: 60.8 % (ref 43.0–77.0)
Platelets: 220 10*3/uL (ref 150.0–400.0)
RDW: 16.2 % — ABNORMAL HIGH (ref 11.5–14.6)

## 2010-08-30 LAB — GLUCOSE, CAPILLARY: Glucose-Capillary: 134 mg/dL — ABNORMAL HIGH (ref 70–99)

## 2010-08-30 LAB — POCT CARDIAC MARKERS
CKMB, poc: 1.2 ng/mL (ref 1.0–8.0)
Myoglobin, poc: 72.3 ng/mL (ref 12–200)
Troponin i, poc: <0.05 ng/mL (ref 0.00–0.09)

## 2010-08-30 LAB — CBC
Hemoglobin: 13.3 g/dL (ref 13.0–17.0)
Platelets: 245 10*3/uL (ref 150–400)
RBC: 4.68 MIL/uL (ref 4.22–5.81)
RDW: 16.7 % — ABNORMAL HIGH (ref 11.5–15.5)
WBC: 10.1 10*3/uL (ref 4.0–10.5)
WBC: 8.4 10*3/uL (ref 4.0–10.5)

## 2010-08-30 LAB — POCT I-STAT, CHEM 8
Calcium, Ion: 1.14 mmol/L (ref 1.12–1.32)
Calcium, Ion: 1.15 mmol/L (ref 1.12–1.32)
Chloride: 108 mEq/L (ref 96–112)
Chloride: 109 mEq/L (ref 96–112)
HCT: 41 % (ref 39.0–52.0)
HCT: 41 % (ref 39.0–52.0)
Hemoglobin: 13.9 g/dL (ref 13.0–17.0)
Hemoglobin: 13.9 g/dL (ref 13.0–17.0)
Potassium: 3.5 mEq/L (ref 3.5–5.1)
TCO2: 23 mmol/L (ref 0–100)

## 2010-08-30 LAB — BLOOD GAS, ARTERIAL
Acid-base deficit: 0.1 mmol/L (ref 0.0–2.0)
Bicarbonate: 23.1 mEq/L (ref 20.0–24.0)
TCO2: 20.5 mmol/L (ref 0–100)
pCO2 arterial: 33.3 mmHg — ABNORMAL LOW (ref 35.0–45.0)
pH, Arterial: 7.452 — ABNORMAL HIGH (ref 7.350–7.450)

## 2010-08-30 LAB — DIFFERENTIAL
Basophils Absolute: 0.1 10*3/uL (ref 0.0–0.1)
Basophils Relative: 1 % (ref 0–1)
Lymphocytes Relative: 16 % (ref 12–46)
Lymphocytes Relative: 37 % (ref 12–46)
Lymphs Abs: 1.4 10*3/uL (ref 0.7–4.0)
Lymphs Abs: 3.7 10*3/uL (ref 0.7–4.0)
Monocytes Absolute: 0.3 10*3/uL (ref 0.1–1.0)
Monocytes Relative: 4 % (ref 3–12)
Neutro Abs: 5.1 10*3/uL (ref 1.7–7.7)
Neutro Abs: 6.7 10*3/uL (ref 1.7–7.7)
Neutrophils Relative %: 50 % (ref 43–77)
Neutrophils Relative %: 79 % — ABNORMAL HIGH (ref 43–77)

## 2010-08-30 LAB — CARBOXYHEMOGLOBIN
Carboxyhemoglobin: 1.1 % (ref 0.5–1.5)
Methemoglobin: 1.9 % — ABNORMAL HIGH (ref 0.0–1.5)
Total hemoglobin: 12.8 g/dL — ABNORMAL LOW (ref 13.5–18.0)

## 2010-08-30 LAB — PROTIME-INR
INR: 2.4 — ABNORMAL HIGH (ref 0.00–1.49)
INR: 2.9 — ABNORMAL HIGH (ref 0.00–1.49)
Prothrombin Time: 27.3 seconds — ABNORMAL HIGH (ref 11.6–15.2)

## 2010-09-04 LAB — CBC
HCT: 32.3 % — ABNORMAL LOW (ref 39.0–52.0)
HCT: 35.7 % — ABNORMAL LOW (ref 39.0–52.0)
HCT: 36.5 % — ABNORMAL LOW (ref 39.0–52.0)
HCT: 33.8 % — ABNORMAL LOW (ref 39.0–52.0)
HCT: 35.8 % — ABNORMAL LOW (ref 39.0–52.0)
HCT: 36.2 % — ABNORMAL LOW (ref 39.0–52.0)
HCT: 36.6 % — ABNORMAL LOW (ref 39.0–52.0)
HCT: 36.7 % — ABNORMAL LOW (ref 39.0–52.0)
HCT: 36.8 % — ABNORMAL LOW (ref 39.0–52.0)
HCT: 39.7 % (ref 39.0–52.0)
Hemoglobin: 11.1 g/dL — ABNORMAL LOW (ref 13.0–17.0)
Hemoglobin: 12.1 g/dL — ABNORMAL LOW (ref 13.0–17.0)
Hemoglobin: 12.3 g/dL — ABNORMAL LOW (ref 13.0–17.0)
Hemoglobin: 11.2 g/dL — ABNORMAL LOW (ref 13.0–17.0)
Hemoglobin: 11.7 g/dL — ABNORMAL LOW (ref 13.0–17.0)
Hemoglobin: 11.8 g/dL — ABNORMAL LOW (ref 13.0–17.0)
Hemoglobin: 12 g/dL — ABNORMAL LOW (ref 13.0–17.0)
Hemoglobin: 12.1 g/dL — ABNORMAL LOW (ref 13.0–17.0)
Hemoglobin: 12.2 g/dL — ABNORMAL LOW (ref 13.0–17.0)
Hemoglobin: 12.9 g/dL — ABNORMAL LOW (ref 13.0–17.0)
MCHC: 33.7 g/dL (ref 30.0–36.0)
MCHC: 33.8 g/dL (ref 30.0–36.0)
MCHC: 32.9 g/dL (ref 30.0–36.0)
MCHC: 33 g/dL (ref 30.0–36.0)
MCHC: 33 g/dL (ref 30.0–36.0)
MCHC: 33.2 g/dL (ref 30.0–36.0)
MCHC: 33.3 g/dL (ref 30.0–36.0)
MCHC: 33.3 g/dL (ref 30.0–36.0)
MCHC: 33.7 g/dL (ref 30.0–36.0)
MCV: 86.1 fL (ref 78.0–100.0)
MCV: 86.5 fL (ref 78.0–100.0)
MCV: 85.2 fL (ref 78.0–100.0)
MCV: 85.4 fL (ref 78.0–100.0)
MCV: 85.5 fL (ref 78.0–100.0)
MCV: 85.6 fL (ref 78.0–100.0)
MCV: 85.7 fL (ref 78.0–100.0)
MCV: 85.8 fL (ref 78.0–100.0)
MCV: 86.2 fL (ref 78.0–100.0)
Platelets: 212 10*3/uL (ref 150–400)
Platelets: 278 10*3/uL (ref 150–400)
Platelets: 291 10*3/uL (ref 150–400)
Platelets: 292 10*3/uL (ref 150–400)
Platelets: 262 10*3/uL (ref 150–400)
Platelets: 271 10*3/uL (ref 150–400)
Platelets: 287 10*3/uL (ref 150–400)
Platelets: 300 10*3/uL (ref 150–400)
Platelets: 300 10*3/uL (ref 150–400)
Platelets: 307 10*3/uL (ref 150–400)
Platelets: 313 10*3/uL (ref 150–400)
Platelets: 344 10*3/uL (ref 150–400)
RBC: 3.93 MIL/uL — ABNORMAL LOW (ref 4.22–5.81)
RBC: 4.14 MIL/uL — ABNORMAL LOW (ref 4.22–5.81)
RBC: 3.9 MIL/uL — ABNORMAL LOW (ref 4.22–5.81)
RBC: 4.12 MIL/uL — ABNORMAL LOW (ref 4.22–5.81)
RBC: 4.15 MIL/uL — ABNORMAL LOW (ref 4.22–5.81)
RBC: 4.16 MIL/uL — ABNORMAL LOW (ref 4.22–5.81)
RBC: 4.18 MIL/uL — ABNORMAL LOW (ref 4.22–5.81)
RBC: 4.19 MIL/uL — ABNORMAL LOW (ref 4.22–5.81)
RBC: 4.22 MIL/uL (ref 4.22–5.81)
RBC: 4.26 MIL/uL (ref 4.22–5.81)
RBC: 4.56 MIL/uL (ref 4.22–5.81)
RBC: 4.59 MIL/uL (ref 4.22–5.81)
RDW: 14 % (ref 11.5–15.5)
RDW: 14.6 % (ref 11.5–15.5)
RDW: 14.3 % (ref 11.5–15.5)
RDW: 14.5 % (ref 11.5–15.5)
RDW: 14.6 % (ref 11.5–15.5)
RDW: 14.6 % (ref 11.5–15.5)
RDW: 14.8 % (ref 11.5–15.5)
WBC: 14.7 10*3/uL — ABNORMAL HIGH (ref 4.0–10.5)
WBC: 7 10*3/uL (ref 4.0–10.5)
WBC: 8.5 10*3/uL (ref 4.0–10.5)
WBC: 10.1 10*3/uL (ref 4.0–10.5)
WBC: 13.4 10*3/uL — ABNORMAL HIGH (ref 4.0–10.5)
WBC: 7 10*3/uL (ref 4.0–10.5)
WBC: 7.4 10*3/uL (ref 4.0–10.5)
WBC: 7.9 10*3/uL (ref 4.0–10.5)
WBC: 8 10*3/uL (ref 4.0–10.5)
WBC: 8.7 10*3/uL (ref 4.0–10.5)
WBC: 9.1 10*3/uL (ref 4.0–10.5)
WBC: 9.9 10*3/uL (ref 4.0–10.5)

## 2010-09-04 LAB — HOMOCYSTEINE: Homocysteine: 9.7 umol/L (ref 4.0–15.4)

## 2010-09-04 LAB — URINE MICROSCOPIC-ADD ON

## 2010-09-04 LAB — COMPREHENSIVE METABOLIC PANEL
ALT: 21 U/L (ref 0–53)
ALT: 22 U/L (ref 0–53)
ALT: 24 U/L (ref 0–53)
AST: 27 U/L (ref 0–37)
Albumin: 2.1 g/dL — ABNORMAL LOW (ref 3.5–5.2)
Albumin: 2.2 g/dL — ABNORMAL LOW (ref 3.5–5.2)
Alkaline Phosphatase: 46 U/L (ref 39–117)
BUN: 13 mg/dL (ref 6–23)
Calcium: 8.1 mg/dL — ABNORMAL LOW (ref 8.4–10.5)
Chloride: 102 mEq/L (ref 96–112)
Chloride: 106 mEq/L (ref 96–112)
Creatinine, Ser: 1.21 mg/dL (ref 0.4–1.5)
Creatinine, Ser: 1.5 mg/dL (ref 0.4–1.5)
GFR calc Af Amer: >60 mL/min (ref >60)
Glucose, Bld: 82 mg/dL (ref 70–99)
Glucose, Bld: 161 mg/dL — ABNORMAL HIGH (ref 70–99)
Potassium: 3.5 mEq/L (ref 3.5–5.1)
Sodium: 135 mEq/L (ref 135–145)
Sodium: 136 mEq/L (ref 135–145)
Total Bilirubin: 0.6 mg/dL (ref 0.3–1.2)
Total Bilirubin: 1.1 mg/dL (ref 0.3–1.2)
Total Protein: 6.8 g/dL (ref 6.0–8.3)

## 2010-09-04 LAB — CK TOTAL AND CKMB (NOT AT ARMC): Relative Index: 3.8 — ABNORMAL HIGH (ref 0.0–2.5)

## 2010-09-04 LAB — BASIC METABOLIC PANEL
BUN: 17 mg/dL (ref 6–23)
BUN: 19 mg/dL (ref 6–23)
CO2: 24 mEq/L (ref 19–32)
CO2: 24 mEq/L (ref 19–32)
Chloride: 106 mEq/L (ref 96–112)
Creatinine, Ser: 0.95 mg/dL (ref 0.4–1.5)
Creatinine, Ser: 0.95 mg/dL (ref 0.4–1.5)
GFR calc Af Amer: >60 mL/min (ref >60)
GFR calc non Af Amer: >60 mL/min (ref >60)
Glucose, Bld: 142 mg/dL — ABNORMAL HIGH (ref 70–99)
Potassium: 3.6 mEq/L (ref 3.5–5.1)
Sodium: 138 mEq/L (ref 135–145)
Sodium: 136 mEq/L (ref 135–145)

## 2010-09-04 LAB — GLUCOSE, CAPILLARY
Glucose-Capillary: 100 mg/dL — ABNORMAL HIGH (ref 70–99)
Glucose-Capillary: 109 mg/dL — ABNORMAL HIGH (ref 70–99)
Glucose-Capillary: 118 mg/dL — ABNORMAL HIGH (ref 70–99)
Glucose-Capillary: 123 mg/dL — ABNORMAL HIGH (ref 70–99)
Glucose-Capillary: 136 mg/dL — ABNORMAL HIGH (ref 70–99)
Glucose-Capillary: 139 mg/dL — ABNORMAL HIGH (ref 70–99)
Glucose-Capillary: 162 mg/dL — ABNORMAL HIGH (ref 70–99)
Glucose-Capillary: 189 mg/dL — ABNORMAL HIGH (ref 70–99)
Glucose-Capillary: 76 mg/dL (ref 70–99)
Glucose-Capillary: 89 mg/dL (ref 70–99)
Glucose-Capillary: 91 mg/dL (ref 70–99)

## 2010-09-04 LAB — URINALYSIS, ROUTINE W REFLEX MICROSCOPIC
Bilirubin Urine: NEGATIVE
Nitrite: NEGATIVE
Specific Gravity, Urine: 1.021 (ref 1.005–1.030)
Urobilinogen, UA: 0.2 mg/dL (ref 0.0–1.0)
pH: 5.5 (ref 5.0–8.0)

## 2010-09-04 LAB — URINE CULTURE
Colony Count: NO GROWTH
Culture: NO GROWTH

## 2010-09-04 LAB — POCT CARDIAC MARKERS
CKMB, poc: 4.4 ng/mL (ref 1.0–8.0)
Myoglobin, poc: 462 ng/mL (ref 12–200)
Troponin i, poc: 0.18 ng/mL — ABNORMAL HIGH (ref 0.00–0.09)
Troponin i, poc: <0.05 ng/mL (ref 0.00–0.09)

## 2010-09-04 LAB — CULTURE, BLOOD (ROUTINE X 2)
Culture: NO GROWTH
Culture: NO GROWTH
Culture: NO GROWTH

## 2010-09-04 LAB — FACTOR 5 LEIDEN

## 2010-09-04 LAB — PROTIME-INR
INR: 1.4 (ref 0.00–1.49)
INR: 1.6 — ABNORMAL HIGH (ref 0.00–1.49)
INR: 2.1 — ABNORMAL HIGH (ref 0.00–1.49)
INR: 2.6 — ABNORMAL HIGH (ref 0.00–1.49)
Prothrombin Time: 18.2 seconds — ABNORMAL HIGH (ref 11.6–15.2)
Prothrombin Time: 27.2 seconds — ABNORMAL HIGH (ref 11.6–15.2)

## 2010-09-04 LAB — LEGIONELLA ANTIGEN, URINE

## 2010-09-04 LAB — HEPARIN LEVEL (UNFRACTIONATED)
Heparin Unfractionated: 0.14 IU/mL — ABNORMAL LOW (ref 0.30–0.70)
Heparin Unfractionated: 0.29 IU/mL — ABNORMAL LOW (ref 0.30–0.70)
Heparin Unfractionated: 0.42 IU/mL (ref 0.30–0.70)
Heparin Unfractionated: 0.5 IU/mL (ref 0.30–0.70)
Heparin Unfractionated: 0.71 IU/mL — ABNORMAL HIGH (ref 0.30–0.70)
Heparin Unfractionated: 0.73 IU/mL — ABNORMAL HIGH (ref 0.30–0.70)
Heparin Unfractionated: <0.1 IU/mL — ABNORMAL LOW (ref 0.30–0.70)

## 2010-09-04 LAB — BRAIN NATRIURETIC PEPTIDE
Pro B Natriuretic peptide (BNP): <30 pg/mL (ref 0.0–100.0)
Pro B Natriuretic peptide (BNP): <30 pg/mL (ref 0.0–100.0)

## 2010-09-04 LAB — DIFFERENTIAL
Eosinophils Relative: 1 % (ref 0–5)
Lymphocytes Relative: 12 % (ref 12–46)
Lymphs Abs: 1.6 10*3/uL (ref 0.7–4.0)
Monocytes Absolute: 0.8 10*3/uL (ref 0.1–1.0)
Monocytes Relative: 6 % (ref 3–12)
Neutro Abs: 10.9 10*3/uL — ABNORMAL HIGH (ref 1.7–7.7)

## 2010-09-04 LAB — LUPUS ANTICOAGULANT PANEL
DRVVT: 50 secs — ABNORMAL HIGH (ref 36.1–47.0)
PTTLA 4:1 Mix: 131.2 secs — ABNORMAL HIGH (ref 36.3–48.8)
PTTLA Confirmation: 9.1 secs — ABNORMAL HIGH (ref <8.0)

## 2010-09-04 LAB — PROTHROMBIN GENE MUTATION

## 2010-09-04 LAB — CARDIAC PANEL(CRET KIN+CKTOT+MB+TROPI): CK, MB: 2.3 ng/mL (ref 0.3–4.0)

## 2010-09-04 LAB — AMYLASE: Amylase: 38 U/L (ref 27–131)

## 2010-09-04 LAB — H1N1 SCREEN (PCR): H1N1 Virus Scrn: NOT DETECTED

## 2010-09-04 LAB — STREP PNEUMONIAE URINARY ANTIGEN: Strep Pneumo Urinary Antigen: NEGATIVE

## 2010-09-04 LAB — TROPONIN I: Troponin I: 0.53 ng/mL (ref 0.00–0.06)

## 2010-09-04 LAB — APTT: aPTT: 40 seconds — ABNORMAL HIGH (ref 24–37)

## 2010-09-25 ENCOUNTER — Encounter: Payer: Self-pay | Admitting: Internal Medicine

## 2010-09-25 ENCOUNTER — Ambulatory Visit (INDEPENDENT_AMBULATORY_CARE_PROVIDER_SITE_OTHER): Payer: Medicare Other | Admitting: Internal Medicine

## 2010-09-25 VITALS — BP 122/82 | HR 88 | Ht 72.0 in | Wt 262.0 lb

## 2010-09-25 DIAGNOSIS — K5150 Left sided colitis without complications: Secondary | ICD-10-CM

## 2010-09-25 DIAGNOSIS — Z86010 Personal history of colonic polyps: Secondary | ICD-10-CM

## 2010-09-25 DIAGNOSIS — K602 Anal fissure, unspecified: Secondary | ICD-10-CM

## 2010-09-25 MED ORDER — DILTIAZEM HCL POWD: Status: DC

## 2010-09-25 NOTE — Patient Instructions (Signed)
Diltiazem cream sent to [facility] for you to pick up. CBC every 2 months Follow-up with Dr. Henrene Pastor in 6 months.

## 2010-09-25 NOTE — Progress Notes (Signed)
HISTORY OF PRESENT ILLNESS:  Bill Fox is a 70 y.o. male with hypertension, hyperlipidemia, remote pulmonary embolus, right-sided adenomatous colon polyps, and a left-sided ulcerative colitis. Also a history of iron deficiency anemia related to his ulcerative colitis. Last evaluated in this office 04/30/2010. At that time his colitis was quiescent on 6 mercaptopurine and Asacol. His active problem at that time was an anal fissure. He was prescribed diltiazem cream and sitz baths. He was continued on his colitis medications without change. Every two-month CBCs have been stable including his last CBC 08/22/2010, which was normal. Since his last office visit he did complain of recurrent problems with fissure for which he was referred to a general surgeon, Dr. Rolm Bookbinder. He was treated medically. Patient reports no active colitis symptoms except for one day of diarrhea last week. No bleeding or abdominal pain. Some intermittent burning associated with defecation, though much better than previous. We discussed the proper way to use diltiazem cream.  REVIEW OF SYSTEMS:  All non-GI ROS negative except for shortness of breath with exertion and insomnia occasionally  Past Medical History  Diagnosis Date  . Ulcerative colitis   . Hyperlipidemia   . Morbid obesity   . Benign positional vertigo   . Hypertension   . Pulmonary embolism   . Coronary artery calcinosis     No past surgical history on file.  Social History Bill Fox  reports that he quit smoking about 17 years ago. His smoking use included Cigarettes. He has a 30 pack-year smoking history. He does not have any smokeless tobacco history on file. He reports that he drinks alcohol. He reports that he does not use illicit drugs.  family history includes Cancer in his father; Diabetes in his mother; and Lung cancer in his brother.  There is no history of Colon cancer.  No Known Allergies     PHYSICAL EXAMINATION: Vital  signs: BP 122/82  Pulse 88  Ht 6' (1.829 m)  Wt 262 lb (118.842 kg)  BMI 35.53 kg/m2  Constitutional: generally well-appearing but obese, no acute distress Psychiatric: alert and oriented x3, cooperative Eyes: extraocular movements intact, anicteric, conjunctiva pink Mouth: oral pharynx moist, no lesions Neck: supple no lymphadenopathy Cardiovascular: heart regular rate and rhythm, no murmur Lungs: clear to auscultation bilaterally Abdomen: soft, nontender, nondistended, no obvious ascites, no peritoneal signs, normal bowel sounds, no organomegaly Rectal: No external abnormalities. Granulation tissue posteriorly consistent with fissure Extremities: no lower extremity edema bilaterally Skin: no lesions on visible extremities Neuro: No focal deficits.    ASSESSMENT:  #1. Left-sided ulcerative colitis. Remains clinically quiescent on 6 mercaptopurine 150 mg daily and Asacol 1600 mg 3 times a day #2. History of iron deficiency anemia related to ulcerative colitis. Resolved #3. Anal fissure. Still present with intermittent symptoms, though significantly improved. #4. History of right-sided adenomatous colon polyp. Due for routine surveillance colonoscopy (due to colitis) around January 2013.   PLAN:  #1. Continue ulcerative colitis medications without change #2. Prescribed diltiazem cream to be applied 5 times daily to the anus for 6 weeks for recurrent problems with fissure #3. Routine office followup in 6 months. We can arrange his surveillance colonoscopy at that time. Contact the office in the interim for any questions or problems

## 2010-10-02 NOTE — Assessment & Plan Note (Signed)
[facility]                         GASTROENTEROLOGY OFFICE NOTE   CONG, HIGHTOWER                     MRN:          [id number]  DATE:08/05/2007                            DOB:          [birth year]    HISTORY:  Bill Fox presents today for followup.  He was evaluated in  the office July 06, 2007, for rectal bleeding, abdominal cramping,  increased intestinal gas and Hemoccult-positive stool.  He was also  noted to have iron deficiency anemia.  He had a prior history of  ulcerative proctitis.  His symptoms were not responsive to mesalamine  suppositories.  Thus, he underwent complete colonoscopy, July 24, 2007.  Examination revealed moderately severe diffuse colitis involving the  left colon.  The transverse colon, right colon and cecum were normal, as  was the ileum.  Biopsies from the proximal colon were normal.  Biopsies  from the left colon revealed ulcerative colitis without dysplasia.  He  was placed on Asacol 1600 mg t.i.d., prednisone 40 mg daily and iron 65  mg b.i.d.  He was also prescribed Rowasa enemas, though for some reason  they were not received at the pharmacy, rather additional mesalamine  suppositories.  However, he has not used these suppositories.  He  presents now for followup.  Patient tells me that, approximately three  days after his colonoscopy, his symptoms dramatically improved.  He  continues to have about one or two bowel movements per day, which are  formed.  He is no longer seeing blood.  He has not seen mucus.  His  abdominal discomfort and bloating have resolved.  He has had typical  prednisone side effects, including hyperphagia, jitteriness and mild  insomnia.   CURRENT MEDICATIONS INCLUDE:  1. Doxazosin 4 mg at night.  2. Prednisone 40 mg daily.  3. Asacol 1600 mg t.i.d.  4. Iron 65 mg b.i.d.  5. Mirtazapine at night.   PHYSICAL EXAM:  Reveals a well-appearing male, in no acute distress.  Blood  pressure is 136/80, heart rate is 92 and regular.  His weight is  248.4 pounds.  HEENT:  Sclerae are anicteric.  Conjunctivae are pink.  Oral mucosa is  intact.  LUNGS:  Clear.  HEART:  Regular.  ABDOMEN:  Soft without tenderness, mass or hernia.  Good bowel sounds  heard.   IMPRESSION:  1. Left-sided ulcerative colitis.  Patient's disease has progressed      from proctitis to left-sided colitis.  He has improved on the      current medical regimen.  2. Iron deficiency anemia, secondary to ulcerative colitis.   RECOMMENDATIONS:  1. Continue prednisone 40 mg daily for another week, then decrease to      prednisone 30 mg daily for two weeks, then prednisone 20 mg daily      for two weeks, then prednisone 10 mg daily for two weeks, then      stop.  2. Continue other medications without change.  3. Gastroenterology followup in four weeks.  4. Interval followup as needed.     Bill Chuck. Henrene Pastor, MD  Electronically Signed    JNP/MedQ  DD: 08/05/2007  DT: 08/05/2007  Job #: [id number]   cc:   Christena Deem. Melvyn Novas, MD, FCCP

## 2010-10-02 NOTE — Assessment & Plan Note (Signed)
[facility] OFFICE NOTE   Bill, Fox                     MRN:          [id number]  DATE:07/06/2007                            DOB:          [birth year]    HISTORY:  [name] presents today regarding rectal bleeding,  abdominal cramping, increased intestinal gas, and Hemoccult-positive  stool.  The patient has a history of adenomatous colon polyps as well as  idiopathic ulcerative proctitis, diagnosed in January of 2007.  For that  he was treated with mesalamine suppositories.  He was seen in followup  and found to be doing well.  Since that time he tells me he uses  suppositories on a fairly regular basis due to problems with bleeding.  He has also had some difficulties with gas and some mild abdominal  cramping.  No nausea, vomiting, or fevers.  He recently saw Dr. Melvyn Fox and  was found to have Hemoccult-positive stool.  Blood work was also  remarkable for an elevated sedimentation rate at 65 mm per hour.  He was  also found to have iron deficiency anemia with a hemoglobin of 11.8, and  an MCV of 77.8.   The patient's only medicine is doxazosin which he takes for  hypertension.   He has no known drug allergies.   He does use alprazolam, Viagra, and mesalamine suppositories p.r.n.   PHYSICAL EXAMINATION:  A well-appearing male in no acute distress.  His blood pressure is 136/82, heart rate is 84, weight is 248.2 pounds.  HEENT:  Sclerae anicteric, conjunctivae are pink, oral mucosa is intact,  no adenopathy.  LUNGS:  Clear.  HEART:  Regular.  ABDOMEN:  Soft without tenderness, mass or hernia, good bowel sounds  heard.  RECTAL EXAM:  Deferred.  EXTREMITIES:  Without edema.   IMPRESSION:  1. Ulcerative proctitis.  The patient seems to have had persistent      symptoms despite mesalamine suppositories.  One wonders if his      disease has progressed or is simply unresponsive.  2. Iron  deficiency anemia and elevated sedimentation rate most likely      due to active inflammatory bowel disease.  3. History of adenomatous colon polyps.   RECOMMENDATIONS:  1. Continue mesalamine suppositories for now.  Prescription with      multiple refills has been electronically submitted.  2. Schedule colonoscopy to evaluate the extent and severity of his      inflammatory bowel disease.  The nature of the procedure, as well      as the risks, benefits, and alternatives have      been reviewed.  He understood and agreed to proceed.  3. Recommend iron therapy after colonoscopy complete.     Docia Chuck. Henrene Pastor, MD  Electronically Signed    JNP/MedQ  DD: 07/06/2007  DT: 07/07/2007  Job #: [id number]   cc:   Christena Deem. Bill Novas, MD, FCCP

## 2010-10-02 NOTE — Discharge Summary (Signed)
NAMEDIVIT, STIPP              ACCOUNT NO.:  [id number]   MEDICAL RECORD NO.:  [id number]          PATIENT TYPE:  INP   LOCATION:  1521                         FACILITY:  [facility]   PHYSICIAN:  Clinton D. Annamaria Boots, MD, FCCP, FACPDATE OF BIRTH:  07-27-1942   DATE OF ADMISSION:  06/28/2008  DATE OF DISCHARGE:  07/03/2008                               DISCHARGE SUMMARY   DISCHARGE DIAGNOSES:  1. Pneumonia, organism, not otherwise specified.  2. Left-sided ulcerative colitis.  3. Blood in stool.  4. Hypertension, not otherwise specified.   BRIEF HISTORY:  A 70 year old man admitted from the pulmonary office by  Dr. Melvyn Novas after presenting with shortness of breath x3 days described as  intermittent with acute onset resolving within hour and not present at  the time of initial interview.  He denied chest pain, swelling,  orthopnea or postnasal drainage and denied any swelling or pain in  calves.  He was also complaining of right upper quadrant and flank pain  radiating through to the right side of the back.  He had a history of  inflammatory bowel disease with multiple flares of recent months and had  been started on prednisone 4 weeks prior to admission.  In the past  week, he had been having very frequent liquid stools with some nausea  and vomiting and anorexia.   SIGNIFICANT PAST HISTORY:  Included:  1. Ulcerative colitis.  2. Colonic polyps.  3. Hyperlipidemia.  4. Morbid obesity.  5. Benign positional vertigo.  6. Hypertension.   He was a former smoker.   INITIAL PHYSICAL EXAMINATION:  Showed a heart rate of 118 with BP  118/63, a temperature of 37.6, respirations at 28 with oxygen saturation  of 75% on room air.  He was alert and in no acute distress.  Oropharynx  was clear with no neck vein distention.  He was tachypneic at a rate of  22 to 25 per minute on 4 liter prongs with lungs clear to auscultation.  HEART:  Sounds were regular without murmur or gallop.  ABDOMEN:   Softly obese without obvious tenderness.  Bowel sounds were  hypoactive.  EXTREMITIES:  Warm and examiner found no calf tenderness, edema or  clubbing.   INITIAL LABS:  Included hemoglobin of 12.1, WBC of 14,900.  CPK of 476  with a CK index of 0.5 and troponin I of 0.02.  Chest x-ray showed  consolidated airspace in the right upper lung zone.   INITIAL IMPRESSION:  Community-acquired pneumonia for which she was  begun on ceftriaxone and azithromycin.  Appropriate cultures were  performed.  Hypertension was initially well controlled and doxazosin was  held.  Gastroenterology was consulted for his inflammatory bowel  disease.  Follow-up chest x-ray questioned cavitation in the right lung  consolidated area.  Chest CT showed cystic lesion without obvious  abscess and aeration was improving.  He was initially in the intensive  care unit then transferred to the floor as he stabilized.  Following  gastroenterology consultant guidance, prednisone was tapered to hold at  30 mg daily with cortisone enemas and Asacol and he was  directed to  follow up with Dr. Henrene Pastor as an outpatient.  On the last days of his  hospital stay, he described himself as feeling fine indicating a desire  to go home denying productive cough, dyspnea or pain.  Oxygen saturation  was 94% on 2 liters.  Bowel sounds were faint.  WBC was 7000 with  hemoglobin of 11.1.  Chest x-ray showed no change in right airspace  disease.  Cultures were negative.  At that time, he was on p.o. Avelox.  He was taken off of oxygen and had a saturation of 90% on room air on  day of discharge with BP 147/81, temperature 99.5.  Chest x-ray had  shown persistent prominent interstitial infiltrate in the right lung.  He was considered to have reached maximum hospital benefit, was desirous  of discharge home and was discharged home with his wife to outpatient  followup.   LABORATORY:  Initial WBC 14,900 with hemoglobin of 12.1, platelet count   187,000.  D-dimer is 2.07 on February 9.  Potassium fell to a nadir of  2.8 and was replenished.  Initial glucose 162 reflecting IV fluids and  steroids, subsequently controlled.  Amylase was 38, lipase 25.  Initial  liver enzymes included albumin of 2.2, magnesium of 2.6.  Initial CK 476  with MB 2.3, index of 0.5, troponin I of 0.02.  B natriuretic peptide  less than 30.  Blood cultures showed no growth.  Urine was negative for  growth and negative for Legionella serology as was streptococcal  antigen.  H1N1 screen was negative.  Admission chest CT with contrast  showed lobar pneumonia involving posterior aspect of the right upper  lobe, posterior aspect of the right middle lobe, no abscess with left  hilar adenopathy, likely reactive with followup recommended.  Extensive  calcified mediastinal lymph nodes and spleen were noted consistent with  prior granulomatous disease.  There was cholelithiasis and hepatic  steatosis.   DISCHARGE/PLAN:  Unrestricted diet consistent with his ulcerative  colitis.  To progress activity slowly.  He is to call Dr. Henrene Pastor for  gastroenterology followup in 2 to 3 weeks and Dr. Melvyn Novas for pulmonary  followup in 2 to 3 weeks.   DISCHARGE MEDICATIONS:  1. Avelox 400 mg x7 days.  2. Asacol 400 mg, 4 tablets, three times daily.  3. Doxazosin 8 mg at bedtime.  4. Mirtazapine 30 mg, 1/2 tablet before bed.  5. Viagra 50 mg as needed.  6. Prednisone 20 mg daily until GI followup.      Clinton D. Annamaria Boots, MD, [place] [facility], FACP  Electronically Signed     CDY/MEDQ  D:  07/29/2008  T:  07/30/2008  Job:  [id number]

## 2010-10-02 NOTE — Assessment & Plan Note (Signed)
[facility] OFFICE NOTE   Bill Fox, Bill Fox                     MRN:          [id number]  DATE:09/07/2007                            DOB:          [birth year]    HISTORY:  Bill Fox presents today for followup regarding management  of his left-sided ulcerative colitis.  He was last evaluated August 05, 2007.  See that dictation for details.  At that time, his symptoms were  significantly improved.  He was given a prednisone taper as directed.  He is currently on 20 mg of prednisone daily.  He continues on Asacol  1600 mg t.i.d.  Also iron supplement b.i.d.  At this time he states he  feels quite well.  Absolutely no gastrointestinal complaints.  He has  one formed bowel movement daily without blood.  No abdominal pain.  He  has gained weight.  No obvious medication side effects.   CURRENT MEDICATIONS:  1. Doxazosin 4 mg at night.  2. Prednisone 20 mg daily.  3. Asacol 1600 mg t.i.d.  4. Iron 65 mg b.i.d.  5. Mirtazapine 15 mg at night.  6. He also uses alprazolam p.r.n.  7. Viagra p.r.n.   PHYSICAL EXAMINATION:  Well appearing male in no acute distress.  He is  alert and oriented.  His blood pressure is 130/80.  Heart rate is 100,  regular.  Weight is 263.4 pounds (increased 15 pounds).  HEENT:  Sclerae anicteric.  Conjunctivae are pink.  Oral mucosa is  intact.  No adenopathy.  LUNGS:  Clear.  HEART:  Regular.  ABDOMEN:  Soft without tenderness, mass or hernia.  Good bowel sounds  heard.  EXTREMITIES:  Without edema.   IMPRESSION:  1. Left-sided ulcerative colitis.  Currently clinically quiescent on      Asacol and gradual prednisone taper.  2. History of iron deficiency anemia secondary to ulcerative colitis.   RECOMMENDATIONS:  1. Followup CBC today.  2. Continue prednisone taper.  3. Continue Asacol at current dosage.  4. Gastrointestinal office followup in three months.  He knows to  call      the office in the interim for any questions or problems.     Docia Chuck. Henrene Pastor, MD  Electronically Signed    JNP/MedQ  DD: 09/07/2007  DT: 09/07/2007  Job #: [id number]   cc:   Christena Deem. Melvyn Novas, MD, FCCP

## 2010-10-02 NOTE — Discharge Summary (Signed)
NAMESHIQUAN, MATHIEU              ACCOUNT NO.:  [id number]   MEDICAL RECORD NO.:  [id number]          PATIENT TYPE:  INP   LOCATION:  [place]                         FACILITY:  [facility]   PHYSICIAN:  Burnett Harry. Joya Gaskins, MD, FCCPDATE OF BIRTH:  03-Jan-1941   DATE OF ADMISSION:  07/06/2008  DATE OF DISCHARGE:  07/13/2008                               DISCHARGE SUMMARY   FINAL DIAGNOSES:  1. Acute pulmonary emboli.  2. History of ulcerative colitis with gastrointestinal blood loss.   BRIEF HISTORY:  Bill Fox is a pleasant 70 year old male patient  recently discharged from the hospital following treatment for a  community acquired pneumonia for which he rapidly improved and was  successfully discharged to home.  Unfortunately, after a few days at  home, he developed an acute syncopal episode, dyspnea and fall.  A  diagnostic evaluation demonstrated a small laceration to the scalp, but  more importantly a large bilateral pulmonary emboli.  He was admitted  for further therapy and evaluation.   HOSPITAL COURSE BY DISCHARGE DIAGNOSES:  1. Acute pulmonary emboli.  Bill Fox was admitted initially to      intensive care.  A gastroenterology consultation was obtained,      given history of slow GI bleed and ulcerative colitis.  We were      given permission to go ahead and initiate anticoagulation for      pulmonary emboli.  He was initiated in the usual fashion with      heparin, completing a 5-day bridge with Coumadin and heparin      therapy.  He also was three days therapeutic on Coumadin in INR      range between 2 and 3 prior to discharge.  At the time of      discharge, Bill Fox has returned to baseline pulmonary status.      He continues to improve physically, with a therapeutic INR of 2.6.      He will be discharged to home with follow up with nurse      practitioner, Rexene Edison, as well as Dr. Melvyn Novas.  Additionally, he      will be sent home on 5 mg daily Coumadin with close  follow up in      the Coumadin clinic, given his history of GI bleed.  2. Ulcerative colitis with positive slow gastrointestinal bleed.  Mr.      Fox will be discharged to home.  He does have follow up with      Dr. Henrene Pastor in the outpatient setting.   DISCHARGE LABORATORY DATA:  July 13, 2008 - hemoglobin 12.1,  hematocrit 36.7, white blood cell count 8.3, platelets 319.  Sodium 136,  potassium 3.8, chloride 106, CO2 of 24, BUN 12, creatinine 0.95, glucose  105.  INR 2.6 (this was obtained on July 13, 2008.  On July 12, 2008 it was 2.3, and on July 11, 2008, it was 2.1).   DISCHARGE INSTRUCTIONS:  1. Diet as tolerated.  2. Increase activity as tolerated.   DISCHARGE FOLLOW UP:  1. Coumadin Clinic on Friday, July 15, 2008.  2.  Dr. Henrene Pastor on July 25, 2008 at 9:45 a.m.  3. Dr. Melvyn Novas on July 20, 2008 at 9:00 a.m.   DISCHARGE MEDICATIONS:  1. Asacol 400 mg.  2. Lortab three times a day.  3. Prednisone 30 mg daily until July 25, 2008, when he will be seen      again by Dr. Henrene Pastor.  4. Mirtazapine 30 mg half tablet daily at bedtime.  5. Doxazosin 8 mg half tablet daily at bedtime.  6. Coumadin 2.5 mg, take two tablets daily, and then as directed by      pharmacy.  7. Viagra 50 mg as needed.   DISCHARGE EXAMINATION:  GENERAL:  Bill Fox is alert and oriented.  VITAL SIGNS:  Stable.  Saturation is in the high 90s.  HEENT:  No JVD or adenopathy.  PULMONARY:  Clear to auscultation.  CARDIAC:  Regular rate and rhythm.  EXTREMITIES:  Without edema.   DISPOSITION:  He has now met maximum benefit from inpatient stay.  He is  cleared to discharge to home.      Bill Dom, Bill Fox      Burnett Harry. Joya Gaskins, MD, [facility]  Electronically Signed    PB/MEDQ  D:  07/13/2008  T:  07/13/2008  Job:  [id number]

## 2010-10-05 NOTE — Assessment & Plan Note (Signed)
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  He dates the onset of the weight gain to when he stopped smoking but overall  feels better despite the weight gain since smoking cessation 10 years ago.  He denies any exertional chest pain, orthopnea, PND or leg swelling.   PAST MEDICAL HISTORY:  1. 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  2. Microscopic hematuria, evaluated in 1999.  3. Hypertension.  4. Hyperlipidemia, target LDL less than 130 based on hypertension and male      gender as well as previous smoking history.   ALLERGIES:  NO KNOWN DRUG ALLERGIES.   CURRENT MEDICATIONS:  Consist only of doxazosin 8 mg one half daily and  Remeron 30 mg one half nightly.   SOCIAL HISTORY:  He quit smoking 10 years ago.  He works as a Surveyor, quantity.  He is a former marine.  He rarely uses alcohol.   FAMILY HISTORY:  Positive for diabetes in his mother and two brothers.  He  is the third of six children who have no premature heart disease or cancer  of the prostate or colon to his knowledge.   REVIEW OF SYSTEMS:  Taken in detail on the worksheet and significant only  for the problems as outlined above.  He is short of breath with exertion but  this is a chronic problem and been directly proportionate to weight gain.   PHYSICAL EXAMINATION:  GENERAL APPEARANCE:  Obese white male now 268 pounds  versus 219 a year ago.  VITAL SIGNS:  Blood pressure 130/88 after taking his medicines this morning.  HEENT:  He has a full upper and partial lower set of dentures.  Ear canals  clear bilaterally without any  significant wax.  NECK:  Supple without cervical adenopathy or tenderness.  Carotid upstrokes  are very brisk without any bruits.  Trachea is midline with no thyromegaly.  LUNGS:  Lung fields are perfectly clear bilaterally to auscultation and  percussion with excellent air movement.  CARDIOVASCULAR:  There is regular rate and rhythm without murmurs, rubs, or  gallops.  S1 and S2 were diminished.  No displacement of PMI or increased  P2.  ABDOMEN:  Obese, benign with no palpable organomegaly, masses or tenderness.  Femoral pulses were present, no bruits.  EXTREMITIES:  Warm without calf tenderness, clubbing, cyanosis, or edema.  Pedal pulses were present bilaterally except for slightly decreased right  dorsalis pedis.  GENITOURINARY:  Testes descended bilaterally with no nodules.  RECTAL:  Revealed mild BPH.  Smooth.  Stool guaiac was negative.  SKIN:  Warm and dry with no lesions.  NEUROLOGIC:  No focal deficits. __________  MUSCULOSKELETAL:  Unremarkable.  LABORATORY DATA:  Chest x-ray revealed mild cardiomegaly.   Urinalysis revealed traces of RBCs, no significant protein. CRP was 6.  CBC  was normal.  Blood sugar was 117.  LDL cholesterol was 127 with an HDL of  26.   IMPRESSION:  1. Progressive weight gain associated with  hypertension that is not at      goal.  I believe he would benefit from being on additional      antihypertensives considering that doxazosin by itself has not shown to      change long-term morbidity and mortality from hypertension and would      rather he be on an ACE or an ARB.  Will discuss this in the next visit.  2. Persistent microscopic hematuria with previous work-up by Dr. Risa Grill in      1999.  3. Idiopathic proctitis with colon polyps followed by Dr. Henrene Pastor in the      computer for recall.  4. Chronic fatigue with improved sleep hygiene on Remeron which I have      asked him to continue.  Note his TSH has been documented multiple      times,  again today normal.  5. Wax retention left ear, moderate in nature with Cerumenex recommended.  6. Health maintenance:  He was updated on tetanus in 2004 and Pneumovax in      2002.  7. Hyperlipidemia.  Note he has a marked decrease in HDL rather than the      LDL related to poor exercise hygiene.  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  I told him he stands significant risk for long-term morbidity and      mortality related to obesity including possibly developing diabetes,      which runs in  his family, especially noting that his fasting blood      sugar is slightly elevated, he is definitely at risk for developing.  I      will ask him to return every three months for monitoring, sooner if      needed.                                   Christena Deem. Melvyn Novas, MD, [facility]   MBW/MedQ  DD:  01/02/2006  DT:  01/02/2006  Job #:  [id number]

## 2010-10-05 NOTE — Assessment & Plan Note (Signed)
[facility]                               PULMONARY OFFICE NOTE   MIECZYSLAW, STAMAS                     MRN:          [id number]  DATE:01/15/2006                            DOB:          02/25/1941    HISTORY:  A 70 year old white male with morbid obesity complicated by  hypertension, comes in with new onset dizziness that has waxed and waned,  worse when he gets out of bed but not directly related to orthostatic  position, and also not necessarily worse with supine position or turning.  The sensation is 1 of unsteadiness  rather than classic vertigo.  He  denies any nausea or other ocular complaints such as diplopia or decreased  vision, ataxia, or dysarthria, weakness, or numbness, or significant  headache.   PHYSICAL EXAMINATION:  GENERAL:  He is a pleasant overweight white male in  no acute distress.  He looks tired, but certainly not acutely ill.  VITAL SIGNS:  He is afebrile.  HEENT:  Is remarkable for very minimal nystagmus provoked both lying down  and sitting up.  Neck is supple without cervical adenopathy, tenderness.  Carotid upstrokes are  brisk without bruits.  CHEST:  Was clear bilaterally to auscultation and percussion.  Regular rate  and rhythm, without abnormal gallop or rub.  ABDOMEN:  Soft, benign.  EXTREMITIES: Warm.  Without calf tenderness, cyanosis, clubbing.   IMPRESSION:  Is acute onset vertigo with no evidence of other neurologic  signs or orthostatic symptoms while taking chronically doxazosin 8 mg one-  half every day.  He does complain of chronic fatigue and has been sleeping  very poorly.  Therefore, I am going to recommend increasing Remeron to 30 mg  one every H.S.  To treat his vertigo acutely, I am going to recommend a  trial of meclizine 25 mg 1 every 4 hours.  1. Extensive retain was on the left.  I do not think this has anything to      do with the vertigo.  I did recommend that he return within 2 weeks  for      recheck on the vertigo issue, and also have his ears cleaned.  I have      advised him to try Cerumenex in the meantime.   I have warned him against any other neurologic symptoms such as diplopia,  ataxia, or dysarthria to go to immediately to the emergency room.  In the  meantime, I do think it would be reasonable for him to take a baby aspirin  prophylactically indefinitely.                                   Christena Deem. Melvyn Novas, MD, [facility]   MBW/MedQ  DD:  01/15/2006  DT:  01/16/2006  Job #:  [id number]

## 2010-10-05 NOTE — Assessment & Plan Note (Signed)
[facility]                             PULMONARY OFFICE NOTE   SHJON, LIZARRAGA                       MRN:          [id number]  DATE:05/15/2006                            DOB:          19-Apr-1941    HISTORY OF PRESENT ILLNESS:  Patient is a very pleasant 70 year old male  patient of Dr. Gustavus Bryant who has a known history of hypertension,who  presents for an acute office visit.  Patient complains of dizziness.  Patient complains that he feels somewhat lightheaded when he changes  positions, especially when he turns his head sideway or stands up from a  sitting position or rises up in the morning after sleeping. Patient  denies any visual changes, chest pain, palpitations, presyncopal or  syncopal episodes, extremity weakness, or visual or speech changes.  Patient has had suspected vertigo in the past and has used meclizine,  which he reports did help; however, has not taken any for this episode.   PAST MEDICAL HISTORY:  Reviewed.   CURRENT MEDICATIONS:  Reviewed.   PHYSICAL EXAMINATION:  Patient is a pleasant male in no acute distress.  He is afebrile with stable vital signs. O2 saturation is 94% on room  air.  HEENT:  PERRLA. EOMI without nystagmus. Facial features are symmetrical.  Posterior pharynx clear. TMs are normal. EACs are clear.  Neck is supple without adenopathy. No JVD.  Lung sounds are clear.  Cardiac is a regular rate.  Abdomen is soft and benign.  Extremities are warm without any edema.  NEURO: Alert and oriented times three. Cranial nerves 2-12 are intact.  No focal deficits are detected. Equal strength of the upper and lower  extremities. Steady gait. Head maneuvers are with reproducible symptoms,  however, negative for nystagmus.   IMPRESSION AND PLAN:  Dizziness suspect is underlying vertigo. Patient  is to increase his fluid intake. Change position slowly and may use  meclizine 25 mg a half to a whole every 4-6  hours as  needed for dizziness. Patient will recheck with Dr. Melvyn Novas in 4  weeks or sooner if needed.     Rexene Edison, NP  Electronically Signed      Christena Deem. Melvyn Novas, MD, [facility]  Electronically Signed   TP/MedQ  DD: 05/19/2006  DT: 05/19/2006  Job #: [id number]

## 2010-10-05 NOTE — Assessment & Plan Note (Signed)
[facility]                             PULMONARY OFFICE NOTE   Bill Fox, Bill Fox                     MRN:          [id number]  DATE:05/30/2006                            DOB:          07-22-1940    This is a 70 year old white male with intermittent vertigo dating back  years with an acute exacerbation in August that improved transiently  with meclizine and ear cleaning.  He comes back now worse than ever  since he has been doing treadmill at home since [place].  He is still  improved somewhat with meclizine, but not as much as it did previously,  and has been continuous over the last 2 days.  He denies any associated  nausea.  The sensation is more of a head swimming than it is a true  spinning.  He has no other neurologic complaints or visual complaints.   MEDICATIONS:  1. Remeron 30 mg 1 at bedtime.  2. Baby aspirin daily.  3. Doxazosyn  8 mg 1/2 nightly.   PHYSICAL EXAMINATION:  He is a depressed and somewhat anxious-appearing  ambulatory white male in no acute distress.  Afebrile vital signs, but  no orthostatic changes noted.  HEENT:  Reveals minimal nystagmus that was provoked in the supine  position, otherwise the HEENT exam was normal with normal ear canals,  nasal turbinates, and oropharynx.  Cranial nerves II-XII are intact.  Hearing was grossly intact.  The carotid upstrokes were brisk  bilaterally without any bruits.  There was a regular rate and rhythm with no ectopics noted.  Lung fields were clear.  ABDOMEN:  Soft, benign.  EXTREMITIES:  Warm without calf tenderness, cyanosis, clubbing.  NEUROLOGIC EXAM:  Normal, including ambulation.   IMPRESSION:  Benign position vertigo, although a bit unusual in terms of  its pattern now, and refractory to meclizine.  I suspect this was  provoked by using a treadmill.  I have asked him to stop this for now,  and asked him to continue to use meclizine up to 50 mg every 4-6 hours,  and  add Xanax to this at 0.5 mg q.4-6 h. p.r.n. as well.   I have arranged for him to see neurology in followup, and will also  arrange for a CT scan of the head to make sure there is nothing more  sinister causing his dizziness.  I have advised him to go to the  emergency room if his condition worsens in any way on the above regimen.  Awaiting consultation.     Christena Deem. Melvyn Novas, MD, [facility]  Electronically Signed    MBW/MedQ  DD: 05/30/2006  DT: 05/31/2006  Job #: [id number]

## 2010-10-25 ENCOUNTER — Other Ambulatory Visit: Payer: Self-pay | Admitting: *Deleted

## 2010-10-25 ENCOUNTER — Other Ambulatory Visit (INDEPENDENT_AMBULATORY_CARE_PROVIDER_SITE_OTHER): Payer: Medicare Other

## 2010-10-25 DIAGNOSIS — K5190 Ulcerative colitis, unspecified, without complications: Secondary | ICD-10-CM

## 2010-10-25 DIAGNOSIS — D509 Iron deficiency anemia, unspecified: Secondary | ICD-10-CM

## 2010-10-25 LAB — CBC WITH DIFFERENTIAL/PLATELET
Basophils Relative: 0.7 % (ref 0.0–3.0)
Eosinophils Relative: 2.4 % (ref 0.0–5.0)
HCT: 41 % (ref 39.0–52.0)
MCV: 94.7 fl (ref 78.0–100.0)
Monocytes Absolute: 0.5 10*3/uL (ref 0.1–1.0)
Neutrophils Relative %: 61.2 % (ref 43.0–77.0)
RBC: 4.33 Mil/uL (ref 4.22–5.81)
WBC: 5.6 10*3/uL (ref 4.5–10.5)

## 2010-10-26 ENCOUNTER — Telehealth: Payer: Self-pay | Admitting: Internal Medicine

## 2010-10-26 NOTE — Telephone Encounter (Signed)
Pt aware of lab results per Dr. Henrene Pastor. Pt to repeat labs in 2 months. Pt is aware.

## 2010-11-05 ENCOUNTER — Telehealth: Payer: Self-pay | Admitting: Internal Medicine

## 2010-11-05 NOTE — Telephone Encounter (Signed)
Pt states he went to ER for back pain on 11-02-10. He states he was given 3 day course of prednisone and told to f/u with PCP. Pt set to see MW on 11-07-10. Manton Bing, CMA

## 2010-11-07 ENCOUNTER — Ambulatory Visit (INDEPENDENT_AMBULATORY_CARE_PROVIDER_SITE_OTHER): Payer: Medicare Other | Admitting: Internal Medicine

## 2010-11-07 ENCOUNTER — Encounter: Payer: Self-pay | Admitting: Internal Medicine

## 2010-11-07 VITALS — BP 124/70 | HR 66 | Temp 98.1°F | Ht 72.0 in | Wt 266.0 lb

## 2010-11-07 DIAGNOSIS — E785 Hyperlipidemia, unspecified: Secondary | ICD-10-CM

## 2010-11-07 DIAGNOSIS — M549 Dorsalgia, unspecified: Secondary | ICD-10-CM

## 2010-11-07 DIAGNOSIS — I10 Essential (primary) hypertension: Secondary | ICD-10-CM

## 2010-11-07 NOTE — Progress Notes (Signed)
  Subjective:    Patient ID: [name], male    DOB: 1940/10/14, 70 y.o.   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   Admit 2/17 > 24/2010 admit with PE ok for coumadin by GI Henrene Pastor), presented with sob/ syncope   July 20, 2008 first post hosp fu for PE/ Left DVT ov no cp or sob, no rectal bleeding despite coumadin.   June 13, 2009 cpx, no change in rx   Sep 26, 2009 cc coronary calcifications on ct of kidneys and rectal pain with bm's but no bleeding.   December 25, 2009--Presents for an acute office visit. Complains of burning in right thigh from knee to hip x16month progressively getting worse. Describes a burning sensation along outer/mid thigh w/ prickling sensation that comes and goes.  Dx LFC syndrome  08/15/2010  cpx no nex c/os x cataracts worse and need to be removed per ophtamologist.    11/07/2010 ov/ Artyom Stencel cc new onset midline  low back pain abrupt onset x  One week no obvious injury, no pain down legs, [facility] with [facility] showing DJD  rx pred/ vicodin and improved.  Not using much aleve.  No problem with gait or pain with cough.  Pt denies any significant sore throat, dysphagia, itching, sneezing,  nasal congestion or excess/ purulent secretions,  fever, chills, sweats, unintended wt loss, pleuritic or exertional cp, hempoptysis, orthopnea pnd or leg swelling.    Also denies any obvious fluctuation of symptoms with weather or environmental changes or other aggravating or alleviating factors.      Past Medical History:  ULCERATIVE COLITIS....................................................................................Marland KitchenHenrene Pastor/ W[place]ne Hazel+ COLONIC POLYPS, HX OF (ICD-V12.72)  - colonoscopy 07/24/2007  - colonoscopy 06/08/09: mucosal changes consistent with moderately severe  left-sided ulcerative colitis involving the rectum to distal transverse  colon confluent  HYPERLIPIDEMIA (ICD-272.4)  - target LDL < 130 (pos HBP, male, former smoker)  MORBID OBESITY (ICD-278.01)  - ideal = 168 target 208  - Did not keep nutrition appt 07/04/09  BENIGN POSITIONAL VERTIGO, HX OF (ICD-V12.49)  HYPERTENSION (ICD-401.9)  Microsocopic Hematuria, neg w/u 1999, resent to Urology August 03, 2009 >>> negative  PULMONARY EMBOLISM 07/06/2008  - Left DVT by venous doppler 07/07/2008 (asymptomatic) > neg venous doppler 11/01/08  - nl ECHO 07/08/08  HEALTH MAINTENANCE...............................................................................Marland Kitchenert  - Pneumovax 06/20/2008  - Td 06/2002  - CPX  08/15/2010  coronary calcifications ? significance 08/2009               Objective:   Physical Exam   wt 265 June 28, 2008 > 260 06/13/09 > 278 Sep 26, 2009 >>  266 11/07/2010  Ambulatory somber wm appearing in no acute distress.  HEENT: bottom teeth gone, upper partials in place, and orophanx clear  Neck without JVD/Nodes/TM.  Lungs clear to A and P bilaterally without cough on insp or exp maneuvers  RRR no s3 or murmur or increase in P2 No edema  Abd soft obese no tenderness.-large panniculus.  Ext warm without calf tenderness, cyanosis clubbing or edema  Neuro mood/affect flat, somber nl sensorium, nl gait, no focal sensory, motor, cerebellar deficits noted, SLR neg. equal strength    Assessment & Plan:

## 2010-11-07 NOTE — Patient Instructions (Addendum)
Finish the prednisone  Take aleve with meals if pain flares  Only take the vicodin if pain is intolerable   Please see patient coordinator before you leave today  to schedule orthopedic evaluation  Please schedule a follow up visit in 3 months but call sooner if needed

## 2010-11-09 ENCOUNTER — Encounter: Payer: Self-pay | Admitting: Internal Medicine

## 2010-11-09 NOTE — Assessment & Plan Note (Signed)
No features to suggest radiculopathy but concerned based on body habitus this will become a longterm issue.  Reviewed approp use of nsaids and opioids in this setting.  Prednisone seems to have helped but really not a good longterm solution so refer to ortho for eval

## 2010-11-09 NOTE — Assessment & Plan Note (Signed)
Ok on rx 

## 2010-11-23 ENCOUNTER — Other Ambulatory Visit: Payer: Self-pay | Admitting: *Deleted

## 2010-11-23 MED ORDER — DOXAZOSIN MESYLATE 8 MG PO TABS: ORAL_TABLET | ORAL | Status: DC

## 2010-12-18 ENCOUNTER — Telehealth: Payer: Self-pay | Admitting: Internal Medicine

## 2010-12-18 ENCOUNTER — Other Ambulatory Visit: Payer: Self-pay | Admitting: Internal Medicine

## 2010-12-18 MED ORDER — MERCAPTOPURINE 50 MG PO TABS: 50.0000 mg | ORAL_TABLET | Freq: Every day | ORAL | Status: DC

## 2010-12-18 NOTE — Telephone Encounter (Signed)
Medication filled by Dr Henrene Pastor.  Called spoke with patient - he states that he has already called Dr Blanch Media office for refills.  Will sign off on message.

## 2010-12-18 NOTE — Telephone Encounter (Signed)
Rx sent to pharmacy   

## 2010-12-19 ENCOUNTER — Other Ambulatory Visit (INDEPENDENT_AMBULATORY_CARE_PROVIDER_SITE_OTHER): Payer: Medicare Other

## 2010-12-19 DIAGNOSIS — D508 Other iron deficiency anemias: Secondary | ICD-10-CM

## 2010-12-19 LAB — CBC WITH DIFFERENTIAL/PLATELET
Basophils Relative: 0.2 % (ref 0.0–3.0)
Eosinophils Absolute: 0.1 10*3/uL (ref 0.0–0.7)
Lymphs Abs: 1.2 10*3/uL (ref 0.7–4.0)
MCHC: 33.4 g/dL (ref 30.0–36.0)
MCV: 94.8 fl (ref 78.0–100.0)
Monocytes Absolute: 0.5 10*3/uL (ref 0.1–1.0)
Neutro Abs: 3.7 10*3/uL (ref 1.4–7.7)
Neutrophils Relative %: 68 % (ref 43.0–77.0)
RBC: 4.52 Mil/uL (ref 4.22–5.81)

## 2010-12-20 ENCOUNTER — Other Ambulatory Visit: Payer: Self-pay | Admitting: Internal Medicine

## 2010-12-20 ENCOUNTER — Telehealth: Payer: Self-pay

## 2010-12-20 DIAGNOSIS — D649 Anemia, unspecified: Secondary | ICD-10-CM

## 2010-12-20 NOTE — Telephone Encounter (Signed)
Message left for pt regarding lab results.

## 2010-12-20 NOTE — Telephone Encounter (Signed)
Message copied by Faythe Casa on Thu Dec 20, 2010 10:24 AM ------      Message from: Irene Shipper      Created: Thu Dec 20, 2010 10:07 AM       Let pt know cbc is ok. Repeat in 3 months

## 2010-12-26 ENCOUNTER — Telehealth: Payer: Self-pay | Admitting: Internal Medicine

## 2010-12-26 MED ORDER — DOXAZOSIN MESYLATE 8 MG PO TABS: ORAL_TABLET | ORAL | Status: DC

## 2010-12-26 MED ORDER — MIRTAZAPINE 30 MG PO TABS: 30.0000 mg | ORAL_TABLET | Freq: Every day | ORAL | Status: DC

## 2010-12-26 NOTE — Telephone Encounter (Signed)
Spoke with pharmacist and she states that she is needing verbal order for cardura and remeron. Gave verbal okay for 90 day supply of each with 3 refills. Nothing further needed.

## 2011-01-02 ENCOUNTER — Other Ambulatory Visit: Payer: Self-pay | Admitting: Internal Medicine

## 2011-01-29 ENCOUNTER — Encounter: Payer: Self-pay | Admitting: Internal Medicine

## 2011-01-29 ENCOUNTER — Ambulatory Visit (INDEPENDENT_AMBULATORY_CARE_PROVIDER_SITE_OTHER): Payer: Medicare Other | Admitting: Internal Medicine

## 2011-01-29 DIAGNOSIS — I10 Essential (primary) hypertension: Secondary | ICD-10-CM

## 2011-01-29 DIAGNOSIS — M549 Dorsalgia, unspecified: Secondary | ICD-10-CM

## 2011-01-29 NOTE — Assessment & Plan Note (Signed)
Improved, reviewed prn nsaids    Each maintenance medication was reviewed in detail including most importantly the difference between maintenance and as needed and under what circumstances the prns are to be used.  Please see instructions for details which were reviewed in writing and the patient given a copy.

## 2011-01-29 NOTE — Assessment & Plan Note (Signed)
Adequate control on present rx, reviewed  

## 2011-01-29 NOTE — Progress Notes (Signed)
Subjective:    Patient ID: [name], male    DOB: 05/07/1941, 70 y.o.   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   Admit 2/17 > 24/2010 admit with PE ok for coumadin by GI Henrene Pastor), presented with sob/ syncope   July 20, 2008 first post hosp fu for PE/ Left DVT ov no cp or sob, no rectal bleeding despite coumadin.   June 13, 2009 cpx, no change in rx   Sep 26, 2009 cc coronary calcifications on ct of kidneys and rectal pain with bm's but no bleeding.   December 25, 2009--Presents for an acute office visit. Complains of burning in right thigh from knee to hip x49month progressively getting worse. Describes a burning sensation along outer/mid thigh w/ prickling sensation that comes and goes.  Dx LFC syndrome  08/15/2010  cpx no nex c/os x cataracts worse and need to be removed per ophtamologist.    11/07/2010 ov/ Deshon Hsiao cc new onset midline  low back pain abrupt onset x  One week no obvious injury, no pain down legs, [facility] with xrays showing DJD  rx pred/ vicodin and improved.  Not using much aleve.  No problem with gait or pain with cough. 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   Pt denies any significant sore throat, dysphagia, itching, sneezing,  nasal congestion or excess/ purulent secretions,  fever, chills, sweats, unintended wt loss, pleuritic or exertional cp, hempoptysis, orthopnea pnd or leg swelling.    Also denies any obvious fluctuation of symptoms with weather or environmental changes or other aggravating or alleviating factors.       Past Medical History:  ULCERATIVE COLITIS....................................................................................Marland KitchenHenrene Pastor/ W[facility]el+ COLONIC POLYPS, HX OF (ICD-V12.72)  - colonoscopy 07/24/2007  - colonoscopy 06/08/09: mucosal changes consistent with moderately severe  left-sided ulcerative colitis involving the rectum to distal transverse colon confluent  HYPERLIPIDEMIA (ICD-272.4)  - target LDL < 130 (pos HBP, male, former smoker)  MORBID OBESITY (ICD-278.01)  - ideal = 168 target 208  - Did not keep nutrition appt 07/04/09  BENIGN POSITIONAL VERTIGO, HX OF (ICD-V12.49)  HYPERTENSION (ICD-401.9)  Microsocopic Hematuria, neg w/u 1999, resent to Urology August 03, 2009 >>> negative  PULMONARY EMBOLISM 07/06/2008  - Left DVT by venous doppler 07/07/2008 (asymptomatic) > neg venous doppler 11/01/08  - nl ECHO 07/08/08  HEALTH MAINTENANCE...............................................................................Marland Kitchenert  - Pneumovax 06/20/2008  - Td 06/2002  - CPX  08/15/2010  coronary calcifications ? significance 08/2009               Objective:   Physical Exam   wt 265 June 28, 2008 > 260 06/13/09 > 278 Sep 26, 2009 >>  266 11/07/2010 > 261 01/29/2011  Ambulatory somber wm appearing in no acute distress.  HEENT: bottom teeth gone, upper partials in place, and orophanx clear  Neck without JVD/Nodes/TM.  Lungs clear to A and P bilaterally without cough on insp or exp maneuvers  RRR no s3 or murmur or increase in P2 No edema  Abd soft obese no  tenderness.-large panniculus.  Ext warm without calf tenderness, cyanosis clubbing or edema  Neuro mood/affect flat, somber nl sensorium, nl gait, no focal sensory, motor, cerebellar deficits noted, SLR neg. equal strength    Assessment & Plan:

## 2011-01-29 NOTE — Patient Instructions (Signed)
Return after March 28th 2013 for CPX, call sooner if needed  Ok to use aleve as long as you take it meals per the bottle instructions but try not to take daily

## 2011-03-25 ENCOUNTER — Other Ambulatory Visit (INDEPENDENT_AMBULATORY_CARE_PROVIDER_SITE_OTHER): Payer: Medicare Other

## 2011-03-25 ENCOUNTER — Telehealth: Payer: Self-pay

## 2011-03-25 DIAGNOSIS — D649 Anemia, unspecified: Secondary | ICD-10-CM

## 2011-03-25 LAB — CBC WITH DIFFERENTIAL/PLATELET
Basophils Absolute: 0 10*3/uL (ref 0.0–0.1)
Basophils Relative: 0.5 % (ref 0.0–3.0)
Eosinophils Absolute: 0.1 10*3/uL (ref 0.0–0.7)
Hemoglobin: 14 g/dL (ref 13.0–17.0)
Lymphocytes Relative: 23.5 % (ref 12.0–46.0)
MCHC: 33.7 g/dL (ref 30.0–36.0)
Monocytes Relative: 10.3 % (ref 3.0–12.0)
Neutrophils Relative %: 63.9 % (ref 43.0–77.0)
Platelets: 235 10*3/uL (ref 150.0–400.0)
RBC: 4.3 Mil/uL (ref 4.22–5.81)
RDW: 16 % — ABNORMAL HIGH (ref 11.5–14.6)
WBC: 4.9 10*3/uL (ref 4.5–10.5)

## 2011-03-25 NOTE — Telephone Encounter (Signed)
Pt aware.

## 2011-03-25 NOTE — Telephone Encounter (Signed)
Message copied by Faythe Casa on Mon Mar 25, 2011  1:02 PM ------      Message from: Irene Shipper      Created: Mon Mar 25, 2011 11:44 AM       The patient noted his CBC looks fine. Repeat CBC in 3 months

## 2011-06-04 ENCOUNTER — Encounter: Payer: Self-pay | Admitting: Internal Medicine

## 2011-06-25 ENCOUNTER — Other Ambulatory Visit (INDEPENDENT_AMBULATORY_CARE_PROVIDER_SITE_OTHER): Payer: Medicare Other

## 2011-06-25 ENCOUNTER — Telehealth: Payer: Self-pay

## 2011-06-25 DIAGNOSIS — K5190 Ulcerative colitis, unspecified, without complications: Secondary | ICD-10-CM

## 2011-06-25 DIAGNOSIS — D649 Anemia, unspecified: Secondary | ICD-10-CM

## 2011-06-25 LAB — CBC WITH DIFFERENTIAL/PLATELET
Basophils Absolute: 0 10*3/uL (ref 0.0–0.1)
Basophils Relative: 0.7 % (ref 0.0–3.0)
Hemoglobin: 14 g/dL (ref 13.0–17.0)
Lymphocytes Relative: 19 % (ref 12.0–46.0)
Monocytes Relative: 10.1 % (ref 3.0–12.0)
Neutro Abs: 3.9 10*3/uL (ref 1.4–7.7)
Neutrophils Relative %: 68 % (ref 43.0–77.0)
RBC: 4.28 Mil/uL (ref 4.22–5.81)

## 2011-06-25 NOTE — Telephone Encounter (Signed)
Message copied by Faythe Casa on Tue Jun 25, 2011  1:32 PM ------      Message from: Irene Shipper      Created: Tue Jun 25, 2011 11:58 AM       Let pt know blood work looks fine. Repeat CBC in 3 months

## 2011-06-25 NOTE — Telephone Encounter (Signed)
Spoke with pt and he is aware. Pt wants to know if he needs an OV scheduled. Please advise.

## 2011-06-25 NOTE — Telephone Encounter (Signed)
Yes, he is due for routine office followup. Thanks

## 2011-06-26 NOTE — Telephone Encounter (Signed)
Pt scheduled to see Dr. Henrene Pastor 07/12/11@8 :30am. Pt aware of appt date and time.

## 2011-07-12 ENCOUNTER — Ambulatory Visit: Payer: Medicare Other | Admitting: Internal Medicine

## 2011-08-05 ENCOUNTER — Ambulatory Visit (INDEPENDENT_AMBULATORY_CARE_PROVIDER_SITE_OTHER): Payer: Medicare Other | Admitting: Internal Medicine

## 2011-08-05 ENCOUNTER — Encounter: Payer: Self-pay | Admitting: Internal Medicine

## 2011-08-05 DIAGNOSIS — K5190 Ulcerative colitis, unspecified, without complications: Secondary | ICD-10-CM

## 2011-08-05 DIAGNOSIS — Z86010 Personal history of colonic polyps: Secondary | ICD-10-CM

## 2011-08-05 DIAGNOSIS — K602 Anal fissure, unspecified: Secondary | ICD-10-CM

## 2011-08-05 DIAGNOSIS — Z1211 Encounter for screening for malignant neoplasm of colon: Secondary | ICD-10-CM | POA: Diagnosis not present

## 2011-08-05 MED ORDER — PEG-KCL-NACL-NASULF-NA ASC-C 100 G PO SOLR: 1.0000 | Freq: Once | ORAL | Status: DC

## 2011-08-05 NOTE — Progress Notes (Signed)
HISTORY OF PRESENT ILLNESS:  Bill Fox is a 71 y.o. male with hypertension, hyperlipidemia, remote pulmonary embolus, right-sided adenomatous colon polyps, and a left-sided ulcerative colitis. Also a history of iron deficiency anemia related to his ulcerative colitis. Last evaluated in this office May 2012. At that time his colitis was quiescent on 6 mercaptopurine and Asacol. He continues to do well on 6-mercaptopurine 150 mg daily and Asacol 1.6 g 3 times a day. No problems with abdominal pain, diarrhea, mucus, or bleeding. Occasional rectal staining with defecation. He does have a history of anal fissure. He is due for surveillance colonoscopy at this time. Does report occasional hard or firm bowel movements. Overall general health has been stable since his last visit. Regular blood work has been stable. Most recent CBC obtained 06/25/2011 was unremarkable with a hemoglobin of 14.0 and white blood cell count 5.7. His last colonoscopy was in January 2011. In addition to right-sided adenomas, left-sided colitis present. No dysplasia.   REVIEW OF SYSTEMS:  All non-GI ROS negative except for joint aches  Past Medical History  Diagnosis Date  . Ulcerative colitis   . Hyperlipidemia   . Morbid obesity   . Benign positional vertigo   . Hypertension   . Pulmonary embolism   . Coronary artery calcinosis   . Colon polyps     Past Surgical History  Procedure Date  . Nose surgery     Social History Bill Fox  reports that he quit smoking about 18 years ago. His smoking use included Cigarettes. He has a 30 pack-year smoking history. He has never used smokeless tobacco. He reports that he drinks alcohol. He reports that he does not use illicit drugs.  family history includes Cancer in his father; Diabetes in his mother and paternal uncle; and Lung cancer in his brother.  There is no history of Colon cancer.  No Known Allergies     PHYSICAL EXAMINATION: Vital signs: BP 128/84   Pulse 100  Ht 5' 11"  (1.803 m)  Wt 270 lb 9.6 oz (122.743 kg)  BMI 37.74 kg/m2  Constitutional: obese,generally well-appearing, no acute distress Psychiatric: alert and oriented x3, cooperative Eyes: extraocular movements intact, anicteric, conjunctiva pink Mouth: oral pharynx moist, no lesions Neck: supple no lymphadenopathy Cardiovascular: heart regular rate and rhythm, no murmur Lungs: clear to auscultation bilaterally Abdomen:obese,soft, nontender, nondistended, no obvious ascites, no peritoneal signs, normal bowel sounds, no organomegaly Rectal:deferred until colonoscopyExtremities: no lower extremity edema bilaterally Skin: no lesions on visible extremities Neuro: No focal deficits. No asterixis.     ASSESSMENT:  #1. Left-sided ulcerative colitis. Remains clinically quiescent on 6 mercaptopurine 150 mg daily and Asacol 1600 mg 3 times a day  #2. History of iron deficiency anemia related to ulcerative colitis. Resolved. Most recent CBC normal  #3. Anal fissure. Still present with intermittent symptoms, though significantly improved.  #4. History of right-sided adenomatous colon polyp. Due for routine surveillance colonoscopy (due to colitis) around January 2013. Will arrange at this time.   PLAN:  #1. Continue ulcerative colitis medications without change  #2. May need to prescribe diltiazem cream to be applied 5 times daily to the anus for 6 weeks for recurrent problems with fissure.  Will assess at colonoscopy  #3. Colonoscopy.The nature of the procedure, as well as the risks, benefits, and alternatives were carefully and thoroughly reviewed with the patient. Ample time for discussion and questions allowed. The patient understood, was satisfied, and agreed to proceed. Movi prep prescribed. The patient instructed  on its use #4. Routine office followup in 6 months. Continue his CBCs about every 3 months. Contact the office in the interim for any questions or problems

## 2011-08-05 NOTE — Patient Instructions (Signed)
You have been scheduled for a colonoscopy with propofol. Please follow written instructions given to you at your visit today.  Please pick up your prep kit at the pharmacy within the next 1-3 days.

## 2011-08-15 ENCOUNTER — Encounter: Payer: Medicare Other | Admitting: Internal Medicine

## 2011-09-03 ENCOUNTER — Ambulatory Visit (INDEPENDENT_AMBULATORY_CARE_PROVIDER_SITE_OTHER)
Admission: RE | Admit: 2011-09-03 | Discharge: 2011-09-03 | Disposition: A | Discharge status: Home / Self Care | Payer: Medicare Other | Source: Ambulatory Visit | Attending: Internal Medicine | Admitting: Internal Medicine

## 2011-09-03 ENCOUNTER — Ambulatory Visit (INDEPENDENT_AMBULATORY_CARE_PROVIDER_SITE_OTHER): Payer: Medicare Other | Admitting: Internal Medicine

## 2011-09-03 ENCOUNTER — Other Ambulatory Visit (INDEPENDENT_AMBULATORY_CARE_PROVIDER_SITE_OTHER): Payer: Medicare Other

## 2011-09-03 ENCOUNTER — Telehealth: Payer: Self-pay | Admitting: Internal Medicine

## 2011-09-03 ENCOUNTER — Encounter: Payer: Self-pay | Admitting: Internal Medicine

## 2011-09-03 VITALS — BP 142/72 | HR 92 | Temp 98.7°F | Ht 72.0 in | Wt 265.0 lb

## 2011-09-03 DIAGNOSIS — L821 Other seborrheic keratosis: Secondary | ICD-10-CM | POA: Diagnosis not present

## 2011-09-03 DIAGNOSIS — E785 Hyperlipidemia, unspecified: Secondary | ICD-10-CM

## 2011-09-03 DIAGNOSIS — I10 Essential (primary) hypertension: Secondary | ICD-10-CM

## 2011-09-03 DIAGNOSIS — D508 Other iron deficiency anemias: Secondary | ICD-10-CM

## 2011-09-03 DIAGNOSIS — Z23 Encounter for immunization: Secondary | ICD-10-CM | POA: Diagnosis not present

## 2011-09-03 DIAGNOSIS — J42 Unspecified chronic bronchitis: Secondary | ICD-10-CM | POA: Diagnosis not present

## 2011-09-03 DIAGNOSIS — D71 Functional disorders of polymorphonuclear neutrophils: Secondary | ICD-10-CM | POA: Diagnosis not present

## 2011-09-03 DIAGNOSIS — J8410 Pulmonary fibrosis, unspecified: Secondary | ICD-10-CM | POA: Diagnosis not present

## 2011-09-03 LAB — URINALYSIS, ROUTINE W REFLEX MICROSCOPIC
Nitrite: NEGATIVE
Specific Gravity, Urine: 1.025 (ref 1.000–1.030)
Urine Glucose: NEGATIVE
Urobilinogen, UA: 0.2 (ref 0.0–1.0)

## 2011-09-03 LAB — LIPID PANEL
LDL Cholesterol: 129 mg/dL — ABNORMAL HIGH (ref 0–99)
Total CHOL/HDL Ratio: 6
Triglycerides: 83 mg/dL (ref 0.0–149.0)

## 2011-09-03 LAB — BASIC METABOLIC PANEL
BUN: 19 mg/dL (ref 6–23)
CO2: 22 mEq/L (ref 19–32)
Chloride: 110 mEq/L (ref 96–112)
Creatinine, Ser: 1 mg/dL (ref 0.4–1.5)
Potassium: 4.6 mEq/L (ref 3.5–5.1)

## 2011-09-03 LAB — HEPATIC FUNCTION PANEL
ALT: 38 U/L (ref 0–53)
AST: 26 U/L (ref 0–37)
Bilirubin, Direct: 0.1 mg/dL (ref 0.0–0.3)
Total Protein: 7.9 g/dL (ref 6.0–8.3)

## 2011-09-03 NOTE — Assessment & Plan Note (Signed)
Lab Results  Component Value Date   LDLCALC 129* 09/03/2011    Adequate control on present rx, reviewed

## 2011-09-03 NOTE — Patient Instructions (Signed)
Please see patient coordinator before you leave today  to schedule dermatology eval     Weight control is simply a matter of calorie balance which needs to be tilted in your favor by eating less and exercising more.  To get the most out of exercise, you need to be continuously aware that you are short of breath, but never out of breath, for 30 minutes daily. As you improve, it will actually be easier for you to do the same amount of exercise  in  30 minutes so always push to the level where you are short of breath.  If this does not result in gradual weight reduction then I strongly recommend you see a nutritionist with a food diary x 2 weeks so that we can work out a negative calorie balance which is universally effective in steady weight loss programs.  Think of your calorie balance like you do your bank account where in this case you want the balance to go down so you must take in less calories than you burn up.  It's just that simple:  Hard to do, but easy to understand.  Good luck!   Please remember to go to the lab and x-ray department downstairs for your tests - we will call you with the results when they are available.      Please schedule a follow up visit in 3 months but call sooner if needed

## 2011-09-03 NOTE — Assessment & Plan Note (Signed)
All appear completely benign > pt requesting derm eval> referred to [facility]

## 2011-09-03 NOTE — Assessment & Plan Note (Signed)
Adequate control on present rx, reviewed  

## 2011-09-03 NOTE — Assessment & Plan Note (Signed)
Resolved

## 2011-09-03 NOTE — Progress Notes (Signed)
Subjective:    Patient ID: Bill Fox, male    DOB: 1940-12-15, 71 y.o.   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   Admit 2/17 > 24/2010 admit with PE ok for coumadin by GI Bill Fox), presented with sob/ syncope   July 20, 2008 first post hosp fu for PE/ Left DVT ov no cp or sob, no rectal bleeding despite coumadin.   June 13, 2009 cpx, no change in rx   Sep 26, 2009 cc coronary calcifications on ct of kidneys and rectal pain with bm's but no bleeding.   December 25, 2009--Presents for an acute office visit. Complains of burning in right thigh from knee to hip x81month progressively getting worse. Describes a burning sensation along outer/mid thigh w/ prickling sensation that comes and goes.  Dx LFC syndrome  08/15/2010  cpx no nex c/os x cataracts worse and need to be removed per ophtamologist.    11/07/2010 ov/ Bill Fox cc new onset midline  low back pain abrupt onset x  One week no obvious injury, no pain down legs, [facility] showing DJD  rx pred/ vicodin and improved.  Not using much aleve.  No problem with gait or pain with cough. 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 rec Ok to use aleve as long as you take it meals per the bottle instructions but try not to take daily   09/03/2011 f/u ov/Bill Fox cpx multiple chronic problems no new complaints, not using as much aleve and main c/os relate to burning with defecation attributed to UC > w/u in progress by PHenrene Fox  Pt denies any significant sore throat, dysphagia, itching, sneezing,  nasal  congestion or excess/ purulent secretions,  fever, chills, sweats, unintended wt loss, pleuritic or exertional cp, hempoptysis, orthopnea pnd or leg swelling.    Also denies any obvious fluctuation of symptoms with weather or environmental changes or other aggravating or alleviating factors.      Past Medical History:  ULCERATIVE COLITIS....................................................................................Marland KitchenHenrene Fox/ WDonne Fox+ COLONIC POLYPS, HX OF (ICD-V12.72)  - colonoscopy 07/24/2007  - colonoscopy 06/08/09: mucosal changes consistent with moderately severe  left-sided ulcerative colitis involving the rectum to distal transverse colon confluent  HYPERLIPIDEMIA (ICD-272.4)  - target LDL < 130 (pos HBP, male, former smoker)  MORBID OBESITY (ICD-278.01)  - ideal = 168 target 208  - Did not keep nutrition appt 07/04/09  BENIGN POSITIONAL VERTIGO, HX OF (ICD-V12.49)  HYPERTENSION (ICD-401.9)  Microsocopic Hematuria, neg w/u 1999, resent to Urology August 03, 2009 >>> negative  PULMONARY EMBOLISM 07/06/2008  - Left DVT by venous doppler 07/07/2008 (asymptomatic) > neg venous doppler 11/01/08  - nl ECHO 07/08/08  HEALTH MAINTENANCE...............................................................................Marland Kitchenert  - Pneumovax 06/20/2008  - Td 09/03/2011  - CPX  09/03/2011  coronary calcifications ? significance 08/2009               Objective:   Physical Exam   wt 265 June 28, 2008 > 260 06/13/09 >266 11/07/2010 > 261 01/29/2011 > 09/03/2011  265 Ambulatory somber wm appearing  in no acute distress.  HEENT: bottom teeth gone, upper partials in place, and orophanx clear  Neck without JVD/Nodes/TM.  Lungs clear to A and P bilaterally without cough on insp or exp maneuvers  RRR no s3 or murmur or increase in P2 No edema  Abd soft obese no tenderness.-large panniculus.  Ext warm without calf tenderness, cyanosis clubbing or edema  Neuro mood/affect flat, somber nl sensorium, nl  gait, no focal sensory, motor, cerebellar deficits noted, SLR neg. equal strength R post tib/ L DP strong Skin SK's only  .cxrr3   Assessment & Plan:

## 2011-09-03 NOTE — Assessment & Plan Note (Signed)
ideal = 168 target 208 for BMI < 30, heading in the wrong direction  Extended discussion  Weight control is simply a matter of calorie balance which needs to be tilted in your favor by eating less and exercising more.  To get the most out of exercise, you need to be continuously aware that you are short of breath, but never out of breath, for 30 minutes daily. As you improve, it will actually be easier for you to do the same amount of exercise  in  30 minutes so always push to the level where you are short of breath.  If this does not result in gradual weight reduction then I strongly recommend you see a nutritionist with a food diary x 2 weeks so that we can work out a negative calorie balance which is universally effective in steady weight loss programs.  Think of your calorie balance like you do your bank account where in this case you want the balance to go down so you must take in less calories than you burn up.  It's just that simple:  Hard to do, but easy to understand.  Good luck!

## 2011-09-04 NOTE — Progress Notes (Signed)
Quick Note:  LMTCB ______ 

## 2011-09-04 NOTE — Telephone Encounter (Signed)
LMTCB

## 2011-09-05 NOTE — Telephone Encounter (Signed)
Pt informed of lab and cxr results per Dr Melvyn Novas.

## 2011-09-09 ENCOUNTER — Telehealth: Payer: Self-pay | Admitting: Internal Medicine

## 2011-09-09 NOTE — Telephone Encounter (Signed)
Pt states that he gets his asacol from the [place] and he received a letter in the mail that it is no longer being made. Discussed with pt that it has a different name now, Delzicol. Pt states he has plenty right now and that he will discuss this with Dr. Henrene Pastor when he has his colon.

## 2011-09-12 ENCOUNTER — Encounter: Payer: Self-pay | Admitting: Internal Medicine

## 2011-09-12 ENCOUNTER — Ambulatory Visit (AMBULATORY_SURGERY_CENTER): Payer: Medicare Other | Admitting: Internal Medicine

## 2011-09-12 VITALS — BP 146/80 | HR 68 | Temp 98.5°F | Resp 20 | Ht 71.0 in | Wt 270.0 lb

## 2011-09-12 DIAGNOSIS — I10 Essential (primary) hypertension: Secondary | ICD-10-CM | POA: Diagnosis not present

## 2011-09-12 DIAGNOSIS — K5150 Left sided colitis without complications: Secondary | ICD-10-CM

## 2011-09-12 DIAGNOSIS — Z1211 Encounter for screening for malignant neoplasm of colon: Secondary | ICD-10-CM | POA: Diagnosis not present

## 2011-09-12 DIAGNOSIS — K5190 Ulcerative colitis, unspecified, without complications: Secondary | ICD-10-CM | POA: Diagnosis not present

## 2011-09-12 DIAGNOSIS — Z86010 Personal history of colonic polyps: Secondary | ICD-10-CM

## 2011-09-12 DIAGNOSIS — D126 Benign neoplasm of colon, unspecified: Secondary | ICD-10-CM

## 2011-09-12 DIAGNOSIS — K5289 Other specified noninfective gastroenteritis and colitis: Secondary | ICD-10-CM | POA: Diagnosis not present

## 2011-09-12 DIAGNOSIS — K602 Anal fissure, unspecified: Secondary | ICD-10-CM

## 2011-09-12 DIAGNOSIS — K259 Gastric ulcer, unspecified as acute or chronic, without hemorrhage or perforation: Secondary | ICD-10-CM | POA: Diagnosis not present

## 2011-09-12 MED ORDER — SODIUM CHLORIDE 0.9 % IV SOLN: 500.0000 mL | INTRAVENOUS | Status: DC

## 2011-09-12 NOTE — Patient Instructions (Signed)
Discharge instructions given with verbal understanding. Handout on polyps given. Resume previous medications. YOU HAD AN ENDOSCOPIC PROCEDURE TODAY AT THE [facility]: Refer to the procedure report that was given to you for any specific questions about what was found during the examination.  If the procedure report does not answer your questions, please call your gastroenterologist to clarify.  If you requested that your care partner not be given the details of your procedure findings, then the procedure report has been included in a sealed envelope for you to review at your convenience later.  YOU SHOULD EXPECT: Some feelings of bloating in the abdomen. Passage of more gas than usual.  Walking can help get rid of the air that was put into your GI tract during the procedure and reduce the bloating. If you had a lower endoscopy (such as a colonoscopy or flexible sigmoidoscopy) you may notice spotting of blood in your stool or on the toilet paper. If you underwent a bowel prep for your procedure, then you may not have a normal bowel movement for a few days.  DIET: Your first meal following the procedure should be a light meal and then it is ok to progress to your normal diet.  A half-sandwich or bowl of soup is an example of a good first meal.  Heavy or fried foods are harder to digest and may make you feel nauseous or bloated.  Likewise meals heavy in dairy and vegetables can cause extra gas to form and this can also increase the bloating.  Drink plenty of fluids but you should avoid alcoholic beverages for 24 hours.  ACTIVITY: Your care partner should take you home directly after the procedure.  You should plan to take it easy, moving slowly for the rest of the day.  You can resume normal activity the day after the procedure however you should NOT DRIVE or use heavy machinery for 24 hours (because of the sedation medicines used during the test).    SYMPTOMS TO REPORT IMMEDIATELY: A  gastroenterologist can be reached at any hour.  During normal business hours, 8:30 AM to 5:00 PM Monday through Friday, call [phone].  After hours and on weekends, please call the GI answering service at [phone] who will take a message and have the physician on call contact you.   Following lower endoscopy (colonoscopy or flexible sigmoidoscopy):  Excessive amounts of blood in the stool  Significant tenderness or worsening of abdominal pains  Swelling of the abdomen that is new, acute  Fever of 100F or higher  FOLLOW UP: If any biopsies were taken you will be contacted by phone or by letter within the next 1-3 weeks.  Call your gastroenterologist if you have not heard about the biopsies in 3 weeks.  Our staff will call the home number listed on your records the next business day following your procedure to check on you and address any questions or concerns that you may have at that time regarding the information given to you following your procedure. This is a courtesy call and so if there is no answer at the home number and we have not heard from you through the emergency physician on call, we will assume that you have returned to your regular daily activities without incident.  SIGNATURES/CONFIDENTIALITY: You and/or your care partner have signed paperwork which will be entered into your electronic medical record.  These signatures attest to the fact that that the information above on your After Visit Summary has   been reviewed and is understood.  Full responsibility of the confidentiality of this discharge information lies with you and/or your care-partner. 

## 2011-09-12 NOTE — Progress Notes (Signed)
Propofol administered by [place]

## 2011-09-12 NOTE — Op Note (Signed)
[facility] [address]. [place], [place]  [address]  COLONOSCOPY PROCEDURE REPORT  PATIENT:  Bill Fox, Bill Fox  MR#:  [id number] BIRTHDATE:  May 17, 1941, 70 yrs. old  GENDER:  male ENDOSCOPIST:  Docia Chuck. Geri Seminole, MD REF. BY:  Surveillance Program Recall, PROCEDURE DATE:  09/12/2011 PROCEDURE:  Colonoscopy with biopsies, Colonoscopy with snare polypectomy x 1 ASA CLASS:  Class II INDICATIONS:  history of pre-cancerous (adenomatous) colon polyps, evaluation of ulcerative colitis, surveillance and high-risk screening ; last exam 05-2009 w/ Left sided colitis, right sided adenomas MEDICATIONS:   MAC sedation, administered by CRNA, propofol (Diprivan) 200 mg IV  DESCRIPTION OF PROCEDURE:   After the risks benefits and alternatives of the procedure were thoroughly explained, informed consent was obtained.  Digital rectal exam was performed and revealed fissure.   The LB [id number] endoscope was introduced through the anus and advanced to the cecum, which was identified by both the appendix and ileocecal valve, without limitations.  The quality of the prep was excellent, using MoviPrep.  The instrument was then slowly withdrawn as the colon was fully examined. <<PROCEDUREIMAGES>>  FINDINGS:  A diminutive polyp was found in the ascending colon and snared without cautery. Retrieval was successful.  A normal appearing cecum, ileocecal valve, and appendiceal orifice were identified. The ascending, hepatic flexure and transverse colon appeared unremarkable.  There was scarring and chronic noninflammatory mucosal changes consistent with a history of left-sided ulcerative colitis. Multiple bx taken (4Q q 10cm; N = 16).   Retroflexed views in the rectum revealed no abnormalities. The time to cecum = 2:04  minutes. The scope was then withdrawn in 15:16  minutes from the cecum and the procedure completed.  COMPLICATIONS:  None ENDOSCOPIC IMPRESSION: 1) Diminutive polyp in  the ascending colon - removed 2) Normal proximal colon 3) Quiescent  left sided UC - s/p bx  RECOMMENDATIONS: 1) Await biopsy results 2) Continue current medications 3) Repeat Colonoscopy in 3 years, if no dysplasia.  ______________________________ Docia Chuck. Geri Seminole, MD  CC:  Tanda Rockers, MD; The Patient  n. eSIGNED:   Docia Chuck. Geri Seminole at 09/12/2011 03:51 PM  [name], [phone]

## 2011-09-12 NOTE — Progress Notes (Signed)
Patient did not experience any of the following events: a burn prior to discharge; a fall within the facility; wrong site/side/patient/procedure/implant event; or a hospital transfer or hospital admission upon discharge from the facility. (G8907) Patient did not have preoperative order for IV antibiotic SSI prophylaxis. (G8918)  

## 2011-09-13 ENCOUNTER — Telehealth: Payer: Self-pay | Admitting: *Deleted

## 2011-09-13 NOTE — Telephone Encounter (Signed)
  Follow up Call-  Call back number 09/12/2011  Post procedure Call Back phone  # [phone]  Permission to leave phone message Yes     Patient questions:  Do you have a fever, pain , or abdominal swelling? no Pain Score  0 *  Have you tolerated food without any problems? yes  Have you been able to return to your normal activities? yes  Do you have any questions about your discharge instructions: Diet   no Medications  no Follow up visit  no  Do you have questions or concerns about your Care? no  Actions: * If pain score is 4 or above: No action needed, pain <4.

## 2011-09-17 ENCOUNTER — Encounter: Payer: Self-pay | Admitting: Internal Medicine

## 2011-09-24 ENCOUNTER — Other Ambulatory Visit: Payer: Self-pay | Admitting: Dermatology

## 2011-09-24 DIAGNOSIS — L819 Disorder of pigmentation, unspecified: Secondary | ICD-10-CM | POA: Diagnosis not present

## 2011-09-24 DIAGNOSIS — D235 Other benign neoplasm of skin of trunk: Secondary | ICD-10-CM | POA: Diagnosis not present

## 2011-09-24 DIAGNOSIS — D1801 Hemangioma of skin and subcutaneous tissue: Secondary | ICD-10-CM | POA: Diagnosis not present

## 2011-09-24 DIAGNOSIS — D230 Other benign neoplasm of skin of lip: Secondary | ICD-10-CM | POA: Diagnosis not present

## 2011-09-24 DIAGNOSIS — D485 Neoplasm of uncertain behavior of skin: Secondary | ICD-10-CM | POA: Diagnosis not present

## 2011-12-23 ENCOUNTER — Telehealth: Payer: Self-pay | Admitting: Internal Medicine

## 2011-12-23 NOTE — Telephone Encounter (Signed)
Left message for pt to call back  °

## 2011-12-24 NOTE — Telephone Encounter (Signed)
Left message for pt to call back  °

## 2011-12-25 NOTE — Telephone Encounter (Signed)
Pt states that with his last colonoscopy he was told he was in remission. Pt is taking asacol and 6MP. Pt wants to know if he can stop taking these. States his [employer] is going to change in October and he will have to start paying a lot more. Discussed with pt that if he stopped taking the meds he would most likely start having problems again. Let pt know that Dr. Henrene Pastor was out of the office this week and that I would send the note to Dr. Henrene Pastor and call him back next week. Please advise.

## 2011-12-27 ENCOUNTER — Other Ambulatory Visit: Payer: Self-pay | Admitting: Internal Medicine

## 2011-12-30 ENCOUNTER — Other Ambulatory Visit: Payer: Self-pay

## 2011-12-30 NOTE — Telephone Encounter (Signed)
Pt aware.

## 2011-12-30 NOTE — Telephone Encounter (Signed)
Faxed refill for Delzicol to replace Asacol

## 2011-12-30 NOTE — Telephone Encounter (Signed)
Yes, he needs to continue on his medications or risks a flare of his colitis

## 2012-01-03 ENCOUNTER — Telehealth: Payer: Self-pay

## 2012-01-03 MED ORDER — MESALAMINE 400 MG PO CPDR: 1600.0000 mg | DELAYED_RELEASE_CAPSULE | Freq: Three times a day (TID) | ORAL | Status: DC

## 2012-01-03 NOTE — Telephone Encounter (Signed)
[facility] faxed an rx request for Delzicol, clarifying the correct dosage of 465m, 4 cap 3 times a day.  I sent new rx reflecting correct dosage

## 2012-01-06 ENCOUNTER — Telehealth: Payer: Self-pay | Admitting: Internal Medicine

## 2012-01-06 MED ORDER — MIRTAZAPINE 30 MG PO TABS: 30.0000 mg | ORAL_TABLET | Freq: Every day | ORAL | Status: DC

## 2012-01-06 NOTE — Telephone Encounter (Signed)
Spoke with patient-aware we don't have samples of this Rx; can send 1 time 30 day supply to local pharmacy. I called the Rx to [facility], [place] and patient is aware.

## 2012-01-29 ENCOUNTER — Telehealth: Payer: Self-pay

## 2012-01-29 MED ORDER — MERCAPTOPURINE 50 MG PO TABS: 50.0000 mg | ORAL_TABLET | Freq: Every day | ORAL | Status: DC

## 2012-01-29 NOTE — Telephone Encounter (Signed)
Refilled 6 MP

## 2012-01-31 DIAGNOSIS — H2510 Age-related nuclear cataract, unspecified eye: Secondary | ICD-10-CM | POA: Diagnosis not present

## 2012-02-03 ENCOUNTER — Telehealth: Payer: Self-pay | Admitting: Internal Medicine

## 2012-02-03 ENCOUNTER — Other Ambulatory Visit: Payer: Self-pay | Admitting: Internal Medicine

## 2012-02-03 MED ORDER — MESALAMINE 400 MG PO CPDR: 1600.0000 mg | DELAYED_RELEASE_CAPSULE | Freq: Three times a day (TID) | ORAL | Status: DC

## 2012-02-03 MED ORDER — MERCAPTOPURINE 50 MG PO TABS: 50.0000 mg | ORAL_TABLET | Freq: Every day | ORAL | Status: DC

## 2012-02-03 NOTE — Telephone Encounter (Signed)
Called number and pt was not there yet.

## 2012-02-03 NOTE — Addendum Note (Signed)
Addended by: Rosanne Sack R on: 02/03/2012 03:33 PM   Modules accepted: Orders

## 2012-02-03 NOTE — Telephone Encounter (Signed)
Pt states he cannot get asacol from his pharmacy anymore. He currently takes asacol 423m 4 tablets 3 times a day. Dosage 16014mday. Dr. PeHenrene Pastoran we send in a script for Delzicol for him? Please advise.

## 2012-02-03 NOTE — Telephone Encounter (Signed)
Yes, equivalent dose please

## 2012-02-03 NOTE — Telephone Encounter (Signed)
New rx sent to mail order pharmacy. Pt states he only has 2 36m pills left. 30day rx sent to [facility] for pt until his mail order script comes.

## 2012-03-23 ENCOUNTER — Telehealth: Payer: Self-pay | Admitting: Internal Medicine

## 2012-03-23 NOTE — Telephone Encounter (Signed)
Pt is due for CBC.

## 2012-03-23 NOTE — Telephone Encounter (Signed)
Pt aware. Pt states he forgot to come when he was supposed to for the labs. He will come in for CBC.

## 2012-03-30 ENCOUNTER — Other Ambulatory Visit: Payer: Self-pay | Admitting: Internal Medicine

## 2012-03-30 ENCOUNTER — Other Ambulatory Visit (INDEPENDENT_AMBULATORY_CARE_PROVIDER_SITE_OTHER): Payer: Medicare Other

## 2012-03-30 DIAGNOSIS — K5190 Ulcerative colitis, unspecified, without complications: Secondary | ICD-10-CM

## 2012-03-30 DIAGNOSIS — D649 Anemia, unspecified: Secondary | ICD-10-CM

## 2012-03-30 LAB — CBC WITH DIFFERENTIAL/PLATELET
Basophils Relative: 0.5 % (ref 0.0–3.0)
Eosinophils Relative: 1.7 % (ref 0.0–5.0)
HCT: 43.8 % (ref 39.0–52.0)
Hemoglobin: 14.7 g/dL (ref 13.0–17.0)
Lymphs Abs: 1.3 10*3/uL (ref 0.7–4.0)
Monocytes Relative: 12.5 % — ABNORMAL HIGH (ref 3.0–12.0)
Neutro Abs: 4.2 10*3/uL (ref 1.4–7.7)
WBC: 6.4 10*3/uL (ref 4.5–10.5)

## 2012-05-20 HISTORY — PX: CATARACT EXTRACTION W/ INTRAOCULAR LENS IMPLANT: SHX1309

## 2012-06-24 ENCOUNTER — Other Ambulatory Visit (INDEPENDENT_AMBULATORY_CARE_PROVIDER_SITE_OTHER): Payer: Medicare Other

## 2012-06-24 DIAGNOSIS — K5190 Ulcerative colitis, unspecified, without complications: Secondary | ICD-10-CM

## 2012-06-24 LAB — CBC WITH DIFFERENTIAL/PLATELET
Basophils Absolute: 0 10*3/uL (ref 0.0–0.1)
HCT: 42.8 % (ref 39.0–52.0)
Lymphs Abs: 1.4 10*3/uL (ref 0.7–4.0)
MCV: 91.1 fl (ref 78.0–100.0)
Monocytes Absolute: 0.8 10*3/uL (ref 0.1–1.0)
Platelets: 211 10*3/uL (ref 150.0–400.0)
RDW: 13.4 % (ref 11.5–14.6)

## 2012-06-25 ENCOUNTER — Other Ambulatory Visit: Payer: Self-pay | Admitting: Internal Medicine

## 2012-06-25 DIAGNOSIS — K5190 Ulcerative colitis, unspecified, without complications: Secondary | ICD-10-CM

## 2012-06-29 ENCOUNTER — Encounter: Payer: Medicare Other | Admitting: Internal Medicine

## 2012-07-20 ENCOUNTER — Other Ambulatory Visit: Payer: Self-pay | Admitting: Internal Medicine

## 2012-07-20 ENCOUNTER — Telehealth: Payer: Self-pay | Admitting: Internal Medicine

## 2012-07-20 MED ORDER — MERCAPTOPURINE 50 MG PO TABS: 50.0000 mg | ORAL_TABLET | Freq: Every day | ORAL | Status: DC

## 2012-07-20 NOTE — Telephone Encounter (Signed)
Refilled 6MP

## 2012-07-22 ENCOUNTER — Telehealth: Payer: Self-pay

## 2012-07-22 MED ORDER — MERCAPTOPURINE 50 MG PO TABS: 50.0000 mg | ORAL_TABLET | Freq: Every day | ORAL | Status: DC

## 2012-07-22 NOTE — Telephone Encounter (Signed)
Called pt to let him know everything I had done with his 6MP; patient understood and agreed to pick up his rx at [facility] until his shipment arrived; I advised pt he was overdue for an office visit so we scheduled one for 08-03-12 at 8:30am

## 2012-07-22 NOTE — Telephone Encounter (Signed)
Patient called to say that he was out of his 6MP and that [facility] did not have a new rx for it.  I told him I had sent it on 07-20-12, but he contended they told him they didn't have it.  I called [facility] who had, in fact, received the rx.  I had sent a 30 day supply but patient wanted 73, so I spoke with [facility] pharmacist and gave them the 6MP rx for a 90 day supply.  They will ship it in a few days.  Patient was concerned that he was completely out, so I sent a 30 day supply to [facility] so he could have plenty to take until his [facility] shipment arrives.

## 2012-08-02 ENCOUNTER — Other Ambulatory Visit: Payer: Self-pay | Admitting: Internal Medicine

## 2012-08-03 ENCOUNTER — Ambulatory Visit (INDEPENDENT_AMBULATORY_CARE_PROVIDER_SITE_OTHER): Payer: Medicare Other | Admitting: Internal Medicine

## 2012-08-03 ENCOUNTER — Encounter: Payer: Self-pay | Admitting: Internal Medicine

## 2012-08-03 VITALS — BP 130/90 | HR 92 | Ht 70.5 in | Wt 268.2 lb

## 2012-08-03 DIAGNOSIS — K602 Anal fissure, unspecified: Secondary | ICD-10-CM

## 2012-08-03 DIAGNOSIS — Z86010 Personal history of colonic polyps: Secondary | ICD-10-CM | POA: Diagnosis not present

## 2012-08-03 DIAGNOSIS — K5190 Ulcerative colitis, unspecified, without complications: Secondary | ICD-10-CM

## 2012-08-03 MED ORDER — MERCAPTOPURINE 50 MG PO TABS: 150.0000 mg | ORAL_TABLET | Freq: Every day | ORAL | Status: DC

## 2012-08-03 NOTE — Progress Notes (Signed)
HISTORY OF PRESENT ILLNESS:  Bill Fox is a 72 y.o. male with hypertension, hyperlipidemia, remote pulmonary embolus, chronic left-sided ulcerative colitis, right-sided adenomatous colon polyps, and obesity. She was last seen in the office 08/05/2011. At that time his colitis was quiescent on a so-called 4.8 g daily and 6-mercaptopurine 150 mg daily. He was having intermittent problems with anal fissure. He subsequently underwent surveillance colonoscopy 09/12/2011. He was found to have a diminutive descending adenoma. Quiescent left-sided colitis without dysplasia on biopsy. He was to continue on a sequela and 6-mercaptopurine without change and followup in this office around August 2013. He has not followed up until this time. It appears, for unclear reasons, that he has been on 6-mercaptopurine 50 mg daily. Appropriate dose for his body weight. He continues on mesalamine in the form of Delzicol 4.8 g daily. His GI review of systems is entirely negative. He feels well. He has been getting periodic blood work. Last CBC in February 2014 was normal.  REVIEW OF SYSTEMS:  All non-GI ROS negative except for anxiety, visual change  Past Medical History  Diagnosis Date  . Ulcerative colitis   . Morbid obesity   . Benign positional vertigo   . Hypertension   . Pulmonary embolism   . Coronary artery calcinosis   . Colon polyps   . Anxiety   . Hyperlipidemia     pt denies ever having high chol    Past Surgical History  Procedure Laterality Date  . Nose surgery      Social History Bill Fox  reports that he quit smoking about 19 years ago. His smoking use included Cigarettes. He has a 30 pack-year smoking history. He has never used smokeless tobacco. He reports that he drinks about 1.2 ounces of alcohol per week. He reports that he does not use illicit drugs.  family history includes Cancer in his father; Diabetes in his mother and paternal uncle; Lung cancer in his brother; and  Stomach cancer in his father.  There is no history of Colon cancer.  No Known Allergies     PHYSICAL EXAMINATION: Vital signs: BP 130/90  Pulse 92  Ht 5' 10.5" (1.791 m)  Wt 268 lb 4 oz (121.677 kg)  BMI 37.93 kg/m2 General: Well-developed, well-nourished, no acute distress HEENT: Sclerae are anicteric, conjunctiva pink. Oral mucosa intact Lungs: Clear Heart: Regular Abdomen: soft, nontender, nondistended, no obvious ascites, no peritoneal signs, normal bowel sounds. No organomegaly. Extremities: No edema Psychiatric: alert and oriented x3. Cooperative   ASSESSMENT:  #1. Chronic left-sided ulcerative colitis. Clinically quiescent. Currently on mesalamine and subtherapeutic 6-mercaptopurine #2. History of sporadic right-sided adenomas, small #3. History of iron deficiency anemia secondary to ulcerative colitis #4. History of anal fissure. Resolved #5. General medical problems. Managed by Dr. Melvyn Novas  PLAN:  #1. Continue mesalamine 4.8 g daily #2. Continue 6-mercaptopurine, but returned to therapeutic dose of 150 mg daily as previous. #3. Continue with periodic CBCs. Next CBC in 2 months #4. Routine office followup in 1 year. Sooner if needed #5. Surveillance colonoscopy around April 2016

## 2012-08-03 NOTE — Patient Instructions (Addendum)
We have sent the following medications to [facility]:  Mercaptapurine  Start taking 3 tablets, of your current supply, once a day 1 hour before or 2 hours after a meal   Please come to the lab in 2 months for a CBC.  Please follow up with Dr. Henrene Pastor in 1 year.

## 2012-08-21 DIAGNOSIS — IMO0002 Reserved for concepts with insufficient information to code with codable children: Secondary | ICD-10-CM | POA: Diagnosis not present

## 2012-08-26 DIAGNOSIS — IMO0002 Reserved for concepts with insufficient information to code with codable children: Secondary | ICD-10-CM | POA: Diagnosis not present

## 2012-08-26 DIAGNOSIS — H2589 Other age-related cataract: Secondary | ICD-10-CM | POA: Diagnosis not present

## 2012-08-26 DIAGNOSIS — H268 Other specified cataract: Secondary | ICD-10-CM | POA: Diagnosis not present

## 2012-09-03 ENCOUNTER — Encounter: Payer: Medicare Other | Admitting: Internal Medicine

## 2012-09-10 ENCOUNTER — Ambulatory Visit (INDEPENDENT_AMBULATORY_CARE_PROVIDER_SITE_OTHER): Payer: Medicare Other | Admitting: Internal Medicine

## 2012-09-10 ENCOUNTER — Encounter: Payer: Self-pay | Admitting: Internal Medicine

## 2012-09-10 ENCOUNTER — Ambulatory Visit (INDEPENDENT_AMBULATORY_CARE_PROVIDER_SITE_OTHER)
Admission: RE | Admit: 2012-09-10 | Discharge: 2012-09-10 | Disposition: A | Discharge status: Home / Self Care | Payer: Medicare Other | Source: Ambulatory Visit | Attending: Internal Medicine | Admitting: Internal Medicine

## 2012-09-10 ENCOUNTER — Other Ambulatory Visit (INDEPENDENT_AMBULATORY_CARE_PROVIDER_SITE_OTHER): Payer: Medicare Other

## 2012-09-10 VITALS — BP 144/78 | HR 94 | Temp 97.8°F | Ht 70.0 in | Wt 271.0 lb

## 2012-09-10 DIAGNOSIS — H53419 Scotoma involving central area, unspecified eye: Secondary | ICD-10-CM | POA: Insufficient documentation

## 2012-09-10 DIAGNOSIS — E785 Hyperlipidemia, unspecified: Secondary | ICD-10-CM

## 2012-09-10 DIAGNOSIS — H53459 Other localized visual field defect, unspecified eye: Secondary | ICD-10-CM

## 2012-09-10 DIAGNOSIS — H53413 Scotoma involving central area, bilateral: Secondary | ICD-10-CM

## 2012-09-10 DIAGNOSIS — L821 Other seborrheic keratosis: Secondary | ICD-10-CM

## 2012-09-10 DIAGNOSIS — I10 Essential (primary) hypertension: Secondary | ICD-10-CM | POA: Diagnosis not present

## 2012-09-10 DIAGNOSIS — D508 Other iron deficiency anemias: Secondary | ICD-10-CM | POA: Diagnosis not present

## 2012-09-10 DIAGNOSIS — Z01818 Encounter for other preprocedural examination: Secondary | ICD-10-CM | POA: Diagnosis not present

## 2012-09-10 DIAGNOSIS — J984 Other disorders of lung: Secondary | ICD-10-CM | POA: Diagnosis not present

## 2012-09-10 LAB — CBC WITH DIFFERENTIAL/PLATELET
Eosinophils Absolute: 0.2 10*3/uL (ref 0.0–0.7)
Eosinophils Relative: 2.7 % (ref 0.0–5.0)
MCHC: 34.1 g/dL (ref 30.0–36.0)
MCV: 91.2 fl (ref 78.0–100.0)
Monocytes Absolute: 0.8 10*3/uL (ref 0.1–1.0)
Neutrophils Relative %: 61.2 % (ref 43.0–77.0)
Platelets: 189 10*3/uL (ref 150.0–400.0)
WBC: 5.9 10*3/uL (ref 4.5–10.5)

## 2012-09-10 LAB — HEPATIC FUNCTION PANEL
Bilirubin, Direct: 0.1 mg/dL (ref 0.0–0.3)
Total Bilirubin: 0.3 mg/dL (ref 0.3–1.2)

## 2012-09-10 LAB — LIPID PANEL
Cholesterol: 181 mg/dL (ref 0–200)
HDL: 27.9 mg/dL — ABNORMAL LOW (ref >39.00)
LDL Cholesterol: 134 mg/dL — ABNORMAL HIGH (ref 0–99)
Triglycerides: 98 mg/dL (ref 0.0–149.0)
VLDL: 19.6 mg/dL (ref 0.0–40.0)

## 2012-09-10 LAB — BASIC METABOLIC PANEL
BUN: 14 mg/dL (ref 6–23)
Creatinine, Ser: 1 mg/dL (ref 0.4–1.5)
GFR: 83.43 mL/min (ref >60.00)

## 2012-09-10 NOTE — Assessment & Plan Note (Signed)
Adequate control on present rx, reviewed  

## 2012-09-10 NOTE — Assessment & Plan Note (Signed)
  Recent Labs Lab 09/10/12 0930  HGB 15.1   Despite continued occult gib no evidence of sign fe def anemia, f/u Henrene Pastor planned

## 2012-09-10 NOTE — Patient Instructions (Addendum)
Please remember to go to the lab and x-ray department downstairs for your tests - we will call you with the results when they are available.     Weight control is simply a matter of calorie balance which needs to be tilted in your favor by eating less and exercising more.  To get the most out of exercise, you need to be continuously aware that you are short of breath, but never out of breath, for 30 minutes daily. As you improve, it will actually be easier for you to do the same amount of exercise  in  30 minutes so always push to the level where you are short of breath.  If this does not result in gradual weight reduction then I strongly recommend you see a nutritionist with a food diary x 2 weeks so that we can work out a negative calorie balance which is universally effective in steady weight loss programs.  Think of your calorie balance like you do your bank account where in this case you want the balance to go down so you must take in less calories than you burn up.  It's just that simple:  Hard to do, but easy to understand.  Good luck!   Please schedule a follow up visit in 3 months but call sooner if needed

## 2012-09-10 NOTE — Assessment & Plan Note (Signed)
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

## 2012-09-10 NOTE — Assessment & Plan Note (Signed)
All very benign appearing

## 2012-09-10 NOTE — Assessment & Plan Note (Signed)
Nothing to sugest this is tia, robably atypical migraine. Given occult GIB tendency no option for more aggressive anticoagulation

## 2012-09-10 NOTE — Progress Notes (Signed)
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   Admit 07/06/08 > 24/2010 admit with PE ok for coumadin by GI Henrene Pastor), presented with sob/ syncope   July 20, 2008 first post hosp fu for PE/ Left DVT ov no cp or sob, no rectal bleeding despite coumadin.   June 13, 2009 cpx, no change in rx   Sep 26, 2009 cc coronary calcifications on ct of kidneys and rectal pain with bm's but no bleeding.   December 25, 2009--Presents for an acute office visit. Complains of burning in right thigh from knee to hip x59month progressively getting worse. Describes a burning sensation along outer/mid thigh w/ prickling sensation that comes and goes.  Dx LFC syndrome  08/15/2010  cpx no nex c/os x cataracts worse and need to be removed per ophtamologist.    11/07/2010 ov/ Shaarav Ripple cc new onset midline  low back pain abrupt onset x  One week no obvious injury, no pain down legs, [facility] showing DJD  rx pred/ vicodin and improved.  Not using much aleve.  No problem with gait or pain with cough. 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 rec Ok to use aleve as long as you take it meals per the bottle instructions but try not to take daily   09/03/2011 f/u ov/Shermeka Rutt cpx multiple chronic problems no new complaints, not using as much aleve and main c/os relate to burning with defecation attributed to UC > w/u in progress by PHenrene Pastorrec See derm/lose wt   09/10/2012 f/u ov/Malekai Markwood comprehensive yearly  eval for hbp Chief Complaint  Patient presents with  . Annual Exam    c/o "seeing jagged black lines" a couple times of year. Opthomologist staes that this could have meant a stroke.    only last 15-20 min no assoc ha, only happened two or three times for one year, no other neuro or viz changes, no amaurosis fugax, no claudicaiton sob or cp  Sleeping ok without nocturnal  or early am exacerbation  of respiratory  c/o's or need for noct saba. Also denies any obvious fluctuation of symptoms with weather or environmental changes or other aggravating or alleviating factors except as outlined above   Current Medications, Allergies, Past Medical History, Past Surgical History, Family History, and Social History were reviewed in [employer].  ROS  The following are not active complaints unless bolded sore throat, dysphagia, dental problems, itching, sneezing,  nasal congestion or excess/ purulent secretions, ear ache,   fever, chills, sweats, unintended wt loss, pleuritic or exertional cp, hemoptysis,  orthopnea pnd or leg swelling, presyncope, palpitations, heartburn, abdominal pain, anorexia, nausea, vomiting, diarrhea  or change in bowel or urinary habits, change in stools or urine, dysuria,hematuria,  rash, arthralgias, visual complaints, headache, numbness weakness or ataxia or problems with walking or coordination,  change in mood/affect or memory.        Past  Medical History:  ULCERATIVE COLITIS...................................................................................Marland KitchenHenrene Pastor / Donne Hazel + COLONIC POLYPS, HX OF (ICD-V12.72)  - colonoscopy 07/24/2007  - colonoscopy 06/08/09: mucosal changes consistent with moderately severe  left-sided ulcerative colitis involving the rectum to distal transverse colon confluent  HYPERLIPIDEMIA (ICD-272.4)  - target LDL < 130 (pos HBP, male, former smoker)  MORBID OBESITY (ICD-278.01)  - ideal = 168 target 208  - Did not  keep nutrition appt 07/04/09  BENIGN POSITIONAL VERTIGO, HX OF (ICD-V12.49)  HYPERTENSION (ICD-401.9)  Microsocopic Hematuria, neg w/u 1999, resent to Urology August 03, 2009 >>> negative  PULMONARY EMBOLISM 07/06/2008  - Left DVT by venous doppler 07/07/2008 (asymptomatic) > neg venous doppler 11/01/08  - nl ECHO 07/08/08  HEALTH MAINTENANCE..............................................................................Marland KitchenWert  - Pneumovax 06/20/2008  - Td 09/03/2011  - CPX  09/10/2012  coronary calcifications ? significance 08/2009               Objective:   Physical Exam   wt 265 June 28, 2008 > 260 06/13/09 >266 11/07/2010 > 261 01/29/2011 > 09/03/2011  265 > 271 09/10/2012  Ambulatory somber wm appearing in no acute distress.  HEENT: bottom teeth gone, upper partials in place, and orophanx clear  L ear wax impacted Neck without JVD/Nodes/TM.  Lungs clear to A and P bilaterally without cough on insp or exp maneuvers  RRR no s3 or murmur or increase in P2 No edema  Abd soft obese no tenderness.-large panniculus.  Ext warm without calf tenderness, cyanosis clubbing or edema  Neuro mood/affect flat, somber nl sensorium, nl gait, no focal sensory, motor, cerebellar deficits noted, SLR neg. equal strength R post tib/ L DP stronger than same side pusles Skin SK's only GU testes down bilateral, neg IH Mild bph, stool Trace G POS  CXR  09/10/2012 : There is scarring in the lung bases with interstitial thickening, stable. There is chronic central peribronchial thickening. Evidence of prior granulomatous disease. No edema or consolidation.       Assessment & Plan:

## 2012-09-11 NOTE — Progress Notes (Signed)
Quick Note:  ATC patient, no answer [id number] ______ 

## 2012-09-14 ENCOUNTER — Telehealth: Payer: Self-pay | Admitting: Internal Medicine

## 2012-09-14 NOTE — Progress Notes (Signed)
Quick Note:  Spoke with pt and notified of results per Dr. [name]. Pt verbalized understanding and denied any questions.  ______ 

## 2012-09-14 NOTE — Telephone Encounter (Signed)
Spoke with pt and notified of results per Dr. [name]. Pt verbalized understanding and denied any questions. 

## 2012-09-23 ENCOUNTER — Other Ambulatory Visit: Payer: Self-pay | Admitting: Internal Medicine

## 2012-09-23 ENCOUNTER — Telehealth: Payer: Self-pay

## 2012-09-23 DIAGNOSIS — K5190 Ulcerative colitis, unspecified, without complications: Secondary | ICD-10-CM

## 2012-09-23 NOTE — Telephone Encounter (Signed)
Order entered in epic for CBC in August.

## 2012-09-23 NOTE — Telephone Encounter (Signed)
Message copied by Algernon Huxley on Wed Sep 23, 2012 11:42 AM ------      Message from: Irene Shipper      Created: Wed Sep 23, 2012 11:23 AM      Regarding: RE: CBC       No. His CBC looked good. Repeat CBC in 3 months.      ----- Message -----         From: Maury Dus, RN         Sent: 09/23/2012   9:37 AM           To: Irene Shipper, MD      Subject: FW: CBC                                                  Dr. Henrene Pastor,            This pt was supposed to come back for a CBC in May. Pt had a CBC in May for Dr. Melvyn Novas 09/10/12. Do you want him to come in for another lab?            Thanks,      Vaughan Basta             ----- Message -----         From: Maury Dus, RN         Sent: 09/21/2012           To: Maury Dus, RN      Subject: CBC                                                      Needs repeat CBC in May             ------

## 2012-10-06 DIAGNOSIS — H35359 Cystoid macular degeneration, unspecified eye: Secondary | ICD-10-CM | POA: Diagnosis not present

## 2012-10-23 DIAGNOSIS — H35359 Cystoid macular degeneration, unspecified eye: Secondary | ICD-10-CM | POA: Diagnosis not present

## 2012-11-08 ENCOUNTER — Other Ambulatory Visit: Payer: Self-pay | Admitting: Internal Medicine

## 2012-12-08 ENCOUNTER — Ambulatory Visit: Payer: Medicare Other | Admitting: Internal Medicine

## 2012-12-17 ENCOUNTER — Ambulatory Visit (INDEPENDENT_AMBULATORY_CARE_PROVIDER_SITE_OTHER): Payer: Medicare Other | Admitting: Internal Medicine

## 2012-12-17 ENCOUNTER — Encounter: Payer: Self-pay | Admitting: Internal Medicine

## 2012-12-17 VITALS — BP 140/78 | HR 80 | Temp 96.5°F | Ht 70.0 in | Wt 264.0 lb

## 2012-12-17 DIAGNOSIS — D508 Other iron deficiency anemias: Secondary | ICD-10-CM | POA: Diagnosis not present

## 2012-12-17 DIAGNOSIS — I10 Essential (primary) hypertension: Secondary | ICD-10-CM

## 2012-12-17 DIAGNOSIS — E785 Hyperlipidemia, unspecified: Secondary | ICD-10-CM | POA: Diagnosis not present

## 2012-12-17 NOTE — Patient Instructions (Addendum)
Weight control is simply a matter of calorie balance which needs to be tilted in your favor by eating less and exercising more.  To get the most out of exercise, you need to be continuously aware that you are short of breath, but never out of breath, for 30 minutes daily. As you improve, it will actually be easier for you to do the same amount of exercise  in  30 minutes so always push to the level where you are short of breath within 5 min of starting.  If this does not result in gradual weight reduction then I strongly recommend you see a nutritionist with a food diary x 2 weeks so that we can work out a negative calorie balance which is universally effective in steady weight loss programs.  Think of your calorie balance like you do your bank account where in this case you want the balance to go down so you must take in less calories than you burn up.  It's just that simple:  Hard to do, but easy to understand.  Good luck!   Work on wax in L ear with over the counter kit then come see Tammy NP to have the ear looked at again to be sure it's cleared  See me after 09/10/13 for comprehensive eval but call sooner if needed

## 2012-12-17 NOTE — Progress Notes (Signed)
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   Admit 07/06/08 > 24/2010 admit with PE ok for coumadin by GI Bill Fox), presented with sob/ syncope   July 20, 2008 first post hosp fu for PE/ Left DVT ov no cp or sob, no rectal bleeding despite coumadin.   June 13, 2009 cpx, no change in rx   Sep 26, 2009 cc coronary calcifications on ct of kidneys and rectal pain with bm's but no bleeding.   December 25, 2009--Presents for an acute office visit. Complains of burning in right thigh from knee to hip x40month progressively getting worse. Describes a burning sensation along outer/mid thigh w/ prickling sensation that comes and goes.  Dx LFC syndrome  08/15/2010  cpx no nex c/os x cataracts worse and need to be removed per ophtamologist.    11/07/2010 ov/ Bill Fox cc new onset midline  low back pain abrupt onset x  One week no obvious injury, no pain down legs, [facility] showing DJD  rx pred/ vicodin and improved.  Not using much aleve.  No problem with gait or pain with cough. 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 rec Ok to use aleve as long as you take it meals per the bottle instructions but try not to take daily   09/03/2011 f/u ov/Bill Fox cpx multiple chronic problems no new complaints, not using as much aleve and main c/os relate to burning with defecation attributed to UC > w/u in progress by PHenrene Pastorrec See derm/lose wt   09/10/2012 f/u ov/Bill Fox comprehensive yearly  eval for hbp Chief Complaint  Patient presents with  . Annual Exam    c/o "seeing jagged black lines" a couple times of year. Opthomologist staes that this could have meant a stroke.    only last 15-20 min no assoc ha, only happened two or three times for one year, no other neuro or viz changes, no amaurosis fugax, no claudicaiton sob or cp rec No change rx   12/17/2012 f/u ov/Bill Fox re hbp Chief Complaint  Patient presents with  . Follow-up    Pt states doing well and denies any co's today.    no further viz symptoms, doing gxt daily x 30 min but concerned not losing wt. Not sob with ex but "works up a good sweat" admits not really watching diet   Sleeping ok without nocturnal  or early am exacerbation  of respiratory  c/o's or need for noct saba. Also denies any obvious fluctuation of symptoms with weather or environmental changes or other aggravating or alleviating factors except as outlined above   Current Medications, Allergies, Past Medical History, Past Surgical History, Family History, and Social History were reviewed in [employer].  ROS  The following are not active complaints unless bolded sore throat, dysphagia, dental problems, itching, sneezing,  nasal congestion or excess/ purulent secretions, ear ache,   fever, chills, sweats, unintended  wt loss, pleuritic or exertional cp, hemoptysis,  orthopnea pnd or leg swelling, presyncope, palpitations, heartburn, abdominal pain, anorexia, nausea, vomiting, diarrhea  or change in bowel or urinary habits, change in stools or urine, dysuria,hematuria,  rash, arthralgias, visual complaints, headache, numbness weakness or ataxia or problems with walking or coordination,  change in mood/affect or memory.        Past Medical History:  ULCERATIVE COLITIS...................................................................................Marland KitchenHenrene Fox / Bill Fox + COLONIC POLYPS, HX OF (ICD-V12.72)  - colonoscopy  07/24/2007  - colonoscopy 06/08/09: mucosal changes consistent with moderately severe  left-sided ulcerative colitis involving the rectum to distal transverse colon confluent  HYPERLIPIDEMIA (ICD-272.4)  - target LDL < 130 (pos HBP, male, former smoker)  MORBID OBESITY (ICD-278.01)  - ideal = 168 target 208  - Did not keep nutrition appt 07/04/09  BENIGN POSITIONAL VERTIGO, HX OF (ICD-V12.49)  HYPERTENSION (ICD-401.9)  Microsocopic Hematuria, neg w/u 1999, resent to Urology August 03, 2009 >>> negative  PULMONARY EMBOLISM 07/06/2008  - Left DVT by venous doppler 07/07/2008 (asymptomatic) > neg venous doppler 11/01/08  - nl ECHO 07/08/08  HEALTH MAINTENANCE..............................................................................Marland KitchenWert  - Pneumovax 06/20/2008  - Td 09/03/2011  - CPX  09/10/2012  coronary calcifications ? significance 08/2009               Objective:   Physical Exam   wt 265 June 28, 2008 > 260 06/13/09 >266 11/07/2010 > 261 01/29/2011 > 09/03/2011  265 > 271 09/10/2012 > 12/17/2012  264  Ambulatory somber wm appearing in no acute distress.  HEENT: bottom teeth gone, upper partials in place, and orophanx clear  L ear wax impacted Neck without JVD/Nodes/TM.  Lungs clear to A and P bilaterally without cough on insp or exp maneuvers  RRR no s3 or murmur or increase in P2 No edema  Abd soft obese no tenderness.-large panniculus.  Ext warm without calf tenderness, cyanosis clubbing or edema  Neuro mood/affect flat, somber nl sensorium, nl gait, no focal sensory, motor, cerebellar deficits noted, SLR neg. equal strength R post tib/ L DP stronger than same side pusles Skin SK's only GU testes down bilateral, neg IH    CXR  09/10/2012 : There is scarring in the lung bases with interstitial thickening, stable. There is chronic central peribronchial thickening. Evidence of prior granulomatous disease. No edema or consolidation.       Assessment & Plan:

## 2012-12-18 NOTE — Assessment & Plan Note (Signed)
ideal = 168 target 208 for BMI < 30  Reviewed concept of neg cal balance again to help him understand it's not the duration of ex but the intensity he's lacking  See instructions for specific recommendations which were reviewed directly with the patient who was given a copy with highlighter outlining the key components.

## 2012-12-18 NOTE — Assessment & Plan Note (Signed)
Adequate control on present rx, reviewed rx > no changes needed

## 2012-12-18 NOTE — Assessment & Plan Note (Addendum)
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

## 2012-12-25 ENCOUNTER — Other Ambulatory Visit (INDEPENDENT_AMBULATORY_CARE_PROVIDER_SITE_OTHER): Payer: Medicare Other

## 2012-12-25 DIAGNOSIS — K5190 Ulcerative colitis, unspecified, without complications: Secondary | ICD-10-CM | POA: Diagnosis not present

## 2012-12-25 LAB — CBC WITH DIFFERENTIAL/PLATELET
Basophils Absolute: 0 10*3/uL (ref 0.0–0.1)
HCT: 45.4 % (ref 39.0–52.0)
Lymphocytes Relative: 27.1 % (ref 12.0–46.0)
Lymphs Abs: 1.6 10*3/uL (ref 0.7–4.0)
Monocytes Relative: 13.5 % — ABNORMAL HIGH (ref 3.0–12.0)
Neutrophils Relative %: 55.9 % (ref 43.0–77.0)
Platelets: 181 10*3/uL (ref 150.0–400.0)
RDW: 15.4 % — ABNORMAL HIGH (ref 11.5–14.6)

## 2012-12-28 ENCOUNTER — Other Ambulatory Visit: Payer: Self-pay | Admitting: Internal Medicine

## 2012-12-28 DIAGNOSIS — K51218 Ulcerative (chronic) proctitis with other complication: Secondary | ICD-10-CM

## 2013-01-08 ENCOUNTER — Other Ambulatory Visit: Payer: Self-pay | Admitting: Internal Medicine

## 2013-01-12 DIAGNOSIS — H0019 Chalazion unspecified eye, unspecified eyelid: Secondary | ICD-10-CM | POA: Diagnosis not present

## 2013-01-16 ENCOUNTER — Other Ambulatory Visit: Payer: Self-pay | Admitting: Internal Medicine

## 2013-03-23 ENCOUNTER — Other Ambulatory Visit (INDEPENDENT_AMBULATORY_CARE_PROVIDER_SITE_OTHER): Payer: Medicare Other

## 2013-03-23 DIAGNOSIS — K51218 Ulcerative (chronic) proctitis with other complication: Secondary | ICD-10-CM

## 2013-03-23 DIAGNOSIS — K5120 Ulcerative (chronic) proctitis without complications: Secondary | ICD-10-CM | POA: Diagnosis not present

## 2013-03-23 LAB — CBC WITH DIFFERENTIAL/PLATELET
Eosinophils Relative: 2.8 % (ref 0.0–5.0)
HCT: 43 % (ref 39.0–52.0)
Lymphs Abs: 1.5 10*3/uL (ref 0.7–4.0)
Monocytes Relative: 11 % (ref 3.0–12.0)
Platelets: 276 10*3/uL (ref 150.0–400.0)
RBC: 4.57 Mil/uL (ref 4.22–5.81)
WBC: 6.5 10*3/uL (ref 4.5–10.5)

## 2013-03-24 ENCOUNTER — Other Ambulatory Visit: Payer: Self-pay

## 2013-03-24 DIAGNOSIS — K5190 Ulcerative colitis, unspecified, without complications: Secondary | ICD-10-CM

## 2013-03-29 ENCOUNTER — Telehealth: Payer: Self-pay | Admitting: Internal Medicine

## 2013-03-29 NOTE — Telephone Encounter (Signed)
Pt states he started having problems with bowel urgency and burning on Thursday of last week. States he missed 2 days of work. Pt scheduled to see Dr. Henrene Pastor tomorrow at 9:30am. Pt aware of appt.

## 2013-03-30 ENCOUNTER — Encounter: Payer: Self-pay | Admitting: Internal Medicine

## 2013-03-30 ENCOUNTER — Ambulatory Visit (INDEPENDENT_AMBULATORY_CARE_PROVIDER_SITE_OTHER): Payer: Medicare Other | Admitting: Internal Medicine

## 2013-03-30 VITALS — BP 146/80 | HR 80 | Ht 70.0 in | Wt 265.0 lb

## 2013-03-30 DIAGNOSIS — K5190 Ulcerative colitis, unspecified, without complications: Secondary | ICD-10-CM

## 2013-03-30 DIAGNOSIS — R194 Change in bowel habit: Secondary | ICD-10-CM

## 2013-03-30 DIAGNOSIS — Z86010 Personal history of colonic polyps: Secondary | ICD-10-CM

## 2013-03-30 DIAGNOSIS — R198 Other specified symptoms and signs involving the digestive system and abdomen: Secondary | ICD-10-CM | POA: Diagnosis not present

## 2013-03-30 DIAGNOSIS — K602 Anal fissure, unspecified: Secondary | ICD-10-CM | POA: Diagnosis not present

## 2013-03-30 NOTE — Patient Instructions (Signed)
Please follow up with Dr. Henrene Pastor in one year

## 2013-03-30 NOTE — Progress Notes (Signed)
HISTORY OF PRESENT ILLNESS:  Bill Fox is a 72 y.o. male with hypertension, hyperlipidemia, remote pulmonary embolus, chronic left-sided ulcerative colitis, right-sided adenomatous colon polyp, and obesity. He was last seen in the office 08/03/2012 regarding management of his chronic ulcerative colitis. Asymptomatic at that time. He has continued on mesalamine 4.8 g daily and 6-mercaptopurine 150 mg daily. Regular complete blood counts have been normal. Patient was in his usual state of health until 5 days ago when he awoke with difficulty to urgency. Normal bowel movements with perirectal burning. That day, he moved his bowels every 20-30 minutes. Describes stools as soft. No blood or mucus. No fever or abdominal pain. Over the next few days his problems dissipated. One normal bowel movement this morning with minimal burning discomfort. He does have a prior history of anal fissure. No additional GI complaints  REVIEW OF SYSTEMS:  All non-GI ROS negative except for back pain  Past Medical History  Diagnosis Date  . Ulcerative colitis   . Morbid obesity   . Benign positional vertigo   . Hypertension   . Pulmonary embolism   . Coronary artery calcinosis   . Colon polyps   . Anxiety   . Hyperlipidemia     pt denies ever having high chol    Past Surgical History  Procedure Laterality Date  . Nose surgery    . Cataract extraction Left 08/26/12    Social History Bill Fox  reports that he quit smoking about 19 years ago. His smoking use included Cigarettes. He has a 30 pack-year smoking history. He has never used smokeless tobacco. He reports that he drinks about 1.2 ounces of alcohol per week. He reports that he does not use illicit drugs.  family history includes Cancer in his father; Diabetes in his mother and paternal uncle; Lung cancer in his brother; Stomach cancer in his father. There is no history of Colon cancer.  No Known Allergies     PHYSICAL EXAMINATION: Vital  signs: BP 146/80  Pulse 80  Ht 5' 10"  (1.778 m)  Wt 265 lb (120.203 kg)  BMI 38.02 kg/m2 General: Well-developed, well-nourished, no acute distress HEENT: Sclerae are anicteric, conjunctiva pink. Oral mucosa intact Lungs: Clear Heart: Regular Abdomen: soft, obese, nontender, nondistended, no obvious ascites, no peritoneal signs, normal bowel sounds. No organomegaly. Extremities: No edema Psychiatric: alert and oriented x3. Cooperative    ASSESSMENT:  #1. Transient change in bowel habits as described. I do not think this represents a colitis flare. Associated minor rectal burning likely aggravation of prior fissure. #2. History of left of ulcerative colitis #3. History of sporadic adenoma. Last colonoscopy April 2013. #4. General medical problems   PLAN:  #1. Expectant management #2. Continue medications for ulcerative colitis. Continue periodic CBCs as directed #3. Routine office followup in 1 year. Contact the office in the interim for questions or problems #4. Surveillance colonoscopy around April 2016.

## 2013-04-08 ENCOUNTER — Ambulatory Visit (INDEPENDENT_AMBULATORY_CARE_PROVIDER_SITE_OTHER): Payer: Medicare Other | Admitting: Internal Medicine

## 2013-04-08 ENCOUNTER — Other Ambulatory Visit (INDEPENDENT_AMBULATORY_CARE_PROVIDER_SITE_OTHER): Payer: Medicare Other

## 2013-04-08 ENCOUNTER — Encounter: Payer: Self-pay | Admitting: Internal Medicine

## 2013-04-08 VITALS — BP 142/70 | HR 73 | Temp 98.0°F | Ht 71.5 in | Wt 267.0 lb

## 2013-04-08 DIAGNOSIS — M549 Dorsalgia, unspecified: Secondary | ICD-10-CM

## 2013-04-08 LAB — URINALYSIS
Bilirubin Urine: NEGATIVE
Ketones, ur: NEGATIVE
Leukocytes, UA: NEGATIVE
Nitrite: NEGATIVE
Specific Gravity, Urine: 1.025 (ref 1.000–1.030)
Total Protein, Urine: NEGATIVE
Urine Glucose: NEGATIVE
Urobilinogen, UA: 0.2 (ref 0.0–1.0)
pH: 6 (ref 5.0–8.0)

## 2013-04-08 MED ORDER — HYDROCODONE-ACETAMINOPHEN 5-300 MG PO TABS: ORAL_TABLET | ORAL | Status: DC

## 2013-04-08 NOTE — Progress Notes (Signed)
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     History of Present Illness  Admit 07/06/08 > 24/2010 admit with PE ok for coumadin by GI Henrene Pastor), presented with sob/ syncope   July 20, 2008 first post hosp fu for PE/ Left DVT ov no cp or sob, no rectal bleeding despite coumadin.   June 13, 2009 cpx, no change in rx   Sep 26, 2009 cc coronary calcifications on ct of kidneys and rectal pain with bm's but no bleeding.   December 25, 2009--Presents for an acute office visit. Complains of burning in right thigh from knee to hip x40month progressively getting worse. Describes a burning sensation along outer/mid thigh w/ prickling sensation that comes and goes.  Dx LFC syndrome  08/15/2010  cpx no nex c/os x cataracts worse and need to be removed per ophtamologist.    11/07/2010 ov/ Jere Vanburen cc new onset midline  low back pain abrupt onset x  One week no obvious injury, no pain down legs, [facility]  rx pred/ vicodin and improved.  Not using much aleve.  No problem with gait or pain with cough. 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 rec Ok to use aleve as long as you take it meals per the bottle instructions but try not to take daily   09/03/2011 f/u ov/Sandara Tyree cpx multiple chronic problems no new complaints, not using as much aleve and main c/os relate to burning with defecation attributed to UC > w/u in progress by PHenrene Pastorrec See derm/lose wt   09/10/2012 f/u  ov/Ronald Vinsant comprehensive yearly eval for hbp Chief Complaint  Patient presents with  . Annual Exam    c/o "seeing jagged black lines" a couple times of year. Opthomologist staes that this could have meant a stroke.    only last 15-20 min no assoc ha, only happened two or three times for one year, no other neuro or viz changes, no amaurosis fugax, no claudicaiton sob or cp rec No change rx   12/17/2012 f/u ov/Dahl Higinbotham re hbp Chief Complaint  Patient presents with  . Follow-up    Pt states doing well and denies any co's today.    no further viz symptoms, doing gxt daily x 30 min but concerned not losing wt. Not sob with ex but "works up a good sweat" admits not really watching diet rec Weight control is simply a matter of calorie balance which needs to be tilted in your favor by eating less and exercising more.    Work on wax in L ear with over the counter kit then come see Tammy NP to have the ear looked at again to be sure it's cleared  04/08/2013 acute  ov/Raynell Upton re: new back pain Chief Complaint  Patient presents with  . Acute Visit    Pt c/o low back pain x 1 wk- no known injury.   has tried a few aleve no  better No radiation, mostly L paralumbar or leg weakness, numbness Pain better sitting, lying down, worse standing No change urination   Sleeping ok without nocturnal  or early am exacerbation  of respiratory  c/o's or need for noct saba. Also denies any obvious fluctuation of symptoms with weather or environmental changes or other aggravating or alleviating factors except as outlined above   Current Medications, Allergies, Past Medical History, Past Surgical History, Family History, and Social History were reviewed in [employer] record.  ROS  The following are not active complaints unless bolded sore throat, dysphagia, dental problems, itching, sneezing,  nasal congestion or excess/ purulent secretions, ear ache,   fever, chills, sweats, unintended wt loss,  pleuritic or exertional cp, hemoptysis,  orthopnea pnd or leg swelling, presyncope, palpitations, heartburn, abdominal pain, anorexia, nausea, vomiting, diarrhea  or change in bowel or urinary habits, change in stools or urine, dysuria,hematuria,  rash, arthralgias, visual complaints, headache, numbness weakness or ataxia or problems with walking or coordination,  change in mood/affect or memory.        Past Medical History:  ULCERATIVE COLITIS...................................................................................Marland KitchenHenrene Pastor / Donne Hazel + COLONIC POLYPS, HX OF (ICD-V12.72)  - colonoscopy 07/24/2007  - colonoscopy 06/08/09: mucosal changes consistent with moderately severe  left-sided ulcerative colitis involving the rectum to distal transverse colon confluent  HYPERLIPIDEMIA (ICD-272.4)  - target LDL < 130 (pos HBP, male, former smoker)  MORBID OBESITY (ICD-278.01)  - ideal = 168 target 208  - Did not keep nutrition appt 07/04/09  BENIGN POSITIONAL VERTIGO, HX OF (ICD-V12.49)  HYPERTENSION (ICD-401.9)  Microsocopic Hematuria, neg w/u 1999, resent to Urology August 03, 2009 >>> negative  PULMONARY EMBOLISM 07/06/2008  - Left DVT by venous doppler 07/07/2008 (asymptomatic) > neg venous doppler 11/01/08  - nl ECHO 07/08/08  HEALTH MAINTENANCE..............................................................................Marland KitchenWert  - Pneumovax 06/20/2008  - Td 09/03/2011  - CPX  09/10/2012  coronary calcifications ? significance 08/2009               Objective:   Physical Exam   wt 265 June 28, 2008 > 260 06/13/09 >266 11/07/2010 > 261 01/29/2011 > 09/03/2011  265 > 271 09/10/2012 > 12/17/2012  264  >  04/08/2013 267  Ambulatory somber wm appearing in no acute distress.  HEENT: bottom teeth gone, upper partials in place, and orophanx clear   Neck without JVD/Nodes/TM.  Lungs clear to A and P bilaterally without cough on insp or exp maneuvers  RRR no s3 or murmur or increase in P2 No edema   Abd soft obese no tenderness.-large panniculus.  Ext warm without calf tenderness, cyanosis clubbing or edema  Neuro mood/affect flat, somber nl sensorium, nl gait, no focal sensory, motor, cerebellar deficits noted, SLR neg. equal strength R post tib/ L DP stronger than same side pusles Pos L SLR with pain in back       CXR  09/10/2012 : There is scarring in the lung bases with interstitial thickening, stable. There is chronic central peribronchial thickening. Evidence of prior granulomatous disease. No edema or consolidation.       Assessment & Plan:

## 2013-04-08 NOTE — Progress Notes (Signed)
Quick Note:  Left pt msg on machine with results ______

## 2013-04-08 NOTE — Patient Instructions (Signed)
Aleve as per bottle with meals at least twice daily with meals  For excess pain > vicodin one every 4 hours  Heating pad and stay off your feet as much for next 3 days   Call on Monday 11/24 if not better for othopedic referral

## 2013-04-11 NOTE — Assessment & Plan Note (Signed)
ideal = 168 target 208 for BMI < 30  Wt Readings from Last 3 Encounters:  04/08/13 267 lb (121.11 kg)  03/30/13 265 lb (120.203 kg)  12/17/12 264 lb (119.75 kg)      Discussed impt of wt distribution leading to recurrent back issues > strongly urged neg cal balance, nutrition f/u prn

## 2013-04-11 NOTE — Assessment & Plan Note (Addendum)
He does not recall previous w/u or Rx but records indicate he was referred in 10/2010 for same problem to ortho with no sign findings   He has no radicular features and u/a does not support infection or stones  rec trial of nsaids, heat, rest, refer if needed Discussed obesity as main risk factor for recurrence.   See instructions for specific recommendations which were reviewed directly with the patient who was given a copy with highlighter outlining the key components.

## 2013-04-12 ENCOUNTER — Telehealth: Payer: Self-pay | Admitting: Pulmonary Disease

## 2013-04-12 DIAGNOSIS — M549 Dorsalgia, unspecified: Secondary | ICD-10-CM

## 2013-04-12 NOTE — Telephone Encounter (Signed)
Order placed. Darnelle Derrick, CMA  

## 2013-04-12 NOTE — Telephone Encounter (Signed)
Pt saw MW last week and was advised to call in on Monday with a report on how his back was doing. Pt states he rested a lot this weekend and followed recs and his back pain is not any better. Per last OV note MW stated if pain was no better then he would refer him to ortho. Dr. Melvyn Novas anyone in particular that you want the referral sent to? [place] Bing, CMA  Pt aware order will be placed, no need to call the pt back.[place] Bing, CMA

## 2013-04-12 NOTE — Telephone Encounter (Signed)
lmomtcb x1 

## 2013-04-12 NOTE — Telephone Encounter (Signed)
Record indicates we did referral in 2012 for same but I don't see the w/u in emr - if he doesn't remember who it was then ok to use [facility]

## 2013-06-10 ENCOUNTER — Encounter: Payer: Self-pay | Admitting: Internal Medicine

## 2013-06-10 ENCOUNTER — Ambulatory Visit (HOSPITAL_COMMUNITY): Payer: Medicare Other | Attending: Internal Medicine

## 2013-06-10 ENCOUNTER — Ambulatory Visit (INDEPENDENT_AMBULATORY_CARE_PROVIDER_SITE_OTHER): Payer: Medicare Other | Admitting: Internal Medicine

## 2013-06-10 VITALS — BP 138/90 | HR 87 | Ht 72.0 in | Wt 267.0 lb

## 2013-06-10 DIAGNOSIS — I10 Essential (primary) hypertension: Secondary | ICD-10-CM | POA: Insufficient documentation

## 2013-06-10 DIAGNOSIS — M79609 Pain in unspecified limb: Secondary | ICD-10-CM | POA: Diagnosis not present

## 2013-06-10 DIAGNOSIS — E669 Obesity, unspecified: Secondary | ICD-10-CM | POA: Diagnosis not present

## 2013-06-10 DIAGNOSIS — M25569 Pain in unspecified knee: Secondary | ICD-10-CM | POA: Diagnosis not present

## 2013-06-10 DIAGNOSIS — E785 Hyperlipidemia, unspecified: Secondary | ICD-10-CM | POA: Diagnosis not present

## 2013-06-10 DIAGNOSIS — M7989 Other specified soft tissue disorders: Secondary | ICD-10-CM

## 2013-06-10 DIAGNOSIS — Z87891 Personal history of nicotine dependence: Secondary | ICD-10-CM | POA: Diagnosis not present

## 2013-06-10 DIAGNOSIS — M25561 Pain in right knee: Secondary | ICD-10-CM | POA: Insufficient documentation

## 2013-06-10 NOTE — Assessment & Plan Note (Signed)
Since failed nsaids already and limping rec ortho eval asap> has seen Dr Tonita Cong for back.

## 2013-06-10 NOTE — Assessment & Plan Note (Signed)
ideal = 168 target 208 for BMI < 30  Wt Readings from Last 3 Encounters:  06/10/13 267 lb (121.11 kg)  04/08/13 267 lb (121.11 kg)  03/30/13 265 lb (120.203 kg)      Reaching a tipping point where back and knees will not allow him to get into neg cal balance and wt gain will make the limiting pain worse. Advised

## 2013-06-10 NOTE — Assessment & Plan Note (Signed)
-   venous dopplers 06/10/2013  >> neg bilaterally   He as h/o L dvt but both sides now nl > referrred to ortho for probable djd R knee not improving on nsaids

## 2013-06-10 NOTE — Progress Notes (Signed)
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     History of Present Illness  Admit 07/06/08 > 24/2010 admit with PE ok for coumadin by GI Bill Fox), presented with sob/ syncope   July 20, 2008 first post hosp fu for PE/ Left DVT ov no cp or sob, no rectal bleeding despite coumadin.   June 13, 2009 cpx, no change in rx   Sep 26, 2009 cc coronary calcifications on ct of kidneys and rectal pain with bm's but no bleeding.   December 25, 2009--Presents for an acute office visit. Complains of burning in right thigh from knee to hip x74month progressively getting worse. Describes a burning sensation along outer/mid thigh w/ prickling sensation that comes and goes.  Dx LFC syndrome  08/15/2010  cpx no nex c/os x cataracts worse and need to be removed per ophtamologist.    11/07/2010 ov/ Bill Fox cc new onset midline  low back pain abrupt onset x  One week no obvious injury, no pain down legs, [facility] showing [facility]  rx pred/ vicodin and improved.  Not using much aleve.  No problem with gait or pain with cough. 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 rec Ok to use aleve as long as you take it meals per the bottle instructions but try not to take daily   09/03/2011 f/u ov/Bill Fox cpx multiple chronic problems no new complaints, not using as much aleve and main c/os relate to burning with defecation attributed to UC > w/u in progress by PHenrene Pastorrec See derm/lose wt   09/10/2012 f/u  ov/Bill Fox comprehensive yearly eval for hbp Chief Complaint  Patient presents with  . Annual Exam    c/o "seeing jagged black lines" a couple times of year. Opthomologist staes that this could have meant a stroke.    only last 15-20 min no assoc ha, only happened two or three times for one year, no other neuro or viz changes, no amaurosis fugax, no claudicaiton sob or cp rec No change rx   12/17/2012 f/u ov/Bill Fox re hbp Chief Complaint  Patient presents with  . Follow-up    Pt states doing well and denies any co's today.    no further viz symptoms, doing gxt daily x 30 min but concerned not losing wt. Not sob with ex but "works up a good sweat" admits not really watching diet rec Weight control is simply a matter of calorie balance which needs to be tilted in your favor by eating less and exercising more.    Work on wax in L ear with over the counter kit then come see Bill Fox to have the ear looked at again to be sure it's cleared  04/08/2013 acute  ov/Bill Fox re: new back pain Chief Complaint  Patient presents with  . Acute Visit    Pt c/o low back pain x 1 wk- no known injury.   has tried a few aleve no  better No radiation, mostly L paralumbar or leg weakness, numbness Pain better sitting, lying down, worse standing No change urination  06/10/2013 f/u ov/Bill Fox re:  Chief Complaint  Patient presents with  . Follow-up    Pt c/o R leg pain behind knee Xseveral months, pain increases as day progresses.    knee was beginning to bother him before driving to [place] worse at end of day, better with naprosyn, no assoc swelling of calf or foot but does have h/o L DVT   Sleeping ok without nocturnal  or early am exacerbation  of respiratory  c/o's or need for noct saba. Also denies any obvious fluctuation of symptoms with weather or environmental changes or other aggravating or alleviating factors except as outlined above   Current Medications, Allergies, Past Medical History, Past Surgical  History, Family History, and Social History were reviewed in [employer] record.  ROS  The following are not active complaints unless bolded sore throat, dysphagia, dental problems, itching, sneezing,  nasal congestion or excess/ purulent secretions, ear ache,   fever, chills, sweats, unintended wt loss, pleuritic or exertional cp, hemoptysis,  orthopnea pnd or leg swelling, presyncope, palpitations, heartburn, abdominal pain, anorexia, nausea, vomiting, diarrhea  or change in bowel or urinary habits, change in stools or urine, dysuria,hematuria,  rash, arthralgias, visual complaints, headache, numbness weakness or ataxia or problems with walking or coordination,  change in mood/affect or memory.        Past Medical History:  ULCERATIVE COLITIS...................................................................................Marland KitchenHenrene Fox / Bill Fox + COLONIC POLYPS, HX OF (ICD-V12.72)  - colonoscopy 07/24/2007  - colonoscopy 06/08/09: mucosal changes consistent with moderately severe  left-sided ulcerative colitis involving the rectum to distal transverse colon confluent  HYPERLIPIDEMIA (ICD-272.4)  - target LDL < 130 (pos HBP, male, former smoker)  MORBID OBESITY (ICD-278.01)  - ideal = 168 target 208  - Did not keep nutrition appt 07/04/09  BENIGN POSITIONAL VERTIGO, HX OF (ICD-V12.49)  HYPERTENSION (ICD-401.9)  Microsocopic Hematuria, neg w/u 1999, resent to Urology August 03, 2009 >>> negative  PULMONARY EMBOLISM 07/06/2008  - Left DVT by venous doppler 07/07/2008 (asymptomatic) > neg venous doppler 11/01/08  - nl ECHO 07/08/08  DJD R knee pain .................................................................................  Bill Fox  HEALTH MAINTENANCE..............................................................................Marland KitchenWert  - Pneumovax 06/20/2008  - Td 09/03/2011  - CPX  09/10/2012  coronary calcifications ? significance 08/2009               Objective:    Physical Exam   wt 265 June 28, 2008 > 260 06/13/09 >266 11/07/2010 > 261 01/29/2011 > 09/03/2011  265 > 271 09/10/2012 > 12/17/2012  264  >  04/08/2013 267  > 267  06/10/2013  Ambulatory somber wm appearing in no acute distress.  HEENT: bottom teeth gone, upper partials in place, and orophanx clear   Neck without JVD/Nodes/TM.  Lungs clear to A and P bilaterally without cough on insp or exp maneuvers  RRR no s3 or murmur or increase in P2 No edema  Abd soft obese no tenderness.-large panniculus.  Ext warm without calf tenderness, cyanosis clubbing or edema  Neuro mood/affect flat, somber nl sensorium,   no focal sensory, motor, cerebellar deficits noted, SLR neg. equal strength R post tib/ L DP stronger than same side pusles MS  Limping,FROM but  mild creptance R knee, neg homan's no effusion         CXR  09/10/2012 : There is scarring in the lung bases with interstitial thickening, stable. There is chronic central peribronchial thickening.  Evidence of prior granulomatous disease. No edema or consolidation.       Assessment & Plan:

## 2013-06-10 NOTE — Patient Instructions (Signed)
Please see patient coordinator before you leave today  to schedule venous dopplers of both legs since you've had clots in the past  Call Dr Tonita Cong for eval of your knee and let me know if you need my help  Please schedule a follow up visit in 3 months but call sooner if needed

## 2013-06-11 NOTE — Progress Notes (Signed)
Quick Note:  Spoke with pt and notified of results per Dr. [name]. Pt verbalized understanding and denied any questions.  ______ 

## 2013-06-12 ENCOUNTER — Other Ambulatory Visit: Payer: Self-pay | Admitting: Internal Medicine

## 2013-06-23 DIAGNOSIS — M25569 Pain in unspecified knee: Secondary | ICD-10-CM | POA: Diagnosis not present

## 2013-06-23 DIAGNOSIS — M1710 Unilateral primary osteoarthritis, unspecified knee: Secondary | ICD-10-CM | POA: Diagnosis not present

## 2013-06-24 ENCOUNTER — Telehealth: Payer: Self-pay | Admitting: Internal Medicine

## 2013-06-24 MED ORDER — DOXAZOSIN MESYLATE 8 MG PO TABS: ORAL_TABLET | ORAL | Status: DC

## 2013-06-24 NOTE — Telephone Encounter (Signed)
Pt called back. Advised [facility]. Nothing further needed

## 2013-06-24 NOTE — Telephone Encounter (Signed)
Called # and pt is not in. Was advised to call after 10. Will do so

## 2013-07-12 DIAGNOSIS — M1710 Unilateral primary osteoarthritis, unspecified knee: Secondary | ICD-10-CM | POA: Diagnosis not present

## 2013-07-12 DIAGNOSIS — M25569 Pain in unspecified knee: Secondary | ICD-10-CM | POA: Diagnosis not present

## 2013-07-13 ENCOUNTER — Other Ambulatory Visit: Payer: Self-pay | Admitting: Internal Medicine

## 2013-07-16 ENCOUNTER — Other Ambulatory Visit (HOSPITAL_COMMUNITY): Payer: Self-pay | Admitting: Specialist

## 2013-07-16 ENCOUNTER — Ambulatory Visit (HOSPITAL_COMMUNITY)
Admission: RE | Admit: 2013-07-16 | Discharge: 2013-07-16 | Disposition: A | Discharge status: Home / Self Care | Payer: Medicare Other | Source: Ambulatory Visit | Attending: Specialist | Admitting: Specialist

## 2013-07-16 DIAGNOSIS — Z77018 Contact with and (suspected) exposure to other hazardous metals: Secondary | ICD-10-CM | POA: Diagnosis not present

## 2013-07-16 DIAGNOSIS — Z1389 Encounter for screening for other disorder: Secondary | ICD-10-CM | POA: Insufficient documentation

## 2013-07-16 DIAGNOSIS — Z135 Encounter for screening for eye and ear disorders: Secondary | ICD-10-CM | POA: Diagnosis not present

## 2013-07-16 DIAGNOSIS — M25561 Pain in right knee: Secondary | ICD-10-CM

## 2013-07-17 DIAGNOSIS — M1710 Unilateral primary osteoarthritis, unspecified knee: Secondary | ICD-10-CM | POA: Diagnosis not present

## 2013-07-26 DIAGNOSIS — M1710 Unilateral primary osteoarthritis, unspecified knee: Secondary | ICD-10-CM | POA: Diagnosis not present

## 2013-07-26 DIAGNOSIS — M25569 Pain in unspecified knee: Secondary | ICD-10-CM | POA: Diagnosis not present

## 2013-07-26 DIAGNOSIS — IMO0002 Reserved for concepts with insufficient information to code with codable children: Secondary | ICD-10-CM | POA: Diagnosis not present

## 2013-08-29 ENCOUNTER — Other Ambulatory Visit: Payer: Self-pay | Admitting: Internal Medicine

## 2013-09-06 ENCOUNTER — Telehealth: Payer: Self-pay | Admitting: Internal Medicine

## 2013-09-06 NOTE — Telephone Encounter (Signed)
lmtcb x1 

## 2013-09-07 MED ORDER — DOXAZOSIN MESYLATE 8 MG PO TABS: ORAL_TABLET | ORAL | Status: DC

## 2013-09-07 NOTE — Telephone Encounter (Signed)
appt set and refill sent. [place] Bing, CMA

## 2013-09-13 ENCOUNTER — Other Ambulatory Visit (INDEPENDENT_AMBULATORY_CARE_PROVIDER_SITE_OTHER): Payer: Medicare Other

## 2013-09-13 ENCOUNTER — Ambulatory Visit (INDEPENDENT_AMBULATORY_CARE_PROVIDER_SITE_OTHER)
Admission: RE | Admit: 2013-09-13 | Discharge: 2013-09-13 | Disposition: A | Discharge status: Home / Self Care | Payer: Medicare Other | Source: Ambulatory Visit | Attending: Internal Medicine | Admitting: Internal Medicine

## 2013-09-13 ENCOUNTER — Ambulatory Visit (INDEPENDENT_AMBULATORY_CARE_PROVIDER_SITE_OTHER): Payer: Medicare Other | Admitting: Internal Medicine

## 2013-09-13 ENCOUNTER — Encounter: Payer: Self-pay | Admitting: Internal Medicine

## 2013-09-13 VITALS — BP 144/90 | HR 73 | Temp 97.9°F | Ht 72.0 in | Wt 272.0 lb

## 2013-09-13 DIAGNOSIS — D508 Other iron deficiency anemias: Secondary | ICD-10-CM

## 2013-09-13 DIAGNOSIS — I10 Essential (primary) hypertension: Secondary | ICD-10-CM

## 2013-09-13 DIAGNOSIS — M25561 Pain in right knee: Secondary | ICD-10-CM

## 2013-09-13 DIAGNOSIS — E785 Hyperlipidemia, unspecified: Secondary | ICD-10-CM

## 2013-09-13 DIAGNOSIS — K5190 Ulcerative colitis, unspecified, without complications: Secondary | ICD-10-CM

## 2013-09-13 DIAGNOSIS — M25569 Pain in unspecified knee: Secondary | ICD-10-CM

## 2013-09-13 DIAGNOSIS — J8410 Pulmonary fibrosis, unspecified: Secondary | ICD-10-CM | POA: Diagnosis not present

## 2013-09-13 DIAGNOSIS — Z0000 Encounter for general adult medical examination without abnormal findings: Secondary | ICD-10-CM | POA: Diagnosis not present

## 2013-09-13 LAB — HEPATIC FUNCTION PANEL
ALK PHOS: 53 U/L (ref 39–117)
ALT: 22 U/L (ref 0–53)
AST: 20 U/L (ref 0–37)
Albumin: 3.8 g/dL (ref 3.5–5.2)
Bilirubin, Direct: 0.1 mg/dL (ref 0.0–0.3)
TOTAL PROTEIN: 7.9 g/dL (ref 6.0–8.3)
Total Bilirubin: 0.4 mg/dL (ref 0.3–1.2)

## 2013-09-13 LAB — CBC WITH DIFFERENTIAL/PLATELET
Basophils Absolute: 0 10*3/uL (ref 0.0–0.1)
Basophils Absolute: 0 10*3/uL (ref 0.0–0.1)
Basophils Relative: 0.5 % (ref 0.0–3.0)
Basophils Relative: 0.7 % (ref 0.0–3.0)
EOS PCT: 2.1 % (ref 0.0–5.0)
Eosinophils Absolute: 0.1 10*3/uL (ref 0.0–0.7)
Eosinophils Absolute: 0.1 10*3/uL (ref 0.0–0.7)
Eosinophils Relative: 1.8 % (ref 0.0–5.0)
HCT: 42.4 % (ref 39.0–52.0)
HCT: 42.5 % (ref 39.0–52.0)
HEMOGLOBIN: 14.1 g/dL (ref 13.0–17.0)
Hemoglobin: 14.1 g/dL (ref 13.0–17.0)
LYMPHS ABS: 1.2 10*3/uL (ref 0.7–4.0)
Lymphocytes Relative: 23.8 % (ref 12.0–46.0)
Lymphocytes Relative: 23.9 % (ref 12.0–46.0)
Lymphs Abs: 1.3 10*3/uL (ref 0.7–4.0)
MCHC: 33.2 g/dL (ref 30.0–36.0)
MCHC: 33.3 g/dL (ref 30.0–36.0)
MCV: 96 fl (ref 78.0–100.0)
MCV: 96.2 fl (ref 78.0–100.0)
MONOS PCT: 10.9 % (ref 3.0–12.0)
Monocytes Absolute: 0.6 10*3/uL (ref 0.1–1.0)
Monocytes Absolute: 0.6 10*3/uL (ref 0.1–1.0)
Monocytes Relative: 11 % (ref 3.0–12.0)
NEUTROS ABS: 3.3 10*3/uL (ref 1.4–7.7)
Neutro Abs: 3.4 10*3/uL (ref 1.4–7.7)
Neutrophils Relative %: 62.6 % (ref 43.0–77.0)
Neutrophils Relative %: 62.7 % (ref 43.0–77.0)
Platelets: 214 10*3/uL (ref 150.0–400.0)
Platelets: 219 10*3/uL (ref 150.0–400.0)
RBC: 4.42 Mil/uL (ref 4.22–5.81)
RBC: 4.42 Mil/uL (ref 4.22–5.81)
RDW: 14.4 % (ref 11.5–14.6)
RDW: 14.9 % — ABNORMAL HIGH (ref 11.5–14.6)
WBC: 5.2 10*3/uL (ref 4.5–10.5)
WBC: 5.4 10*3/uL (ref 4.5–10.5)

## 2013-09-13 LAB — TSH: TSH: 2.49 u[IU]/mL (ref 0.35–5.50)

## 2013-09-13 LAB — URINALYSIS, ROUTINE W REFLEX MICROSCOPIC
Bilirubin Urine: NEGATIVE
Ketones, ur: NEGATIVE
Leukocytes, UA: NEGATIVE
Nitrite: NEGATIVE
Specific Gravity, Urine: 1.02
Total Protein, Urine: NEGATIVE
Urine Glucose: NEGATIVE
Urobilinogen, UA: 0.2
pH: 6 (ref 5.0–8.0)

## 2013-09-13 LAB — BASIC METABOLIC PANEL
BUN: 12 mg/dL (ref 6–23)
CO2: 26 meq/L (ref 19–32)
CREATININE: 0.9 mg/dL (ref 0.4–1.5)
Calcium: 8.9 mg/dL (ref 8.4–10.5)
Chloride: 107 mEq/L (ref 96–112)
GFR: 85.26 mL/min (ref >60.00)
Glucose, Bld: 101 mg/dL — ABNORMAL HIGH (ref 70–99)
POTASSIUM: 3.7 meq/L (ref 3.5–5.1)
Sodium: 140 mEq/L (ref 135–145)

## 2013-09-13 LAB — LIPID PANEL
CHOL/HDL RATIO: 6
Cholesterol: 155 mg/dL (ref 0–200)
HDL: 27.5 mg/dL — AB (ref >39.00)
LDL Cholesterol: 102 mg/dL — ABNORMAL HIGH (ref 0–99)
Triglycerides: 127 mg/dL (ref 0.0–149.0)
VLDL: 25.4 mg/dL (ref 0.0–40.0)

## 2013-09-13 NOTE — Patient Instructions (Addendum)
Weight control is simply a matter of calorie balance which needs to be tilted in your favor by eating less and exercising more.  To get the most out of exercise, you need to be continuously aware that you are short of breath, but never out of breath, for 30 minutes daily. As you improve, it will actually be easier for you to do the same amount of exercise  in  30 minutes so always push to the level where you are short of breath.  If this does not result in gradual weight reduction then I strongly recommend you see a nutritionist with a food diary x 2 weeks so that we can work out a negative calorie balance which is universally effective in steady weight loss programs.  Think of your calorie balance like you do your bank account where in this case you want the balance to go down so you must take in less calories than you burn up.  It's just that simple:  Hard to do, but easy to understand.  Good luck!   Please remember to go to the lab and x-ray department downstairs for your tests - we will call you with the results when they are available.    Please schedule a follow up visit in 6 months but call sooner if needed

## 2013-09-13 NOTE — Assessment & Plan Note (Signed)
  Lab Results  Component Value Date   HGB 14.1 09/13/2013   HGB 14.1 09/13/2013   HGB 14.9 03/23/2013   HGB 15.1 12/25/2012     Resolved

## 2013-09-13 NOTE — Assessment & Plan Note (Signed)
Target LDL < 130 due to hbp and Type A personality  Lab Results  Component Value Date   CHOL 155 09/13/2013   HDL 27.50* 09/13/2013   LDLCALC 102* 09/13/2013   TRIG 127.0 09/13/2013   CHOLHDL 6 09/13/2013     Adequate control on present rx, reviewed > no change in rx needed

## 2013-09-13 NOTE — Assessment & Plan Note (Signed)
Lab Results  Component Value Date   CREATININE 0.9 09/13/2013   CREATININE 1.0 09/10/2012   CREATININE 1.0 09/03/2011    Adequate control on present rx, reviewed > no change in rx needed

## 2013-09-13 NOTE — Assessment & Plan Note (Signed)
-   referred to ortho 06/10/2013 as failed on nsaids   Reviewed need to f/u with Dr Tonita Cong

## 2013-09-13 NOTE — Assessment & Plan Note (Signed)
ideal = 168 target 208 for BMI < 30 Lab Results  Component Value Date   TSH 2.49 09/13/2013     Calorie balance issues reviewed

## 2013-09-13 NOTE — Progress Notes (Signed)
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     History of Present Illness  Admit 07/06/08 > 24/2010 admit with PE ok for coumadin by GI Henrene Pastor), presented with sob/ syncope   July 20, 2008 first post hosp fu for PE/ Left DVT ov no cp or sob, no rectal bleeding despite coumadin.   June 13, 2009 cpx, no change in rx   Sep 26, 2009 cc coronary calcifications on ct of kidneys and rectal pain with bm's but no bleeding.   December 25, 2009--Presents for an acute office visit. Complains of burning in right thigh from knee to hip x74month progressively getting worse. Describes a burning sensation along outer/mid thigh w/ prickling sensation that comes and goes.  Dx LFC syndrome  08/15/2010  cpx no nex c/os x cataracts worse and need to be removed per ophtamologist.    11/07/2010 ov/ Coralee Edberg cc new onset midline  low back pain abrupt onset x  One week no obvious injury, no pain down legs, [facility] showing DJD  rx pred/ vicodin and improved.  Not using much aleve.  No problem with gait or pain with cough. 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 rec Ok to use aleve as long as you take it meals per the bottle instructions but try not to take daily   09/03/2011 f/u ov/Swayzee Wadley cpx multiple chronic problems no new complaints, not using as much aleve and main c/os relate to burning with defecation attributed to UC > w/u in progress by PHenrene Pastorrec See derm/lose wt   09/10/2012 f/u  ov/Nyhla Mountjoy comprehensive yearly eval for hbp Chief Complaint  Patient presents with  . Annual Exam    c/o "seeing jagged black lines" a couple times of year. Opthomologist staes that this could have meant a stroke.    only last 15-20 min no assoc ha, only happened two or three times for one year, no other neuro or viz changes, no amaurosis fugax, no claudicaiton sob or cp rec No change rx   12/17/2012 f/u ov/Henritta Mutz re hbp Chief Complaint  Patient presents with  . Follow-up    Pt states doing well and denies any co's today.    no further viz symptoms, doing gxt daily x 30 min but concerned not losing wt. Not sob with ex but "works up a good sweat" admits not really watching diet rec Weight control is simply a matter of calorie balance which needs to be tilted in your favor by eating less and exercising more.    Work on wax in L ear with over the counter kit then come see Tammy NP to have the ear looked at again to be sure it's cleared  04/08/2013 acute  ov/Charlesa Ehle re: new back pain Chief Complaint  Patient presents with  . Acute Visit    Pt c/o low back pain x 1 wk- no known injury.   has tried a few aleve no  better No radiation, mostly L paralumbar or leg weakness, numbness Pain better sitting, lying down, worse standing No change urination  06/10/2013 f/u ov/Alyaan Budzynski re:  Chief Complaint  Patient presents with  . Follow-up    Pt c/o R leg pain behind knee Xseveral months, pain increases as day progresses.    knee was beginning to bother him before driving to [place] worse at end of day, better with naprosyn, no assoc swelling of calf or foot but does have h/o L DVT rec Please see patient coordinator before you leave today  to schedule venous dopplers of both legs since you've had clots in the past Call Dr Tonita Cong for eval of your knee and let me know if you need my help   09/13/2013 f/u ov/Kc Summerson re:  Obesity complicated by djd/ hbp/ulcerative colitis Chief Complaint  Patient presents with  .  Follow-up    Pt states that he is doing well and no co's today.    Not limited by breathing from desired activities  / no tia/ claudication.  No obvious day to day or daytime variabilty or assoc chronic cough or cp or chest tightness, subjective wheeze overt sinus or hb symptoms. No unusual exp hx or h/o childhood pna/ asthma or knowledge of premature birth.  Sleeping ok without nocturnal  or early am exacerbation  of respiratory  c/o's or need for noct saba. Also denies any obvious fluctuation of symptoms with weather or environmental changes or other aggravating or alleviating factors except as outlined above   Current Medications, Allergies, Complete Past Medical History, Past Surgical History, Family History, and Social History were reviewed in [employer] record.  ROS  The following are not active complaints unless bolded sore throat, dysphagia, dental problems, itching, sneezing,  nasal congestion or excess/ purulent secretions, ear ache,   fever, chills, sweats, unintended wt loss, pleuritic or exertional cp, hemoptysis,  orthopnea pnd or leg swelling, presyncope, palpitations, heartburn, abdominal pain, anorexia, nausea, vomiting, diarrhea  or change in bowel or urinary habits, change in stools or urine, dysuria,hematuria,  rash, arthralgias, visual complaints, headache, numbness weakness or ataxia or problems with walking or coordination,  change in mood/affect or memory.                Past Medical History:  ULCERATIVE COLITIS...................................................................................Marland KitchenHenrene Pastor / Donne Hazel + COLONIC POLYPS, HX OF (ICD-V12.72)  - colonoscopy 07/24/2007  - colonoscopy 06/08/09: mucosal changes consistent with moderately severe  left-sided ulcerative colitis involving the rectum to distal transverse colon confluent  HYPERLIPIDEMIA (ICD-272.4)  - target LDL < 130 (pos HBP, male, former smoker)  MORBID OBESITY (ICD-278.01)  -  ideal = 168 target 208  - Did not keep nutrition appt 07/04/09  BENIGN POSITIONAL VERTIGO, HX OF (ICD-V12.49)  HYPERTENSION (ICD-401.9)  Microsocopic Hematuria, neg w/u 1999, resent to Urology August 03, 2009 >>> negative  PULMONARY EMBOLISM 07/06/2008  - Left DVT by venous doppler 07/07/2008 (asymptomatic) > neg venous doppler 11/01/08  - nl ECHO 07/08/08  DJD R knee pain .................................................................................  [facility]  HEALTH MAINTENANCE..............................................................................Marland KitchenWert  - Pneumovax 06/20/2008  - Td 09/03/2011  - CPX  09/13/2013  coronary calcifications ? significance 08/2009               Objective:   Physical Exam   wt 265 June 28, 2008 > 260 06/13/09 >266 11/07/2010 > 261 01/29/2011 > 09/03/2011  265 > 271 09/10/2012 > 12/17/2012  264  >  04/08/2013 267  > 267  06/10/2013 > 09/13/2013  272  Ambulatory somber wm appearing in no acute distress.  HEENT: bottom teeth gone, upper partials in place, and orophanx clear   Neck without JVD/Nodes/TM.  Lungs clear to A and P bilaterally without cough on insp or exp maneuvers  RRR no s3 or murmur or increase in P2 No edema  Abd soft obese no tenderness.-large panniculus.  Ext warm without calf tenderness, cyanosis clubbing or edema  Neuro mood/affect flat, somber nl sensorium,   no focal sensory, motor, cerebellar deficits noted, SLR neg. equal strength R post tib/ L DP stronger than same side pusles MS  Limping,FROM but  mild creptance R knee, neg homan's no effusion         CXR  09/10/2012 : There is scarring in the lung bases with interstitial thickening, stable. There is chronic central peribronchial thickening. Evidence of prior granulomatous disease. No edema or consolidation.       Assessment & Plan:

## 2013-09-14 ENCOUNTER — Other Ambulatory Visit: Payer: Self-pay

## 2013-09-14 DIAGNOSIS — K5190 Ulcerative colitis, unspecified, without complications: Secondary | ICD-10-CM

## 2013-09-14 NOTE — Progress Notes (Signed)
Quick Note:  Spoke with pt and notified of results per Dr. [name]. Pt verbalized understanding and denied any questions.  ______ 

## 2013-10-26 ENCOUNTER — Telehealth: Payer: Self-pay | Admitting: Internal Medicine

## 2013-10-26 NOTE — Telephone Encounter (Signed)
Called spoke with pt. appt scheduled to come in Friday to see MW. Nothing further needed

## 2013-10-29 ENCOUNTER — Ambulatory Visit (INDEPENDENT_AMBULATORY_CARE_PROVIDER_SITE_OTHER): Payer: Medicare Other | Admitting: Internal Medicine

## 2013-10-29 ENCOUNTER — Encounter: Payer: Self-pay | Admitting: Internal Medicine

## 2013-10-29 VITALS — BP 120/72 | HR 68 | Temp 97.9°F | Ht 72.0 in | Wt 272.0 lb

## 2013-10-29 DIAGNOSIS — I10 Essential (primary) hypertension: Secondary | ICD-10-CM

## 2013-10-29 DIAGNOSIS — M7989 Other specified soft tissue disorders: Secondary | ICD-10-CM

## 2013-10-29 DIAGNOSIS — M25569 Pain in unspecified knee: Secondary | ICD-10-CM | POA: Diagnosis not present

## 2013-10-29 DIAGNOSIS — M25561 Pain in right knee: Secondary | ICD-10-CM

## 2013-10-29 NOTE — Patient Instructions (Addendum)
You are cleared for knee surgery   Keep previous appointment recommendations/ call sooner if needed

## 2013-10-29 NOTE — Progress Notes (Signed)
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     History of Present Illness  Admit 07/06/08 >2/ 24/2010 admit with PE ok for coumadin by GI Henrene Pastor), presented with sob/ syncope   July 20, 2008 first post hosp fu for PE/ Left DVT ov no cp or sob, no rectal bleeding despite coumadin.     11/07/2010 ov/ [name] cc new onset midline  low back pain abrupt onset x  One week no obvious injury, no pain down legs, went to [facility] 6/15 with xrays showing DJD  rx pred/ vicodin and improved.  Not using much aleve.  No problem with gait or pain with cough. rec Finish the prednisone Take aleve with meals if pain flares Only take the vicodin if pain is intolerable  Please see patient coordinator before you leave today  to schedule orthopedic evaluation>  No sign findings   06/10/2013 f/u ov/[name] re:  Chief Complaint  Patient presents with  . Follow-up    Pt c/o R leg pain behind knee Xseveral months, pain increases as day progresses.    knee was beginning to bother him before driving to [place] worse at end of day, better with naprosyn, no assoc swelling of calf or foot but does have h/o L DVT rec Please see patient coordinator before you leave today  to schedule venous dopplers of both legs since you've had clots in the past Call Dr Tonita Cong for eval of your knee and let me know if you need my help   10/29/2013 f/u ov/[name] re: preop for R arthroscopy on baby asa/ [facility]  With HBP/ UC Chief Complaint  Patient presents with  . Follow-up    Pt needs clearance for miniscus repair surgery- rt knee.  Pt states he is doing well and denies any co's today.      Activity limited by knee pain, not breathing, no ex cp  No obvious day to day or daytime  variabilty or assoc chronic cough   or chest tightness, subjective wheeze overt sinus or hb symptoms. No unusual exp hx or h/o childhood pna/ asthma or knowledge of premature birth.  Sleeping ok without nocturnal  or early am exacerbation  of respiratory  c/o's or need for noct saba. Also denies any obvious fluctuation of symptoms with weather or environmental changes or other aggravating or alleviating factors except as outlined above   Current Medications, Allergies, Complete Past Medical History, Past Surgical History, Family History, and Social History were reviewed in [employer] record.  ROS  The following are not active complaints unless bolded sore throat, dysphagia, dental problems, itching, sneezing,  nasal congestion or excess/ purulent secretions, ear ache,   fever, chills, sweats, unintended wt loss, pleuritic or exertional cp, hemoptysis,  orthopnea pnd or leg swelling, presyncope, palpitations, heartburn, abdominal pain, anorexia, nausea, vomiting, diarrhea  or change in bowel or urinary habits, change in stools or urine, dysuria,hematuria,  rash, arthralgias, visual complaints, headache, numbness weakness or ataxia or problems with walking or coordination,  change in mood/affect or memory.                Past Medical History:  ULCERATIVE COLITIS...................................................................................Marland KitchenHenrene Pastor / Donne Hazel +  COLONIC POLYPS, HX OF (ICD-V12.72)  - colonoscopy 07/24/2007  - colonoscopy 06/08/09: mucosal changes consistent with moderately severe  left-sided ulcerative colitis involving the rectum to distal transverse colon confluent  HYPERLIPIDEMIA (ICD-272.4)  - target LDL < 130 (pos HBP, male, former smoker)  MORBID OBESITY (ICD-278.01)  - ideal = 168 target 208  - Did not keep nutrition appt 07/04/09  BENIGN POSITIONAL VERTIGO, HX OF (ICD-V12.49)  HYPERTENSION (ICD-401.9)  Microsocopic Hematuria, neg w/u 1999, resent  to Urology August 03, 2009 >>> negative  PULMONARY EMBOLISM 07/06/2008  - Left DVT by venous doppler 07/07/2008 (asymptomatic) > neg venous doppler 11/01/08  - nl ECHO 07/08/08  DJD R knee pain .................................................................................  [place]  HEALTH MAINTENANCE..............................................................................Marland KitchenWert  - Pneumovax 06/20/2008  - Td 09/03/2011  - CPX  09/13/2013  coronary calcifications ? significance 08/2009               Objective:   Physical Exam   wt 265 June 28, 2008 > 260 06/13/09 >266 11/07/2010 > 261 01/29/2011 > 09/03/2011  265 > 271 09/10/2012 > 12/17/2012  264  >  04/08/2013 267  > 267  06/10/2013 > 09/13/2013  272 >   10/29/2013  272 Ambulatory somber wm appearing in no acute distress.  HEENT: bottom teeth gone, upper partials in place, and orophanx clear   Neck without JVD/Nodes/TM.  Lungs clear to A and P bilaterally without cough on insp or exp maneuvers  RRR no s3 or murmur or increase in P2 No edema  Abd soft obese no tenderness.-large panniculus.  Ext warm without calf tenderness, cyanosis clubbing or edema  Neuro mood/affect flat, somber nl sensorium,   no focal sensory, motor, cerebellar deficits noted, SLR neg. equal strength R post tib/ L DP stronger than same side pusles MS  Limping,FROM but  mild creptance R knee, neg homan's no effusion no calf tenderness         CXR  09/13/13 1. Low lung volumes.  2. No active cardiopulmonary abnormalities.        Assessment & Plan:

## 2013-10-30 NOTE — Assessment & Plan Note (Signed)
-   referred to ortho 06/10/2013 as failed on nsaids > for arthroscopy per Dr Rolley Sims for surgery

## 2013-10-30 NOTE — Assessment & Plan Note (Signed)
ideal = 168 target 208 for BMI < 30 Lab Results  Component Value Date   TSH 2.49 09/13/2013       Wt Readings from Last 3 Encounters:  10/29/13 272 lb (123.378 kg)  09/13/13 272 lb (123.378 kg)  06/10/13 267 lb (121.11 kg)      Can't lose wt with ex due to R knee pain, no sign contraindication to knee surgery other than risk dvt which as I understand is low since no prolonged rest needed for planned surgery.

## 2013-10-30 NOTE — Assessment & Plan Note (Signed)
-   venous dopplers 06/10/2013  >> neg bilaterally   Resolved, continue asa at least 81 mg daily unless obvious GIB from UC

## 2013-10-30 NOTE — Assessment & Plan Note (Signed)
Adequate control on present rx, reviewed > no change in rx needed   

## 2013-11-05 ENCOUNTER — Other Ambulatory Visit: Payer: Self-pay | Admitting: Internal Medicine

## 2013-11-10 ENCOUNTER — Other Ambulatory Visit: Payer: Self-pay | Admitting: Orthopedic Surgery

## 2013-11-18 ENCOUNTER — Other Ambulatory Visit: Payer: Self-pay | Admitting: Orthopedic Surgery

## 2013-11-22 ENCOUNTER — Other Ambulatory Visit: Payer: Self-pay | Admitting: Orthopedic Surgery

## 2013-11-22 NOTE — H&P (Signed)
Bill Fox is an 73 y.o. male.   Chief Complaint: R knee pain HPI: The patient is a 73 year old male who presents for a recheck of Follow-up Knee. The patient is being followed for their right knee pain. They are now 3 month(s) out from injury. Symptoms reported today include: pain. The patient feels that they are doing 20-30 percent better and report their pain level to be mild to moderate. Current treatment includes: relative rest, activity modification and NSAIDs. The following medication has been used for pain control: Aleve (as needed). The patient presents today following MRI. The patient has reported improvement of their symptoms with: Cortisone injections. Note for "Follow-up Knee": Bill Fox follows up today for his R knee. Reports since his MRI his pain is actually somewhat better, 30%. He denies swelling, instability, catching, locking. He does still note pain and tightness, mostly posteriorly. He has limited his activity and has not returned to walking on the treadmill, which he is hoping to do to lose weight. He has continued working full time where he stands all day. He does have prior hx of LE DVT 5 yrs ago after having pneumonia and a prolonged hospital stay. He was placed on coumadin x 3 months. He denies any other hx of DVT. Denies PE.   Past Medical History  Diagnosis Date  . Ulcerative colitis   . Morbid obesity   . Benign positional vertigo   . Hypertension   . Pulmonary embolism   . Coronary artery calcinosis   . Colon polyps   . Anxiety   . Hyperlipidemia     pt denies ever having high chol    Past Surgical History  Procedure Laterality Date  . Nose surgery    . Cataract extraction Left 08/26/12    Family History  Problem Relation Age of Onset  . Diabetes Mother   . Cancer Father     unknown type  . Stomach cancer Father   . Lung cancer Brother   . Colon cancer Neg Hx   . Diabetes Paternal Uncle     x 2   Social History:  reports that he quit smoking about  20 years ago. His smoking use included Cigarettes. He has a 30 pack-year smoking history. He has never used smokeless tobacco. He reports that he drinks about 1.2 ounces of alcohol per week. He reports that he does not use illicit drugs.  Allergies: No Known Allergies   (Not in a hospital admission)  No results found for this or any previous visit (from the past 48 hour(s)). No results found.  Review of Systems  Constitutional: Negative.   HENT: Negative.   Eyes: Negative.   Respiratory: Negative.   Cardiovascular: Negative.   Gastrointestinal: Negative.   Genitourinary: Negative.   Musculoskeletal: Positive for joint pain.  Skin: Negative.   Neurological: Negative.   Psychiatric/Behavioral: Negative.     There were no vitals taken for this visit. Physical Exam  Constitutional: He is oriented to person, place, and time. He appears well-developed and well-nourished.  HENT:  Head: Normocephalic and atraumatic.  Eyes: Conjunctivae and EOM are normal. Pupils are equal, round, and reactive to light.  Neck: Normal range of motion. Neck supple.  Cardiovascular: Normal rate and regular rhythm.   Respiratory: Effort normal and breath sounds normal.  GI: Soft. Bowel sounds are normal.  Musculoskeletal:  General Mental Status - Alert. General Appearance- pleasant. Not in acute distress. Orientation- Oriented X3. Build & Nutrition- Well nourished and Well  developed. Gait- Limping.   Musculoskeletal Lower Extremity Right Lower Extremity: Right Knee: Inspection and Palpation:Tenderness- posterior knee tender to palpation and posterior 1/3 of the medial joint line tender to palpation. no tenderness to palpation of the superior calf, no tenderness to palpation of the pes anserine bursa, no tenderness to palpation of the quadriceps tendon, no tenderness to palpation of the patellar tendon, no tenderness to palpation of the patella, no tenderness to palpation of the lateral joint  line, no tenderness to palpation of the fibular head and no tenderness to palpation of the peroneal nerve. Patellar Tendon- no pain to palpation of the patellar tendon. Effusion- trace. Tissue tension/texture is - soft. Crepitus- none. Pulses- 2+. Sensation- intact to light touch. Skin- Color- no ecchymosis and no erythema. Strength and Tone:Quadriceps- 5/5. Hamstrings- 5/5. ROM: Flexion:AROM- 120 . Extension:AROM- 0 . Stability- Valgus Laxity at 30- None. Valgus Laxity at 0- None. Varus Laxity at 30- None. Varus Laxity at 0- None. Lachman- Negative. Anterior Drawer Test - Negative. Posterior Drawer Test- Negative. Deformities/Malalignments/Discrepancies- no deformities noted. Special Tests:McMurray Test (lateral) - negative. McMurray Test (medial)- negative. Patellar Compression Pain- no pain.  Neurological: He is alert and oriented to person, place, and time. He has normal reflexes.  Skin: Skin is warm and dry.    Prior standing xrays reviewed Right knee with mild medial and moderate PF narrowing. Left knee with moderate medial jt space narrowing, both consistent with arthrosis. No collapse of the joint space.  MRI R knee images and report reviewed today by Dr. Tonita Cong with MMT (posterior horn), mod-severe chondral thinning medial compartment weightbearing surface with stress reaction right medial tibial plateau, no actual fx line noted.  Assessment/Plan R knee MMT, DJD  Pt with R knee pain, slightly improved since last visit, due to MMT, exacerbation underlying DJD, and stress rxn/bone bruising medial tibial plateau. We discussed relevant anatomy in detail. He currently has no mechanical symptoms and his pain is improved but not resolved; he has been resting. We discussed possible tx options, if he has continued symptoms given his MRI findings, refractory to steroid injection, would recommend arthroscopy, partial medial meniscectomy, debridement,  microfracture/drilling. Discussed the procedure itself as well as risks, complications and alternatives, including but not limited to DVT, PE, infx, bleeding, failure of procedure, need for secondary procedure, ongoing pain/symptoms, anesthesia risk, even stroke or death. Also discussed typical post-op protocols, time out of work, ice and elevation, HEP/quad strengthening, possible formal PT. Discussed need for DVT ppx post-op with ASA per protocol. We discussed the possibility of worsening pain and stiffness post-operatively due to DJD and the possible need for future steroid injections, viscosupplementation, repeat arthroscopy, even TKA. Since his pain is somewhat improved, he would like to see how his knee feels over the next few weeks and test it out with walking, getting back to the treadmill, which is reasonable. If his symptoms worsen, he develops mechanical symptoms, or fail to improve, he will call to proceed with knee arthroscopy. He was given a clearance letter for his PCP Dr. Melvyn Novas if he does decide to proceed with surgery. We discussed his prior hx of DVT, would plan Xarelto for a week post-op then convert to ASA for DVT ppx. He will follow up as needed and call when he is ready to proceed with surgery; would follow up 10-14 days post-op for suture removal. In the interim, continue quad strengthening, activity modifications, ice, elevation, NSAIDs prn.  Plan R knee arthroscopy, partial medial meniscectomy, debridement, microfracture/drilling  Josaphine Shimamoto,  Tametha Banning M. PA-C for Dr. Tonita Cong 11/22/2013, 11:40 AM

## 2013-11-26 ENCOUNTER — Encounter (HOSPITAL_BASED_OUTPATIENT_CLINIC_OR_DEPARTMENT_OTHER): Payer: Self-pay | Admitting: *Deleted

## 2013-11-29 ENCOUNTER — Encounter (HOSPITAL_BASED_OUTPATIENT_CLINIC_OR_DEPARTMENT_OTHER): Payer: Self-pay | Admitting: *Deleted

## 2013-11-29 NOTE — Progress Notes (Signed)
11/29/13 1216  OBSTRUCTIVE SLEEP APNEA  Have you ever been diagnosed with sleep apnea through a sleep study? No  Do you snore loudly (loud enough to be heard through closed doors)?  0  Do you often feel tired, fatigued, or sleepy during the daytime? 0  Has anyone observed you stop breathing during your sleep? 0  Do you have, or are you being treated for high blood pressure? 1  BMI more than 35 kg/m2? 1  Age over 71 years old? 1  Neck circumference greater than 40 cm/16 inches? 1  Gender: 1  Obstructive Sleep Apnea Score 5  Score 4 or greater  Results sent to PCP

## 2013-11-29 NOTE — Progress Notes (Signed)
NPO AFTER MN WITH EXCEPTION WATER/ GATORADE UNTIL 0700. ARRIVE AT 1100. NEEDS ISTAT. CURRENT EKG IN CHART AND EPIC. WILL TAKE DELZICOL AM DOS W/ SIPS OF WATER.

## 2013-12-03 ENCOUNTER — Ambulatory Visit (HOSPITAL_BASED_OUTPATIENT_CLINIC_OR_DEPARTMENT_OTHER)
Admission: RE | Admit: 2013-12-03 | Discharge: 2013-12-03 | Disposition: A | Discharge status: Home / Self Care | Payer: Medicare Other | Source: Ambulatory Visit | Attending: Specialist | Admitting: Specialist

## 2013-12-03 ENCOUNTER — Encounter (HOSPITAL_BASED_OUTPATIENT_CLINIC_OR_DEPARTMENT_OTHER)
Admission: RE | Disposition: A | Discharge status: Home / Self Care | Payer: Self-pay | Source: Ambulatory Visit | Attending: Specialist

## 2013-12-03 ENCOUNTER — Encounter (HOSPITAL_BASED_OUTPATIENT_CLINIC_OR_DEPARTMENT_OTHER): Payer: Self-pay | Admitting: *Deleted

## 2013-12-03 ENCOUNTER — Encounter (HOSPITAL_BASED_OUTPATIENT_CLINIC_OR_DEPARTMENT_OTHER): Payer: Medicare Other | Admitting: Anesthesiology

## 2013-12-03 ENCOUNTER — Ambulatory Visit (HOSPITAL_BASED_OUTPATIENT_CLINIC_OR_DEPARTMENT_OTHER): Payer: Medicare Other | Admitting: Anesthesiology

## 2013-12-03 DIAGNOSIS — M2390 Unspecified internal derangement of unspecified knee: Secondary | ICD-10-CM | POA: Diagnosis not present

## 2013-12-03 DIAGNOSIS — M23302 Other meniscus derangements, unspecified lateral meniscus, unspecified knee: Secondary | ICD-10-CM | POA: Diagnosis not present

## 2013-12-03 DIAGNOSIS — M1710 Unilateral primary osteoarthritis, unspecified knee: Secondary | ICD-10-CM | POA: Insufficient documentation

## 2013-12-03 DIAGNOSIS — I10 Essential (primary) hypertension: Secondary | ICD-10-CM | POA: Diagnosis not present

## 2013-12-03 DIAGNOSIS — M2240 Chondromalacia patellae, unspecified knee: Secondary | ICD-10-CM | POA: Diagnosis not present

## 2013-12-03 DIAGNOSIS — M25569 Pain in unspecified knee: Secondary | ICD-10-CM | POA: Diagnosis present

## 2013-12-03 DIAGNOSIS — Z6836 Body mass index (BMI) 36.0-36.9, adult: Secondary | ICD-10-CM | POA: Diagnosis not present

## 2013-12-03 DIAGNOSIS — Z86711 Personal history of pulmonary embolism: Secondary | ICD-10-CM | POA: Insufficient documentation

## 2013-12-03 DIAGNOSIS — Z86718 Personal history of other venous thrombosis and embolism: Secondary | ICD-10-CM | POA: Diagnosis not present

## 2013-12-03 DIAGNOSIS — M23305 Other meniscus derangements, unspecified medial meniscus, unspecified knee: Secondary | ICD-10-CM | POA: Insufficient documentation

## 2013-12-03 DIAGNOSIS — Z87891 Personal history of nicotine dependence: Secondary | ICD-10-CM | POA: Insufficient documentation

## 2013-12-03 DIAGNOSIS — S83206A Unspecified tear of unspecified meniscus, current injury, right knee, initial encounter: Secondary | ICD-10-CM

## 2013-12-03 DIAGNOSIS — IMO0002 Reserved for concepts with insufficient information to code with codable children: Secondary | ICD-10-CM | POA: Diagnosis not present

## 2013-12-03 DIAGNOSIS — Z79899 Other long term (current) drug therapy: Secondary | ICD-10-CM | POA: Diagnosis not present

## 2013-12-03 HISTORY — DX: Personal history of colon polyps, unspecified: Z86.0100

## 2013-12-03 HISTORY — DX: Unspecified tear of unspecified meniscus, current injury, right knee, initial encounter: S83.206A

## 2013-12-03 HISTORY — DX: Personal history of pulmonary embolism: Z86.711

## 2013-12-03 HISTORY — PX: KNEE ARTHROSCOPY WITH DRILLING/MICROFRACTURE: SHX6425

## 2013-12-03 HISTORY — DX: Other specified personal risk factors, not elsewhere classified: Z91.89

## 2013-12-03 HISTORY — DX: Chronic fatigue, unspecified: R53.82

## 2013-12-03 HISTORY — DX: Personal history of colonic polyps: Z86.010

## 2013-12-03 HISTORY — DX: Unspecified osteoarthritis, unspecified site: M19.90

## 2013-12-03 LAB — POCT I-STAT 4, (NA,K, GLUC, HGB,HCT)
Glucose, Bld: 100 mg/dL — ABNORMAL HIGH (ref 70–99)
HCT: 46 % (ref 39.0–52.0)
Hemoglobin: 15.6 g/dL (ref 13.0–17.0)
POTASSIUM: 4.2 meq/L (ref 3.7–5.3)
SODIUM: 142 meq/L (ref 137–147)

## 2013-12-03 SURGERY — ARTHROSCOPY, KNEE, WITH ABRASION ARTHROPLASTY OR MICROFRACTURE
Anesthesia: General | Site: Knee | Laterality: Right

## 2013-12-03 MED ORDER — CEFAZOLIN SODIUM 1-5 GM-% IV SOLN
INTRAVENOUS | Status: AC
  Filled 2013-12-03: qty 50

## 2013-12-03 MED ORDER — BUPIVACAINE-EPINEPHRINE (PF) 0.5% -1:200000 IJ SOLN
INTRAMUSCULAR | Status: DC | PRN
  Administered 2013-12-03: 20 mL

## 2013-12-03 MED ORDER — LIDOCAINE HCL (CARDIAC) 20 MG/ML IV SOLN
INTRAVENOUS | Status: DC | PRN
  Administered 2013-12-03: 100 mg via INTRAVENOUS

## 2013-12-03 MED ORDER — MIDAZOLAM HCL 2 MG/2ML IJ SOLN
INTRAMUSCULAR | Status: AC
  Filled 2013-12-03: qty 2

## 2013-12-03 MED ORDER — MIDAZOLAM HCL 5 MG/5ML IJ SOLN
INTRAMUSCULAR | Status: DC | PRN
  Administered 2013-12-03 (×2): 0.5 mg via INTRAVENOUS
  Administered 2013-12-03: 1 mg via INTRAVENOUS

## 2013-12-03 MED ORDER — LACTATED RINGERS IV SOLN
INTRAVENOUS | Status: DC | PRN
  Administered 2013-12-03: 12:00:00 via INTRAVENOUS

## 2013-12-03 MED ORDER — ONDANSETRON HCL 4 MG/2ML IJ SOLN
INTRAMUSCULAR | Status: DC | PRN
  Administered 2013-12-03: 4 mg via INTRAVENOUS

## 2013-12-03 MED ORDER — PROPOFOL 10 MG/ML IV BOLUS
INTRAVENOUS | Status: DC | PRN
  Administered 2013-12-03: 250 mg via INTRAVENOUS

## 2013-12-03 MED ORDER — DEXTROSE 5 % IV SOLN
3.0000 g | INTRAVENOUS | Status: AC
  Administered 2013-12-03: 3 g via INTRAVENOUS
  Filled 2013-12-03: qty 3000

## 2013-12-03 MED ORDER — LACTATED RINGERS IV SOLN
INTRAVENOUS | Status: DC
  Administered 2013-12-03: 12:00:00 via INTRAVENOUS
  Filled 2013-12-03: qty 1000

## 2013-12-03 MED ORDER — CEFAZOLIN SODIUM-DEXTROSE 2-3 GM-% IV SOLR
INTRAVENOUS | Status: AC
  Filled 2013-12-03: qty 50

## 2013-12-03 MED ORDER — FENTANYL CITRATE 0.05 MG/ML IJ SOLN
INTRAMUSCULAR | Status: DC | PRN
  Administered 2013-12-03 (×6): 25 ug via INTRAVENOUS
  Administered 2013-12-03: 50 ug via INTRAVENOUS

## 2013-12-03 MED ORDER — FENTANYL CITRATE 0.05 MG/ML IJ SOLN
INTRAMUSCULAR | Status: AC
  Filled 2013-12-03: qty 4

## 2013-12-03 MED ORDER — OXYCODONE-ACETAMINOPHEN 5-325 MG PO TABS: 1.0000 | ORAL_TABLET | ORAL | Status: DC | PRN

## 2013-12-03 MED ORDER — FENTANYL CITRATE 0.05 MG/ML IJ SOLN
25.0000 ug | INTRAMUSCULAR | Status: DC | PRN
  Filled 2013-12-03: qty 1

## 2013-12-03 MED ORDER — DEXAMETHASONE SODIUM PHOSPHATE 4 MG/ML IJ SOLN
INTRAMUSCULAR | Status: DC | PRN
  Administered 2013-12-03: 10 mg via INTRAVENOUS

## 2013-12-03 MED ORDER — ACETAMINOPHEN 10 MG/ML IV SOLN
INTRAVENOUS | Status: DC | PRN
  Administered 2013-12-03: 1000 mg via INTRAVENOUS

## 2013-12-03 MED ORDER — RIVAROXABAN 10 MG PO TABS: 10.0000 mg | ORAL_TABLET | Freq: Every day | ORAL | Status: DC

## 2013-12-03 MED ORDER — MEPERIDINE HCL 25 MG/ML IJ SOLN
6.2500 mg | INTRAMUSCULAR | Status: DC | PRN
  Filled 2013-12-03: qty 1

## 2013-12-03 MED ORDER — SODIUM CHLORIDE 0.9 % IR SOLN
Status: DC | PRN
  Administered 2013-12-03: 6000 mL

## 2013-12-03 MED ORDER — PROMETHAZINE HCL 25 MG/ML IJ SOLN
6.2500 mg | INTRAMUSCULAR | Status: DC | PRN
  Filled 2013-12-03: qty 1

## 2013-12-03 MED ORDER — LACTATED RINGERS IV SOLN
INTRAVENOUS | Status: DC
  Filled 2013-12-03: qty 1000

## 2013-12-03 MED ORDER — DOCUSATE SODIUM 100 MG PO CAPS: 100.0000 mg | ORAL_CAPSULE | Freq: Two times a day (BID) | ORAL | Status: DC | PRN

## 2013-12-03 MED ORDER — KETOROLAC TROMETHAMINE 30 MG/ML IJ SOLN
INTRAMUSCULAR | Status: DC | PRN
  Administered 2013-12-03: 30 mg via INTRAVENOUS

## 2013-12-03 MED ORDER — EPINEPHRINE HCL 1 MG/ML IJ SOLN
INTRAMUSCULAR | Status: DC | PRN
  Administered 2013-12-03: 2 mL

## 2013-12-03 SURGICAL SUPPLY — 36 items
BANDAGE ELASTIC 6 VELCRO ST LF (GAUZE/BANDAGES/DRESSINGS) ×3 IMPLANT
BLADE CUDA SHAVER 3.5 (BLADE) ×3 IMPLANT
BNDG COHESIVE 6X5 TAN NS LF (GAUZE/BANDAGES/DRESSINGS) ×3 IMPLANT
BOOTIES KNEE HIGH SLOAN (MISCELLANEOUS) ×3 IMPLANT
CANISTER SUCT LVC 12 LTR MEDI- (MISCELLANEOUS) ×3 IMPLANT
CLOTH BEACON ORANGE TIMEOUT ST (SAFETY) ×3 IMPLANT
DRAPE ARTHROSCOPY W/POUCH 114 (DRAPES) ×3 IMPLANT
DRSG PAD ABDOMINAL 8X10 ST (GAUZE/BANDAGES/DRESSINGS) ×2 IMPLANT
DURAPREP 26ML APPLICATOR (WOUND CARE) ×3 IMPLANT
GLOVE BIO SURGEON STRL SZ 6.5 (GLOVE) ×1 IMPLANT
GLOVE BIO SURGEONS STRL SZ 6.5 (GLOVE) ×1
GLOVE INDICATOR 6.5 STRL GRN (GLOVE) ×2 IMPLANT
GLOVE SURG SS PI 8.0 STRL IVOR (GLOVE) ×3 IMPLANT
GOWN STRL REUS W/ TWL XL LVL3 (GOWN DISPOSABLE) ×2 IMPLANT
GOWN STRL REUS W/TWL XL LVL3 (GOWN DISPOSABLE) ×6
IV NS IRRIG 3000ML ARTHROMATIC (IV SOLUTION) ×6 IMPLANT
KNEE WRAP E Z 3 GEL PACK (MISCELLANEOUS) ×3 IMPLANT
NDL FILTER BLUNT 18X1 1/2 (NEEDLE) ×1 IMPLANT
NDL SAFETY ECLIPSE 18X1.5 (NEEDLE) ×1 IMPLANT
NEEDLE FILTER BLUNT 18X 1/2SAF (NEEDLE) ×2
NEEDLE FILTER BLUNT 18X1 1/2 (NEEDLE) ×1 IMPLANT
NEEDLE HYPO 18GX1.5 SHARP (NEEDLE) ×3
PACK ARTHROSCOPY DSU (CUSTOM PROCEDURE TRAY) ×3 IMPLANT
PACK BASIN DAY SURGERY FS (CUSTOM PROCEDURE TRAY) ×3 IMPLANT
PADDING CAST ABS 4INX4YD NS (CAST SUPPLIES) ×2
PADDING CAST ABS COTTON 4X4 ST (CAST SUPPLIES) IMPLANT
PADDING CAST COTTON 6X4 STRL (CAST SUPPLIES) ×3 IMPLANT
SET ARTHROSCOPY TUBING (MISCELLANEOUS) ×3
SET ARTHROSCOPY TUBING LN (MISCELLANEOUS) ×1 IMPLANT
SPONGE GAUZE 4X4 12PLY (GAUZE/BANDAGES/DRESSINGS) ×3 IMPLANT
SPONGE GAUZE 4X4 12PLY STER LF (GAUZE/BANDAGES/DRESSINGS) ×2 IMPLANT
SUT ETHILON 4 0 PS 2 18 (SUTURE) ×3 IMPLANT
SYR 30ML LL (SYRINGE) ×3 IMPLANT
SYRINGE 10CC LL (SYRINGE) ×3 IMPLANT
TOWEL OR 17X24 6PK STRL BLUE (TOWEL DISPOSABLE) ×3 IMPLANT
WATER STERILE IRR 500ML POUR (IV SOLUTION) ×3 IMPLANT

## 2013-12-03 NOTE — Anesthesia Preprocedure Evaluation (Signed)
Anesthesia Evaluation  Patient identified by MRN, date of birth, ID band Patient awake    Reviewed: Allergy & Precautions, H&P , NPO status , Patient's Chart, lab work & pertinent test results  Airway Mallampati: II TM Distance: >3 FB Neck ROM: Full    Dental no notable dental hx. (+) Partial Upper, Edentulous Lower   Pulmonary former smoker, PE breath sounds clear to auscultation  Pulmonary exam normal       Cardiovascular hypertension, Pt. on medications Rhythm:Regular Rate:Normal     Neuro/Psych negative neurological ROS  negative psych ROS   GI/Hepatic negative GI ROS, Neg liver ROS,   Endo/Other  negative endocrine ROS  Renal/GU negative Renal ROS  negative genitourinary   Musculoskeletal negative musculoskeletal ROS (+)   Abdominal   Peds negative pediatric ROS (+)  Hematology negative hematology ROS (+)   Anesthesia Other Findings   Reproductive/Obstetrics negative OB ROS                           Anesthesia Physical Anesthesia Plan  ASA: III  Anesthesia Plan: General   Post-op Pain Management:    Induction: Intravenous  Airway Management Planned: LMA  Additional Equipment:   Intra-op Plan:   Post-operative Plan: Extubation in OR  Informed Consent: I have reviewed the patients History and Physical, chart, labs and discussed the procedure including the risks, benefits and alternatives for the proposed anesthesia with the patient or authorized representative who has indicated his/her understanding and acceptance.   Dental advisory given  Plan Discussed with: CRNA  Anesthesia Plan Comments:         Anesthesia Quick Evaluation

## 2013-12-03 NOTE — Brief Op Note (Signed)
12/03/2013  1:36 PM  PATIENT:  Bill Fox  73 y.o. male  PRE-OPERATIVE DIAGNOSIS:  RIGHT KNEE MEDIAL MENISCUS TEAR   DJD   POST-OPERATIVE DIAGNOSIS:  RIGHT KNEE MEDIAL MENISCUS TEAR   DJD   PROCEDURE:  Procedure(s): RIGHT KNEE ARTHROSCOPY PARTIAL MEDIAL MENISCECTOMY DEBRIDEMENT SYNOVECTOMY (Right)  SURGEON:  Surgeon(s) and Role:    * Johnn Hai, MD - Primary  PHYSICIAN ASSISTANT:   ASSISTANTS: Bissell   ANESTHESIA:   general  EBL:  Total I/O In: 300 [I.V.:300] Out: -   BLOOD ADMINISTERED:none  DRAINS: none   LOCAL MEDICATIONS USED:  MARCAINE     SPECIMEN:  No Specimen  DISPOSITION OF SPECIMEN:  N/A  COUNTS:  YES  TOURNIQUET:  * No tourniquets in log *  DICTATION: .Other Dictation: Dictation Number [id number]  PLAN OF CARE: Discharge to home after PACU  PATIENT DISPOSITION:  PACU - hemodynamically stable.   Delay start of Pharmacological VTE agent (>24hrs) due to surgical blood loss or risk of bleeding: no

## 2013-12-03 NOTE — Interval H&P Note (Signed)
History and Physical Interval Note:  12/03/2013 12:46 PM  Bill Fox  has presented today for surgery, with the diagnosis of RIGHT KNEE MEDIAL MENISCUS TEAR   DJD   The various methods of treatment have been discussed with the patient and family. After consideration of risks, benefits and other options for treatment, the patient has consented to  Procedure(s): RIGHT KNEE ARTHROSCOPY PARTIAL MEDIAL MENISCECTOMY DEBRIDEMENT MICROFRACTURE DRILLING  (Right) as a surgical intervention .  The patient's history has been reviewed, patient examined, no change in status, stable for surgery.  I have reviewed the patient's chart and labs.  Questions were answered to the patient's satisfaction.     [name]

## 2013-12-03 NOTE — Anesthesia Postprocedure Evaluation (Signed)
  Anesthesia Post-op Note  Patient: Bill Fox  Procedure(s) Performed: Procedure(s) (LRB): RIGHT KNEE ARTHROSCOPY PARTIAL MEDIAL MENISCECTOMY DEBRIDEMENT SYNOVECTOMY (Right)  Patient Location: PACU  Anesthesia Type: General  Level of Consciousness: awake and alert   Airway and Oxygen Therapy: Patient Spontanous Breathing  Post-op Pain: mild  Post-op Assessment: Post-op Vital signs reviewed, Patient's Cardiovascular Status Stable, Respiratory Function Stable, Patent Airway and No signs of Nausea or vomiting  Last Vitals:  Filed Vitals:   12/03/13 1530  BP: 128/76  Pulse: 61  Temp: 36.1 C  Resp: 14    Post-op Vital Signs: stable   Complications: No apparent anesthesia complications

## 2013-12-03 NOTE — Anesthesia Procedure Notes (Signed)
Procedure Name: LMA Insertion Date/Time: 12/03/2013 1:04 PM Performed by: Justice Rocher Pre-anesthesia Checklist: Patient identified, Emergency Drugs available, Suction available and Patient being monitored Patient Re-evaluated:Patient Re-evaluated prior to inductionOxygen Delivery Method: Circle System Utilized Preoxygenation: Pre-oxygenation with 100% oxygen Intubation Type: IV induction Ventilation: Mask ventilation without difficulty LMA: LMA inserted LMA Size: 5.0 Number of attempts: 1 Airway Equipment and Method: bite block Placement Confirmation: positive ETCO2 Tube secured with: Tape Dental Injury: Teeth and Oropharynx as per pre-operative assessment

## 2013-12-03 NOTE — Discharge Instructions (Signed)
ARTHROSCOPIC KNEE SURGERY HOME CARE INSTRUCTIONS   PAIN You will be expected to have a moderate amount of pain in the affected knee for approximately two weeks.  However, the first two to four days will be the most severe in terms of the pain you will experience.  Prescriptions have been provided for you to take as needed for the pain.  The pain can be markedly reduced by using the ice/compressive bandage given.  Exchange the ice packs whenever they thaw.  During the night, keep the bandage on because it will still provide some compression for the swelling.  Also, keep the leg elevated on pillows above your heart, and this will help alleviate the pain and swelling.  MEDICATION Prescriptions have been provided to take as needed for pain. To prevent blood clots, take Aspirin 341m daily with a meal if not on a blood thinner and if no history of stomach ulcers.  ACTIVITY It is preferred that you stay on bedrest for approximately 24 hours.  However, you may go to the bathroom with help.  After this, you can start to be up and about progressively more.  Remember that the swelling may still increase after three to four days if you are up and doing too much.  You may put as much weight on the affected leg as pain will allow.  Use your crutches for comfort and safety.  However, as soon as you are able, you may discard the crutches and go without them.   DRESSING Keep the current dressing as dry as possible.  Two days after your surgery, you may remove the ice/compressive wrap, and surgical dressing.  You may now take a shower, but do not scrub the sounds directly with soap.  Let water rinse over these and gently wipe with your hand.  Reapply band-aids over the puncture wounds and more gauze if needed.  A slight amount of thin drainage can be normal at this time, and do not let it frighten you.  Reapply the ice/compressive wrap.  You may now repeat this every day each time you shower.  SYMPTOMS TO REPORT TO  YOUR DOCTOR  -Extreme pain.  -Extreme swelling.  -Temperature above 101 degrees that does not come down with acetaminophen     (Tylenol).  -Any changes in the feeling, color or movement of your toes.  -Extreme redness, heat, swelling or drainage at your incision  EXERCISE It is preferred that you begin to exercise on the day of your surgery.  Straight leg raises and short arc quads should be begun the afternoon or evening of surgery and continued until you come back for your follow-up appointment.   Attached is an instruction sheet on how to perform these two simple exercises.  Do these at least three times per day if not more.  You may bend your knee as much as is comfortable.  The puncture wounds may occasionally be slightly uncomfortable with bending of the knee.  Do not let this frighten you.  It is important to keep your knee motion, but do not overdo it.  If you have significant pain, simply do not bend the knee as far.   You will be given more exercises to perform at your first return visit.   Xarelto for 7 days then aspirin 3254mer day with meal for 2 weeks RETURN APPOINTMENT Please make an appointment to be seen by your doctor in 14 days from your surgery.  Patient Signature:  ________________________________________________________  Nurse's Signature:  ________________________________________________________ Post Anesthesia Home Care Instructions  Activity: Get plenty of rest for the remainder of the day. A responsible adult should stay with you for 24 hours following the procedure.  For the next 24 hours, DO NOT: -Drive a car -Paediatric nurse -Drink alcoholic beverages -Take any medication unless instructed by your physician -Make any legal decisions or sign important papers.  Meals: Start with liquid foods such as gelatin or soup. Progress to regular foods as tolerated. Avoid greasy, spicy, heavy foods. If nausea and/or vomiting occur, drink only clear liquids until the  nausea and/or vomiting subsides. Call your physician if vomiting continues.  Special Instructions/Symptoms: Your throat may feel dry or sore from the anesthesia or the breathing tube placed in your throat during surgery. If this causes discomfort, gargle with warm salt water. The discomfort should disappear within 24 hours.

## 2013-12-03 NOTE — Transfer of Care (Signed)
Immediate Anesthesia Transfer of Care Note  Patient: Bill Fox [facility]  Procedure(s) Performed: Procedure(s) (LRB): RIGHT KNEE ARTHROSCOPY PARTIAL MEDIAL MENISCECTOMY DEBRIDEMENT SYNOVECTOMY (Right)  Patient Location: PACU  Anesthesia Type: General  Level of Consciousness: awake, sedated, patient cooperative and responds to stimulation  Airway & Oxygen Therapy: Patient Spontanous Breathing and Patient connected to face mask oxygen  Post-op Assessment: Report given to PACU RN, Post -op Vital signs reviewed and stable and Patient moving all extremities  Post vital signs: Reviewed and stable  Complications: No apparent anesthesia complications

## 2013-12-03 NOTE — H&P (View-Only) (Signed)
Bill Fox is an 73 y.o. male.   Chief Complaint: R knee pain HPI: The patient is a 73 year old male who presents for a recheck of Follow-up Knee. The patient is being followed for their right knee pain. They are now 3 month(s) out from injury. Symptoms reported today include: pain. The patient feels that they are doing 20-30 percent better and report their pain level to be mild to moderate. Current treatment includes: relative rest, activity modification and NSAIDs. The following medication has been used for pain control: Aleve (as needed). The patient presents today following MRI. The patient has reported improvement of their symptoms with: Cortisone injections. Note for "Follow-up Knee": Bill Fox follows up today for his R knee. Reports since his MRI his pain is actually somewhat better, 30%. He denies swelling, instability, catching, locking. He does still note pain and tightness, mostly posteriorly. He has limited his activity and has not returned to walking on the treadmill, which he is hoping to do to lose weight. He has continued working full time where he stands all day. He does have prior hx of LE DVT 5 yrs ago after having pneumonia and a prolonged hospital stay. He was placed on coumadin x 3 months. He denies any other hx of DVT. Denies PE.   Past Medical History  Diagnosis Date  . Ulcerative colitis   . Morbid obesity   . Benign positional vertigo   . Hypertension   . Pulmonary embolism   . Coronary artery calcinosis   . Colon polyps   . Anxiety   . Hyperlipidemia     pt denies ever having high chol    Past Surgical History  Procedure Laterality Date  . Nose surgery    . Cataract extraction Left 08/26/12    Family History  Problem Relation Age of Onset  . Diabetes Mother   . Cancer Father     unknown type  . Stomach cancer Father   . Lung cancer Brother   . Colon cancer Neg Hx   . Diabetes Paternal Uncle     x 2   Social History:  reports that he quit smoking about  20 years ago. His smoking use included Cigarettes. He has a 30 pack-year smoking history. He has never used smokeless tobacco. He reports that he drinks about 1.2 ounces of alcohol per week. He reports that he does not use illicit drugs.  Allergies: No Known Allergies   (Not in a hospital admission)  No results found for this or any previous visit (from the past 48 hour(s)). No results found.  Review of Systems  Constitutional: Negative.   HENT: Negative.   Eyes: Negative.   Respiratory: Negative.   Cardiovascular: Negative.   Gastrointestinal: Negative.   Genitourinary: Negative.   Musculoskeletal: Positive for joint pain.  Skin: Negative.   Neurological: Negative.   Psychiatric/Behavioral: Negative.     There were no vitals taken for this visit. Physical Exam  Constitutional: He is oriented to person, place, and time. He appears well-developed and well-nourished.  HENT:  Head: Normocephalic and atraumatic.  Eyes: Conjunctivae and EOM are normal. Pupils are equal, round, and reactive to light.  Neck: Normal range of motion. Neck supple.  Cardiovascular: Normal rate and regular rhythm.   Respiratory: Effort normal and breath sounds normal.  GI: Soft. Bowel sounds are normal.  Musculoskeletal:  General Mental Status - Alert. General Appearance- pleasant. Not in acute distress. Orientation- Oriented X3. Build & Nutrition- Well nourished and Well  developed. Gait- Limping.   Musculoskeletal Lower Extremity Right Lower Extremity: Right Knee: Inspection and Palpation:Tenderness- posterior knee tender to palpation and posterior 1/3 of the medial joint line tender to palpation. no tenderness to palpation of the superior calf, no tenderness to palpation of the pes anserine bursa, no tenderness to palpation of the quadriceps tendon, no tenderness to palpation of the patellar tendon, no tenderness to palpation of the patella, no tenderness to palpation of the lateral joint  line, no tenderness to palpation of the fibular head and no tenderness to palpation of the peroneal nerve. Patellar Tendon- no pain to palpation of the patellar tendon. Effusion- trace. Tissue tension/texture is - soft. Crepitus- none. Pulses- 2+. Sensation- intact to light touch. Skin- Color- no ecchymosis and no erythema. Strength and Tone:Quadriceps- 5/5. Hamstrings- 5/5. ROM: Flexion:AROM- 120 . Extension:AROM- 0 . Stability- Valgus Laxity at 30- None. Valgus Laxity at 0- None. Varus Laxity at 30- None. Varus Laxity at 0- None. Lachman- Negative. Anterior Drawer Test - Negative. Posterior Drawer Test- Negative. Deformities/Malalignments/Discrepancies- no deformities noted. Special Tests:McMurray Test (lateral) - negative. McMurray Test (medial)- negative. Patellar Compression Pain- no pain.  Neurological: He is alert and oriented to person, place, and time. He has normal reflexes.  Skin: Skin is warm and dry.    Prior standing xrays reviewed Right knee with mild medial and moderate PF narrowing. Left knee with moderate medial jt space narrowing, both consistent with arthrosis. No collapse of the joint space.  MRI R knee images and report reviewed today by Dr. Tonita Cong with MMT (posterior horn), mod-severe chondral thinning medial compartment weightbearing surface with stress reaction right medial tibial plateau, no actual fx line noted.  Assessment/Plan R knee MMT, DJD  Pt with R knee pain, slightly improved since last visit, due to MMT, exacerbation underlying DJD, and stress rxn/bone bruising medial tibial plateau. We discussed relevant anatomy in detail. He currently has no mechanical symptoms and his pain is improved but not resolved; he has been resting. We discussed possible tx options, if he has continued symptoms given his MRI findings, refractory to steroid injection, would recommend arthroscopy, partial medial meniscectomy, debridement,  microfracture/drilling. Discussed the procedure itself as well as risks, complications and alternatives, including but not limited to DVT, PE, infx, bleeding, failure of procedure, need for secondary procedure, ongoing pain/symptoms, anesthesia risk, even stroke or death. Also discussed typical post-op protocols, time out of work, ice and elevation, HEP/quad strengthening, possible formal PT. Discussed need for DVT ppx post-op with ASA per protocol. We discussed the possibility of worsening pain and stiffness post-operatively due to DJD and the possible need for future steroid injections, viscosupplementation, repeat arthroscopy, even TKA. Since his pain is somewhat improved, he would like to see how his knee feels over the next few weeks and test it out with walking, getting back to the treadmill, which is reasonable. If his symptoms worsen, he develops mechanical symptoms, or fail to improve, he will call to proceed with knee arthroscopy. He was given a clearance letter for his PCP Dr. Melvyn Novas if he does decide to proceed with surgery. We discussed his prior hx of DVT, would plan Xarelto for a week post-op then convert to ASA for DVT ppx. He will follow up as needed and call when he is ready to proceed with surgery; would follow up 10-14 days post-op for suture removal. In the interim, continue quad strengthening, activity modifications, ice, elevation, NSAIDs prn.  Plan R knee arthroscopy, partial medial meniscectomy, debridement, microfracture/drilling  Elenie Coven,  Rodriquez Thorner M. PA-C for Dr. Tonita Cong 11/22/2013, 11:40 AM

## 2013-12-03 NOTE — Interval H&P Note (Signed)
History and Physical Interval Note:  12/03/2013 12:46 PM  Bill Fox  has presented today for surgery, with the diagnosis of RIGHT KNEE MEDIAL MENISCUS TEAR   DJD   The various methods of treatment have been discussed with the patient and family. After consideration of risks, benefits and other options for treatment, the patient has consented to  Procedure(s): RIGHT KNEE ARTHROSCOPY PARTIAL MEDIAL MENISCECTOMY DEBRIDEMENT MICROFRACTURE DRILLING  (Right) as a surgical intervention .  The patient's history has been reviewed, patient examined, no change in status, stable for surgery.  I have reviewed the patient's chart and labs.  Questions were answered to the patient's satisfaction.     Mechele Kittleson C

## 2013-12-06 ENCOUNTER — Encounter (HOSPITAL_BASED_OUTPATIENT_CLINIC_OR_DEPARTMENT_OTHER): Payer: Self-pay | Admitting: Specialist

## 2013-12-06 ENCOUNTER — Telehealth: Payer: Self-pay

## 2013-12-06 NOTE — Telephone Encounter (Signed)
Pt aware and will come for labs.

## 2013-12-06 NOTE — Telephone Encounter (Signed)
Message copied by Algernon Huxley on Mon Dec 06, 2013  2:27 PM ------      Message from: Koa Zoeller, [place] R      Created: Tue Sep 14, 2013 10:26 AM      Regarding: CBC       Needs CBC, order in. ------

## 2013-12-06 NOTE — Op Note (Signed)
NAMEFLORIAN, CHAUCA NO.:  [id number]  MEDICAL RECORD NO.:  [id number]  LOCATION:                                 FACILITY:  PHYSICIAN:  Susa Day, M.D.    DATE OF BIRTH:  1941-03-23  DATE OF PROCEDURE:  12/03/2013 DATE OF DISCHARGE:  12/03/2013                              OPERATIVE REPORT   PREOPERATIVE DIAGNOSES:  Medial meniscus tear, chondromalacia, medial compartment, stress reaction in the medial tibial plateau.  POSTOPERATIVE DIAGNOSES:  Medial meniscus tear, chondromalacia, medial compartment, stress reaction in the medial tibial plateau, grade 3 chondromalacia patella, grade 3 chondromalacia lateral femoral condyle, radial tearing lateral meniscus.  PROCEDURE PERFORMED: 1. Right knee arthroscopy. 2. Partial medial and lateral meniscectomy. 3. Chondroplasty of patella, femoral sulcus, medial femoral condyle,     medial tibial plateau, lateral plateau.  ANESTHESIA:  General.  ASSISTANT:  Cleophas Dunker, PA, due to the patient's size, 122 kg and requiring positioning and opening __________ compartments in the medial and lateral joint.  BRIEF HISTORY:  A 73 year old, locking, popping, giving way, meniscus tear noted by MRI, stress reaction in the medial tibial plateau, also grade 4 lesion __________ microfracture, indicated for arthroscopy, partial meniscectomy, possible microfracture.  Risk and benefits discussed including bleeding, infection, damage to neurovascular structure, DVT, PE, anesthetic complications, etc.  TECHNIQUE:  With the patient in supine position, after induction of adequate general anesthesia, 3 g Kefzol, the right lower extremity was prepped and draped in usual sterile fashion.  A lateral parapatellar portal was fashioned with a #11 blade.  Ingress cannula atraumatically placed.  Irrigant was utilized to insufflate the joint.  Under direct visualization, a medial parapatellar portal was fashioned with a #11 blade  after localization with 18-gauge needle sparing the medial meniscus.  Noted was extensive grade 3 changes of femoral condyle and on the tibial plateau, light chondroplasty performed on both.  No grade 4 lesion was noted and we extensively probed the chondral surfaces __________ tear of the posterior third of the medial meniscus and the straight basket was introduced and performed a partial medial meniscectomy to a stable base, approximately one-third of the posterior third was excised.  Remnant stable to probe palpation.  Mild fraying of the ACL.  Lateral compartment revealed some radial tearing of the lateral meniscus, grade 3 changes, a light chondroplasty was performed here __________ lateral meniscus tear to a stable base.  Suprapatellar pouch, grade 3 lesion in the patellofemoral joint.  Light chondroplasty performed here.  Normal patellofemoral tracking.  Gutters unremarkable. Revisit all compartments.  No further pathology amenable to arthroscopic intervention.  I did therefore remove all instrumentation.  Portals were closed with 4-0 nylon simple sutures, 0.25% Marcaine with epinephrine was infiltrated in the joint.  Wound was dressed sterilely, awoken without difficulty, and transported to the recovery room in satisfactory condition.  The patient tolerated the procedure well.  No complications.  Assistant, Cleophas Dunker, [place]. Minimal blood loss.     Susa Day, M.D.     Geralynn Rile  D:  12/03/2013  T:  12/03/2013  Job:  [id number]

## 2013-12-08 ENCOUNTER — Encounter: Payer: Self-pay | Admitting: Internal Medicine

## 2013-12-08 ENCOUNTER — Other Ambulatory Visit (INDEPENDENT_AMBULATORY_CARE_PROVIDER_SITE_OTHER): Payer: Medicare Other

## 2013-12-08 ENCOUNTER — Other Ambulatory Visit: Payer: Self-pay

## 2013-12-08 DIAGNOSIS — K5190 Ulcerative colitis, unspecified, without complications: Secondary | ICD-10-CM | POA: Diagnosis not present

## 2013-12-08 DIAGNOSIS — K5120 Ulcerative (chronic) proctitis without complications: Secondary | ICD-10-CM

## 2013-12-08 LAB — CBC WITH DIFFERENTIAL/PLATELET
BASOS ABS: 0 10*3/uL (ref 0.0–0.1)
Basophils Relative: 0.3 % (ref 0.0–3.0)
Eosinophils Absolute: 0.2 10*3/uL (ref 0.0–0.7)
Eosinophils Relative: 2.7 % (ref 0.0–5.0)
HEMATOCRIT: 42 % (ref 39.0–52.0)
Hemoglobin: 14.3 g/dL (ref 13.0–17.0)
LYMPHS ABS: 1.3 10*3/uL (ref 0.7–4.0)
Lymphocytes Relative: 22.5 % (ref 12.0–46.0)
MCHC: 33.9 g/dL (ref 30.0–36.0)
MCV: 94.7 fl (ref 78.0–100.0)
MONO ABS: 0.7 10*3/uL (ref 0.1–1.0)
Monocytes Relative: 12.2 % — ABNORMAL HIGH (ref 3.0–12.0)
NEUTROS PCT: 62.3 % (ref 43.0–77.0)
Neutro Abs: 3.7 10*3/uL (ref 1.4–7.7)
PLATELETS: 209 10*3/uL (ref 150.0–400.0)
RBC: 4.44 Mil/uL (ref 4.22–5.81)
RDW: 14.7 % (ref 11.5–15.5)
WBC: 5.9 10*3/uL (ref 4.0–10.5)

## 2013-12-31 ENCOUNTER — Telehealth: Payer: Self-pay | Admitting: Internal Medicine

## 2013-12-31 NOTE — Telephone Encounter (Signed)
Pt states he is having problems with constipation. States he has a lot of pressure and feels like he needs to have a BM. Instructed pt to take 2-3 doses of Miralax to have BM. Pt verbalized understanding.

## 2014-01-14 ENCOUNTER — Other Ambulatory Visit: Payer: Self-pay | Admitting: Internal Medicine

## 2014-01-16 ENCOUNTER — Other Ambulatory Visit: Payer: Self-pay | Admitting: Internal Medicine

## 2014-01-22 ENCOUNTER — Other Ambulatory Visit: Payer: Self-pay | Admitting: Internal Medicine

## 2014-03-02 ENCOUNTER — Telehealth: Payer: Self-pay | Admitting: Internal Medicine

## 2014-03-02 NOTE — Telephone Encounter (Signed)
Spoke with the pt  He states that the otc ear wax removal kit not helping  Can not hear well due to left ear feeling blocked by wax  OV with TP for tomorrow am

## 2014-03-03 ENCOUNTER — Encounter (INDEPENDENT_AMBULATORY_CARE_PROVIDER_SITE_OTHER): Payer: Self-pay

## 2014-03-03 ENCOUNTER — Ambulatory Visit (INDEPENDENT_AMBULATORY_CARE_PROVIDER_SITE_OTHER): Payer: Medicare Other | Admitting: Adult Health

## 2014-03-03 ENCOUNTER — Other Ambulatory Visit: Payer: Self-pay | Admitting: Internal Medicine

## 2014-03-03 ENCOUNTER — Encounter: Payer: Self-pay | Admitting: Adult Health

## 2014-03-03 VITALS — BP 118/72 | HR 81 | Temp 97.6°F | Ht 70.0 in | Wt 276.6 lb

## 2014-03-03 DIAGNOSIS — Z23 Encounter for immunization: Secondary | ICD-10-CM

## 2014-03-03 DIAGNOSIS — H6123 Impacted cerumen, bilateral: Secondary | ICD-10-CM

## 2014-03-03 DIAGNOSIS — H6120 Impacted cerumen, unspecified ear: Secondary | ICD-10-CM | POA: Insufficient documentation

## 2014-03-03 NOTE — Assessment & Plan Note (Addendum)
Bilateral Cerumen Impaction  Ear irrigation w/ removal , tolerated well with clear EAC , Nml TM    Plan  May use Debrox ear drops as needed for ear wax.  Flu shot today .

## 2014-03-03 NOTE — Progress Notes (Signed)
   Subjective:    Patient ID: Bill Fox, male    DOB: [birth year], 73 y.o.   MRN: [id number]  HPI 73 yo hx of PE and Ulcerative Colitis   03/03/2014 Acute OV  Pt complains of decreased hearing, stopped up ears, feels they are full of wax. Used ear wax kit over the counter without any help. No ear pain , drainage or fever.    Review of Systems Constitutional:   No  weight loss, night sweats,  Fevers, chills, fatigue, or  lassitude.  HEENT:   No headaches,  Difficulty swallowing,  Tooth/dental problems, or  Sore throat,                No sneezing, itching, ear ache, nasal congestion, post nasal drip,            Objective:   Physical Exam GEN: A/Ox3; pleasant , NAD, well nourished   HEENT:  [place]/AT,  EACs- bilateral cerumen impaction, TMs-wnl (post irrigation) , NOSE-clear, THROAT-clear, no lesions, no postnasal drip or exudate noted.   NECK:  Supple w/ fair ROM; no JVD; normal carotid impulses w/o bruits; no thyromegaly or nodules palpated; no lymphadenopathy.  RESP  Clear  P & A; w/o, wheezes/ rales/ or rhonchi.no accessory muscle use, no dullness to percussion        Assessment & Plan:

## 2014-03-03 NOTE — Patient Instructions (Signed)
May use Debrox ear drops as needed for ear wax.  Flu shot today .

## 2014-03-07 ENCOUNTER — Telehealth: Payer: Self-pay

## 2014-03-07 NOTE — Telephone Encounter (Signed)
Message copied by Algernon Huxley on Mon Mar 07, 2014 11:22 AM ------      Message from: Dace Denn, [place] R      Created: Wed Dec 08, 2013  3:03 PM      Regarding: CBC       Pt needs CBC in October, order in ------

## 2014-03-07 NOTE — Telephone Encounter (Signed)
Spoke with pt and he is aware, order in epic.

## 2014-03-11 DIAGNOSIS — J069 Acute upper respiratory infection, unspecified: Secondary | ICD-10-CM | POA: Diagnosis not present

## 2014-03-14 ENCOUNTER — Telehealth: Payer: Self-pay | Admitting: Internal Medicine

## 2014-03-14 NOTE — Telephone Encounter (Signed)
Pt would like to come next week for labs due to bad cold. Orders are already in epic.

## 2014-03-31 ENCOUNTER — Other Ambulatory Visit (INDEPENDENT_AMBULATORY_CARE_PROVIDER_SITE_OTHER): Payer: Medicare Other

## 2014-03-31 DIAGNOSIS — K5120 Ulcerative (chronic) proctitis without complications: Secondary | ICD-10-CM | POA: Diagnosis not present

## 2014-03-31 LAB — CBC WITH DIFFERENTIAL/PLATELET
Basophils Absolute: 0 10*3/uL (ref 0.0–0.1)
Basophils Relative: 0.5 % (ref 0.0–3.0)
EOS PCT: 2.3 % (ref 0.0–5.0)
Eosinophils Absolute: 0.1 10*3/uL (ref 0.0–0.7)
HCT: 41 % (ref 39.0–52.0)
Hemoglobin: 13.6 g/dL (ref 13.0–17.0)
LYMPHS PCT: 23.4 % (ref 12.0–46.0)
Lymphs Abs: 1.2 10*3/uL (ref 0.7–4.0)
MCHC: 33.1 g/dL (ref 30.0–36.0)
MCV: 93.5 fl (ref 78.0–100.0)
MONOS PCT: 12.4 % — AB (ref 3.0–12.0)
Monocytes Absolute: 0.7 10*3/uL (ref 0.1–1.0)
NEUTROS PCT: 61.4 % (ref 43.0–77.0)
Neutro Abs: 3.2 10*3/uL (ref 1.4–7.7)
Platelets: 182 10*3/uL (ref 150.0–400.0)
RBC: 4.38 Mil/uL (ref 4.22–5.81)
RDW: 15.2 % (ref 11.5–15.5)
WBC: 5.3 10*3/uL (ref 4.0–10.5)

## 2014-04-05 ENCOUNTER — Other Ambulatory Visit: Payer: Self-pay

## 2014-04-05 DIAGNOSIS — K5190 Ulcerative colitis, unspecified, without complications: Secondary | ICD-10-CM

## 2014-04-07 ENCOUNTER — Other Ambulatory Visit: Payer: Self-pay | Admitting: Internal Medicine

## 2014-06-10 ENCOUNTER — Other Ambulatory Visit: Payer: Self-pay | Admitting: Internal Medicine

## 2014-06-27 ENCOUNTER — Telehealth: Payer: Self-pay | Admitting: *Deleted

## 2014-06-27 NOTE — Telephone Encounter (Signed)
Unable to leave a message. Will try again  Later.

## 2014-06-27 NOTE — Telephone Encounter (Signed)
-----   Message from Maury Dus, RN sent at 04/05/2014  1:36 PM EST ----- Regarding: CBC Pt needs CBC in 3 mth

## 2014-06-28 NOTE — Telephone Encounter (Signed)
Unable to reach patient. Will try again later.

## 2014-06-30 NOTE — Telephone Encounter (Signed)
Left a message for patient to call back. 

## 2014-07-01 NOTE — Telephone Encounter (Signed)
Patient will come for labs.  

## 2014-07-19 ENCOUNTER — Other Ambulatory Visit (INDEPENDENT_AMBULATORY_CARE_PROVIDER_SITE_OTHER): Payer: Medicare Other

## 2014-07-19 DIAGNOSIS — K5190 Ulcerative colitis, unspecified, without complications: Secondary | ICD-10-CM | POA: Diagnosis not present

## 2014-07-19 LAB — CBC WITH DIFFERENTIAL/PLATELET
BASOS ABS: 0 10*3/uL (ref 0.0–0.1)
BASOS PCT: 0.5 % (ref 0.0–3.0)
EOS PCT: 2.2 % (ref 0.0–5.0)
Eosinophils Absolute: 0.1 10*3/uL (ref 0.0–0.7)
HCT: 45 % (ref 39.0–52.0)
Hemoglobin: 15.2 g/dL (ref 13.0–17.0)
Lymphocytes Relative: 23.9 % (ref 12.0–46.0)
Lymphs Abs: 1.4 10*3/uL (ref 0.7–4.0)
MCHC: 33.9 g/dL (ref 30.0–36.0)
MCV: 91.7 fl (ref 78.0–100.0)
Monocytes Absolute: 0.7 10*3/uL (ref 0.1–1.0)
Monocytes Relative: 11.2 % (ref 3.0–12.0)
NEUTROS ABS: 3.6 10*3/uL (ref 1.4–7.7)
Neutrophils Relative %: 62.2 % (ref 43.0–77.0)
PLATELETS: 216 10*3/uL (ref 150.0–400.0)
RBC: 4.91 Mil/uL (ref 4.22–5.81)
RDW: 14.2 % (ref 11.5–15.5)
WBC: 5.8 10*3/uL (ref 4.0–10.5)

## 2014-07-20 ENCOUNTER — Other Ambulatory Visit: Payer: Self-pay

## 2014-07-20 DIAGNOSIS — K5120 Ulcerative (chronic) proctitis without complications: Secondary | ICD-10-CM

## 2014-07-23 ENCOUNTER — Other Ambulatory Visit: Payer: Self-pay | Admitting: Internal Medicine

## 2014-08-28 ENCOUNTER — Other Ambulatory Visit: Payer: Self-pay | Admitting: Internal Medicine

## 2014-08-29 ENCOUNTER — Encounter: Payer: Self-pay | Admitting: Internal Medicine

## 2014-10-24 ENCOUNTER — Other Ambulatory Visit: Payer: Self-pay | Admitting: Internal Medicine

## 2014-11-07 ENCOUNTER — Encounter: Payer: Self-pay | Admitting: Internal Medicine

## 2014-11-20 ENCOUNTER — Other Ambulatory Visit: Payer: Self-pay | Admitting: Internal Medicine

## 2014-11-25 ENCOUNTER — Other Ambulatory Visit: Payer: Self-pay | Admitting: Internal Medicine

## 2014-12-20 ENCOUNTER — Other Ambulatory Visit: Payer: Self-pay | Admitting: Internal Medicine

## 2015-01-13 ENCOUNTER — Ambulatory Visit (AMBULATORY_SURGERY_CENTER): Payer: Self-pay

## 2015-01-13 VITALS — Ht 72.0 in | Wt 279.0 lb

## 2015-01-13 DIAGNOSIS — Z860100 Personal history of colon polyps, unspecified: Secondary | ICD-10-CM

## 2015-01-13 NOTE — Progress Notes (Signed)
No allergies to eggs or soy No diet/weight loss meds No home oxygen No past problems with anesthesia  Refused Emmi

## 2015-01-16 ENCOUNTER — Ambulatory Visit: Payer: Medicare Other | Admitting: Internal Medicine

## 2015-01-22 ENCOUNTER — Other Ambulatory Visit: Payer: Self-pay | Admitting: Internal Medicine

## 2015-01-27 ENCOUNTER — Ambulatory Visit (AMBULATORY_SURGERY_CENTER): Payer: Medicare Other | Admitting: Internal Medicine

## 2015-01-27 ENCOUNTER — Encounter: Payer: Self-pay | Admitting: Internal Medicine

## 2015-01-27 VITALS — BP 141/73 | HR 74 | Temp 98.3°F | Resp 13 | Ht 72.0 in | Wt 279.0 lb

## 2015-01-27 DIAGNOSIS — I10 Essential (primary) hypertension: Secondary | ICD-10-CM | POA: Diagnosis not present

## 2015-01-27 DIAGNOSIS — K5190 Ulcerative colitis, unspecified, without complications: Secondary | ICD-10-CM | POA: Diagnosis not present

## 2015-01-27 DIAGNOSIS — Z86010 Personal history of colonic polyps: Secondary | ICD-10-CM | POA: Diagnosis not present

## 2015-01-27 DIAGNOSIS — Z8719 Personal history of other diseases of the digestive system: Secondary | ICD-10-CM | POA: Diagnosis not present

## 2015-01-27 DIAGNOSIS — R42 Dizziness and giddiness: Secondary | ICD-10-CM | POA: Diagnosis not present

## 2015-01-27 DIAGNOSIS — N400 Enlarged prostate without lower urinary tract symptoms: Secondary | ICD-10-CM | POA: Diagnosis not present

## 2015-01-27 DIAGNOSIS — K5150 Left sided colitis without complications: Secondary | ICD-10-CM

## 2015-01-27 MED ORDER — SODIUM CHLORIDE 0.9 % IV SOLN: 500.0000 mL | INTRAVENOUS | Status: DC

## 2015-01-27 NOTE — Patient Instructions (Signed)
YOU HAD AN ENDOSCOPIC PROCEDURE TODAY AT THE [facility]:   Refer to the procedure report that was given to you for any specific questions about what was found during the examination.  If the procedure report does not answer your questions, please call your gastroenterologist to clarify.  If you requested that your care partner not be given the details of your procedure findings, then the procedure report has been included in a sealed envelope for you to review at your convenience later.  YOU SHOULD EXPECT: Some feelings of bloating in the abdomen. Passage of more gas than usual.  Walking can help get rid of the air that was put into your GI tract during the procedure and reduce the bloating. If you had a lower endoscopy (such as a colonoscopy or flexible sigmoidoscopy) you may notice spotting of blood in your stool or on the toilet paper. If you underwent a bowel prep for your procedure, you may not have a normal bowel movement for a few days.  Please Note:  You might notice some irritation and congestion in your nose or some drainage.  This is from the oxygen used during your procedure.  There is no need for concern and it should clear up in a day or so.  SYMPTOMS TO REPORT IMMEDIATELY:   Following lower endoscopy (colonoscopy or flexible sigmoidoscopy):  Excessive amounts of blood in the stool  Significant tenderness or worsening of abdominal pains  Swelling of the abdomen that is new, acute  Fever of 100F or higher   For urgent or emergent issues, a gastroenterologist can be reached at any hour by calling [phone].   DIET: Your first meal following the procedure should be a small meal and then it is ok to progress to your normal diet. Heavy or fried foods are harder to digest and may make you feel nauseous or bloated.  Likewise, meals heavy in dairy and vegetables can increase bloating.  Drink plenty of fluids but you should avoid alcoholic beverages for 24  hours.  ACTIVITY:  You should plan to take it easy for the rest of today and you should NOT DRIVE or use heavy machinery until tomorrow (because of the sedation medicines used during the test).    FOLLOW UP: Our staff will call the number listed on your records the next business day following your procedure to check on you and address any questions or concerns that you may have regarding the information given to you following your procedure. If we do not reach you, we will leave a message.  However, if you are feeling well and you are not experiencing any problems, there is no need to return our call.  We will assume that you have returned to your regular daily activities without incident.  If any biopsies were taken you will be contacted by phone or by letter within the next 1-3 weeks.  Please call us at [phone] if you have not heard about the biopsies in 3 weeks.    SIGNATURES/CONFIDENTIALITY: You and/or your care partner have signed paperwork which will be entered into your electronic medical record.  These signatures attest to the fact that that the information above on your After Visit Summary has been reviewed and is understood.  Full responsibility of the confidentiality of this discharge information lies with you and/or your care-partner. 

## 2015-01-27 NOTE — Op Note (Signed)
[facility]  [address] [place], [id number]   COLONOSCOPY PROCEDURE REPORT  PATIENT: Bill Fox, Bill Fox  MR#: [id number] BIRTHDATE: [birth year] , 73  yrs. old GENDER: male ENDOSCOPIST: Eustace Quail, MD REFERRED DI:[id number] Program Recall PROCEDURE DATE:  01/27/2015 PROCEDURE:   Colonoscopy with biopsy  ASA CLASS:   Class III INDICATIONS:follow up of adenomatous colonic polyp(s), and chronic left-sided ulcerative colitis. Multiple colonoscopies previously. History of sporadic right-sided small adenomas. Last colonoscopy April 2013. No dysplasia on left-sided biopsies. MEDICATIONS: Monitored anesthesia care, Propofol 200 mg IV, and Lidocaine 200 mg IV  DESCRIPTION OF PROCEDURE:   After the risks benefits and alternatives of the procedure were thoroughly explained, informed consent was obtained.  The digital rectal exam revealed no abnormalities of the rectum.   The LB [id number]  endoscope was introduced through the anus and advanced to the cecum, which was identified by both the appendix and ileocecal valve. No adverse events experienced.   The quality of the prep was excellent. (MiraLax was used)  The instrument was then slowly withdrawn as the colon was fully examined. Estimated blood loss is zero unless otherwise noted in this procedure report.    COLON FINDINGS: Mucosa of the cecum, ascending colon, transverse colon were normal.  The left colon and rectosigmoid region revealed chronic scarring but no obvious active colitis.  Multiple biopsies taken from the descending, sigmoid, and rectum.  No polyps. Retroflexed views revealed internal hemorrhoids. The time to cecum = 3.0 Withdrawal time = 12.2   The scope was withdrawn and the procedure completed. COMPLICATIONS: There were no immediate complications.  ENDOSCOPIC IMPRESSION: Mucosa of the cecum, ascending colon, transverse colon were normal. The left colon and rectosigmoid region revealed  chronic scarring but no obvious active colitis.  Multiple biopsies taken from the descending, sigmoid, and rectum.  No polyps  RECOMMENDATIONS: 1.  Await biopsy results 2.  Repeat Colonoscopy in 3 years, if no dysplasia on biopsies and patient medically fit.  eSigned:  Eustace Quail, MD 01/27/2015 10:28 AM   cc: The Patient and Tanda Rockers, MD

## 2015-01-27 NOTE — Progress Notes (Signed)
Called to room to assist during endoscopic procedure.  Patient ID and intended procedure confirmed with present staff. Received instructions for my participation in the procedure from the performing physician.  

## 2015-01-27 NOTE — Progress Notes (Signed)
Report to PACU, RN, vss, BBS= Clear.  

## 2015-01-30 ENCOUNTER — Telehealth: Payer: Self-pay | Admitting: *Deleted

## 2015-01-30 ENCOUNTER — Ambulatory Visit (INDEPENDENT_AMBULATORY_CARE_PROVIDER_SITE_OTHER)
Admission: RE | Admit: 2015-01-30 | Discharge: 2015-01-30 | Disposition: A | Discharge status: Home / Self Care | Payer: Medicare Other | Source: Ambulatory Visit | Attending: Internal Medicine | Admitting: Internal Medicine

## 2015-01-30 ENCOUNTER — Ambulatory Visit (INDEPENDENT_AMBULATORY_CARE_PROVIDER_SITE_OTHER): Payer: Medicare Other | Admitting: Internal Medicine

## 2015-01-30 ENCOUNTER — Encounter: Payer: Self-pay | Admitting: Internal Medicine

## 2015-01-30 ENCOUNTER — Other Ambulatory Visit (INDEPENDENT_AMBULATORY_CARE_PROVIDER_SITE_OTHER): Payer: Medicare Other

## 2015-01-30 VITALS — BP 138/84 | HR 91 | Ht 72.0 in | Wt 280.0 lb

## 2015-01-30 DIAGNOSIS — Z23 Encounter for immunization: Secondary | ICD-10-CM | POA: Diagnosis not present

## 2015-01-30 DIAGNOSIS — I10 Essential (primary) hypertension: Secondary | ICD-10-CM

## 2015-01-30 DIAGNOSIS — D508 Other iron deficiency anemias: Secondary | ICD-10-CM

## 2015-01-30 DIAGNOSIS — K5120 Ulcerative (chronic) proctitis without complications: Secondary | ICD-10-CM | POA: Diagnosis not present

## 2015-01-30 DIAGNOSIS — E785 Hyperlipidemia, unspecified: Secondary | ICD-10-CM

## 2015-01-30 DIAGNOSIS — R911 Solitary pulmonary nodule: Secondary | ICD-10-CM

## 2015-01-30 DIAGNOSIS — H6123 Impacted cerumen, bilateral: Secondary | ICD-10-CM

## 2015-01-30 DIAGNOSIS — J449 Chronic obstructive pulmonary disease, unspecified: Secondary | ICD-10-CM | POA: Diagnosis not present

## 2015-01-30 LAB — HEPATIC FUNCTION PANEL
ALBUMIN: 3.9 g/dL (ref 3.5–5.2)
ALT: 16 U/L (ref 0–53)
AST: 17 U/L (ref 0–37)
Alkaline Phosphatase: 61 U/L (ref 39–117)
Bilirubin, Direct: 0 mg/dL (ref 0.0–0.3)
TOTAL PROTEIN: 7.8 g/dL (ref 6.0–8.3)
Total Bilirubin: 0.3 mg/dL (ref 0.2–1.2)

## 2015-01-30 LAB — URINALYSIS, ROUTINE W REFLEX MICROSCOPIC
BILIRUBIN URINE: NEGATIVE
KETONES UR: NEGATIVE
NITRITE: NEGATIVE
Specific Gravity, Urine: 1.025 (ref 1.000–1.030)
TOTAL PROTEIN, URINE-UPE24: NEGATIVE
URINE GLUCOSE: NEGATIVE
UROBILINOGEN UA: 0.2 (ref 0.0–1.0)
pH: 5.5 (ref 5.0–8.0)

## 2015-01-30 LAB — LIPID PANEL
CHOL/HDL RATIO: 5
Cholesterol: 155 mg/dL (ref 0–200)
HDL: 31.5 mg/dL — ABNORMAL LOW (ref >39.00)
LDL CALC: 96 mg/dL (ref 0–99)
NONHDL: 123.67
TRIGLYCERIDES: 138 mg/dL (ref 0.0–149.0)
VLDL: 27.6 mg/dL (ref 0.0–40.0)

## 2015-01-30 LAB — BASIC METABOLIC PANEL
BUN: 16 mg/dL (ref 6–23)
CHLORIDE: 106 meq/L (ref 96–112)
CO2: 24 mEq/L (ref 19–32)
Calcium: 9 mg/dL (ref 8.4–10.5)
Creatinine, Ser: 1.01 mg/dL (ref 0.40–1.50)
GFR: 77.21 mL/min (ref >60.00)
Glucose, Bld: 109 mg/dL — ABNORMAL HIGH (ref 70–99)
POTASSIUM: 3.9 meq/L (ref 3.5–5.1)
SODIUM: 140 meq/L (ref 135–145)

## 2015-01-30 LAB — IBC PANEL
IRON: 83 ug/dL (ref 42–165)
Saturation Ratios: 24.8 % (ref 20.0–50.0)
Transferrin: 239 mg/dL (ref 212.0–360.0)

## 2015-01-30 LAB — CBC WITH DIFFERENTIAL/PLATELET
Basophils Absolute: 0 10*3/uL (ref 0.0–0.1)
Basophils Relative: 0.5 % (ref 0.0–3.0)
EOS PCT: 2.3 % (ref 0.0–5.0)
Eosinophils Absolute: 0.2 10*3/uL (ref 0.0–0.7)
HCT: 44.9 % (ref 39.0–52.0)
Hemoglobin: 15 g/dL (ref 13.0–17.0)
LYMPHS ABS: 1.3 10*3/uL (ref 0.7–4.0)
Lymphocytes Relative: 20.1 % (ref 12.0–46.0)
MCHC: 33.3 g/dL (ref 30.0–36.0)
MCV: 90.6 fl (ref 78.0–100.0)
MONO ABS: 0.7 10*3/uL (ref 0.1–1.0)
Monocytes Relative: 9.7 % (ref 3.0–12.0)
NEUTROS PCT: 67.4 % (ref 43.0–77.0)
Neutro Abs: 4.5 10*3/uL (ref 1.4–7.7)
PLATELETS: 202 10*3/uL (ref 150.0–400.0)
RBC: 4.96 Mil/uL (ref 4.22–5.81)
RDW: 14.4 % (ref 11.5–15.5)
WBC: 6.7 10*3/uL (ref 4.0–10.5)

## 2015-01-30 LAB — TSH: TSH: 5.28 u[IU]/mL — AB (ref 0.35–4.50)

## 2015-01-30 NOTE — Patient Instructions (Addendum)
Prevnar and flu shot today   Please remember to go to the lab and x-ray department downstairs for your tests - we will call you with the results when they are available.    Weight control is simply a matter of calorie balance which needs to be tilted in your favor by eating less and exercising more.  To get the most out of exercise, you need to be continuously aware that you are short of breath, but never out of breath, for 30 minutes daily. As you improve, it will actually be easier for you to do the same amount of exercise  in  30 minutes so always push to the level where you are short of breath.  If this does not result in gradual weight reduction then I strongly recommend you see a nutritionist with a food diary x 2 weeks so that we can work out a negative calorie balance which is universally effective in steady weight loss programs.  Think of your calorie balance like you do your bank account where in this case you want the balance to go down so you must take in less calories than you burn up.  It's just that simple:  Hard to do, but easy to understand.  Good luck!    Please schedule a follow up visit in 3 months but call sooner if needed - see Tammy on this vist and she can recheck your ear wax issues and refer you to nutrition if you wish  Recheck tsh also

## 2015-01-30 NOTE — Progress Notes (Signed)
Subjective:    Patient ID: Bill Fox, male    DOB: Jul 29, 1940 .   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     History of Present Illness  Admit 07/06/08 >2/ 24/2010 admit with PE ok for coumadin by GI Henrene Pastor), presented with sob/ syncope   July 20, 2008 first post hosp fu for PE/ Left DVT ov no cp or sob, no rectal bleeding despite coumadin.     11/07/2010 ov/ Tonita Bills cc new onset midline  low back pain abrupt onset x  One week no obvious injury, no pain down legs, went to [facility] 6/15 with xrays showing DJD  rx pred/ vicodin and improved.  Not using much aleve.  No problem with gait or pain with cough. rec Finish the prednisone Take aleve with meals if pain flares Only take the vicodin if pain is intolerable  Please see patient coordinator before you leave today  to schedule orthopedic evaluation>  No sign findings   06/10/2013 f/u ov/Irania Durell re:  Chief Complaint  Patient presents with  . Follow-up    Pt c/o R leg pain behind knee Xseveral months, pain increases as day progresses.    knee was beginning to bother him before driving to [place] worse at end of day, better with naprosyn, no assoc swelling of calf or foot but does have h/o L DVT rec Please see patient coordinator before you leave today  to schedule venous dopplers of both legs since you've had clots in the past Call Dr Tonita Cong for eval of your knee and let me know if you need my help   10/29/2013 f/u ov/Dannel Rafter re: preop for R arthroscopy on baby asa/ [facility]  With HBP/ UC Chief Complaint  Patient presents with  . Follow-up    Pt needs clearance for miniscus repair surgery- rt knee.  Pt states he is doing well and denies any co's today.      Activity limited by knee pain, not breathing, no ex cp rec Cleared for  surgery   01/30/2015 f/u ov/Lagina Reader re: mo complicated by hbp/ hyperlipidemia Chief Complaint  Patient presents with  . Annual Exam    Pt fasting. He states doing well and denies any co's today.     Not limited by breathing from desired activities    No obvious day to day or daytime variabilty or assoc chronic cough   or chest tightness, subjective wheeze overt sinus or hb symptoms. No unusual exp hx or h/o childhood pna/ asthma or knowledge of premature birth.  Sleeping ok without nocturnal  or early am exacerbation  of respiratory  c/o's or need for noct saba. Also denies any obvious fluctuation of symptoms with weather or environmental changes or other aggravating or alleviating factors except as outlined above   Current Medications, Allergies, Complete Past Medical History, Past Surgical History, Family History, and Social History were reviewed in [employer] record.  ROS  The following are not active complaints unless bolded sore throat, dysphagia, dental problems, itching, sneezing,  nasal congestion or excess/ purulent secretions, ear ache,   fever, chills, sweats, unintended wt loss, pleuritic or exertional cp, hemoptysis,  orthopnea pnd or leg swelling, presyncope, palpitations, heartburn, abdominal pain, anorexia, nausea, vomiting, diarrhea  or change  in bowel or urinary habits, change in stools or urine, dysuria,hematuria,  rash, arthralgias, visual complaints, headache, numbness weakness or ataxia or problems with walking or coordination,  change in mood/affect or memory.                Past Medical History:  ULCERATIVE COLITIS...................................................................................Marland KitchenHenrene Pastor / Donne Hazel + COLONIC POLYPS, HX OF (ICD-V12.72)  - colonoscopy 07/24/2007  - colonoscopy 06/08/09: mucosal changes consistent with moderately severe  left-sided ulcerative colitis involving the rectum to distal transverse colon confluent    HYPERLIPIDEMIA (ICD-272.4)  - target LDL < 130 (pos HBP, male, former smoker)  MORBID OBESITY (ICD-278.01)  - ideal = 168 target 208  - Did not keep nutrition appt 07/04/09  BENIGN POSITIONAL VERTIGO, HX OF (ICD-V12.49)  HYPERTENSION (ICD-401.9)  Microsocopic Hematuria, neg w/u 1999, resent to Urology August 03, 2009 >>> negative  PULMONARY EMBOLISM 07/06/2008  - Left DVT by venous doppler 07/07/2008 (asymptomatic) > neg venous doppler 11/01/08  - nl  ECHO 07/08/08  DJD R knee pain .................................................................................          [facility]  HEALTH MAINTENANCE.......................................................................        Lukasz Rogus  - Pneumovax 06/20/2008  -  Prevnar 13 01/30/2015  - Td 09/03/2011  - CPX  01/30/2015  coronary calcifications ? significance 08/2009         Objective:   Physical Exam   Wt Readings from Last 3 Encounters:  01/30/15 280 lb (127.007 kg)  01/27/15 279 lb (126.554 kg)  01/13/15 279 lb (126.554 kg)    Vital signs reviewed   HEENT: 2 remaining upper teeth poor condition/ nl  turbinates, and orophanx.  Ears impacted with wax R > L    NECK :  without JVD/Nodes/TM/ nl carotid upstrokes bilaterally   LUNGS: no acc muscle use, clear to A and P bilaterally without cough on insp or exp maneuvers   CV:  RRR  no s3 or murmur or increase in P2, no edema   ABD:  soft and nontender with nl excursion in the supine position. No bruits or organomegaly, bowel sounds nl  MS:  warm without deformities, calf tenderness, cyanosis or clubbing  SKIN: warm and dry with multiple seb keratosis lesions    NEURO:  alert, approp, no deficits    CXR PA and Lateral:   01/30/2015 :     I personally reviewed images and agree with radiology impression as follows:    Pulmonary nodule versus prominent pulmonary vessel at the left hilum. Recommend chest CT in this former smoker.     Labs ordered/ reviewed:    Lab 01/30/15 0922   NA 140  K 3.9  CL 106  CO2 24  BUN 16  CREATININE 1.01  GLUCOSE 109*       Lab 01/30/15 0922  HGB 15.0  HCT 44.9  WBC 6.7  PLT 202.0     Lab Results  Component Value Date   TSH 5.28* 01/30/2015                   Assessment & Plan:

## 2015-01-30 NOTE — Progress Notes (Signed)
Quick Note:  LMTCB ______ 

## 2015-01-30 NOTE — Progress Notes (Signed)
Quick Note:  lmtcb ______ 

## 2015-01-30 NOTE — Assessment & Plan Note (Addendum)
Body mass index is 37.97 kg/(m^2).  Lab Results  Component Value Date   TSH 5.28* 01/30/2015     Contributing to gerd tendency/hbp/doe/reviewed need  achieve and maintain neg calorie balance  > recheck tsh next ov as trending up

## 2015-01-30 NOTE — Telephone Encounter (Signed)
  Follow up Call-  Call back number 01/27/2015  Post procedure Call Back phone  # [phone]  Permission to leave phone message Yes     Patient questions:  Do you have a fever, pain , or abdominal swelling? No. Pain Score  0 *  Have you tolerated food without any problems? Yes.    Have you been able to return to your normal activities? Yes.    Do you have any questions about your discharge instructions: Diet   No. Medications  No. Follow up visit  No.  Do you have questions or concerns about your Care? No.  Actions: * If pain score is 4 or above: No action needed, pain <4.

## 2015-01-31 ENCOUNTER — Telehealth: Payer: Self-pay | Admitting: Internal Medicine

## 2015-01-31 ENCOUNTER — Encounter: Payer: Self-pay | Admitting: Internal Medicine

## 2015-01-31 NOTE — Telephone Encounter (Signed)
Per EKG and Labs: Result Note     Call patient : Study is unremarkable, no change in recs  ---  I spoke with patient about results and he verbalized understanding and had no questions

## 2015-02-01 DIAGNOSIS — R911 Solitary pulmonary nodule: Secondary | ICD-10-CM | POA: Insufficient documentation

## 2015-02-01 NOTE — Assessment & Plan Note (Signed)
Discussed in detail all the  indications, usual  risks and alternatives  relative to the benefits with patient who agrees to proceed with CT chest next

## 2015-02-01 NOTE — Assessment & Plan Note (Signed)
Resolved

## 2015-02-01 NOTE — Assessment & Plan Note (Signed)
otc's reviewed > return for irrigation prn

## 2015-02-01 NOTE — Assessment & Plan Note (Signed)
Adequate control on present rx, reviewed > no change in rx needed   

## 2015-02-01 NOTE — Assessment & Plan Note (Signed)
Target LDL < 130 due to hbp and Type A personality   Lab Results  Component Value Date   CHOL 155 01/30/2015   HDL 31.50* 01/30/2015   LDLCALC 96 01/30/2015   TRIG 138.0 01/30/2015   CHOLHDL 5 01/30/2015    Adequate control on present rx, reviewed > no change in rx needed

## 2015-02-06 ENCOUNTER — Ambulatory Visit (INDEPENDENT_AMBULATORY_CARE_PROVIDER_SITE_OTHER)
Admission: RE | Admit: 2015-02-06 | Discharge: 2015-02-06 | Disposition: A | Discharge status: Home / Self Care | Payer: Medicare Other | Source: Ambulatory Visit | Attending: Internal Medicine | Admitting: Internal Medicine

## 2015-02-06 DIAGNOSIS — R911 Solitary pulmonary nodule: Secondary | ICD-10-CM | POA: Diagnosis not present

## 2015-02-06 DIAGNOSIS — J984 Other disorders of lung: Secondary | ICD-10-CM | POA: Diagnosis not present

## 2015-02-06 MED ORDER — IOHEXOL 300 MG/ML  SOLN
80.0000 mL | Freq: Once | INTRAMUSCULAR | Status: AC | PRN
  Administered 2015-02-06: 80 mL via INTRAVENOUS

## 2015-02-07 ENCOUNTER — Telehealth: Payer: Self-pay | Admitting: Internal Medicine

## 2015-02-07 NOTE — Telephone Encounter (Signed)
Called and informed pt of the results and recs per MW. Pt verbalized understanding and denied any further questions or concerns at this time.

## 2015-02-07 NOTE — Telephone Encounter (Signed)
Pt returned call  [phone]

## 2015-02-07 NOTE — Telephone Encounter (Signed)
Result Note     Call patient : Study is unremarkable, the spot on cxr is just a couple of vessels overlaping on the one view so not a nodule at all / no more f/u needed   Called pt and received busy signal [facility]

## 2015-02-15 DIAGNOSIS — J209 Acute bronchitis, unspecified: Secondary | ICD-10-CM | POA: Diagnosis not present

## 2015-02-20 ENCOUNTER — Other Ambulatory Visit: Payer: Self-pay | Admitting: Internal Medicine

## 2015-04-04 DIAGNOSIS — M1712 Unilateral primary osteoarthritis, left knee: Secondary | ICD-10-CM | POA: Diagnosis not present

## 2015-04-22 ENCOUNTER — Other Ambulatory Visit: Payer: Self-pay | Admitting: Internal Medicine

## 2015-04-26 ENCOUNTER — Telehealth: Payer: Self-pay

## 2015-04-26 NOTE — Telephone Encounter (Signed)
[facility] called to verify instructions for refill of 11m.  I clarified it should be take 3 tablets daily with a 90 day supply equaling 270.  She acknowledged and understood

## 2015-05-01 ENCOUNTER — Ambulatory Visit (INDEPENDENT_AMBULATORY_CARE_PROVIDER_SITE_OTHER): Payer: Medicare Other | Admitting: Adult Health

## 2015-05-01 ENCOUNTER — Other Ambulatory Visit (INDEPENDENT_AMBULATORY_CARE_PROVIDER_SITE_OTHER): Payer: Medicare Other

## 2015-05-01 ENCOUNTER — Ambulatory Visit: Payer: Medicare Other | Admitting: Adult Health

## 2015-05-01 ENCOUNTER — Encounter: Payer: Self-pay | Admitting: Adult Health

## 2015-05-01 VITALS — BP 120/78 | HR 82 | Temp 98.1°F | Ht 72.0 in | Wt 275.0 lb

## 2015-05-01 DIAGNOSIS — E038 Other specified hypothyroidism: Secondary | ICD-10-CM

## 2015-05-01 DIAGNOSIS — E785 Hyperlipidemia, unspecified: Secondary | ICD-10-CM | POA: Diagnosis not present

## 2015-05-01 DIAGNOSIS — I10 Essential (primary) hypertension: Secondary | ICD-10-CM | POA: Diagnosis not present

## 2015-05-01 DIAGNOSIS — R911 Solitary pulmonary nodule: Secondary | ICD-10-CM | POA: Diagnosis not present

## 2015-05-01 DIAGNOSIS — E039 Hypothyroidism, unspecified: Secondary | ICD-10-CM | POA: Insufficient documentation

## 2015-05-01 LAB — TSH: TSH: 2.63 u[IU]/mL (ref 0.35–4.50)

## 2015-05-01 NOTE — Progress Notes (Signed)
Chart and office note reviewed in detail  > agree with a/p as outlined    

## 2015-05-01 NOTE — Patient Instructions (Signed)
Continue on a low-fat, low-cholesterol diet Work on weight loss Labs today. Follow-up with Dr. Melvyn Novas in 4-6 moths and As needed

## 2015-05-01 NOTE — Assessment & Plan Note (Addendum)
Controlled .  Wt loss and low salt diet

## 2015-05-01 NOTE — Assessment & Plan Note (Signed)
CT chest w /no nodule noted.

## 2015-05-01 NOTE — Progress Notes (Signed)
Subjective:    Patient ID: Bill Fox, male    DOB: [birth year] .   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     History of Present Illness  Admit 07/06/08 >2/ 24/2010 admit with PE ok for coumadin by GI Henrene Pastor), presented with sob/ syncope   July 20, 2008 first post hosp fu for PE/ Left DVT ov no cp or sob, no rectal bleeding despite coumadin.     11/07/2010 ov/ [name] cc new onset midline  low back pain abrupt onset x  One week no obvious injury, no pain down legs, went to [place] ER 6/15 with xrays showing DJD  rx pred/ vicodin and improved.  Not using much aleve.  No problem with gait or pain with cough. rec Finish the prednisone Take aleve with meals if pain flares Only take the vicodin if pain is intolerable  Please see patient coordinator before you leave today  to schedule orthopedic evaluation>  No sign findings   06/10/2013 f/u ov/[name] re:  Chief Complaint  Patient presents with  . Follow-up    Pt c/o R leg pain behind knee Xseveral months, pain increases as day progresses.    knee was beginning to bother him before driving to [place] worse at end of day, better with naprosyn, no assoc swelling of calf or foot but does have h/o L DVT rec Please see patient coordinator before you leave today  to schedule venous dopplers of both legs since you've had clots in the past Call Dr Tonita Cong for eval of your knee and let me know if you need my help   10/29/2013 f/u ov/[name] re: preop for R arthroscopy on baby asa/ [facility]  With HBP/ UC Chief Complaint  Patient presents with  . Follow-up    Pt needs clearance for miniscus repair surgery- rt knee.  Pt states he is doing well and denies any co's today.      Activity limited by knee pain, not breathing, no ex cp rec Cleared for  surgery   01/30/2015 f/u ov/[name] re: mo complicated by hbp/ hyperlipidemia Chief Complaint  Patient presents with  . Annual Exam    Pt fasting. He states doing well and denies any co's today.    >>no changes   05/01/2015 Follow up : HTN , Hyperlipidemia , Obesity  Patient returns for a three-month follow-up He says overall that he has been doing okay. Did have a fall 2 months ago with left knee injury. He has been followed up with his orthopedist. chest x-ray last visit showed a possible lung nodule. Subsequent CT chest showed no nodule is stable granulomatous disease.  labs. Last visit showed slightly elevated TSH around 5 . Discussed getting a repeat today   Patient denies any chest pain, orthopnea, PND or leg swelling   Current Medications, Allergies, Complete Past Medical History, Past Surgical History, Family History, and Social History were reviewed in [employer] record.  ROS  The following are not active complaints unless bolded sore throat, dysphagia, dental problems, itching, sneezing,  nasal congestion or excess/ purulent secretions, ear ache,   fever, chills, sweats, unintended wt loss, pleuritic or exertional cp, hemoptysis,  orthopnea pnd or leg swelling, presyncope, palpitations, heartburn, abdominal pain, anorexia, nausea, vomiting,  diarrhea  or change in bowel or urinary habits, change in stools or urine, dysuria,hematuria,  rash, arthralgias, visual complaints, headache, numbness weakness or ataxia or problems with walking or coordination,  change in mood/affect or memory.                Past Medical History:  ULCERATIVE COLITIS...................................................................................Marland KitchenHenrene Pastor / Donne Hazel + COLONIC POLYPS, HX OF (ICD-V12.72)  - colonoscopy 07/24/2007  - colonoscopy 06/08/09: mucosal changes consistent with moderately severe  left-sided ulcerative colitis involving the rectum to distal transverse colon  confluent  HYPERLIPIDEMIA (ICD-272.4)  - target LDL < 130 (pos HBP, male, former smoker)  MORBID OBESITY (ICD-278.01)  - ideal = 168 target 208  - Did not keep nutrition appt 07/04/09  BENIGN POSITIONAL VERTIGO, HX OF (ICD-V12.49)  HYPERTENSION (ICD-401.9)  Microsocopic Hematuria, neg w/u 1999, resent to Urology August 03, 2009 >>> negative  PULMONARY EMBOLISM 07/06/2008  - Left DVT by venous doppler 07/07/2008 (asymptomatic) > neg venous doppler 11/01/08  - nl  ECHO 07/08/08  DJD R knee pain .................................................................................          [facility]  HEALTH MAINTENANCE.......................................................................        [name]  - Pneumovax 06/20/2008  -  Prevnar 13 01/30/2015  - Td 09/03/2011  - CPX  01/30/2015  coronary calcifications ? significance 08/2009         Objective:   Physical Exam    Vital signs reviewed   HEENT: 2 remaining upper teeth poor condition/ nl  turbinates, and orophanx. EACS clear    NECK :  without JVD/Nodes/TM/ nl carotid upstrokes bilaterally   LUNGS: no acc muscle use, clear to A and P bilaterally without cough on insp or exp maneuvers   CV:  RRR  no s3 or murmur or increase in P2, no edema   ABD:  soft and nontender with nl excursion in the supine position. No bruits or organomegaly, bowel sounds nl  MS:  warm without deformities, calf tenderness, cyanosis or clubbing  SKIN: warm and dry with multiple seb keratosis lesions    NEURO:  alert, approp, no deficits    CXR PA and Lateral:   01/30/2015 :       Pulmonary nodule versus prominent pulmonary vessel at the left hilum. Recommend chest CT in this former smoker.     Labs ordered/ reviewed:    Lab 01/30/15 0922  NA 140  K 3.9  CL 106  CO2 24  BUN 16  CREATININE 1.01  GLUCOSE 109*       Lab 01/30/15 0922  HGB 15.0  HCT 44.9  WBC 6.7  PLT 202.0     Lab Results  Component Value Date   TSH 5.28* 01/30/2015                    Assessment & Plan:

## 2015-05-01 NOTE — Addendum Note (Signed)
Addended by: Osa Craver on: 05/01/2015 10:13 AM   Modules accepted: Orders

## 2015-05-01 NOTE — Assessment & Plan Note (Signed)
Repeat TSH today

## 2015-05-01 NOTE — Progress Notes (Signed)
Quick Note:  LVM for patient to return call. ______ 

## 2015-05-01 NOTE — Assessment & Plan Note (Signed)
Cont on low fat chol diet

## 2015-05-02 DIAGNOSIS — M1712 Unilateral primary osteoarthritis, left knee: Secondary | ICD-10-CM | POA: Diagnosis not present

## 2015-05-03 NOTE — Progress Notes (Signed)
Quick Note:  Spoke with pt and notified of results per Rexene Edison, NP. Pt verbalized understanding and denied any questions.  ______

## 2015-05-04 ENCOUNTER — Other Ambulatory Visit: Payer: Self-pay | Admitting: Internal Medicine

## 2015-05-30 ENCOUNTER — Encounter: Payer: Self-pay | Admitting: Internal Medicine

## 2015-06-15 ENCOUNTER — Encounter: Payer: Self-pay | Admitting: Internal Medicine

## 2015-07-11 ENCOUNTER — Telehealth: Payer: Self-pay | Admitting: Internal Medicine

## 2015-07-11 ENCOUNTER — Encounter (HOSPITAL_COMMUNITY): Payer: Self-pay | Admitting: Emergency Medicine

## 2015-07-11 ENCOUNTER — Emergency Department (HOSPITAL_COMMUNITY)
Admission: EM | Admit: 2015-07-11 | Discharge: 2015-07-12 | DRG: 394 | Disposition: A | Discharge status: Home / Self Care | Payer: Medicare Other | Attending: Internal Medicine | Admitting: Internal Medicine

## 2015-07-11 DIAGNOSIS — Z6841 Body Mass Index (BMI) 40.0 and over, adult: Secondary | ICD-10-CM | POA: Diagnosis not present

## 2015-07-11 DIAGNOSIS — F419 Anxiety disorder, unspecified: Secondary | ICD-10-CM | POA: Diagnosis not present

## 2015-07-11 DIAGNOSIS — Z800 Family history of malignant neoplasm of digestive organs: Secondary | ICD-10-CM | POA: Diagnosis not present

## 2015-07-11 DIAGNOSIS — K625 Hemorrhage of anus and rectum: Secondary | ICD-10-CM | POA: Diagnosis present

## 2015-07-11 DIAGNOSIS — Z801 Family history of malignant neoplasm of trachea, bronchus and lung: Secondary | ICD-10-CM | POA: Diagnosis not present

## 2015-07-11 DIAGNOSIS — K648 Other hemorrhoids: Secondary | ICD-10-CM | POA: Diagnosis present

## 2015-07-11 DIAGNOSIS — Z86711 Personal history of pulmonary embolism: Secondary | ICD-10-CM

## 2015-07-11 DIAGNOSIS — E785 Hyperlipidemia, unspecified: Secondary | ICD-10-CM | POA: Diagnosis present

## 2015-07-11 DIAGNOSIS — I10 Essential (primary) hypertension: Secondary | ICD-10-CM | POA: Diagnosis not present

## 2015-07-11 DIAGNOSIS — Z833 Family history of diabetes mellitus: Secondary | ICD-10-CM

## 2015-07-11 DIAGNOSIS — Z87891 Personal history of nicotine dependence: Secondary | ICD-10-CM | POA: Diagnosis not present

## 2015-07-11 DIAGNOSIS — M1711 Unilateral primary osteoarthritis, right knee: Secondary | ICD-10-CM | POA: Diagnosis present

## 2015-07-11 DIAGNOSIS — R5383 Other fatigue: Secondary | ICD-10-CM | POA: Diagnosis not present

## 2015-07-11 DIAGNOSIS — K51911 Ulcerative colitis, unspecified with rectal bleeding: Secondary | ICD-10-CM | POA: Diagnosis present

## 2015-07-11 DIAGNOSIS — Z86010 Personal history of colonic polyps: Secondary | ICD-10-CM

## 2015-07-11 DIAGNOSIS — R42 Dizziness and giddiness: Secondary | ICD-10-CM | POA: Diagnosis not present

## 2015-07-11 DIAGNOSIS — Z79899 Other long term (current) drug therapy: Secondary | ICD-10-CM | POA: Diagnosis not present

## 2015-07-11 LAB — COMPREHENSIVE METABOLIC PANEL
ALBUMIN: 4 g/dL (ref 3.5–5.0)
ALT: 20 U/L (ref 17–63)
ANION GAP: 8 (ref 5–15)
AST: 23 U/L (ref 15–41)
Alkaline Phosphatase: 62 U/L (ref 38–126)
BILIRUBIN TOTAL: 0.6 mg/dL (ref 0.3–1.2)
BUN: 16 mg/dL (ref 6–20)
CHLORIDE: 108 mmol/L (ref 101–111)
CO2: 23 mmol/L (ref 22–32)
Calcium: 9.1 mg/dL (ref 8.9–10.3)
Creatinine, Ser: 1.1 mg/dL (ref 0.61–1.24)
GFR calc Af Amer: >60 mL/min (ref >60)
GLUCOSE: 113 mg/dL — AB (ref 65–99)
POTASSIUM: 3.8 mmol/L (ref 3.5–5.1)
Sodium: 139 mmol/L (ref 135–145)
TOTAL PROTEIN: 7.8 g/dL (ref 6.5–8.1)

## 2015-07-11 LAB — CBC
HEMATOCRIT: 45.4 % (ref 39.0–52.0)
HEMOGLOBIN: 14.4 g/dL (ref 13.0–17.0)
MCH: 29.8 pg (ref 26.0–34.0)
MCHC: 31.7 g/dL (ref 30.0–36.0)
MCV: 93.8 fL (ref 78.0–100.0)
Platelets: 203 10*3/uL (ref 150–400)
RBC: 4.84 MIL/uL (ref 4.22–5.81)
RDW: 14.3 % (ref 11.5–15.5)
WBC: 5.8 10*3/uL (ref 4.0–10.5)

## 2015-07-11 LAB — TYPE AND SCREEN
ABO/RH(D): O POS
ANTIBODY SCREEN: NEGATIVE

## 2015-07-11 LAB — ABO/RH: ABO/RH(D): O POS

## 2015-07-11 LAB — POC OCCULT BLOOD, ED: Fecal Occult Bld: POSITIVE — AB

## 2015-07-11 MED ORDER — SODIUM CHLORIDE 0.9 % IV BOLUS (SEPSIS)
1000.0000 mL | Freq: Once | INTRAVENOUS | Status: AC
  Administered 2015-07-11: 1000 mL via INTRAVENOUS

## 2015-07-11 MED ORDER — MESALAMINE 400 MG PO CPDR
800.0000 mg | DELAYED_RELEASE_CAPSULE | Freq: Three times a day (TID) | ORAL | Status: DC
  Administered 2015-07-11 – 2015-07-12 (×2): 800 mg via ORAL
  Filled 2015-07-11 (×6): qty 2

## 2015-07-11 MED ORDER — MIRTAZAPINE 30 MG PO TABS
30.0000 mg | ORAL_TABLET | Freq: Every day | ORAL | Status: DC
  Administered 2015-07-11: 30 mg via ORAL
  Filled 2015-07-11: qty 1

## 2015-07-11 MED ORDER — ONDANSETRON HCL 4 MG/2ML IJ SOLN: 4.0000 mg | Freq: Four times a day (QID) | INTRAMUSCULAR | Status: DC | PRN

## 2015-07-11 MED ORDER — ONDANSETRON HCL 4 MG PO TABS: 4.0000 mg | ORAL_TABLET | Freq: Four times a day (QID) | ORAL | Status: DC | PRN

## 2015-07-11 MED ORDER — DOXAZOSIN MESYLATE 4 MG PO TABS
4.0000 mg | ORAL_TABLET | Freq: Every day | ORAL | Status: DC
  Filled 2015-07-11: qty 1

## 2015-07-11 MED ORDER — SODIUM CHLORIDE 0.9% FLUSH
3.0000 mL | Freq: Two times a day (BID) | INTRAVENOUS | Status: DC
  Administered 2015-07-12 (×2): 3 mL via INTRAVENOUS

## 2015-07-11 NOTE — ED Notes (Signed)
Pt states yesterday began having dark red stools, hx of ulcerative colitis, began feeling lightheaded today. Is on medication for ulcerative colitis and has been taking it. PCP referred him here today. Denies pain.

## 2015-07-11 NOTE — Telephone Encounter (Signed)
Spoke with patient and he reports rectal bleeding x 2 days. Bright, red blood in stool and some times just bright, red blood. He reports he is light headed. Patient instructed to go to ED for evaluation.

## 2015-07-11 NOTE — ED Notes (Signed)
MD at bedside. 

## 2015-07-11 NOTE — H&P (Signed)
[facility] History and Physical  Bill Fox WOE:[id number] DOB: May 15, 1941 DOA: 07/11/2015  Referring physician: ED physician, Dr. Winfred Leeds  PCP: [name], MD   Chief Complaint: rectal bleed   HPI:  Pt is 75 year old pleasant male with UC, presents to [facility] emergency department with sudden onset of bright red blood per rectum. Patient denies any pain with bleeding episodes, reports associated symptoms of lightheadedness and dizziness especially worse with standing and improved with rest and sitting down. Patient also reports poor oral intake. No chest pain or shortness of breath, no abd pain. Also note, patient had last colonoscopy September 2016 that showed left colon and rectosigmoid region with chronic scarring but no evidence of active colitis, multiple biopsies were taken at that time with no evidence of malignancy.  In emergency department, patient is hemodynamically stable, vital signs stable, blood work stable. GI consulted by emergency room doctor and [place] asked to admit for further evaluation. Please note that FOBT was positive.  Assessment and Plan:  Principal Problem:   RECTAL BLEEDING in patient with known ulcerative colitis - Appreciate Dr. Lenna Sciara calling GI team for assistance - Admit to telemetry bed and monitor for signs of bleeding - Hemoglobin is currently stable, no indication for transfusion - CBC in the morning - Patient asked not to be stuck multiple times for blood work is he's difficult stick - it is reasonable to obtain blood work in the morning  Active Problems:   Morbid obesity ([place]) - BMI greater than 40    Essential hypertension - Stable on admission  SCDs for DVT prophylaxis   Radiological Exams on Admission: No results found.  Code Status: Full Family Communication: Pt and wife, at bedside Disposition Plan: Admit for further evaluation, anticipate discharge for 07/13/2015  [facility] [phone]   Review of Systems:   Constitutional:  Negative for diaphoresis.  HENT: Negative for hearing loss, ear pain, nosebleeds, congestion, sore throat, neck pain, tinnitus and ear discharge.   Eyes: Negative for blurred vision, double vision, photophobia, pain, discharge and redness.  Respiratory: Negative for cough, hemoptysis, sputum production, shortness of breath, wheezing and stridor.   Cardiovascular: Negative for chest pain, palpitations, orthopnea, claudication and leg swelling.  Gastrointestinal: Negative for nausea, vomiting and abdominal pain Genitourinary: Negative for dysuria, urgency, frequency, hematuria and flank pain.  Musculoskeletal: Negative for myalgias, back pain, joint pain and falls.  Skin: Negative for itching and rash.  Neurological: Per HPI  Endo/Heme/Allergies: Negative for environmental allergies and polydipsia. Does not bruise/bleed easily.  Psychiatric/Behavioral: Negative for suicidal ideas. The patient is not nervous/anxious.      Past Medical History  Diagnosis Date  . Ulcerative colitis   . Benign positional vertigo   . Hypertension   . Anxiety   . Hyperlipidemia     pt denies ever having high chol  . History of pulmonary embolism   . History of colon polyps     2011  &  2013 --  ADENOMATOUS  . Right knee meniscal tear   . DJD (degenerative joint disease)     RIGHT KNEE  . Chronic fatigue   . At risk for sleep apnea     STOP-BANG= 5     SENT TO PCP 11-29-2013  . Wears glasses     Past Surgical History  Procedure Laterality Date  . Nasal fracture surgery  1985  . Colonoscopy w/ polypectomy  MULTIPLE--  LAST ONE 09-12-2011  . Transthoracic echocardiogram  11-01-2008    MILD  LVH/  EF 16%/  GRADE I DIASTOLIC DYSFUNCTION/  MILD LAE  . Cataract extraction w/ intraocular lens implant Left 2014  . Knee arthroscopy with drilling/microfracture Right 12/03/2013    Procedure: RIGHT KNEE ARTHROSCOPY PARTIAL MEDIAL MENISCECTOMY DEBRIDEMENT SYNOVECTOMY;  Surgeon: Johnn Hai,  MD;  Location: [place];  Service: Orthopedics;  Laterality: Right;    Social History:  reports that he quit smoking about 22 years ago. His smoking use included Cigarettes. He has a 30 pack-year smoking history. He has never used smokeless tobacco. He reports that he drinks about 0.6 oz of alcohol per week. He reports that he does not use illicit drugs.  No Known Allergies  Family History  Problem Relation Age of Onset  . Diabetes Mother   . Cancer Father     unknown type  . Stomach cancer Father   . Lung cancer Brother   . Colon cancer Neg Hx   . Diabetes Paternal Uncle     x 2    Medication Sig  DELZICOL 400 MG CPDR DR capsule TAKE 4 CAPSULES THREE TIMES A DAY (NEEDS OFFICE VISIT FOR FURTHER REFILLS)  doxazosin (CARDURA) 8 MG tablet TAKE ONE-HALF (1/2) TABLET AT BEDTIME (MUST HAVE OFFICE VISIT FOR FURTHER REFILLS)  mercaptopurine (PURINETHOL) 50 MG tablet TAKE AS INSTRUCTED BY YOUR PRESCRIBER (NEEDS OFFICE VISIT FOR FURTHER REFILLS) Patient taking differently: take 180m daily  mirtazapine (REMERON) 30 MG tablet TAKE 1 TABLET AT BEDTIME (MUST MAKE APPOINTMENT BEFORE ANY FURTHER REFILLS)  naproxen sodium (ANAPROX) 220 MG tablet Take 440 mg by mouth 2 (two) times daily as needed (pain).    Physical Exam: Filed Vitals:   07/11/15 1246 07/11/15 1719 07/11/15 1739  BP: 138/83 134/83 138/85  Pulse: 95 87 80  Temp: 98.5 F (36.9 C)    TempSrc: Oral    Resp: 18  16  SpO2: 92% 94% 96%    Physical Exam  Constitutional: Appears well-developed and well-nourished. No distress.  HENT: Normocephalic. External right and left ear normal. Dry MM Eyes: Conjunctivae and EOM are normal. PERRLA, no scleral icterus.  Neck: Normal ROM. Neck supple. No JVD. No tracheal deviation. No thyromegaly.  CVS: RRR, S1/S2 +, no murmurs, no gallops, no carotid bruit.  Pulmonary: Effort and breath sounds normal, no stridor, rhonchi, wheezes, rales.  Abdominal: Soft. BS +,  no  distension, tenderness, rebound or guarding.  Musculoskeletal: Normal range of motion. No edema and no tenderness.  Lymphadenopathy: No lymphadenopathy noted, cervical, inguinal. Neuro: Alert. Normal reflexes, muscle tone coordination. No cranial nerve deficit. Skin: Skin is warm and dry. No rash noted. Not diaphoretic. No erythema. No pallor.  Psychiatric: Normal mood and affect. Behavior, judgment, thought content normal.   Labs on Admission:  Basic Metabolic Panel:  Recent Labs Lab 07/11/15 1320  NA 139  K 3.8  CL 108  CO2 23  GLUCOSE 113*  BUN 16  CREATININE 1.10  CALCIUM 9.1   Liver Function Tests:  Recent Labs Lab 07/11/15 1320  AST 23  ALT 20  ALKPHOS 62  BILITOT 0.6  PROT 7.8  ALBUMIN 4.0   CBC:  Recent Labs Lab 07/11/15 1320  WBC 5.8  HGB 14.4  HCT 45.4  MCV 93.8  PLT 203   EKG: sinus rhythm, rate in 70's   If 7PM-7AM, please contact night-coverage [email or web address] Password [facility]2/21/2017, 5:46 PM

## 2015-07-11 NOTE — ED Provider Notes (Addendum)
CSN: [id number]     Arrival date & time 07/11/15  1229 History   First MD Initiated Contact with Patient 07/11/15 1638     Chief Complaint  Patient presents with  . Rectal Bleeding  . Dizziness     (Consider location/radiation/quality/duration/timing/severity/associated sxs/prior Treatment) HPI Complaints of rectal bleeding and lightheadedness onset yesterday.. Other associated symptoms include lightheadedness, worse with standing, improved with supine position. Lightheadedness is improved today over yesterday. Patient presently hungry.. Denies abdominal pain denies nausea or vomiting. No treatment prior to coming here. No fever. No other associated symptoms Past Medical History  Diagnosis Date  . Ulcerative colitis   . Benign positional vertigo   . Hypertension   . Anxiety   . Hyperlipidemia     pt denies ever having high chol  . History of pulmonary embolism   . History of colon polyps     2011  &  2013 --  ADENOMATOUS  . Right knee meniscal tear   . DJD (degenerative joint disease)     RIGHT KNEE  . Chronic fatigue   . At risk for sleep apnea     STOP-BANG= 5     SENT TO PCP 11-29-2013  . Wears glasses    Past Surgical History  Procedure Laterality Date  . Nasal fracture surgery  1985  . Colonoscopy w/ polypectomy  MULTIPLE--  LAST ONE 09-12-2011  . Transthoracic echocardiogram  11-01-2008    MILD LVH/  EF 14%/  GRADE I DIASTOLIC DYSFUNCTION/  MILD LAE  . Cataract extraction w/ intraocular lens implant Left 2014  . Knee arthroscopy with drilling/microfracture Right 12/03/2013    Procedure: RIGHT KNEE ARTHROSCOPY PARTIAL MEDIAL MENISCECTOMY DEBRIDEMENT SYNOVECTOMY;  Surgeon: Johnn Hai, MD;  Location: [place];  Service: Orthopedics;  Laterality: Right;   Family History  Problem Relation Age of Onset  . Diabetes Mother   . Cancer Father     unknown type  . Stomach cancer Father   . Lung cancer Brother   . Colon cancer Neg Hx   . Diabetes  Paternal Uncle     x 2   Social History  Substance Use Topics  . Smoking status: Former Smoker -- 1.00 packs/day for 30 years    Types: Cigarettes    Quit date: 05/20/1993  . Smokeless tobacco: Never Used  . Alcohol Use: 0.6 oz/week    1 Cans of beer per week     Comment: beer on occ    Review of Systems  Gastrointestinal: Positive for blood in stool.  Neurological: Positive for light-headedness.  All other systems reviewed and are negative.     Allergies  Review of patient's allergies indicates no known allergies.  Home Medications   Prior to Admission medications   Medication Sig Start Date End Date Taking? Authorizing Provider  DELZICOL 400 MG CPDR DR capsule TAKE 4 CAPSULES THREE TIMES A DAY (NEEDS OFFICE VISIT FOR FURTHER REFILLS) 11/25/14  Yes Irene Shipper, MD  doxazosin (CARDURA) 8 MG tablet TAKE ONE-HALF (1/2) TABLET AT BEDTIME (MUST HAVE OFFICE VISIT FOR FURTHER REFILLS) 05/04/15  Yes Tanda Rockers, MD  mercaptopurine (PURINETHOL) 50 MG tablet TAKE AS INSTRUCTED BY YOUR PRESCRIBER (NEEDS OFFICE VISIT FOR FURTHER REFILLS) Patient taking differently: take 172m daily 04/24/15  Yes JIrene Shipper MD  mirtazapine (REMERON) 30 MG tablet TAKE 1 TABLET AT BEDTIME (MUST MAKE APPOINTMENT BEFORE ANY FURTHER REFILLS) 02/20/15  Yes MTanda Rockers MD  naproxen sodium (ANAPROX) 220 MG tablet  Take 440 mg by mouth 2 (two) times daily as needed (pain).   Yes Historical Provider, MD   BP 138/85 mmHg  Pulse 80  Temp(Src) 98.5 F (36.9 C) (Oral)  Resp 16  SpO2 96% Physical Exam  Constitutional: He appears well-developed and well-nourished.  HENT:  Head: Normocephalic and atraumatic.  Eyes: Conjunctivae are normal. Pupils are equal, round, and reactive to light.  Neck: Neck supple. No tracheal deviation present. No thyromegaly present.  Cardiovascular: Normal rate and regular rhythm.   No murmur heard. Pulmonary/Chest: Effort normal and breath sounds normal.  Abdominal: Soft.  Bowel sounds are normal. He exhibits no distension. There is no tenderness.  Genitourinary: Guaiac positive stool.  Reddish-brown stool Hemoccult positive  Musculoskeletal: Normal range of motion. He exhibits no edema or tenderness.  Neurological: He is alert. Coordination normal.  Skin: Skin is warm and dry. No rash noted.  Psychiatric: He has a normal mood and affect.  Nursing note and vitals reviewed.   ED Course  Procedures (including critical care time) Labs Review Labs Reviewed  COMPREHENSIVE METABOLIC PANEL - Abnormal; Notable for the following:    Glucose, Bld 113 (*)    All other components within normal limits  POC OCCULT BLOOD, ED - Abnormal; Notable for the following:    Fecal Occult Bld POSITIVE (*)    All other components within normal limits  CBC  POC OCCULT BLOOD, ED  TYPE AND SCREEN  ABO/RH    Imaging Review No results found. I have personally reviewed and evaluated these images and lab results as part of my medical decision-making.   EKG Interpretation None      Results for orders placed or performed during the hospital encounter of 07/11/15  Comprehensive metabolic panel  Result Value Ref Range   Sodium 139 135 - 145 mmol/L   Potassium 3.8 3.5 - 5.1 mmol/L   Chloride 108 101 - 111 mmol/L   CO2 23 22 - 32 mmol/L   Glucose, Bld 113 (H) 65 - 99 mg/dL   BUN 16 6 - 20 mg/dL   Creatinine, Ser 1.10 0.61 - 1.24 mg/dL   Calcium 9.1 8.9 - 10.3 mg/dL   Total Protein 7.8 6.5 - 8.1 g/dL   Albumin 4.0 3.5 - 5.0 g/dL   AST 23 15 - 41 U/L   ALT 20 17 - 63 U/L   Alkaline Phosphatase 62 38 - 126 U/L   Total Bilirubin 0.6 0.3 - 1.2 mg/dL   GFR calc non Af Amer >60 >60 mL/min   GFR calc Af Amer >60 >60 mL/min   Anion gap 8 5 - 15  CBC  Result Value Ref Range   WBC 5.8 4.0 - 10.5 K/uL   RBC 4.84 4.22 - 5.81 MIL/uL   Hemoglobin 14.4 13.0 - 17.0 g/dL   HCT 45.4 39.0 - 52.0 %   MCV 93.8 78.0 - 100.0 fL   MCH 29.8 26.0 - 34.0 pg   MCHC 31.7 30.0 - 36.0 g/dL    RDW 14.3 11.5 - 15.5 %   Platelets 203 150 - 400 K/uL  POC occult blood, ED  Result Value Ref Range   Fecal Occult Bld POSITIVE (A) NEGATIVE  Type and screen [place]  Result Value Ref Range   ABO/RH(D) O POS    Antibody Screen NEG    Sample Expiration 07/14/2015   ABO/Rh  Result Value Ref Range   ABO/RH(D) O POS    No results found.   MDM  Dr. Doyle Askew consulted and arranged for inpatient stay [facility] gastroenterology , Dr Carlean Purl consulted to see pt while in hospital as will likely need colonoscopy/endoscopy Final diagnoses:  None  Dx rectal bleeding      Orlie Dakin, MD 07/11/15 [place], MD 07/11/15 1815

## 2015-07-12 DIAGNOSIS — K648 Other hemorrhoids: Secondary | ICD-10-CM | POA: Diagnosis not present

## 2015-07-12 DIAGNOSIS — K51519 Left sided colitis with unspecified complications: Secondary | ICD-10-CM

## 2015-07-12 DIAGNOSIS — K625 Hemorrhage of anus and rectum: Secondary | ICD-10-CM | POA: Diagnosis not present

## 2015-07-12 DIAGNOSIS — R42 Dizziness and giddiness: Secondary | ICD-10-CM | POA: Diagnosis not present

## 2015-07-12 DIAGNOSIS — K5150 Left sided colitis without complications: Secondary | ICD-10-CM | POA: Insufficient documentation

## 2015-07-12 LAB — BASIC METABOLIC PANEL
Anion gap: 9 (ref 5–15)
BUN: 14 mg/dL (ref 6–20)
CHLORIDE: 109 mmol/L (ref 101–111)
CO2: 25 mmol/L (ref 22–32)
CREATININE: 1.15 mg/dL (ref 0.61–1.24)
Calcium: 8.8 mg/dL — ABNORMAL LOW (ref 8.9–10.3)
GFR calc Af Amer: >60 mL/min (ref >60)
GFR calc non Af Amer: >60 mL/min (ref >60)
Glucose, Bld: 110 mg/dL — ABNORMAL HIGH (ref 65–99)
Potassium: 4.1 mmol/L (ref 3.5–5.1)
Sodium: 143 mmol/L (ref 135–145)

## 2015-07-12 LAB — CBC
HCT: 45.7 % (ref 39.0–52.0)
Hemoglobin: 14.7 g/dL (ref 13.0–17.0)
MCH: 30.4 pg (ref 26.0–34.0)
MCHC: 32.2 g/dL (ref 30.0–36.0)
MCV: 94.6 fL (ref 78.0–100.0)
PLATELETS: 211 10*3/uL (ref 150–400)
RBC: 4.83 MIL/uL (ref 4.22–5.81)
RDW: 14.6 % (ref 11.5–15.5)
WBC: 6.9 10*3/uL (ref 4.0–10.5)

## 2015-07-12 MED ORDER — HYDROCORTISONE ACETATE 25 MG RE SUPP: 25.0000 mg | Freq: Two times a day (BID) | RECTAL | Status: DC

## 2015-07-12 NOTE — ED Notes (Signed)
Patient states he does not wish to be admitted.  Spoke to Dr. Francene Boyers and she stated a GI, PA would come by to speak with patient.

## 2015-07-12 NOTE — Discharge Summary (Signed)
Physician Discharge Summary  Bill Fox [id number] DOB: March 28, 1941 DOA: 07/11/2015  PCP: [name], MD  Admit date: 07/11/2015 Discharge date: 07/12/2015  Time spent: 50 minutes    Discharge Condition: Stable    Discharge Diagnoses:  Principal Problem:   RECTAL BLEEDING Active Problems:   Dizziness   Morbid obesity ([place])   Essential hypertension   History of present illness:  75 year old male who has a past medical history of ulcerative colitis, hypertension, hyperlipidemia presents to the ER for dizziness and an episode of bright red blood per rectum. He states that he has not had any abdominal pain nausea or vomiting. His dizziness occurred all day 2 days ago and the following morning was when he noticed the blood that was mixed with stool. He continued to be intermittently dizzy during the day and therefore came to the hospital.  Hospital Course:  GI bleed -He had a very small amount of stool today with a slight amount of blood -He has been evaluated by GI and has been started on Anusol suppository for possible internal hemorrhoids- GI is recommending that he be discharged home today - Hemoglobin has not dropped -Underwent a colonoscopy in the fall of last year which showed chronic scarring and otherwise normal mucosa and therefore another colonoscopy has not been repeated at this time  Dizziness -Cause uncertain-orthostatic vitals are negative- I have asked the staff to ambulate the patient and after significant amount of ambulation, he notes that symptoms have resolved completely  History of ulcerative colitis -According to patient, his symptoms have been under control-continue mercaptopurine  Procedures:  none  Consultations:  GI  Discharge Exam: There were no vitals filed for this visit. Filed Vitals:   07/12/15 1041 07/12/15 1200  BP: 126/77 129/88  Pulse: 69 73  Temp:    Resp: 18 19    General: AAO x 3, no distress Cardiovascular: RRR, no  murmurs  Respiratory: clear to auscultation bilaterally GI: soft, non-tender, non-distended, bowel sound positive  Discharge Instructions You were cared for by a hospitalist during your hospital stay. If you have any questions about your discharge medications or the care you received while you were in the hospital after you are discharged, you can call the unit and asked to speak with the hospitalist on call if the hospitalist that took care of you is not available. Once you are discharged, your primary care physician will handle any further medical issues. Please note that NO REFILLS for any discharge medications will be authorized once you are discharged, as it is imperative that you return to your primary care physician (or establish a relationship with a primary care physician if you do not have one) for your aftercare needs so that they can reassess your need for medications and monitor your lab values.      Discharge Instructions    Diet - low sodium heart healthy    Complete by:  As directed      Increase activity slowly    Complete by:  As directed             Medication List    TAKE these medications        DELZICOL 400 MG Cpdr DR capsule  Generic drug:  Mesalamine  TAKE 4 CAPSULES THREE TIMES A DAY (NEEDS OFFICE VISIT FOR FURTHER REFILLS)     doxazosin 8 MG tablet  Commonly known as:  CARDURA  TAKE ONE-HALF (1/2) TABLET AT BEDTIME (MUST HAVE OFFICE VISIT FOR FURTHER REFILLS)  hydrocortisone 25 MG suppository  Commonly known as:  ANUSOL-HC  Place 1 suppository (25 mg total) rectally 2 (two) times daily.     mercaptopurine 50 MG tablet  Commonly known as:  PURINETHOL  TAKE AS INSTRUCTED BY YOUR PRESCRIBER (NEEDS OFFICE VISIT FOR FURTHER REFILLS)     mirtazapine 30 MG tablet  Commonly known as:  REMERON  TAKE 1 TABLET AT BEDTIME (MUST MAKE APPOINTMENT BEFORE ANY FURTHER REFILLS)     naproxen sodium 220 MG tablet  Commonly known as:  ANAPROX  Take 440 mg by mouth  2 (two) times daily as needed (pain).       No Known Allergies    The results of significant diagnostics from this hospitalization (including imaging, microbiology, ancillary and laboratory) are listed below for reference.    Significant Diagnostic Studies: No results found.  Microbiology: No results found for this or any previous visit (from the past 240 hour(s)).   Labs: Basic Metabolic Panel:  Recent Labs Lab 07/11/15 1320 07/12/15 0614  NA 139 143  K 3.8 4.1  CL 108 109  CO2 23 25  GLUCOSE 113* 110*  BUN 16 14  CREATININE 1.10 1.15  CALCIUM 9.1 8.8*   Liver Function Tests:  Recent Labs Lab 07/11/15 1320  AST 23  ALT 20  ALKPHOS 62  BILITOT 0.6  PROT 7.8  ALBUMIN 4.0   No results for input(s): LIPASE, AMYLASE in the last 168 hours. No results for input(s): AMMONIA in the last 168 hours. CBC:  Recent Labs Lab 07/11/15 1320 07/12/15 0614  WBC 5.8 6.9  HGB 14.4 14.7  HCT 45.4 45.7  MCV 93.8 94.6  PLT 203 211   Cardiac Enzymes: No results for input(s): CKTOTAL, CKMB, CKMBINDEX, TROPONINI in the last 168 hours. BNP: BNP (last 3 results) No results for input(s): BNP in the last 8760 hours.  ProBNP (last 3 results) No results for input(s): PROBNP in the last 8760 hours.  CBG: No results for input(s): GLUCAP in the last 168 hours.     SignedDebbe Odea, MD [facility] 07/12/2015, 5:34 PM

## 2015-07-12 NOTE — ED Notes (Signed)
GI, PA at bedside.

## 2015-07-12 NOTE — Consult Note (Signed)
Referring Provider: No ref. provider found Primary Care Physician:  [name], MD Primary Gastroenterologist:  Dr. Henrene Pastor  Reason for Consultation:  Rectal  HPI: Bill Fox is a 75 y.o. male with left sided ulcerative colitis who follows with Dr. Henrene Pastor.  He presented to [facility] hospital on 2/21 at the request of our office for complaints of rectal bleeding and dizziness.  Patient tells me that he often sees flecks of blood in his stool at home, but yesterday AM he had a BM with bright red blood more than what he is used to seeing.  Says that he's had blood like this in the past but it has been a while.  He denies abdominal pain and diarrhea.  Takes 6MP 150 mg daily and Delzicol 4.8 grams daily for his UC.  Patient has been completely hemodynamically stable.  Hgb is normal at 14.7 grams.  He has been up and walking around.  No further dizziness.  Had a harder type stool with smaller amount of blood.  Colonoscopy 01/2015 by Dr. Henrene Pastor was normal with some scarring in the left colon and rectosigmoid colon but no active colitis.  Multiple biopsies were taken at the time with no evidence of malignancy.   Past Medical History  Diagnosis Date  . Ulcerative colitis   . Benign positional vertigo   . Hypertension   . Anxiety   . Hyperlipidemia     pt denies ever having high chol  . History of pulmonary embolism   . History of colon polyps     2011  &  2013 --  ADENOMATOUS  . Right knee meniscal tear   . DJD (degenerative joint disease)     RIGHT KNEE  . Chronic fatigue   . At risk for sleep apnea     STOP-BANG= 5     SENT TO PCP 11-29-2013  . Wears glasses     Past Surgical History  Procedure Laterality Date  . Nasal fracture surgery  1985  . Colonoscopy w/ polypectomy  MULTIPLE--  LAST ONE 09-12-2011  . Transthoracic echocardiogram  11-01-2008    MILD LVH/  EF 46%/  GRADE I DIASTOLIC DYSFUNCTION/  MILD LAE  . Cataract extraction w/ intraocular lens implant Left 2014  . Knee  arthroscopy with drilling/microfracture Right 12/03/2013    Procedure: RIGHT KNEE ARTHROSCOPY PARTIAL MEDIAL MENISCECTOMY DEBRIDEMENT SYNOVECTOMY;  Surgeon: Johnn Hai, MD;  Location: [place];  Service: Orthopedics;  Laterality: Right;    Prior to Admission medications   Medication Sig Start Date End Date Taking? Authorizing Provider  DELZICOL 400 MG CPDR DR capsule TAKE 4 CAPSULES THREE TIMES A DAY (NEEDS OFFICE VISIT FOR FURTHER REFILLS) 11/25/14  Yes Irene Shipper, MD  doxazosin (CARDURA) 8 MG tablet TAKE ONE-HALF (1/2) TABLET AT BEDTIME (MUST HAVE OFFICE VISIT FOR FURTHER REFILLS) 05/04/15  Yes Tanda Rockers, MD  mercaptopurine (PURINETHOL) 50 MG tablet TAKE AS INSTRUCTED BY YOUR PRESCRIBER (NEEDS OFFICE VISIT FOR FURTHER REFILLS) Patient taking differently: take 157m daily 04/24/15  Yes JIrene Shipper MD  mirtazapine (REMERON) 30 MG tablet TAKE 1 TABLET AT BEDTIME (MUST MAKE APPOINTMENT BEFORE ANY FURTHER REFILLS) 02/20/15  Yes MTanda Rockers MD  naproxen sodium (ANAPROX) 220 MG tablet Take 440 mg by mouth 2 (two) times daily as needed (pain).   Yes Historical Provider, MD    Current Facility-Administered Medications  Medication Dose Route Frequency Provider Last Rate Last Dose  . Mesalamine (ASACOL) DR capsule 800  mg  800 mg Oral TID Theodis Blaze, MD   800 mg at 07/11/15 2121  . mirtazapine (REMERON) tablet 30 mg  30 mg Oral QHS Theodis Blaze, MD   30 mg at 07/11/15 2121  . ondansetron (ZOFRAN) tablet 4 mg  4 mg Oral Q6H PRN Theodis Blaze, MD       Or  . ondansetron [facility]) injection 4 mg  4 mg Intravenous Q6H PRN Theodis Blaze, MD      . sodium chloride flush (NS) 0.9 % injection 3 mL  3 mL Intravenous Q12H Theodis Blaze, MD   3 mL at 07/12/15 5462   Current Outpatient Prescriptions  Medication Sig Dispense Refill  . DELZICOL 400 MG CPDR DR capsule TAKE 4 CAPSULES THREE TIMES A DAY (NEEDS OFFICE VISIT FOR FURTHER REFILLS) 1080 capsule 3  . doxazosin (CARDURA) 8  MG tablet TAKE ONE-HALF (1/2) TABLET AT BEDTIME (MUST HAVE OFFICE VISIT FOR FURTHER REFILLS) 45 tablet 1  . mercaptopurine (PURINETHOL) 50 MG tablet TAKE AS INSTRUCTED BY YOUR PRESCRIBER (NEEDS OFFICE VISIT FOR FURTHER REFILLS) (Patient taking differently: take 140m daily) 270 tablet 0  . mirtazapine (REMERON) 30 MG tablet TAKE 1 TABLET AT BEDTIME (MUST MAKE APPOINTMENT BEFORE ANY FURTHER REFILLS) 90 tablet 3  . naproxen sodium (ANAPROX) 220 MG tablet Take 440 mg by mouth 2 (two) times daily as needed (pain).      Allergies as of 07/11/2015  . (No Known Allergies)    Family History  Problem Relation Age of Onset  . Diabetes Mother   . Cancer Father     unknown type  . Stomach cancer Father   . Lung cancer Brother   . Colon cancer Neg Hx   . Diabetes Paternal Uncle     x 2    Social History   Social History  . Marital Status: Married    Spouse Name: N/A  . Number of Children: N/A  . Years of Education: N/A   Occupational History  . tArmed forces operational officer   Social History Main Topics  . Smoking status: Former Smoker -- 1.00 packs/day for 30 years    Types: Cigarettes    Quit date: 05/20/1993  . Smokeless tobacco: Never Used  . Alcohol Use: 0.6 oz/week    1 Cans of beer per week     Comment: beer on occ  . Drug Use: No  . Sexual Activity: Not on file   Other Topics Concern  . Not on file   Social History Narrative    Review of Systems: Ten point ROS is O/W negative except as mentioned in HPI.  Physical Exam: Vital signs in last 24 hours: Temp:  [98.5 F (36.9 C)] 98.5 F (36.9 C) (02/21 1246) Pulse Rate:  [60-95] 70 (02/22 0824) Resp:  [15-23] 18 (02/22 0824) BP: (124-151)/(64-115) 136/77 mmHg (02/22 0824) SpO2:  [90 %-96 %] 95 % (02/22 0824)   General:  Alert, Well-developed, well-nourished, pleasant and cooperative in NAD Head:  Normocephalic and atraumatic. Eyes:  Sclera clear, no icterus.  Conjunctiva pink. Ears:  Normal auditory acuity. Mouth:  No  deformity or lesions.   Lungs:  Clear throughout to auscultation.  No wheezes, crackles, or rhonchi.  Heart:  Regular rate and rhythm; no murmurs, clicks, rubs, or gallops. Abdomen:  Soft, non-distended.  Normal bowel sounds.  Non-tender.  Rectal:  Has some skin maceration in the gluteal cleft with erythema.  Non-tender.  No external hemorrhoids noted.  DRE did  not reveal any masses, no blood on exam glove. Msk:  Symmetrical without gross deformities. Pulses:  Normal pulses noted. Extremities:  Without clubbing or edema. Neurologic:  Alert and oriented x 4;  grossly normal neurologically. Skin:  Intact without significant lesions or rashes. Psych:  Alert and cooperative. Normal mood and affect.  Lab Results:  Recent Labs  07/11/15 1320 07/12/15 0614  WBC 5.8 6.9  HGB 14.4 14.7  HCT 45.4 45.7  PLT 203 211   BMET  Recent Labs  07/11/15 1320 07/12/15 0614  NA 139 143  K 3.8 4.1  CL 108 109  CO2 23 25  GLUCOSE 113* 110*  BUN 16 14  CREATININE 1.10 1.15  CALCIUM 9.1 8.8*   LFT  Recent Labs  07/11/15 1320  PROT 7.8  ALBUMIN 4.0  AST 23  ALT 20  ALKPHOS 43  BILITOT 0.6   IMPRESSION:  -Rectal bleeding in a patient with known left sided UC:  Recent colonoscopy within the past 6 months shows that his disease was in remission on medication regimen.  He's had minimal bleeding since his admission, Hgb is completely normal, and vital signs stable.  This could have been bleeding internal hemorrhoids vs a mild flare of some colitis/proctitis.  PLAN: -[place] for discharge from GI standpoint.  Will send him home with anusol suppositories BID for 7 days.  May need Miralax daily to help keep stools soft and prevent straining.  Our office will call him within the next few days with an appt for follow-up.  Catriona Dillenbeck D.  07/12/2015, 8:39 AM  Pager number [phone]

## 2015-07-12 NOTE — ED Notes (Signed)
Pt can go up at 13:27

## 2015-07-12 NOTE — Progress Notes (Signed)
ED CM spoke with ED RN - GI PA to see pt, pt not wanting admission - pending Pt has walked, had stool with noted blood, pt informed staff he generally has some blood, no blood has been infused

## 2015-07-14 ENCOUNTER — Telehealth: Payer: Self-pay

## 2015-07-14 NOTE — Telephone Encounter (Signed)
Pt scheduled to see Amy Esterwood PA 07/24/15@1 :30pm. Left message for pt to call back.

## 2015-07-14 NOTE — Telephone Encounter (Signed)
Pt aware of appt.

## 2015-07-14 NOTE — Telephone Encounter (Signed)
-----   Message from Algernon Huxley, RN sent at 07/12/2015  4:00 PM EST ----- Regarding: [place] with midlevel-call  pt

## 2015-07-17 ENCOUNTER — Ambulatory Visit: Payer: Medicare Other | Admitting: Physician Assistant

## 2015-07-24 ENCOUNTER — Other Ambulatory Visit: Payer: Self-pay | Admitting: Internal Medicine

## 2015-07-24 ENCOUNTER — Ambulatory Visit: Payer: Medicare Other | Admitting: Physician Assistant

## 2015-07-31 ENCOUNTER — Encounter: Payer: Self-pay | Admitting: Physician Assistant

## 2015-07-31 ENCOUNTER — Ambulatory Visit (INDEPENDENT_AMBULATORY_CARE_PROVIDER_SITE_OTHER): Payer: Medicare Other | Admitting: Physician Assistant

## 2015-07-31 ENCOUNTER — Other Ambulatory Visit (INDEPENDENT_AMBULATORY_CARE_PROVIDER_SITE_OTHER): Payer: Medicare Other

## 2015-07-31 VITALS — BP 136/78 | HR 100 | Ht 70.5 in | Wt 278.0 lb

## 2015-07-31 DIAGNOSIS — K648 Other hemorrhoids: Secondary | ICD-10-CM | POA: Diagnosis not present

## 2015-07-31 DIAGNOSIS — K5190 Ulcerative colitis, unspecified, without complications: Secondary | ICD-10-CM

## 2015-07-31 LAB — CBC WITH DIFFERENTIAL/PLATELET
BASOS PCT: 0.5 % (ref 0.0–3.0)
Basophils Absolute: 0 10*3/uL (ref 0.0–0.1)
EOS ABS: 0.1 10*3/uL (ref 0.0–0.7)
EOS PCT: 1.4 % (ref 0.0–5.0)
HEMATOCRIT: 44.8 % (ref 39.0–52.0)
Hemoglobin: 15.1 g/dL (ref 13.0–17.0)
LYMPHS PCT: 17.5 % (ref 12.0–46.0)
Lymphs Abs: 1.4 10*3/uL (ref 0.7–4.0)
MCHC: 33.7 g/dL (ref 30.0–36.0)
MCV: 90.4 fl (ref 78.0–100.0)
Monocytes Absolute: 0.8 10*3/uL (ref 0.1–1.0)
Monocytes Relative: 9.9 % (ref 3.0–12.0)
NEUTROS ABS: 5.6 10*3/uL (ref 1.4–7.7)
Neutrophils Relative %: 70.7 % (ref 43.0–77.0)
PLATELETS: 227 10*3/uL (ref 150.0–400.0)
RBC: 4.95 Mil/uL (ref 4.22–5.81)
RDW: 14.1 % (ref 11.5–15.5)
WBC: 8 10*3/uL (ref 4.0–10.5)

## 2015-07-31 NOTE — Progress Notes (Signed)
Patient ID: Bill Fox, male   DOB: [birth year], 75 y.o.   MRN: [id number]   Subjective:    Patient ID: Bill Fox, male    DOB: 12-10-1940, 75 y.o.   MRN: [id number]  HPI  Bill Fox is a very nice 75 year old white male known to Dr. Henrene Pastor with history of left-sided ulcerative colitis. He comes in today for post hospital follow-up. He was recently admitted to 20 through 07/12/2015 after he presented to the emergency room with complaints of rectal bleeding and some dizziness. His hemoglobin was 14.7 and he did not manifest any active bleeding during the time he was in the hospital. He was seen by GI and was felt his bleeding was likely secondary to internal hemorrhoids. He has been using Anusol HC suppositories at bedtime since. He says he has not had any bleeding since discharge from the hospital. We seen small flecks of blood off and on in his stools for the past several years and that has not changed. He also says he intermittently have will have some burning after bowel movements and feels that that may be related to mercaptopurine. He has no complaints of rectal discomfort no complaints of abdominal discomfort cramping etc. and says his bowel movements have been normal.  Most  recent colonoscopy done September 2016 for follow-up of adenomatous polyps showed no evidence of polyps he had some scarring in the left colon, but no active colitis and was noted to have internal hemorrhoids. Biopsy showed benign mucosa no active colitis but some features consistent with quiescent UC He has been maintained on 6-MP 150 mg by mouth daily and  Delzicol  4 tablets 3 times a day.  Review of Systems Pertinent positive and negative review of systems were noted in the above HPI section.  All other review of systems was otherwise negative.  Outpatient Encounter Prescriptions as of 07/31/2015  Medication Sig  . DELZICOL 400 MG CPDR DR capsule TAKE 4 CAPSULES THREE TIMES A DAY (NEEDS OFFICE VISIT FOR FURTHER  REFILLS)  . doxazosin (CARDURA) 8 MG tablet TAKE ONE-HALF (1/2) TABLET AT BEDTIME (MUST HAVE OFFICE VISIT FOR FURTHER REFILLS)  . hydrocortisone (ANUSOL-HC) 25 MG suppository Place 1 suppository (25 mg total) rectally 2 (two) times daily.  . mercaptopurine (PURINETHOL) 50 MG tablet TAKE 3 TABLETS DAILY ON AN EMPTY STOMACH 1 HOUR BEFORE OR 2 HOURS AFTER A MEAL (NEED OFFICE VISIT FOR FURTHER REFILLS)  . mirtazapine (REMERON) 30 MG tablet TAKE 1 TABLET AT BEDTIME (MUST MAKE APPOINTMENT BEFORE ANY FURTHER REFILLS)  . [DISCONTINUED] naproxen sodium (ANAPROX) 220 MG tablet Take 440 mg by mouth 2 (two) times daily as needed (pain).   No facility-administered encounter medications on file as of 07/31/2015.   No Known Allergies Patient Active Problem List   Diagnosis Date Noted  . Dizziness 07/12/2015  . Left sided colitis ([place])   . Hypothyroidism 05/01/2015  . Solitary pulmonary nodule 02/01/2015  . Cerumen impaction 03/03/2014  . Leg swelling 06/10/2013  . Knee pain, right 06/10/2013  . Scotoma 09/10/2012  . Seborrheic keratosis 09/03/2011  . Mechanical back pain 11/07/2010  . MICROSCOPIC HEMATURIA 08/01/2009  . ANAL FISSURE 07/17/2009  . INSOMNIA, TRANSIENT 06/27/2009  . HEADACHE 02/16/2009  . FATIQUE AND MALAISE 10/21/2008  . PULMONARY EMBOLISM AND INFARCTION 07/20/2008  . Other iron deficiency anemias 07/02/2007  . RECTAL BLEEDING 07/02/2007  . Hyperlipidemia LDL goal <130 07/01/2007  . Morbid obesity ([place]) 07/01/2007  . Essential hypertension 07/01/2007  . BENIGN POSITIONAL VERTIGO, HX  OF 07/01/2007  . COLONIC POLYPS, HX OF 07/01/2007   Social History   Social History  . Marital Status: Married    Spouse Name: N/A  . Number of Children: N/A  . Years of Education: N/A   Occupational History  . Armed forces operational officer    Social History Main Topics  . Smoking status: Former Smoker -- 1.00 packs/day for 30 years    Types: Cigarettes    Quit date: 05/20/1993  . Smokeless tobacco:  Never Used  . Alcohol Use: 0.6 oz/week    1 Cans of beer per week     Comment: beer on occ  . Drug Use: No  . Sexual Activity: Not on file   Other Topics Concern  . Not on file   Social History Narrative    [name] family history includes Cancer in his father; Diabetes in his mother and paternal uncle; Lung cancer in his brother; Stomach cancer in his father. There is no history of Colon cancer.      Objective:    Filed Vitals:   07/31/15 1421  BP: 136/78  Pulse: 100    Physical Exam    well-developed older white male in no acute distress, very pleasant blood pressure 136/78 pulse 100 height 5 foot 10 weight 278 8 HEENT nontraumatic, cephalic EOMI PERRLA sclera anicteric, not further examined today discussion only     Assessment & Plan:   #1 75 yo male with left sided ulcerative colitis - quiescent at recent Colonoscopy 01/2015- with recent brief hospital stay with c/o rectal bleeding- hgb normal and bleeding felt secondary to internal hemorrhoids No active bleeding since discharge, and feels fine #2 History of adenomatous colon polyps #3 morbid obesity #4 hypertension  Plan; Will check follow-up CBC today He may stop Anusol suppositories and use when necessary for recurrence Trial of recticare/4% lidocaine when necessary for anal burning  Continue 6-MP 150 mg by mouth daily Continue daily Asacol 400 mg 4 capsules 3 times daily Follow-up with Dr. Henrene Pastor in one year or sooner if needed He will be due for follow-up colonoscopy in September 2019   Amy S Esterwood PA-C 07/31/2015   Cc: Tanda Rockers, MD

## 2015-07-31 NOTE — Patient Instructions (Addendum)
Please go to the basement level to have your labs drawn.  Continue same medications, Delzicol, Mercaptipurine ( 6MP).  You may stop the Anusol -HC.  There is an over the counter cream you can get that has lidocaine in it.  The name is " Recticare".  You can get this at [place], [place], [place], and [employer].

## 2015-08-02 DIAGNOSIS — M25562 Pain in left knee: Secondary | ICD-10-CM | POA: Diagnosis not present

## 2015-08-18 DIAGNOSIS — S83232D Complex tear of medial meniscus, current injury, left knee, subsequent encounter: Secondary | ICD-10-CM | POA: Diagnosis not present

## 2015-08-18 DIAGNOSIS — M1712 Unilateral primary osteoarthritis, left knee: Secondary | ICD-10-CM | POA: Diagnosis not present

## 2015-08-31 ENCOUNTER — Ambulatory Visit: Payer: Medicare Other | Admitting: Internal Medicine

## 2015-09-18 ENCOUNTER — Encounter: Payer: Self-pay | Admitting: Internal Medicine

## 2015-09-18 ENCOUNTER — Ambulatory Visit (INDEPENDENT_AMBULATORY_CARE_PROVIDER_SITE_OTHER): Payer: Medicare Other | Admitting: Internal Medicine

## 2015-09-18 VITALS — BP 126/80 | HR 91 | Ht 72.0 in | Wt 277.0 lb

## 2015-09-18 DIAGNOSIS — R911 Solitary pulmonary nodule: Secondary | ICD-10-CM | POA: Diagnosis not present

## 2015-09-18 DIAGNOSIS — E038 Other specified hypothyroidism: Secondary | ICD-10-CM

## 2015-09-18 DIAGNOSIS — K625 Hemorrhage of anus and rectum: Secondary | ICD-10-CM

## 2015-09-18 DIAGNOSIS — I10 Essential (primary) hypertension: Secondary | ICD-10-CM

## 2015-09-18 MED ORDER — MELOXICAM 7.5 MG PO TABS: ORAL_TABLET | ORAL | Status: DC

## 2015-09-18 NOTE — Assessment & Plan Note (Signed)
hgb ok but can't use aleve > rec trial of mobic and f/u with Dr Tonita Cong re use of steroids/ timing of Knee surgery  I had an extended discussion with the patient reviewing all relevant studies completed to date and  lasting 15 to 20 minutes of a 25 minute visit    Each maintenance medication was reviewed in detail including most importantly the difference between maintenance and prns and under what circumstances the prns are to be triggered using an action plan format that is not reflected in the computer generated alphabetically organized AVS.    Please see instructions for details which were reviewed in writing and the patient given a copy highlighting the part that I personally wrote and discussed at today's ov.

## 2015-09-18 NOTE — Progress Notes (Signed)
Subjective:    Patient ID: [name], male    DOB: 1941-04-14 .   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     History of Present Illness  05/01/2015 Follow up : HTN , Hyperlipidemia , Obesity  Patient returns for a three-month follow-up He says overall that he has been doing okay. Did have a fall 2 months ago with left knee injury. He has been followed up with his orthopedist. chest x-ray last visit showed a possible lung nodule. Subsequent CT chest showed no nodule is stable granulomatous disease.  labs. Last visit showed slightly elevated TSH around 5 . Discussed getting a repeat today   Patient denies any chest pain, orthopnea, PND or leg swelling rec No change rx  Recheck tsh > nl    09/18/2015  f/u ov/Jhon Mallozzi re:  Hbp/ obesity/ djd/ UC with gib on aleve / f/u ? SPN Chief Complaint  Patient presents with  . Follow-up    Pt c/o left knee pain- seeing Dr Maxie Better at [place]. He states he may have to eventually have TKR. He states that he has been taking aleve and has noticed some bloody stools off and on.   Acute left knee strain Jan 2017 and started taking aleve but variable rectal bleeding since then and wants to consider prednisone but hasn't discussed yet with Dr Tonita Cong  No obvious day to day or daytime variability or assoc excess/ purulent sputum or mucus plugs or hemoptysis or cp or chest tightness, subjective wheeze or overt sinus or hb symptoms. No unusual exp hx or h/o childhood pna/ asthma or knowledge of premature birth.  Sleeping ok without nocturnal  or early am exacerbation  of respiratory  c/o's or need for noct saba. Also denies any obvious fluctuation of symptoms with weather or environmental changes or other aggravating or alleviating factors except as  outlined above   Current Medications, Allergies, Complete Past Medical History, Past Surgical History, Family History, and Social History were reviewed in [employer] record.  ROS  The following are not active complaints unless bolded sore throat, dysphagia, dental problems, itching, sneezing,  nasal congestion or excess/ purulent secretions, ear ache,   fever, chills, sweats, unintended wt loss, classically pleuritic or exertional cp,  orthopnea pnd or leg swelling, presyncope, palpitations, abdominal pain, anorexia, nausea, vomiting, diarrhea  or change in bowel or bladder habits, change in stools or urine, dysuria,hematuria,  rash, arthralgias, visual complaints, headache, numbness, weakness or ataxia or problems with walking or coordination,  change in mood/affect or memory.                         Past Medical History:  ULCERATIVE COLITIS...................................................................................Marland KitchenHenrene Pastor / [facility] [place] + COLONIC POLYPS, HX OF (ICD-V12.72)  - colonoscopy 07/24/2007  - colonoscopy 06/08/09: mucosal changes consistent with moderately severe  left-sided ulcerative colitis involving the rectum to distal transverse colon confluent  HYPERLIPIDEMIA (ICD-272.4)  - target LDL < 130 (pos HBP, male, former smoker)  MORBID OBESITY (ICD-278.01)  - ideal = 168 target 208  - Did not keep nutrition appt 07/04/09  BENIGN POSITIONAL VERTIGO, HX OF (ICD-V12.49)  HYPERTENSION (ICD-401.9)  Microsocopic Hematuria, neg w/u 1999, resent to Urology August 03, 2009 >>> negative  PULMONARY EMBOLISM 07/06/2008  - Left DVT by venous doppler 07/07/2008 (asymptomatic) > neg venous doppler 11/01/08  - nl  ECHO 07/08/08  DJD R knee pain .................................................................................          [place]  HEALTH MAINTENANCE.......................................................................        Jayton Popelka  - Pneumovax 06/20/2008  -   Prevnar 13 01/30/2015  - Td 09/03/2011  - CPX  01/30/2015  coronary calcifications ? significance 08/2009         Objective:   Physical Exam    amb somber wm   Wt Readings from Last 3 Encounters:  09/18/15 277 lb (125.646 kg)  07/31/15 278 lb (126.1 kg)  05/01/15 275 lb (124.739 kg)    Vital signs reviewed    HEENT:3 remaining upper teeth poor condition/ nl  turbinates, and orophanx. EACS clear    NECK :  without JVD/Nodes/TM/ nl carotid upstrokes bilaterally   LUNGS: no acc muscle use, clear to A and P bilaterally without cough on insp or exp maneuvers   CV:  RRR  no s3 or murmur or increase in P2, no edema   ABD:  soft and nontender with nl excursion in the supine position. No bruits or organomegaly, bowel sounds nl  MS:  warm without deformities, calf tenderness, cyanosis or clubbing/ crepitance L Knee slt warm no def effusion/ good rom   SKIN: warm and dry with multiple seb keratosis lesions    NEURO:  alert, approp, no deficits         Lab Results  Component Value Date   HGB 15.1 07/31/2015   HGB 14.7 07/12/2015   HGB 14.4 07/11/2015        Assessment & Plan:

## 2015-09-18 NOTE — Assessment & Plan Note (Signed)
Adequate control on present rx, reviewed > no change in rx needed   

## 2015-09-18 NOTE — Assessment & Plan Note (Signed)
First seen L hilum 01/30/2015 > CT chest 02/06/15 > no nodule> no f/u needed

## 2015-09-18 NOTE — Patient Instructions (Signed)
Try meloxicam 7.5 mg twice daily as needed for joint pain   Please schedule a follow up visit in 3 months but call sooner if needed

## 2015-09-18 NOTE — Assessment & Plan Note (Signed)
Repeat tsh wnl > no rx needed

## 2015-09-18 NOTE — Assessment & Plan Note (Signed)
ideal = 168 target 208 for BMI < 30  Body mass index is 37.56    Lab Results  Component Value Date   TSH 2.63 05/01/2015     Contributing to gerd tendency/ doe/reviewed the need and the process to achieve and maintain neg calorie balance

## 2015-10-31 ENCOUNTER — Other Ambulatory Visit: Payer: Self-pay | Admitting: Internal Medicine

## 2015-10-31 DIAGNOSIS — S90861A Insect bite (nonvenomous), right foot, initial encounter: Secondary | ICD-10-CM | POA: Diagnosis not present

## 2015-12-25 ENCOUNTER — Other Ambulatory Visit (INDEPENDENT_AMBULATORY_CARE_PROVIDER_SITE_OTHER): Payer: Medicare Other

## 2015-12-25 ENCOUNTER — Ambulatory Visit (INDEPENDENT_AMBULATORY_CARE_PROVIDER_SITE_OTHER)
Admission: RE | Admit: 2015-12-25 | Discharge: 2015-12-25 | Disposition: A | Discharge status: Home / Self Care | Payer: Medicare Other | Source: Ambulatory Visit | Attending: Internal Medicine | Admitting: Internal Medicine

## 2015-12-25 ENCOUNTER — Encounter: Payer: Self-pay | Admitting: Internal Medicine

## 2015-12-25 ENCOUNTER — Telehealth: Payer: Self-pay | Admitting: Internal Medicine

## 2015-12-25 ENCOUNTER — Ambulatory Visit (INDEPENDENT_AMBULATORY_CARE_PROVIDER_SITE_OTHER): Payer: Medicare Other | Admitting: Internal Medicine

## 2015-12-25 VITALS — BP 132/86 | HR 100 | Ht 72.0 in | Wt 273.6 lb

## 2015-12-25 DIAGNOSIS — E785 Hyperlipidemia, unspecified: Secondary | ICD-10-CM

## 2015-12-25 DIAGNOSIS — K625 Hemorrhage of anus and rectum: Secondary | ICD-10-CM

## 2015-12-25 DIAGNOSIS — I10 Essential (primary) hypertension: Secondary | ICD-10-CM

## 2015-12-25 DIAGNOSIS — M25561 Pain in right knee: Secondary | ICD-10-CM | POA: Diagnosis not present

## 2015-12-25 LAB — URINALYSIS, ROUTINE W REFLEX MICROSCOPIC
BILIRUBIN URINE: NEGATIVE
KETONES UR: NEGATIVE
Leukocytes, UA: NEGATIVE
NITRITE: NEGATIVE
PH: 5.5 (ref 5.0–8.0)
Specific Gravity, Urine: 1.02 (ref 1.000–1.030)
Total Protein, Urine: NEGATIVE
Urine Glucose: NEGATIVE
Urobilinogen, UA: 0.2 (ref 0.0–1.0)

## 2015-12-25 LAB — CBC WITH DIFFERENTIAL/PLATELET
BASOS PCT: 0.5 % (ref 0.0–3.0)
Basophils Absolute: 0 10*3/uL (ref 0.0–0.1)
EOS ABS: 0.1 10*3/uL (ref 0.0–0.7)
EOS PCT: 1.5 % (ref 0.0–5.0)
HEMATOCRIT: 46.6 % (ref 39.0–52.0)
HEMOGLOBIN: 15.6 g/dL (ref 13.0–17.0)
LYMPHS PCT: 21.8 % (ref 12.0–46.0)
Lymphs Abs: 1.6 10*3/uL (ref 0.7–4.0)
MCHC: 33.4 g/dL (ref 30.0–36.0)
MCV: 90.9 fl (ref 78.0–100.0)
Monocytes Absolute: 0.7 10*3/uL (ref 0.1–1.0)
Monocytes Relative: 9.2 % (ref 3.0–12.0)
Neutro Abs: 5 10*3/uL (ref 1.4–7.7)
Neutrophils Relative %: 67 % (ref 43.0–77.0)
Platelets: 243 10*3/uL (ref 150.0–400.0)
RBC: 5.13 Mil/uL (ref 4.22–5.81)
RDW: 14.4 % (ref 11.5–15.5)
WBC: 7.5 10*3/uL (ref 4.0–10.5)

## 2015-12-25 LAB — BASIC METABOLIC PANEL
BUN: 16 mg/dL (ref 6–23)
CO2: 27 meq/L (ref 19–32)
Calcium: 9.7 mg/dL (ref 8.4–10.5)
Chloride: 103 mEq/L (ref 96–112)
Creatinine, Ser: 1.2 mg/dL (ref 0.40–1.50)
GFR: 63.12 mL/min (ref >60.00)
GLUCOSE: 112 mg/dL — AB (ref 70–99)
POTASSIUM: 4.9 meq/L (ref 3.5–5.1)
Sodium: 139 mEq/L (ref 135–145)

## 2015-12-25 LAB — HEPATIC FUNCTION PANEL
ALK PHOS: 54 U/L (ref 39–117)
ALT: 16 U/L (ref 0–53)
AST: 19 U/L (ref 0–37)
Albumin: 4.3 g/dL (ref 3.5–5.2)
BILIRUBIN DIRECT: 0.1 mg/dL (ref 0.0–0.3)
Total Bilirubin: 0.5 mg/dL (ref 0.2–1.2)
Total Protein: 8.6 g/dL — ABNORMAL HIGH (ref 6.0–8.3)

## 2015-12-25 LAB — LIPID PANEL
CHOL/HDL RATIO: 6
Cholesterol: 178 mg/dL (ref 0–200)
HDL: 30.7 mg/dL — AB (ref >39.00)
NonHDL: 147.23
Triglycerides: 226 mg/dL — ABNORMAL HIGH (ref 0.0–149.0)
VLDL: 45.2 mg/dL — AB (ref 0.0–40.0)

## 2015-12-25 LAB — TSH: TSH: 3.09 u[IU]/mL (ref 0.35–4.50)

## 2015-12-25 LAB — LDL CHOLESTEROL, DIRECT: LDL DIRECT: 113 mg/dL

## 2015-12-25 NOTE — Patient Instructions (Signed)
Weight control is simply a matter of calorie balance which needs to be tilted in your favor by eating less and exercising more.  To get the most out of exercise, you need to be continuously aware that you are short of breath, but never out of breath, for 30 minutes daily. As you improve, it will actually be easier for you to do the same amount of exercise  in  30 minutes so always push to the level where you are short of breath.  If this does not result in gradual weight reduction then I strongly recommend you see a nutritionist with a food diary x 2 weeks so that we can work out a negative calorie balance which is universally effective in steady weight loss programs.  Think of your calorie balance like you do your bank account where in this case you want the balance to go down so you must take in less calories than you burn up.  It's just that simple:  Hard to do, but easy to understand.  Good luck!   Please remember to go to the lab and x-ray department downstairs for your tests - we will call you with the results when they are available.     Please schedule a follow up visit in 6 months but call sooner if needed

## 2015-12-25 NOTE — Progress Notes (Signed)
LMTCB

## 2015-12-25 NOTE — Progress Notes (Signed)
Subjective:    Patient ID: Bill Fox, male    DOB: 16-Aug-1940 .   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     History of Present Illness  05/01/2015 Follow up : HTN , Hyperlipidemia , Obesity  Patient returns for a three-month follow-up He says overall that he has been doing okay. Did have a fall 2 months ago with left knee injury. He has been followed up with his orthopedist. chest x-ray last visit showed a possible lung nodule. Subsequent CT chest showed no nodule is stable granulomatous disease.  labs. Last visit showed slightly elevated TSH around 5 . Discussed getting a repeat today   Patient denies any chest pain, orthopnea, PND or leg swelling rec No change rx  Recheck tsh > nl    09/18/2015  f/u ov/Bill Fox re:  Hbp/ obesity/ djd/ UC with gib on aleve / f/u ? SPN Chief Complaint  Patient presents with  . Follow-up    Pt c/o left knee pain- seeing Bill Fox at [place]. He states he may have to eventually have TKR. He states that he has been taking aleve and has noticed some bloody stools off and on.   Acute left knee strain Jan 2017 and started taking aleve but variable rectal bleeding since then and wants to consider prednisone but hasn't discussed yet with Bill Fox rec Try meloxicam 7.5 mg twice daily as needed for joint pain     12/25/2015  f/u ov/Bill Fox re:  Hbp/ obesity/djd  Chief Complaint  Patient presents with  . Annual Exam    Pt is fasting. He is doing well overall and denies any new co's today.   knee Fox, seemed to respond to mobic but did not refill and no flare off   Doing treadmill daily x 30 min x breaking a sweat but never sob/ cp/ claudication   No obvious day to day or daytime variability or assoc excess/ purulent sputum or mucus plugs or  hemoptysis or cp or chest tightness, subjective wheeze or overt sinus or hb symptoms. No unusual exp hx or h/o childhood pna/ asthma or knowledge of premature birth.  Sleeping ok without nocturnal  or early am exacerbation  of respiratory  c/o's or need for noct saba. Also denies any obvious fluctuation of symptoms with weather or environmental changes or other aggravating or alleviating factors except as outlined above   Current Medications, Allergies, Complete Past Medical History, Past Surgical History, Family History, and Social History were reviewed in [employer] record.  ROS  The following are not active complaints unless bolded sore throat, dysphagia, dental problems, itching, sneezing,  nasal congestion or excess/ purulent secretions, ear ache,   fever, chills, sweats, unintended wt loss, classically pleuritic or exertional cp,  orthopnea pnd or leg swelling, presyncope, palpitations, abdominal pain, anorexia, nausea, vomiting, diarrhea  or change in bowel or bladder habits, change in stools or urine, dysuria,hematuria,  rash, arthralgias, visual complaints, headache, numbness, weakness or ataxia or problems with walking or coordination,  change in mood/affect or memory.                         Past Medical History:  ULCERATIVE COLITIS...................................................................................Marland KitchenHenrene Fox / Bill Fox + COLONIC  POLYPS, HX OF (ICD-V12.72)  - colonoscopy 07/24/2007  - colonoscopy 06/08/09: mucosal changes consistent with moderately severe  left-sided ulcerative colitis involving the rectum to distal transverse colon confluent  HYPERLIPIDEMIA (ICD-272.4)  - target LDL < 130 (pos HBP, male, former smoker)  MORBID OBESITY (ICD-278.01)  - ideal = 168 target 208  - Did not keep nutrition appt 07/04/09  BENIGN POSITIONAL VERTIGO, HX OF (ICD-V12.49)  HYPERTENSION (ICD-401.9)  Microsocopic Hematuria, neg w/u 1999, resent to Urology  August 03, 2009 >>> negative  PULMONARY EMBOLISM 07/06/2008  - Left DVT by venous doppler 07/07/2008 (asymptomatic) > neg venous doppler 11/01/08  - nl  ECHO 07/08/08  DJD R knee pain .................................................................................          Bill Fox  HEALTH MAINTENANCE.......................................................................        Bill Fox  - Pneumovax 06/20/2008  -  Prevnar 13 01/30/2015  - Td 09/03/2011  - CPX  12/25/2015  coronary calcifications ? significance 08/2009         Objective:   Physical Exam    amb somber wm   12/25/2015         273  09/18/15 277 lb (125.646 kg)  07/31/15 278 lb (126.1 kg)  05/01/15 275 lb (124.739 kg)    Vital signs reviewed    HEENT:3 remaining upper teeth poor condition/ nl  turbinates, and orophanx.      NECK :  without JVD/Nodes/TM/ nl carotid upstrokes bilaterally   LUNGS: no acc muscle use, clear to A and P bilaterally without cough on insp or exp maneuvers   CV:  RRR  no s3 or murmur or increase in P2, no edema -  Post tibs > Dorsalis pedis   ABD:  soft and nontender with nl excursion in the supine position. No bruits or organomegaly, bowel sounds nl  MS:  warm without deformities, calf tenderness, cyanosis or clubbing/   SKIN: warm and dry with multiple seb keratosis lesions    NEURO:  alert, approp, no deficits  Rectal per GI   CXR PA and Lateral:   12/25/2015 :    I personally reviewed images and agree with radiology impression as follows:    Mild bibasilar atelectasis and/or infiltrates. A component of bibasilar scarring may also be present   Labs ordered/ reviewed:      Chemistry      Component Value Date/Time   NA 139 12/25/2015 0929   K 4.9 12/25/2015 0929   CL 103 12/25/2015 0929   CO2 27 12/25/2015 0929   BUN 16 12/25/2015 0929   CREATININE 1.20 12/25/2015 0929      Component Value Date/Time   CALCIUM 9.7 12/25/2015 0929   ALKPHOS 54 12/25/2015 0929   AST 19 12/25/2015  0929   ALT 16 12/25/2015 0929   BILITOT 0.5 12/25/2015 0929       ekg 12/25/2015  Nsr/ wnl   Lab Results  Component Value Date   WBC 7.5 12/25/2015   HGB 15.6 12/25/2015   HCT 46.6 12/25/2015   MCV 90.9 12/25/2015   PLT 243.0 12/25/2015         Lab Results  Component Value Date   TSH 3.09 12/25/2015                      Assessment & Plan:

## 2015-12-25 NOTE — Telephone Encounter (Signed)
Called and spoke with pt and he is aware of results per MW> nothing further is needed.

## 2015-12-27 ENCOUNTER — Encounter: Payer: Self-pay | Admitting: Internal Medicine

## 2015-12-27 NOTE — Assessment & Plan Note (Signed)
  Lab Results  Component Value Date   HGB 15.6 12/25/2015   HGB 15.1 07/31/2015   HGB 14.7 07/12/2015    Resolved off nsaids > f/u GI planned for underlying UC

## 2015-12-27 NOTE — Assessment & Plan Note (Addendum)
ideal = 168 target 208 for BMI < 30  Body mass index is 37.11  - no real change   Lab Results  Component Value Date   TSH 3.09 12/25/2015     Contributing to gerd tendency/ doe/reviewed the need and the process to achieve and maintain neg calorie balance > see avs  Each maintenance medication was reviewed in detail including most importantly the difference between maintenance and as needed and under what circumstances the prns are to be used.  Please see instructions for details which were reviewed in writing and the patient given a copy.

## 2015-12-27 NOTE — Assessment & Plan Note (Signed)
Improved on mobic, ok to take prn, prefer over other nsaids due to UC/ tendency to rectal bleeding/

## 2015-12-27 NOTE — Assessment & Plan Note (Signed)
Estimated Creatinine Clearance: 75.7 mL/min (by C-G formula based on SCr of 1.2 mg/dL).  Adequate control on present rx, reviewed > no change in rx needed

## 2015-12-27 NOTE — Assessment & Plan Note (Addendum)
Target LDL < 130 due to hbp and Type A personality  Lab Results  Component Value Date   CHOL 178 12/25/2015   HDL 30.70 (L) 12/25/2015   LDLCALC 96 01/30/2015   LDLDIRECT 113.0 12/25/2015   TRIG 226.0 (H) 12/25/2015   CHOLHDL 6 12/25/2015    Adequate control on present rx, reviewed > no change in rx needed

## 2016-02-15 ENCOUNTER — Other Ambulatory Visit: Payer: Self-pay | Admitting: Internal Medicine

## 2016-03-21 DIAGNOSIS — J209 Acute bronchitis, unspecified: Secondary | ICD-10-CM | POA: Diagnosis not present

## 2016-03-21 DIAGNOSIS — J0100 Acute maxillary sinusitis, unspecified: Secondary | ICD-10-CM | POA: Diagnosis not present

## 2016-03-25 DIAGNOSIS — J189 Pneumonia, unspecified organism: Secondary | ICD-10-CM | POA: Diagnosis not present

## 2016-03-27 ENCOUNTER — Encounter: Payer: Self-pay | Admitting: Internal Medicine

## 2016-03-27 ENCOUNTER — Ambulatory Visit (INDEPENDENT_AMBULATORY_CARE_PROVIDER_SITE_OTHER): Payer: Medicare Other | Admitting: Internal Medicine

## 2016-03-27 ENCOUNTER — Ambulatory Visit (INDEPENDENT_AMBULATORY_CARE_PROVIDER_SITE_OTHER)
Admission: RE | Admit: 2016-03-27 | Discharge: 2016-03-27 | Disposition: A | Discharge status: Home / Self Care | Payer: Medicare Other | Source: Ambulatory Visit | Attending: Internal Medicine | Admitting: Internal Medicine

## 2016-03-27 VITALS — BP 132/70 | HR 86 | Ht 72.0 in | Wt 267.0 lb

## 2016-03-27 DIAGNOSIS — R059 Cough, unspecified: Secondary | ICD-10-CM

## 2016-03-27 DIAGNOSIS — R05 Cough: Secondary | ICD-10-CM | POA: Diagnosis not present

## 2016-03-27 DIAGNOSIS — M25561 Pain in right knee: Secondary | ICD-10-CM | POA: Diagnosis not present

## 2016-03-27 DIAGNOSIS — I10 Essential (primary) hypertension: Secondary | ICD-10-CM | POA: Diagnosis not present

## 2016-03-27 DIAGNOSIS — G8929 Other chronic pain: Secondary | ICD-10-CM

## 2016-03-27 MED ORDER — MELOXICAM 7.5 MG PO TABS: ORAL_TABLET | ORAL | Status: DC

## 2016-03-27 NOTE — Assessment & Plan Note (Signed)
Adequate control on present rx, reviewed > no change in rx needed   

## 2016-03-27 NOTE — Progress Notes (Signed)
lmtcb

## 2016-03-27 NOTE — Assessment & Plan Note (Signed)
Acute onset 03/21/16 resolving/ no evidence of pna/ no f/u needed

## 2016-03-27 NOTE — Progress Notes (Signed)
Subjective:    Patient ID: Bill Fox, male    DOB: Oct 17, 1940 .   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     History of Present Illness  05/01/2015 Follow up : HTN , Hyperlipidemia , Obesity  Patient returns for a three-month follow-up He says overall that he has been doing okay. Did have a fall 2 months ago with left knee injury. He has been followed up with his orthopedist. chest x-ray last visit showed a possible lung nodule. Subsequent CT chest showed no nodule is stable granulomatous disease.  labs. Last visit showed slightly elevated TSH around 5 . Discussed getting a repeat today   Patient denies any chest pain, orthopnea, PND or leg swelling rec No change rx  Recheck tsh > nl    09/18/2015  f/u ov/Bill Fox re:  Hbp/ obesity/ djd/ UC with gib on aleve / f/u ? SPN Chief Complaint  Patient presents with  . Follow-up    Pt c/o left knee pain- seeing Dr Maxie Better at [place]. He states he may have to eventually have TKR. He states that he has been taking aleve and has noticed some bloody stools off and on.   Acute left knee strain Jan 2017 and started taking aleve but variable rectal bleeding since then and wants to consider prednisone but hasn't discussed yet with Dr Tonita Cong rec Try meloxicam 7.5 mg twice daily as needed for joint pain     12/25/2015  f/u ov/Bill Fox re:  Hbp/ obesity/djd  Chief Complaint  Patient presents with  . Annual Exam    Pt is fasting. He is doing well overall and denies any new co's today.   knee better, seemed to respond to mobic but did not refill and no flare off  Doing treadmill daily x 30 min x breaking a sweat but never sob/ cp/ claudication  rec Wt loss via neg cal balance       03/27/2016  f/u ov/Bill Fox re: acute cough / hbp / obesity  / djd  Chief Complaint  Patient presents with  . Acute Visit    Pt states he developed a cold after returned from [place] 2 wks ago- was seen at [facility] and given amox and pred. He states that his cough did not improve much and then went to another doc and cxr was done. He states the told him he had the onset of PNA and gave him "2 shots". Cough has now pretty much resolved.   [place] was fine on arrival then next day abrupt onset severe coughing min white UC > no cxr amox/pred no better then returned 04/01/16 with cxr ? pna > abx shot/ some blue pill x 5 days UC in [place] and improving.  Never had purulent sputum or fever or sob      No obvious day to day or daytime variability or   mucus plugs or hemoptysis or cp or chest tightness, subjective wheeze or overt sinus or hb symptoms. No unusual exp hx or h/o childhood pna/ asthma or knowledge of premature birth.  Sleeping ok without nocturnal  or early am exacerbation  of respiratory  c/o's or need for noct saba. Also denies any obvious fluctuation  of symptoms with weather or environmental changes or other aggravating or alleviating factors except as outlined above   Current Medications, Allergies, Complete Past Medical History, Past Surgical History, Family History, and Social History were reviewed in [employer] record.  ROS  The following are not active complaints unless bolded sore throat, dysphagia, dental problems, itching, sneezing,  nasal congestion or excess/ purulent secretions, ear ache,   fever, chills, sweats, unintended wt loss, classically pleuritic or exertional cp,  orthopnea pnd or leg swelling, presyncope, palpitations, abdominal pain, anorexia, nausea, vomiting, diarrhea  or change in bowel or bladder habits, change in stools or urine, dysuria,hematuria,  rash, arthralgias, visual complaints, headache, numbness, weakness or ataxia or problems with walking or  coordination,  change in mood/affect or memory.               Past Medical History:  ULCERATIVE COLITIS...................................................................................Marland KitchenHenrene Fox / Bill Fox + COLONIC POLYPS, HX OF (ICD-V12.72)  - colonoscopy 07/24/2007  - colonoscopy 06/08/09: mucosal changes consistent with moderately severe  left-sided ulcerative colitis involving the rectum to distal transverse colon confluent  HYPERLIPIDEMIA (ICD-272.4)  - target LDL < 130 (pos HBP, male, former smoker)  MORBID OBESITY (ICD-278.01)  - ideal = 168 target 208  - Did not keep nutrition appt 07/04/09  BENIGN POSITIONAL VERTIGO, HX OF (ICD-V12.49)  HYPERTENSION (ICD-401.9)  Microsocopic Hematuria, neg w/u 1999, resent to Urology August 03, 2009 >>> negative  PULMONARY EMBOLISM 07/06/2008  - Left DVT by venous doppler 07/07/2008 (asymptomatic) > neg venous doppler 11/01/08  - nl  ECHO 07/08/08  DJD R knee pain .................................................................................          Beane  HEALTH MAINTENANCE.......................................................................        Bill Fox  - Pneumovax 06/20/2008  -  Prevnar 13 01/30/2015  - Td 09/03/2011  - CPX  12/25/2015  coronary calcifications ? significance 08/2009         Objective:   Physical Exam    amb somber wm    03/27/2016       267  12/25/2015         273  09/18/15 277 lb (125.646 kg)  07/31/15 278 lb (126.1 kg)  05/01/15 275 lb (124.739 kg)    Vital signs reviewed - Note on arrival 02 sats  94% on RA     HEENT:3 remaining upper teeth poor condition/ nl  turbinates, and oropharynx.      NECK :  without JVD/Nodes/TM/ nl carotid upstrokes bilaterally   LUNGS: no acc muscle use, clear to A and P bilaterally without cough on insp or exp maneuvers   CV:  RRR  no s3 or murmur or increase in P2, no edema -  Post tibs > Dorsalis pedis   ABD:  soft and nontender with nl excursion in the supine position. No  bruits or organomegaly, bowel sounds nl  MS:  warm without deformities, calf tenderness, cyanosis or clubbing/   SKIN: warm and dry with multiple seb keratosis lesions    NEURO:  alert, approp, no deficits   CXR PA and Lateral:   03/27/2016 :    I personally reviewed images and agree with radiology impression as follows:    Chronic bibasilar atelectasis versus scarring and remote granulomatous disease. No superimposed acute process.                Assessment & Plan:

## 2016-03-27 NOTE — Assessment & Plan Note (Signed)
ideal = 168 target 208 for BMI < 30  Body mass index is 36.21   Lab Results  Component Value Date   TSH 3.09 12/25/2015     Contributing to gerd tendency/ doe/reviewed the need and the process to achieve and maintain neg calorie balance > defer f/u primary care including intermittently monitoring thyroid status

## 2016-03-27 NOTE — Progress Notes (Signed)
Spoke with pt and notified of results per Dr. [name]. Pt verbalized understanding and denied any questions. 

## 2016-03-27 NOTE — Assessment & Plan Note (Signed)
-   referred to ortho 06/10/2013 as failed on nsaids > for arthroscopy per Dr Tonita Cong - 12/03/13 1. Right knee arthroscopy.  2. Partial medial and lateral meniscectomy.  3. Chondroplasty of patella, femoral sulcus, medial femoral condyle,  medial tibial plateau, lateral plateau   Better now so stopped mobic, reminded him this is prn should it recur and ok to take up to bid with meals

## 2016-03-27 NOTE — Patient Instructions (Signed)
As needed for joint pain may take up to  mobic 7.5 mg twice daily with meals - don't take if not having joint pain   Please remember to go to the x-ray department downstairs for your tests - we will call you with the results when they are available.   Please schedule a follow up visit in 3 months but call sooner if needed

## 2016-04-22 DIAGNOSIS — Z23 Encounter for immunization: Secondary | ICD-10-CM | POA: Diagnosis not present

## 2016-05-23 ENCOUNTER — Telehealth: Payer: Self-pay | Admitting: Internal Medicine

## 2016-05-23 MED ORDER — MERCAPTOPURINE 50 MG PO TABS: ORAL_TABLET | ORAL | 3 refills | Status: DC

## 2016-05-23 MED ORDER — MIRTAZAPINE 30 MG PO TABS: ORAL_TABLET | ORAL | 2 refills | Status: DC

## 2016-05-23 MED ORDER — MELOXICAM 7.5 MG PO TABS: ORAL_TABLET | ORAL | 2 refills | Status: DC

## 2016-05-23 MED ORDER — MESALAMINE 400 MG PO CPDR: DELAYED_RELEASE_CAPSULE | ORAL | 1 refills | Status: DC

## 2016-05-23 MED ORDER — DOXAZOSIN MESYLATE 8 MG PO TABS: ORAL_TABLET | ORAL | 2 refills | Status: DC

## 2016-05-23 NOTE — Telephone Encounter (Signed)
Pt requesting refills.  Refills sent.  Nothing further needed.

## 2016-05-23 NOTE — Telephone Encounter (Signed)
Refilled Delzicol and 6 mp

## 2016-06-27 ENCOUNTER — Ambulatory Visit: Payer: Medicare Other | Admitting: Internal Medicine

## 2016-07-16 ENCOUNTER — Ambulatory Visit: Payer: Medicare Other | Admitting: Internal Medicine

## 2016-07-30 ENCOUNTER — Ambulatory Visit: Payer: Medicare Other | Admitting: Internal Medicine

## 2016-08-01 ENCOUNTER — Encounter: Payer: Self-pay | Admitting: Internal Medicine

## 2016-08-01 ENCOUNTER — Ambulatory Visit (INDEPENDENT_AMBULATORY_CARE_PROVIDER_SITE_OTHER): Payer: Medicare Other | Admitting: Internal Medicine

## 2016-08-01 VITALS — BP 140/72 | HR 84 | Ht 72.0 in | Wt 263.0 lb

## 2016-08-01 DIAGNOSIS — M25561 Pain in right knee: Secondary | ICD-10-CM | POA: Diagnosis not present

## 2016-08-01 DIAGNOSIS — I10 Essential (primary) hypertension: Secondary | ICD-10-CM | POA: Diagnosis not present

## 2016-08-01 DIAGNOSIS — G8929 Other chronic pain: Secondary | ICD-10-CM | POA: Diagnosis not present

## 2016-08-01 NOTE — Patient Instructions (Signed)
CPX due mid August - call sooner if needed

## 2016-08-01 NOTE — Assessment & Plan Note (Addendum)
Body mass index is 35.67 kg/m.  trending down encouraged Lab Results  Component Value Date   TSH 3.09 12/25/2015     Contributing to hbp/djd/ reviewed the need and the process to achieve and maintain neg calorie balance > defer f/u primary care including intermittently monitoring thyroid status

## 2016-08-01 NOTE — Progress Notes (Signed)
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     History of Present Illness  05/01/2015 Follow up : HTN , Hyperlipidemia , Obesity  Patient returns for a three-month follow-up He says overall that he has been doing okay. Did have a fall 2 months ago with left knee injury. He has been followed up with his orthopedist. chest x-ray last visit showed a possible lung nodule. Subsequent CT chest showed no nodule is stable granulomatous disease.  labs. Last visit showed slightly elevated TSH around 5 . Discussed getting a repeat today   Patient denies any chest pain, orthopnea, PND or leg swelling rec No change rx  Recheck tsh > nl    09/18/2015  f/u ov/Jamiya Nims re:  Hbp/ obesity/ djd/ UC with gib on aleve / f/u ? SPN Chief Complaint  Patient presents with  . Follow-up    Pt c/o left knee pain- seeing Dr Maxie Better at [place]. He states he may have to eventually have TKR. He states that he has been taking aleve and has noticed some bloody stools off and on.   Acute left knee strain Jan 2017 and started taking aleve but variable rectal bleeding since then and wants to consider prednisone but hasn't discussed yet with Dr Tonita Cong rec Try meloxicam 7.5 mg twice daily as needed for joint pain     12/25/2015  f/u ov/Muad Noga re:  Hbp/ obesity/djd  Chief Complaint  Patient presents with  . Annual Exam    Pt is fasting. He is doing well overall and denies any new co's today.   knee better, seemed to respond to mobic but did not refill and no flare off  Doing treadmill daily x 30 min x breaking a sweat but never sob/ cp/ claudication  rec Wt loss via neg cal balance       03/27/2016  f/u ov/Lakelynn Severtson re: acute cough / hbp / obesity /  djd  Chief Complaint  Patient presents with  . Acute Visit    Pt states he developed a cold after returned from [place] 2 wks ago- was seen at [facility] and given amox and pred. He states that his cough did not improve much and then went to another doc and cxr was done. He states the told him he had the onset of PNA and gave him "2 shots". Cough has now pretty much resolved.   arrived back [place] 03[place]16 [place] was fine on arrival then next day abrupt onset severe coughing min white UC > no cxr amox/pred no better then returned 04/01/16 with cxr ? pna > abx shot/ some blue pill x 5 days UC in [place] and improving.  Never had purulent sputum or fever or sob    rec As needed for joint pain may take up to  mobic 7.5 mg twice daily with meals - don't take if not having joint pain  Please remember to go to the x-ray department downstairs for your tests - we will call you with the results when they are available    08/01/2016  f/u ov/Nicole Hafley re: hbp/ djd  Chief Complaint  Patient presents with  .  Follow-up    Cough has resolved. No new co's today.   still working 40 h per week / on his feet/ mobic once or twice a week at most for L knee pain, no recent ortho f/u  No obvious day to day or daytime variability or assoc excess/ purulent sputum or mucus plugs or hemoptysis or cp or chest tightness, subjective wheeze or overt sinus or hb symptoms. No unusual exp hx or h/o childhood pna/ asthma or knowledge of premature birth.  Sleeping ok without nocturnal  or early am exacerbation  of respiratory  c/o's or need for noct saba. Also denies any obvious fluctuation of symptoms with weather or environmental changes or other aggravating or alleviating factors except as outlined above   Current Medications, Allergies, Complete Past Medical History, Past Surgical History, Family History, and Social History were reviewed in [employer] record.  ROS  The following are not active  complaints unless bolded sore throat, dysphagia, dental problems, itching, sneezing,  nasal congestion or excess/ purulent secretions, ear ache,   fever, chills, sweats, unintended wt loss, classically pleuritic or exertional cp,  orthopnea pnd or leg swelling, presyncope, palpitations, abdominal pain, anorexia, nausea, vomiting, diarrhea  or change in bowel or bladder habits, change in stools or urine, dysuria,hematuria,  rash, arthralgias, visual complaints, headache, numbness, weakness or ataxia or problems with walking or coordination,  change in mood/affect or memory.                     Past Medical History:  ULCERATIVE COLITIS...................................................................................Marland KitchenHenrene Pastor / Donne Hazel + COLONIC POLYPS, HX OF (ICD-V12.72)  - colonoscopy 07/24/2007  - colonoscopy 06/08/09: mucosal changes consistent with moderately severe  left-sided ulcerative colitis involving the rectum to distal transverse colon confluent  HYPERLIPIDEMIA (ICD-272.4)  - target LDL < 130 (pos HBP, male, former smoker)  MORBID OBESITY (ICD-278.01)  - ideal = 168 target 208  - Did not keep nutrition appt 07/04/09  BENIGN POSITIONAL VERTIGO, HX OF (ICD-V12.49)  HYPERTENSION (ICD-401.9)  Microsocopic Hematuria, neg w/u 1999, resent to Urology August 03, 2009 >>> negative  PULMONARY EMBOLISM 07/06/2008  - Left DVT by venous doppler 07/07/2008 (asymptomatic) > neg venous doppler 11/01/08  - nl  ECHO 07/08/08  DJD R knee pain .................................................................................          [place]  HEALTH MAINTENANCE.......................................................................        Anah Billard  - Pneumovax 06/20/2008  -  Prevnar 13 01/30/2015  - Td 09/03/2011  - CPX  12/25/2015  coronary calcifications ? significance 08/2009         Objective:   Physical Exam    amb somber wm    08/01/2016       263  03/27/2016       267  12/25/2015         273    09/18/15 277 lb (125.646 kg)  07/31/15 278 lb (126.1 kg)  05/01/15 275 lb (124.739 kg)    Vital signs reviewed - Note on arrival 02 sats  95% on RA     HEENT:3 remaining upper teeth poor condition/ no lower/ nl  turbinates, and oropharynx.      NECK :  without JVD/Nodes/TM/ nl carotid upstrokes bilaterally   LUNGS: no acc muscle use, clear to A and P bilaterally without cough on insp or exp maneuvers   CV:  RRR  no s3 or murmur or increase in P2, no edema -  Post tibs > Dorsalis pedis   ABD:  soft and nontender with nl excursion in the supine position. No bruits or organomegaly, bowel sounds nl  MS:  warm without deformities, calf tenderness, cyanosis or clubbing/  Moderate crepitance L Knee   SKIN: warm and dry with multiple seb keratosis lesions    NEURO:  alert, approp, no deficits                   Assessment & Plan:

## 2016-08-01 NOTE — Assessment & Plan Note (Signed)
-   referred to ortho 06/10/2013 as failed on nsaids > for arthroscopy per Dr Tonita Cong - 12/03/13 1. Right knee arthroscopy.  2. Partial medial and lateral meniscectomy.  3. Chondroplasty of patella, femoral sulcus, medial femoral condyle,  medial tibial plateau, lateral plateau   I had an extended discussion with the patient reviewing all relevant studies completed to date and  lasting 15 to 20 minutes of a 25 minute visit on the following ongoing concerns:   1) reviewed medical rx for knee   2) note only using mobic  7.5 once or twice a week so ok to push up to as much as bid with meals as needed  3) if noting def trend up in mobic need though > f/u Dr Tonita Cong   4) Each maintenance medication was reviewed in detail including most importantly the difference between maintenance and as needed and under what circumstances the prns are to be used.  Please see AVS for specific  Instructions which are unique to this visit and I personally typed out  which were reviewed in detail in writing with the patient and a copy provided.

## 2016-08-01 NOTE — Assessment & Plan Note (Signed)
Adequate control on present rx, reviewed in detail with pt > no change in rx needed

## 2016-11-11 ENCOUNTER — Telehealth: Payer: Self-pay | Admitting: Internal Medicine

## 2016-11-11 MED ORDER — TRAMADOL HCL 50 MG PO TABS: 50.0000 mg | ORAL_TABLET | ORAL | 0 refills | Status: DC | PRN

## 2016-11-11 NOTE — Telephone Encounter (Signed)
Spoke with patient. He has an appt with a dentist tomorrow. His old dentist has retired. PCP is [facility]. He has had the toothache for over a week and OTC meds are not helping. He tried to make an appt with an urgent care and they told him they would not see him due to it being a dental concern and that he needed to contact his PCP.   MW, please advise. Thanks.

## 2016-11-11 NOTE — Telephone Encounter (Signed)
Can call in tramadol 50 mg #24 to use up to 2 every 4 hours prn

## 2016-11-11 NOTE — Telephone Encounter (Signed)
Rx has been called in. Pt is aware. Nothing further was needed.

## 2017-01-02 ENCOUNTER — Other Ambulatory Visit (INDEPENDENT_AMBULATORY_CARE_PROVIDER_SITE_OTHER): Payer: Medicare Other

## 2017-01-02 ENCOUNTER — Ambulatory Visit (INDEPENDENT_AMBULATORY_CARE_PROVIDER_SITE_OTHER)
Admission: RE | Admit: 2017-01-02 | Discharge: 2017-01-02 | Disposition: A | Discharge status: Home / Self Care | Payer: Medicare Other | Source: Ambulatory Visit | Attending: Internal Medicine | Admitting: Internal Medicine

## 2017-01-02 ENCOUNTER — Ambulatory Visit (INDEPENDENT_AMBULATORY_CARE_PROVIDER_SITE_OTHER): Payer: Medicare Other | Admitting: Internal Medicine

## 2017-01-02 ENCOUNTER — Encounter: Payer: Self-pay | Admitting: Internal Medicine

## 2017-01-02 VITALS — BP 136/74 | HR 89 | Ht 72.0 in | Wt 251.0 lb

## 2017-01-02 DIAGNOSIS — I10 Essential (primary) hypertension: Secondary | ICD-10-CM | POA: Diagnosis not present

## 2017-01-02 DIAGNOSIS — E785 Hyperlipidemia, unspecified: Secondary | ICD-10-CM

## 2017-01-02 DIAGNOSIS — R42 Dizziness and giddiness: Secondary | ICD-10-CM

## 2017-01-02 DIAGNOSIS — Z86010 Personal history of colonic polyps: Secondary | ICD-10-CM | POA: Diagnosis not present

## 2017-01-02 LAB — LIPID PANEL
CHOLESTEROL: 157 mg/dL (ref 0–200)
HDL: 29.2 mg/dL — AB (ref >39.00)
LDL CALC: 102 mg/dL — AB (ref 0–99)
NonHDL: 128.21
Total CHOL/HDL Ratio: 5
Triglycerides: 133 mg/dL (ref 0.0–149.0)
VLDL: 26.6 mg/dL (ref 0.0–40.0)

## 2017-01-02 LAB — URINALYSIS, ROUTINE W REFLEX MICROSCOPIC
Bilirubin Urine: NEGATIVE
Ketones, ur: NEGATIVE
Leukocytes, UA: NEGATIVE
Nitrite: NEGATIVE
SPECIFIC GRAVITY, URINE: 1.01 (ref 1.000–1.030)
TOTAL PROTEIN, URINE-UPE24: NEGATIVE
URINE GLUCOSE: NEGATIVE
UROBILINOGEN UA: 0.2 (ref 0.0–1.0)
pH: 5.5 (ref 5.0–8.0)

## 2017-01-02 LAB — CBC WITH DIFFERENTIAL/PLATELET
BASOS ABS: 0 10*3/uL (ref 0.0–0.1)
Basophils Relative: 0.8 % (ref 0.0–3.0)
EOS ABS: 0.1 10*3/uL (ref 0.0–0.7)
EOS PCT: 1.9 % (ref 0.0–5.0)
HCT: 45.6 % (ref 39.0–52.0)
Hemoglobin: 15.1 g/dL (ref 13.0–17.0)
LYMPHS ABS: 1 10*3/uL (ref 0.7–4.0)
Lymphocytes Relative: 20.1 % (ref 12.0–46.0)
MCHC: 33.1 g/dL (ref 30.0–36.0)
MCV: 93.2 fl (ref 78.0–100.0)
MONO ABS: 0.6 10*3/uL (ref 0.1–1.0)
Monocytes Relative: 11.4 % (ref 3.0–12.0)
NEUTROS ABS: 3.4 10*3/uL (ref 1.4–7.7)
NEUTROS PCT: 65.8 % (ref 43.0–77.0)
PLATELETS: 198 10*3/uL (ref 150.0–400.0)
RBC: 4.89 Mil/uL (ref 4.22–5.81)
RDW: 14 % (ref 11.5–15.5)
WBC: 5.2 10*3/uL (ref 4.0–10.5)

## 2017-01-02 LAB — BASIC METABOLIC PANEL
BUN: 16 mg/dL (ref 6–23)
CHLORIDE: 105 meq/L (ref 96–112)
CO2: 27 meq/L (ref 19–32)
Calcium: 9.3 mg/dL (ref 8.4–10.5)
Creatinine, Ser: 1.02 mg/dL (ref 0.40–1.50)
GFR: 75.93 mL/min (ref >60.00)
Glucose, Bld: 116 mg/dL — ABNORMAL HIGH (ref 70–99)
POTASSIUM: 4 meq/L (ref 3.5–5.1)
Sodium: 140 mEq/L (ref 135–145)

## 2017-01-02 LAB — HEPATIC FUNCTION PANEL
ALBUMIN: 4 g/dL (ref 3.5–5.2)
ALK PHOS: 48 U/L (ref 39–117)
ALT: 15 U/L (ref 0–53)
AST: 16 U/L (ref 0–37)
BILIRUBIN DIRECT: 0.2 mg/dL (ref 0.0–0.3)
BILIRUBIN TOTAL: 0.5 mg/dL (ref 0.2–1.2)
Total Protein: 7.4 g/dL (ref 6.0–8.3)

## 2017-01-02 LAB — TSH: TSH: 2.61 u[IU]/mL (ref 0.35–4.50)

## 2017-01-02 NOTE — Progress Notes (Signed)
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 Did have a fall 2 months ago with left knee injury. He has been followed up with his orthopedist. chest x-ray last visit showed a possible lung nodule. Subsequent CT chest showed no nodule is stable granulomatous disease.  labs. Last visit showed slightly elevated TSH around 5 . Discussed getting a repeat today   Patient denies any chest pain, orthopnea, PND or leg swelling rec No change rx  Recheck tsh > nl     01/02/2017  f/u ov/Bill Fox re:  HBP/ hyperlipidemia/ obesity Chief Complaint  Patient presents with  . Follow-up    CPX.  pt c/o increased lightheadedness X2-3 weeks.    vague complaint x 3 weeks feels lightheaded even lying down, waxes and wanes and avg qod/ no assoc vertigo - once it starts it lasts most of the day. avg use mobic twice weekly controls joint pain back and hands. Not limited by breathing from desired activities     No obvious day to day or daytime variability or assoc excess/ purulent sputum or mucus plugs or hemoptysis or cp or chest tightness, subjective wheeze or overt sinus or hb symptoms. No unusual exp hx or h/o childhood pna/ asthma or knowledge of premature birth.  Sleeping ok without nocturnal  or early am exacerbation  of respiratory  c/o's or need for noct saba. Also denies any obvious fluctuation of symptoms with weather or environmental changes or other aggravating or alleviating factors except as outlined above   Current Medications, Allergies, Complete Past Medical History, Past  Surgical History, Family History, and Social History were reviewed in [employer] record.  ROS  The following are not active complaints unless bolded sore throat, dysphagia, dental problems, itching, sneezing,  nasal congestion or excess/ purulent secretions, ear ache,   fever, chills, sweats, unintended wt loss, classically pleuritic or exertional cp,  orthopnea pnd or leg swelling, presyncope, palpitations, abdominal pain, anorexia, nausea, vomiting, diarrhea  or change in bowel or bladder habits, change in stools or urine, dysuria,hematuria,  rash, arthralgias, visual complaints, headache, numbness, weakness/fatigue or ataxia or problems with walking or coordination,  change in mood/affect or memory.                                        Past Medical History:  ULCERATIVE COLITIS...................................................................................Marland KitchenHenrene Fox / Donne Hazel + COLONIC POLYPS, HX OF (ICD-V12.72)  - colonoscopy 07/24/2007  - colonoscopy 06/08/09: mucosal changes consistent with moderately severe  left-sided ulcerative colitis involving the rectum to distal transverse colon confluent  HYPERLIPIDEMIA (ICD-272.4)  - target LDL < 130 (pos HBP, male, former smoker)  MORBID OBESITY (ICD-278.01)  - ideal = 168 target 208  - Did not keep nutrition appt 07/04/09  BENIGN POSITIONAL VERTIGO, HX OF (ICD-V12.49)  HYPERTENSION (ICD-401.9)  Microsocopic Hematuria, neg w/u 1999, resent to Urology August 03, 2009 >>> negative  PULMONARY EMBOLISM 07/06/2008  - Left  DVT by venous doppler 07/07/2008 (asymptomatic) > neg venous doppler 11/01/08  - nl  ECHO 07/08/08  DJD R knee pain .................................................................................          [place]  HEALTH MAINTENANCE......................................................................Marland Kitchen        [place] PCP - Pneumovax 06/20/2008  -  Prevnar 13 01/30/2015  - Td 09/03/2011  - CPX  01/02/2017   coronary calcifications ? significance 08/2009         Objective:   Physical Exam    amb depressed appearing  wm / very evasive with details of symptoms which he mentions then minimizes   01/02/2017       251  08/01/2016       263  03/27/2016       267  12/25/2015         273  09/18/15 277 lb (125.646 kg)  07/31/15 278 lb (126.1 kg)  05/01/15 275 lb (124.739 kg)    Vital signs reviewed - Note on arrival 02 sats  95% on RA     HEENT: edentulous/ nl  turbinates, and oropharynx.   - ear canals  Mild/mod wax  bilaterally    NECK :  without JVD/Nodes/TM/ nl carotid upstrokes bilaterally   LUNGS: no acc muscle use, clear to A and P bilaterally without cough on insp or exp maneuvers   CV:  RRR  no s3 or murmur or increase in P2, no edema -  Post tibs > Dorsalis pedis (R DP esp weak)   ABD:  Very obese/soft and nontender with nl excursion in the supine position. No bruits or organomegaly, bowel sounds nl  MS:  warm without deformities, calf tenderness, cyanosis or clubbing   SKIN: warm and dry with multiple seb keratosis lesions    NEURO:  alert,  no deficits - very depressed affect/ no focal def/ no nystagmus   GU :  Testes down bilaterally / no IH or nodule  Rectal:  Mod bph, no nodules, stool G neg    Ekg 01/02/2017   nsr with decreased R wave progression? Lead placement as only slt more pronounced vs priors  CXR PA and Lateral:   01/02/2017 :    I personally reviewed images and agree with radiology impression as follows:   Chronic changes without acute abnormality.   Labs ordered/ reviewed:      Chemistry      Component Value Date/Time   NA 140 01/02/2017 0926   K 4.0 01/02/2017 0926   CL 105 01/02/2017 0926   CO2 27 01/02/2017 0926   BUN 16 01/02/2017 0926   CREATININE 1.02 01/02/2017 0926      Component Value Date/Time   CALCIUM 9.3 01/02/2017 0926   ALKPHOS 48 01/02/2017 0926   AST 16 01/02/2017 0926   ALT 15 01/02/2017 0926   BILITOT 0.5  01/02/2017 0926        Lab Results  Component Value Date   WBC 5.2 01/02/2017   HGB 15.1 01/02/2017   HCT 45.6 01/02/2017   MCV 93.2 01/02/2017   PLT 198.0 01/02/2017       Lab Results  Component Value Date   TSH 2.61 01/02/2017                    Assessment & Plan:   Outpatient Encounter Prescriptions as of 01/02/2017  Medication Sig  . doxazosin (CARDURA) 8 MG tablet TAKE ONE-HALF (1/2) TABLET AT BEDTIME  . meloxicam (MOBIC) 7.5 MG tablet One twice daily with meals  as needed  . mercaptopurine (PURINETHOL) 50 MG tablet TAKE 3 TABLETS DAILY ON AN EMPTY STOMACH 1 HOUR BEFORE OR 2 HOURS AFTER A MEAL (NEED OFFICE VISIT FOR FURTHER REFILLS)  . Mesalamine (DELZICOL) 400 MG CPDR DR capsule TAKE 4 CAPSULES THREE TIMES A DAY (NEEDS OFFICE VISIT FOR FURTHER REFILLS)  . mirtazapine (REMERON) 30 MG tablet TAKE 1 TABLET AT BEDTIME  . [DISCONTINUED] traMADol (ULTRAM) 50 MG tablet Take 1-2 tablets (50-100 mg total) by mouth every 4 (four) hours as needed. (Patient not taking: Reported on 01/02/2017)   No facility-administered encounter medications on file as of 01/02/2017.

## 2017-01-02 NOTE — Patient Instructions (Signed)
Please see patient coordinator before you leave today  to schedule refer to [facility] medicine [facility]   Please remember to go to the lab and x-ray department downstairs in the basement  for your tests - we will call you with the results when they are available.    Follow up here is as needed for respiratory problems once you have a primary care doctor

## 2017-01-06 DIAGNOSIS — R42 Dizziness and giddiness: Secondary | ICD-10-CM | POA: Insufficient documentation

## 2017-01-06 NOTE — Assessment & Plan Note (Signed)
Target LDL < 130 due to hbp and Type A personality  Lab Results  Component Value Date   CHOL 157 01/02/2017   HDL 29.20 (L) 01/02/2017   LDLCALC 102 (H) 01/02/2017   LDLDIRECT 113.0 12/25/2015   TRIG 133.0 01/02/2017   CHOLHDL 5 01/02/2017    HDL a bit low, rec more regular aerobic ex / eat more fish

## 2017-01-06 NOTE — Assessment & Plan Note (Signed)
  Lab Results  Component Value Date   HGB 15.1 01/02/2017   HGB 15.6 12/25/2015   HGB 15.1 07/31/2015     F/u UC  GI

## 2017-01-06 NOTE — Assessment & Plan Note (Signed)
Onset 12/2016 s orthostatic or positional component > strongly suspect this is related to depression, though he minimizes this when asked and says he's been taking remeron 30 mg qhs consistently   I had an extended final summary discussion with the patient reviewing all relevant studies completed to date and  lasting 15 to 20 minutes of a 25 minute visit on the following issues:   Labs reviewed / no change meds: Each maintenance medication was reviewed in detail including most importantly the difference between maintenance and as needed and under what circumstances the prns are to be used.  Please see AVS for specific  Instructions which are unique to this visit and I personally typed out  which were reviewed in detail in writing with the patient and a copy provided.     Needs to establish with PCP next who does primary care and consider either psych or neuro referral if symptoms persist in meantime

## 2017-01-06 NOTE — Assessment & Plan Note (Signed)
Lab Results  Component Value Date   CREATININE 1.02 01/02/2017   CREATININE 1.20 12/25/2015   CREATININE 1.15 07/12/2015     Adequate control on present rx, reviewed in detail with pt > no change in rx needed  = cardura 8 mg one half hs

## 2017-02-27 DIAGNOSIS — Z23 Encounter for immunization: Secondary | ICD-10-CM | POA: Diagnosis not present

## 2017-03-07 ENCOUNTER — Telehealth: Payer: Self-pay | Admitting: Internal Medicine

## 2017-03-07 NOTE — Telephone Encounter (Signed)
Patient reports he has only had the blood once. Denies pain, nausea or frequent stooling. He is offered an appointment on 03/10/17. He declines, stating he needs the appointment to an early morning appointment. Agrees to call if he worsens. Appointment 03/19/17 at 9:00 am.

## 2017-03-10 ENCOUNTER — Ambulatory Visit: Payer: Medicare Other | Admitting: Physician Assistant

## 2017-03-19 ENCOUNTER — Ambulatory Visit: Payer: Medicare Other | Admitting: Physician Assistant

## 2017-04-03 ENCOUNTER — Ambulatory Visit: Payer: Medicare Other | Admitting: Family Medicine

## 2017-04-18 ENCOUNTER — Ambulatory Visit (INDEPENDENT_AMBULATORY_CARE_PROVIDER_SITE_OTHER): Payer: Medicare Other | Admitting: Family Medicine

## 2017-04-18 ENCOUNTER — Encounter: Payer: Self-pay | Admitting: Family Medicine

## 2017-04-18 VITALS — BP 128/76 | HR 93 | Temp 98.9°F | Ht 72.0 in | Wt 258.1 lb

## 2017-04-18 DIAGNOSIS — I10 Essential (primary) hypertension: Secondary | ICD-10-CM

## 2017-04-18 DIAGNOSIS — F419 Anxiety disorder, unspecified: Secondary | ICD-10-CM | POA: Diagnosis not present

## 2017-04-18 NOTE — Progress Notes (Signed)
Chief Complaint  Patient presents with  . Establish Care       New Patient Visit SUBJECTIVE: HPI: Bill Fox is an 76 y.o.male who is being seen for establishing care.  The patient was previously seen at [facility] office.  Hypertension Patient presents for hypertension follow up. He does not routinely monitor home blood pressures. He is compliant with medication- Cardura 4 mg nightly.  Patient has these side effects of medication: none He is adhering to a healthy diet overall. Exercise: walks 30 min/d, 4d/week Interested in coming off of med. He denies chest pain or shortness of breath.  He has a history of anxiety that is well controlled on the mirtazapine.  He is compliant and reports no adverse effects.  No suicidal or homicidal ideation.  No Known Allergies  Past Medical History:  Diagnosis Date  . Anxiety   . At risk for sleep apnea    STOP-BANG= 5     SENT TO PCP 11-29-2013  . Chronic fatigue   . DJD (degenerative joint disease)    RIGHT KNEE  . History of colon polyps    2011  &  2013 --  ADENOMATOUS  . History of pulmonary embolism   . Hyperlipidemia    pt denies ever having high chol  . Hypertension   . Right knee meniscal tear   . Ulcerative colitis   . Wears glasses    Past Surgical History:  Procedure Laterality Date  . CATARACT EXTRACTION W/ INTRAOCULAR LENS IMPLANT Left 2014  . COLONOSCOPY W/ POLYPECTOMY  MULTIPLE--  LAST ONE 09-12-2011  . KNEE ARTHROSCOPY WITH DRILLING/MICROFRACTURE Right 12/03/2013   Procedure: RIGHT KNEE ARTHROSCOPY PARTIAL MEDIAL MENISCECTOMY DEBRIDEMENT SYNOVECTOMY;  Surgeon: Johnn Hai, MD;  Location: [facility];  Service: Orthopedics;  Laterality: Right;  . NASAL FRACTURE SURGERY  1985  . TRANSTHORACIC ECHOCARDIOGRAM  11-01-2008   MILD LVH/  EF 36%/  GRADE I DIASTOLIC DYSFUNCTION/  MILD LAE   Social History   Socioeconomic History  . Marital status: Married  Occupational History  . Occupation:  Armed forces operational officer  Tobacco Use  . Smoking status: Former Smoker    Packs/day: 1.00    Years: 30.00    Pack years: 30.00    Types: Cigarettes    Last attempt to quit: 05/20/1993    Years since quitting: 23.9  . Smokeless tobacco: Never Used  Substance and Sexual Activity  . Alcohol use: Yes    Alcohol/week: 0.6 oz    Types: 1 Cans of beer per week    Comment: beer on occ  . Drug use: No   Family History  Problem Relation Age of Onset  . Diabetes Mother   . Cancer Father        unknown type  . Stomach cancer Father   . Lung cancer Brother   . Colon cancer Neg Hx   . Diabetes Paternal Uncle        x 2     Current Outpatient Medications:  .  mercaptopurine (PURINETHOL) 50 MG tablet, TAKE 3 TABLETS DAILY ON AN EMPTY STOMACH 1 HOUR BEFORE OR 2 HOURS AFTER A MEAL (NEED OFFICE VISIT FOR FURTHER REFILLS), Disp: 270 tablet, Rfl: 3 .  Mesalamine (DELZICOL) 400 MG CPDR DR capsule, TAKE 4 CAPSULES THREE TIMES A DAY (NEEDS OFFICE VISIT FOR FURTHER REFILLS), Disp: 1080 capsule, Rfl: 1 .  mirtazapine (REMERON) 30 MG tablet, TAKE 1 TABLET AT BEDTIME, Disp: 90 tablet, Rfl: 2  ROS Cardiovascular:  Denies chest pain  Respiratory: Denies dyspnea   OBJECTIVE: BP 128/76 (BP Location: Left Arm, Patient Position: Sitting, Cuff Size: Large)   Pulse 93   Temp 98.9 F (37.2 C) (Oral)   Ht 6' (1.829 m)   Wt 258 lb 2 oz (117.1 kg)   SpO2 94%   BMI 35.01 kg/m   Constitutional: -  VS reviewed -  Well developed, well nourished, appears stated age -  No apparent distress  Psychiatric: -  Oriented to person, place, and time -  Memory intact -  Affect and mood normal -  Fluent conversation, good eye contact -  Judgment and insight age appropriate  Eye: -  Conjunctivae clear, no discharge -  Pupils symmetric, round, reactive to light  ENMT: -  MMM    Pharynx moist, no exudate, no erythema  Neck: -  No gross swelling, no palpable masses -  Thyroid midline, not enlarged, mobile, no palpable masses   Cardiovascular: -  RRR -  No LE edema  Respiratory: -  Normal respiratory effort, no accessory muscle use, no retraction -  Breath sounds equal, no wheezes, no ronchi, no crackles  Musculoskeletal: -  No clubbing, no cyanosis -  Gait normal  Skin: -  No significant lesion on inspection -  Warm and dry to palpation   ASSESSMENT/PLAN: Essential hypertension  Anxiety  Will D/C Cardura for now.  He does have a home blood pressure monitor and will check his blood pressure 3 times per week over the next 4 weeks.  Return for a nurse visit at that time.  Bring blood pressure monitor and readings to that appointment and we will let him know if he needs to stay on the Cardura or stay off of it. Continue mirtazapine. Patient should return in 6 mo for med check. The patient voiced understanding and agreement to the plan.   [place], DO 04/18/17  12:00 PM

## 2017-04-18 NOTE — Patient Instructions (Addendum)
Check your blood pressure at home 3 times per week for the next 4 weeks. Write down your readings and bring your monitor and log to your nurse visit. We will let you know if you need to go back on our medicine.   Let us know if you need anything.

## 2017-04-18 NOTE — Progress Notes (Signed)
Pre visit review using our clinic review tool, if applicable. No additional management support is needed unless otherwise documented below in the visit note. 

## 2017-05-16 ENCOUNTER — Ambulatory Visit (INDEPENDENT_AMBULATORY_CARE_PROVIDER_SITE_OTHER): Payer: Medicare Other | Admitting: Family Medicine

## 2017-05-16 VITALS — BP 137/80 | HR 74

## 2017-05-16 DIAGNOSIS — I10 Essential (primary) hypertension: Secondary | ICD-10-CM | POA: Diagnosis not present

## 2017-05-16 NOTE — Progress Notes (Signed)
Pre visit review using our clinic tool,if applicable. No additional management support is needed unless otherwise documented below in the visit note.   Patient in for BP check per order from Dr. Deloris Ping dated 04/18/2017.  Patient states he has recorded BP readings since last visit and brought them in.  Patient has no complaints this visit.  BP today = 137/80 P=74  Home BP Monitor  BP=159/94 P=74  Per Dr. Nani Ravens Goal is 180/90, patient to continue BP checks at home. If BP goes over 150/90 patient to call office for BP follow up visit. Patietn agreed.

## 2017-05-16 NOTE — Progress Notes (Signed)
Noted. Agree with above with the exception that his goal bp is <150/90, not 180 sbp.  [place], DO 05/16/17 12:08 PM

## 2017-05-26 ENCOUNTER — Ambulatory Visit: Payer: Medicare Other | Admitting: Nurse Practitioner

## 2017-06-04 ENCOUNTER — Encounter: Payer: Self-pay | Admitting: Physician Assistant

## 2017-06-04 ENCOUNTER — Telehealth: Payer: Self-pay | Admitting: Internal Medicine

## 2017-06-04 ENCOUNTER — Ambulatory Visit (INDEPENDENT_AMBULATORY_CARE_PROVIDER_SITE_OTHER): Payer: Medicare Other | Admitting: Physician Assistant

## 2017-06-04 VITALS — BP 140/86 | HR 88 | Ht 70.0 in | Wt 261.0 lb

## 2017-06-04 DIAGNOSIS — K602 Anal fissure, unspecified: Secondary | ICD-10-CM

## 2017-06-04 DIAGNOSIS — K5190 Ulcerative colitis, unspecified, without complications: Secondary | ICD-10-CM | POA: Diagnosis not present

## 2017-06-04 DIAGNOSIS — K625 Hemorrhage of anus and rectum: Secondary | ICD-10-CM | POA: Diagnosis not present

## 2017-06-04 DIAGNOSIS — K644 Residual hemorrhoidal skin tags: Secondary | ICD-10-CM | POA: Diagnosis not present

## 2017-06-04 MED ORDER — PEG-KCL-NACL-NASULF-NA ASC-C 140 G PO SOLR: 1.0000 | ORAL | 0 refills | Status: DC

## 2017-06-04 MED ORDER — AMBULATORY NON FORMULARY MEDICATION: 1 refills | Status: DC

## 2017-06-04 NOTE — Patient Instructions (Signed)
We have sent a prescription for nitroglycerin 0.125% gel to [facility]. You should apply a pea size amount to your rectum three times daily x 6-8 weeks.  [facility]'s information is below: Address: [address], [place], [place] [address]  [phone]  Try using a sitz bath 2-3 times a day for 10-15 minutes to help relieve symptoms.   You can also use recticare with lidocaine which is an over the counter cream you can find at any pharmacy. Ask the pharmacist if you need help locating it.   You have been scheduled for a colonoscopy. Please follow written instructions given to you at your visit today.  Please pick up your prep supplies at the pharmacy within the next 1-3 days. If you use inhalers (even only as needed), please bring them with you on the day of your procedure. Your physician has requested that you go to www.startemmi.com and enter the access code given to you at your visit today. This web site gives a general overview about your procedure. However, you should still follow specific instructions given to you by our office regarding your preparation for the procedure.   Disposable Sitz Bath A disposable sitz bath is a plastic basin that fits over the toilet. A bag is hung above the toilet, and the bag is connected to a tube that opens into the basin. The bag is filled with warm water that flows into the basin through the tube. A sitz bath can be used to help relieve symptoms, clean, and promote healing in the genital and anal areas, as well as in the lower abdomen and buttocks. What are the risks? Sitz baths are generally very safe. It is possible for the skin between the genitals and the anus (perineum) to become infected, but this is rare. You can avoid this by cleaning your sitz bath supplies thoroughly. How to use a disposable sitz bath 1. Close the clamp on the tube. Make sure the clamp is closed tightly to prevent leakage. 2. Fill the sitz bath basin  and the plastic bag with warm water. The water should be warm enough to be comfortable, but not hot. 3. Raise the toilet seat and place the filled basin on the toilet. Make sure the overflow opening is facing toward the back of the toilet. ? If you prefer, you may place the empty basin on the toilet first, and then use the plastic bag to fill the basin with warm water. 4. Hang the filled plastic bag overhead on a hook or towel rack close to the toilet. The bag should be higher than the toilet so that the water will flow down through the tube. 5. Attach the tube to the opening on the basin. Make sure that the tube is attached to the basin tightly to prevent leakage. 6. Sit on the basin and release the clamp. This will allow warm water to flow into the basin and flush the area around your genitals and anus. 7. Remain sitting on the basin for about 15-20 minutes, or as long as told by your health care provider. 8. Stand up and gently pat your skin dry. If directed, apply clean bandages (dressings) to the affected area as told by your health care provider. 9. Carefully remove the basin from the toilet seat and tip the basin into the toilet to empty any remaining water. Empty any remaining water from the plastic bag into the toilet. Then, flush the toilet. 10. Wash the basin with warm  water and soap. Let the basin air dry in the sink. You should also let the plastic bag and the tubing air dry. 11. Store the basin, tubing, and plastic bag in a clean, dry area. 12. Wash your hands with soap and water. If soap and water are not available, use hand sanitizer. Contact a health care provider if:  You have symptoms that get worse instead of better.  You develop new skin irritation, redness, or swelling around your genitals or anus. This information is not intended to replace advice given to you by your health care provider. Make sure you discuss any questions you have with your health care provider. Document  Released: 11/05/2011 Document Revised: 10/12/2015 Document Reviewed: 03/26/2015 Elsevier Interactive Patient Education  [employer].

## 2017-06-04 NOTE — Progress Notes (Signed)
Chief Complaint: Ulcerative colitis, rectal bleeding  HPI:    [name] is a 77 year old Caucasian male with a past medical history of ulcerative colitis, who presents to clinic today with a complaint of rectal bleeding.      Patient is followed by Dr. Henrene Pastor.  He was last seen in clinic 07/31/15 by Nicoletta Ba for follow-up after hospitalization for rectal bleeding and some dizziness.  At that time, his hemoglobin had remained stable and his GI bleeding was felt secondary to internal hemorrhoids.  He had been placed on Anusol suppositories at bedtime since that time and had no further bleeding.  Patient's most recent colonoscopy was noted in September 2016 for follow-up of adenomatous colon polyps with no evidence of polyps but he did have some scarring in the left colon at that time with no active colitis and was noted to have internal hemorrhoids.  Biopsy showed benign mucosa but no active colitis but some features consistent with quiescent UC.  It was recommended he had a repeat in 3 years.  He was maintained on 6-MP 150 mg by mouth daily and Delzicol 4 tablets 3 times a day.    Today, the patient tells me that every 3 months he typically has some rectal bleeding.  He tells me that this will happen for about 3-4 days and then typically stops.  This is usually only with his one solid bowel movement in the morning.  This is described as bright red blood on the toilet paper.  Most recently this started 3 days ago, he continued with bleeding this morning at time of bowel movement.  He describes that this is a red blood sometimes mixed with a "more maroon colored blood".  Patient denies any change in his bowel habits denying diarrhea or constipation.  He denies any true rectal discomfort with a bowel movement.  He denies abdominal pain.    Patient denies fever, chills, heartburn, reflux, dizziness, nausea or vomiting.  Past Medical History:  Diagnosis Date  . Anxiety   . At risk for sleep apnea    STOP-BANG= 5     SENT TO PCP 11-29-2013  . Chronic fatigue   . DJD (degenerative joint disease)    RIGHT KNEE  . History of colon polyps    2011  &  2013 --  ADENOMATOUS  . History of pulmonary embolism   . Hyperlipidemia    pt denies ever having high chol  . Hypertension   . Right knee meniscal tear   . Ulcerative colitis   . Wears glasses     Past Surgical History:  Procedure Laterality Date  . CATARACT EXTRACTION W/ INTRAOCULAR LENS IMPLANT Left 2014  . COLONOSCOPY W/ POLYPECTOMY  MULTIPLE--  LAST ONE 09-12-2011  . KNEE ARTHROSCOPY WITH DRILLING/MICROFRACTURE Right 12/03/2013   Procedure: RIGHT KNEE ARTHROSCOPY PARTIAL MEDIAL MENISCECTOMY DEBRIDEMENT SYNOVECTOMY;  Surgeon: Johnn Hai, MD;  Location: [facility];  Service: Orthopedics;  Laterality: Right;  . NASAL FRACTURE SURGERY  1985  . TRANSTHORACIC ECHOCARDIOGRAM  11-01-2008   MILD LVH/  EF 16%/  GRADE I DIASTOLIC DYSFUNCTION/  MILD LAE    Current Outpatient Medications  Medication Sig Dispense Refill  . AMBULATORY NON FORMULARY MEDICATION Medication Name: Nitroglycerin ointment 0.125 mg mixed with 5% lidocaine three times a day for 6-8 weeks. 30 g 1  . mercaptopurine (PURINETHOL) 50 MG tablet TAKE 3 TABLETS DAILY ON AN EMPTY STOMACH 1 HOUR BEFORE OR 2 HOURS AFTER A MEAL (NEED OFFICE VISIT  FOR FURTHER REFILLS) 270 tablet 3  . Mesalamine (DELZICOL) 400 MG CPDR DR capsule TAKE 4 CAPSULES THREE TIMES A DAY (NEEDS OFFICE VISIT FOR FURTHER REFILLS) 1080 capsule 1  . mirtazapine (REMERON) 30 MG tablet TAKE 1 TABLET AT BEDTIME 90 tablet 2  . PEG-KCl-NaCl-NaSulf-Na Asc-C (PLENVU) 140 g SOLR Take 1 kit by mouth as directed. 1 each 0   No current facility-administered medications for this visit.     Allergies as of 06/04/2017  . (No Known Allergies)    Family History  Problem Relation Age of Onset  . Diabetes Mother   . Cancer Father        unknown type  . Stomach cancer Father   . Lung cancer Brother     . Diabetes Paternal Uncle        x 2  . Colon cancer Neg Hx     Social History   Socioeconomic History  . Marital status: Married    Spouse name: Not on file  . Number of children: Not on file  . Years of education: Not on file  . Highest education level: Not on file  Social Needs  . Financial resource strain: Not on file  . Food insecurity - worry: Not on file  . Food insecurity - inability: Not on file  . Transportation needs - medical: Not on file  . Transportation needs - non-medical: Not on file  Occupational History  . Occupation: Armed forces operational officer  Tobacco Use  . Smoking status: Former Smoker    Packs/day: 1.00    Years: 30.00    Pack years: 30.00    Types: Cigarettes    Last attempt to quit: 05/20/1993    Years since quitting: 24.0  . Smokeless tobacco: Never Used  Substance and Sexual Activity  . Alcohol use: Yes    Alcohol/week: 0.6 oz    Types: 1 Cans of beer per week    Comment: beer on occ  . Drug use: No  . Sexual activity: Not on file  Other Topics Concern  . Not on file  Social History Narrative  . Not on file    Review of Systems:    Constitutional: No weight loss, fever or chills Cardiovascular: No chest pain Respiratory: No SOB  Gastrointestinal: See HPI and otherwise negative   Physical Exam:  Vital signs: BP 140/86 (BP Location: Left Arm, Patient Position: Sitting, Cuff Size: Normal)   Pulse 88   Ht 5' 10"  (1.778 m) Comment: height measured without shoes  Wt 261 lb (118.4 kg)   BMI 37.45 kg/m   Constitutional:   Pleasant Caucasian male appears to be in NAD, Well developed, Well nourished, alert and cooperative Head:  Normocephalic and atraumatic. Eyes:   PEERL, EOMI. No icterus. Conjunctiva pink. Ears:  Normal auditory acuity. Neck:  Supple Throat: Oral cavity and pharynx without inflammation, swelling or lesion.  Respiratory: Respirations even and unlabored. Lungs clear to auscultation bilaterally.   No wheezes, crackles, or rhonchi.   Cardiovascular: Normal S1, S2. No MRG. Regular rate and rhythm. No peripheral edema, cyanosis or pallor.  Gastrointestinal:  Soft, nondistended, nontender. No rebound or guarding. Normal bowel sounds. No appreciable masses or hepatomegaly. Rectal:  External: no tags, one inflamed external hemorrhoid, no visible fissure; Internal: mild ttp; Anoscopy: posterior fissure with bleeding at time of exam, some tenderness Msk:  Symmetrical without gross deformities. Without edema, no deformity or joint abnormality.  Neurologic:  Alert and  oriented x4;  grossly normal neurologically.  Skin:  Dry and intact without significant lesions or rashes. Psychiatric:  Demonstrates good judgement and reason without abnormal affect or behaviors.  RELEVANT LABS AND IMAGING: CBC    Component Value Date/Time   WBC 5.2 01/02/2017 0926   RBC 4.89 01/02/2017 0926   HGB 15.1 01/02/2017 0926   HCT 45.6 01/02/2017 0926   PLT 198.0 01/02/2017 0926   MCV 93.2 01/02/2017 0926   MCH 30.4 07/12/2015 0614   MCHC 33.1 01/02/2017 0926   RDW 14.0 01/02/2017 0926   LYMPHSABS 1.0 01/02/2017 0926   MONOABS 0.6 01/02/2017 0926   EOSABS 0.1 01/02/2017 0926   BASOSABS 0.0 01/02/2017 0926    CMP     Component Value Date/Time   NA 140 01/02/2017 0926   K 4.0 01/02/2017 0926   CL 105 01/02/2017 0926   CO2 27 01/02/2017 0926   GLUCOSE 116 (H) 01/02/2017 0926   BUN 16 01/02/2017 0926   CREATININE 1.02 01/02/2017 0926   CALCIUM 9.3 01/02/2017 0926   PROT 7.4 01/02/2017 0926   ALBUMIN 4.0 01/02/2017 0926   AST 16 01/02/2017 0926   ALT 15 01/02/2017 0926   ALKPHOS 48 01/02/2017 0926   BILITOT 0.5 01/02/2017 0926   GFRNONAA >60 07/12/2015 0614   GFRAA >60 07/12/2015 1443    Assessment: 1.  Rectal bleeding: See below, this is likely the cause, but patient would like to go ahead and schedule his colonoscopy as well 2.  Anal fissure: Some tenderness, posterior fissure with bleeding at time of exam 3.  External  hemorrhoid: Seen at time of exam today, inflamed, no tenderness 4.  UC: Controlled, no diarrhea or abdominal pain on Delzicol 4 capsules 3 times a day and Mercaptopurine 50 mg tabs, 3 tablets daily.  Plan: 1.  Patient asked to be scheduled for his regular screening colonoscopy.  Went ahead and scheduled patient for colonoscopy in [place] with Dr. Henrene Pastor.  Did discuss risk, benefits, limitations and alternatives. 2.  Prescribed Nitroglycerin ointment to be applied 2-3 times daily for the next 6-8 weeks. 3.  Recommend sitz baths for 15-20 minutes, 2-3 times a day. 4.  Recommend the patient buy over-the-counter recta-care cream with lidocaine as needed for pain. 5.  Continue current medications for UC 6.  Patient to follow in clinic per Dr. Blanch Media recommendations after time of colonoscopy  Ellouise Newer, PA-C [facility] 06/04/2017, 4:15 PM  Cc: [name]*

## 2017-06-04 NOTE — Telephone Encounter (Signed)
Patient states he has been bleeding for the last 3 days and would like to speak to the nurse.

## 2017-06-04 NOTE — Telephone Encounter (Signed)
Pt states he has been having rectal bleeding for the past 3 days. He was supposed to be seen but missed that appt due to the Flu. Pt scheduled to see Ellouise Newer PA today at 3:15pm. Pt aware of appt.

## 2017-06-05 NOTE — Progress Notes (Signed)
Assessment and plans reviewed  

## 2017-07-07 ENCOUNTER — Telehealth: Payer: Self-pay

## 2017-07-07 NOTE — Telephone Encounter (Signed)
Pt states he spoke with pharmacy again and they will have prep tomorrow for him to go pick up.

## 2017-07-09 ENCOUNTER — Encounter: Payer: Self-pay | Admitting: Internal Medicine

## 2017-07-09 ENCOUNTER — Other Ambulatory Visit: Payer: Self-pay

## 2017-07-09 ENCOUNTER — Ambulatory Visit (AMBULATORY_SURGERY_CENTER): Payer: Medicare Other | Admitting: Internal Medicine

## 2017-07-09 VITALS — BP 128/79 | HR 67 | Temp 98.6°F | Resp 10 | Ht 70.0 in | Wt 261.0 lb

## 2017-07-09 DIAGNOSIS — K5190 Ulcerative colitis, unspecified, without complications: Secondary | ICD-10-CM

## 2017-07-09 DIAGNOSIS — K529 Noninfective gastroenteritis and colitis, unspecified: Secondary | ICD-10-CM | POA: Diagnosis not present

## 2017-07-09 DIAGNOSIS — Z86010 Personal history of colonic polyps: Secondary | ICD-10-CM

## 2017-07-09 DIAGNOSIS — K5150 Left sided colitis without complications: Secondary | ICD-10-CM | POA: Diagnosis not present

## 2017-07-09 MED ORDER — SODIUM CHLORIDE 0.9 % IV SOLN: 500.0000 mL | INTRAVENOUS | Status: DC

## 2017-07-09 NOTE — Progress Notes (Signed)
Pt's states no medical or surgical changes since previsit or office visit. 

## 2017-07-09 NOTE — Progress Notes (Signed)
Report to PACU, RN, vss, BBS= Clear.  

## 2017-07-09 NOTE — Op Note (Signed)
[facility] Patient Name: Bill Fox Procedure Date: 07/09/2017 8:12 AM MRN: [id number] Endoscopist: Docia Chuck. Henrene Pastor , MD Age: 77 Referring MD:  Date of Birth: 12-26-1940 Gender: Male Account #: [id number] Procedure:                Colonoscopy, With biopsies Indications:              High risk colon cancer surveillance: Ulcerative                            left sided colitis of 15 (or more) years duration.                            Also history of non-advanced adenomas. Last                            examination September 2016 Medicines:                Monitored Anesthesia Care Procedure:                Pre-Anesthesia Assessment:                           - Prior to the procedure, a History and Physical                            was performed, and patient medications and                            allergies were reviewed. The patient's tolerance of                            previous anesthesia was also reviewed. The risks                            and benefits of the procedure and the sedation                            options and risks were discussed with the patient.                            All questions were answered, and informed consent                            was obtained. Prior Anticoagulants: The patient has                            taken no previous anticoagulant or antiplatelet                            agents. ASA Grade Assessment: III - A patient with                            severe systemic disease. After reviewing the risks  and benefits, the patient was deemed in                            satisfactory condition to undergo the procedure.                           After obtaining informed consent, the colonoscope                            was passed under direct vision. Throughout the                            procedure, the patient's blood pressure, pulse, and                            oxygen saturations were  monitored continuously. The                            Colonoscope was introduced through the anus and                            advanced to the the cecum, identified by                            appendiceal orifice and ileocecal valve. The                            ileocecal valve, appendiceal orifice, and rectum                            were photographed. The quality of the bowel                            preparation was excellent. The colonoscopy was                            performed without difficulty. The patient tolerated                            the procedure well. The bowel preparation used was                            SUPREP. Scope In: 8:37:20 AM Scope Out: 8:53:29 AM Scope Withdrawal Time: 0 hours 13 minutes 20 seconds  Total Procedure Duration: 0 hours 16 minutes 9 seconds  Findings:                 There was subtle scarring and slight loss of                            features in the left colon. No active colitis.The                            entire examined colon appeared Otherwise normal on  direct and retroflexion views. Biopsies were taken                            with a cold forceps for histology. Four biopsies                            were taken every 10 cm with a cold forceps from the                            left colon, sigmoid colon and rectum for ulcerative                            colitis surveillance. These biopsy specimens were                            sent to Pathology. There are small hemorrhoids on                            retroflexed view. Complications:            No immediate complications. Estimated blood loss:                            None. Estimated Blood Loss:     Estimated blood loss: none. Impression:               - The entire examined colon is Essentially normal                            on direct and retroflexion views. Surveillance                            biopsies  obtained. Recommendation:           - Repeat colonoscopy in 3 years for surveillance.                           - Patient has a contact number available for                            emergencies. The signs and symptoms of potential                            delayed complications were discussed with the                            patient. Return to normal activities tomorrow.                            Written discharge instructions were provided to the                            patient.                           - Resume previous diet.                           -  Continue present medications.                           - Await pathology results. Docia Chuck. Henrene Pastor, MD 07/09/2017 9:22:36 AM This report has been signed electronically.

## 2017-07-09 NOTE — Progress Notes (Signed)
Called to room to assist during endoscopic procedure.  Patient ID and intended procedure confirmed with present staff. Received instructions for my participation in the procedure from the performing physician.  

## 2017-07-09 NOTE — Patient Instructions (Signed)
Discharge instructions given. Biopsies taken. Resume previous medications. YOU HAD AN ENDOSCOPIC PROCEDURE TODAY AT [facility]:   Refer to the procedure report that was given to you for any specific questions about what was found during the examination.  If the procedure report does not answer your questions, please call your gastroenterologist to clarify.  If you requested that your care partner not be given the details of your procedure findings, then the procedure report has been included in a sealed envelope for you to review at your convenience later.  YOU SHOULD EXPECT: Some feelings of bloating in the abdomen. Passage of more gas than usual.  Walking can help get rid of the air that was put into your GI tract during the procedure and reduce the bloating. If you had a lower endoscopy (such as a colonoscopy or flexible sigmoidoscopy) you may notice spotting of blood in your stool or on the toilet paper. If you underwent a bowel prep for your procedure, you may not have a normal bowel movement for a few days.  Please Note:  You might notice some irritation and congestion in your nose or some drainage.  This is from the oxygen used during your procedure.  There is no need for concern and it should clear up in a day or so.  SYMPTOMS TO REPORT IMMEDIATELY:   Following lower endoscopy (colonoscopy or flexible sigmoidoscopy):  Excessive amounts of blood in the stool  Significant tenderness or worsening of abdominal pains  Swelling of the abdomen that is new, acute  Fever of 100F or higher   For urgent or emergent issues, a gastroenterologist can be reached at any hour by calling [phone].   DIET:  We do recommend a small meal at first, but then you may proceed to your regular diet.  Drink plenty of fluids but you should avoid alcoholic beverages for 24 hours.  ACTIVITY:  You should plan to take it easy for the rest of today and you should NOT DRIVE or use heavy  machinery until tomorrow (because of the sedation medicines used during the test).    FOLLOW UP: Our staff will call the number listed on your records the next business day following your procedure to check on you and address any questions or concerns that you may have regarding the information given to you following your procedure. If we do not reach you, we will leave a message.  However, if you are feeling well and you are not experiencing any problems, there is no need to return our call.  We will assume that you have returned to your regular daily activities without incident.  If any biopsies were taken you will be contacted by phone or by letter within the next 1-3 weeks.  Please call us at [phone] if you have not heard about the biopsies in 3 weeks.    SIGNATURES/CONFIDENTIALITY: You and/or your care partner have signed paperwork which will be entered into your electronic medical record.  These signatures attest to the fact that that the information above on your After Visit Summary has been reviewed and is understood.  Full responsibility of the confidentiality of this discharge information lies with you and/or your care-partner.

## 2017-07-10 ENCOUNTER — Telehealth: Payer: Self-pay | Admitting: *Deleted

## 2017-07-10 ENCOUNTER — Telehealth: Payer: Self-pay

## 2017-07-10 NOTE — Telephone Encounter (Signed)
  Follow up Call-  Call back number 07/09/2017 01/27/2015  Post procedure Call Back phone  # [phone]  Permission to leave phone message Yes Yes  Some recent data might be hidden     Patient questions:  Do you have a fever, pain , or abdominal swelling? No. Pain Score  0 *  Have you tolerated food without any problems? Yes.    Have you been able to return to your normal activities? Yes.    Do you have any questions about your discharge instructions: Diet   No. Medications  No. Follow up visit  No.  Do you have questions or concerns about your Care? No.  Actions: * If pain score is 4 or above: No action needed, pain <4.

## 2017-07-10 NOTE — Telephone Encounter (Signed)
No answer, message left for the patient. 

## 2017-07-14 ENCOUNTER — Encounter: Payer: Self-pay | Admitting: Internal Medicine

## 2017-07-25 ENCOUNTER — Telehealth: Payer: Self-pay | Admitting: Internal Medicine

## 2017-07-29 MED ORDER — MESALAMINE 400 MG PO CPDR: DELAYED_RELEASE_CAPSULE | ORAL | 1 refills | Status: DC

## 2017-07-29 MED ORDER — MERCAPTOPURINE 50 MG PO TABS: ORAL_TABLET | ORAL | 3 refills | Status: DC

## 2017-07-29 NOTE — Telephone Encounter (Signed)
Refilled 6 mp and Delzicol

## 2017-07-31 NOTE — Telephone Encounter (Signed)
Pt states he is out of Delzicol and will not be able to get any for 2 weeks and is asking if this will be okay for him to wait to take medication this long.

## 2017-08-01 NOTE — Telephone Encounter (Signed)
Received sample of Delzicol which patient came and picked up

## 2017-08-05 ENCOUNTER — Other Ambulatory Visit: Payer: Self-pay | Admitting: Family Medicine

## 2017-08-05 NOTE — Telephone Encounter (Signed)
Copied from [place]. Topic: Quick Communication - Rx Refill/Question >> Aug 05, 2017  9:43 AM Scherrie Gerlach wrote: Medication:  mirtazapine (REMERON) 30 MG tablet                       doxazosin (CARDURA) 8 MG tablet Pt used to see Dr Melvyn Novas for these rx, but no longer sees him  Pt  now see Dr Nani Ravens.  Would like a refill  90 day sent to  [place], [place] [phone] (Phone) [phone] (Fax)

## 2017-08-05 NOTE — Telephone Encounter (Signed)
Verify with Gwynne Edinger who he used to get the Cardura from. I don't have an issue calling in either one of these, though. TY.

## 2017-08-05 NOTE — Telephone Encounter (Signed)
Dr Nani Ravens-- please advise. I don't even see Cardura on pt's med list?

## 2017-08-06 NOTE — Telephone Encounter (Signed)
Called the patient left message to call back.

## 2017-08-07 NOTE — Telephone Encounter (Signed)
Called the patient left message to call back.

## 2017-08-08 NOTE — Telephone Encounter (Signed)
Called the no answer /no voice mail

## 2017-08-11 ENCOUNTER — Other Ambulatory Visit: Payer: Self-pay

## 2017-08-11 MED ORDER — DOXAZOSIN MESYLATE 8 MG PO TABS: 8.0000 mg | ORAL_TABLET | Freq: Every day | ORAL | 1 refills | Status: DC

## 2017-08-11 MED ORDER — MIRTAZAPINE 30 MG PO TABS: ORAL_TABLET | ORAL | 1 refills | Status: DC

## 2017-08-11 NOTE — Telephone Encounter (Signed)
Talked to patient and he reported he got Cardura from Dr. Melvyn Novas at [facility] but he is transferring all care to [facility]. Ok per Dr. Nani Ravens prescriptions will be sent to his home [facility].

## 2017-08-22 ENCOUNTER — Telehealth: Payer: Self-pay | Admitting: Family Medicine

## 2017-08-22 NOTE — Telephone Encounter (Signed)
Copied from [place] [id number]. Topic: Quick Communication - See Telephone Encounter >> Aug 22, 2017 10:11 AM Bill Fox wrote: CRM for notification. See Telephone encounter for: 08/22/17.  Left voicemail appt needs to be rescheduled per pcp

## 2017-08-27 NOTE — Progress Notes (Signed)
Subjective:   Bill Fox is a 77 y.o. male who presents for an Initial Medicare Annual Wellness Visit. Still works part time as Furniture conservator/restorer.   Review of Systems No ROS.  Medicare Wellness Visit.+ Additional risk factors are reflected in the social history.  Cardiac Risk Factors include: advanced age (>85mn, >>41women);dyslipidemia;hypertension;male gender Sleep patterns: " I am a great sleeper" Home Safety/Smoke Alarms: Feels safe in home. Smoke alarms in place.  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 Seat Belt Safety/Bike Helmet: Wears seat belt.  Male:   CC[facility]-   07/09/17. Recall 3 yrs  PSA-  Lab Results  Component Value Date   PSA 0.78 06/28/2010   Eye-hx cataract surgery. Eye doctor as needed. Wears bifocals.   Objective:    Today's Vitals   09/01/17 0943  BP: 134/80  Pulse: 90  SpO2: 93%  Weight: 265 lb (120.2 kg)  Height: 5' 10"  (1.778 m)   Body mass index is 38.02 kg/m.  Advanced Directives 09/01/2017 07/12/2015 05/01/2015 01/27/2015 01/13/2015 12/03/2013  Does Patient Have a Medical Advance Directive? No No No Yes No Patient does not have advance directive;Patient would like information  Would patient like information on creating a medical advance directive? Yes (MAU/Ambulatory/Procedural Areas - Information given) No - patient declined information - - No - patient declined information Advance directive packet given    Current Medications (verified) Outpatient Encounter Medications as of 09/01/2017  Medication Sig  . AMBULATORY NON FORMULARY MEDICATION Medication Name: Nitroglycerin ointment 0.125 mg mixed with 5% lidocaine three times a day for 6-8 weeks.  . mercaptopurine (PURINETHOL) 50 MG tablet TAKE 3 TABLETS DAILY ON AN EMPTY STOMACH 1 HOUR BEFORE OR 2 HOURS AFTER A MEAL (NEED OFFICE VISIT FOR FURTHER REFILLS)  . Mesalamine (DELZICOL) 400 MG CPDR DR capsule TAKE 4 CAPSULES THREE TIMES A DAY  . mirtazapine (REMERON) 30  MG tablet TAKE 1 TABLET AT BEDTIME  . [DISCONTINUED] doxazosin (CARDURA) 8 MG tablet Take 1 tablet (8 mg total) by mouth daily.  . [DISCONTINUED] 0.9 %  sodium chloride infusion    No facility-administered encounter medications on file as of 09/01/2017.     Allergies (verified) Patient has no known allergies.   History: Past Medical History:  Diagnosis Date  . Anxiety   . At risk for sleep apnea    STOP-BANG= 5     SENT TO PCP 11-29-2013  . Chronic fatigue   . DJD (degenerative joint disease)    RIGHT KNEE  . History of colon polyps    2011  &  2013 --  ADENOMATOUS  . History of pulmonary embolism   . Hyperlipidemia    pt denies ever having high chol  . Hypertension   . Right knee meniscal tear   . Ulcerative colitis   . Wears glasses    Past Surgical History:  Procedure Laterality Date  . CATARACT EXTRACTION W/ INTRAOCULAR LENS IMPLANT Left 2014  . COLONOSCOPY W/ POLYPECTOMY  MULTIPLE--  LAST ONE 09-12-2011  . KNEE ARTHROSCOPY WITH DRILLING/MICROFRACTURE Right 12/03/2013   Procedure: RIGHT KNEE ARTHROSCOPY PARTIAL MEDIAL MENISCECTOMY DEBRIDEMENT SYNOVECTOMY;  Surgeon: JJohnn Hai MD;  Location: [facility]  Service: Orthopedics;  Laterality: Right;  . NASAL FRACTURE SURGERY  1985  . TRANSTHORACIC ECHOCARDIOGRAM  11-01-2008   MILD LVH/  EF 572%/ GRADE I DIASTOLIC DYSFUNCTION/  MILD LAE   Family History  Problem Relation Age of Onset  . Diabetes Mother   .  Cancer Father        unknown type  . Stomach cancer Father   . Lung cancer Brother   . Diabetes Paternal Uncle        x 2  . Colon cancer Neg Hx    Social History   Socioeconomic History  . Marital status: Married    Spouse name: Not on file  . Number of children: Not on file  . Years of education: Not on file  . Highest education level: Not on file  Occupational History  . Occupation: Armed forces operational officer  Social Needs  . Financial resource strain: Not on file  . Food insecurity:    Worry:  Not on file    Inability: Not on file  . Transportation needs:    Medical: Not on file    Non-medical: Not on file  Tobacco Use  . Smoking status: Former Smoker    Packs/day: 1.00    Years: 30.00    Pack years: 30.00    Types: Cigarettes    Last attempt to quit: 05/20/1993    Years since quitting: 24.3  . Smokeless tobacco: Never Used  Substance and Sexual Activity  . Alcohol use: Yes    Alcohol/week: 0.6 oz    Types: 1 Cans of beer per week    Comment: beer on occ  . Drug use: No  . Sexual activity: Yes  Lifestyle  . Physical activity:    Days per week: Not on file    Minutes per session: Not on file  . Stress: Not on file  Relationships  . Social connections:    Talks on phone: Not on file    Gets together: Not on file    Attends religious service: Not on file    Active member of club or organization: Not on file    Attends meetings of clubs or organizations: Not on file    Relationship status: Not on file  Other Topics Concern  . Not on file  Social History Narrative  . Not on file   Tobacco Counseling Counseling given: Not Answered   Clinical Intake:     Pain : No/denies pain                 Activities of Daily Living In your present state of health, do you have any difficulty performing the following activities: 09/01/2017  Hearing? N  Vision? N  Comment wears bifocals.   Difficulty concentrating or making decisions? N  Walking or climbing stairs? N  Dressing or bathing? N  Doing errands, shopping? N  Preparing Food and eating ? N  Using the Toilet? N  In the past six months, have you accidently leaked urine? N  Do you have problems with loss of bowel control? N  Managing your Medications? N  Managing your Finances? N  Housekeeping or managing your Housekeeping? N  Some recent data might be hidden     Immunizations and Health Maintenance Immunization History  Administered Date(s) Administered  . Influenza Split 02/18/2016  .  Influenza Whole 02/16/2009, 01/31/2010  . Influenza,inj,Quad PF,6+ Mos 03/03/2014, 01/30/2015  . Pneumococcal Conjugate-13 01/30/2015  . Pneumococcal Polysaccharide-23 06/20/2008  . Tdap 09/03/2011   There are no preventive care reminders to display for this patient.  Patient Care Team: [name], DO as PCP - General (Family Medicine)  Indicate any recent Medical Services you may have received from other than [facility] providers in the past year (date may be approximate).  Assessment:   This is a routine wellness examination for Omarian. Physical assessment deferred to PCP.  Hearing/Vision screen  Visual Acuity Screening   Right eye Left eye Both eyes  Without correction: 20/25 20/25 20/25   With correction:     Hearing Screening Comments: Able to hear conversational tones w/o difficulty. No issues reported.  Passed whisper test.   Dietary issues and exercise activities discussed: Current Exercise Habits: Home exercise routine, Type of exercise: treadmill, Time (Minutes): 30, Frequency (Times/Week): 4, Weekly Exercise (Minutes/Week): 120, Intensity: Mild, Exercise limited by: None identified   Diet (meal preparation, eat out, water intake, caffeinated beverages, dairy products, fruits and vegetables): 24 hour recall Breakfast: biscuit and coffee Lunch: sausages and banana Dinner:  Burger and potatoes    Goals    . Weight (lb) < 235 lb (106.6 kg) (pt-stated)     Eating well and exercising      Depression Screen PHQ 2/9 Scores 09/01/2017  PHQ - 2 Score 0    Fall Risk Fall Risk  09/01/2017  Falls in the past year? No     Cognitive Function: MMSE - Mini Mental State Exam 09/01/2017  Orientation to time 5  Orientation to Place 5  Registration 3  Attention/ Calculation 5  Recall 3  Language- name 2 objects 2  Language- repeat 1  Language- follow 3 step command 3  Language- read & follow direction 1  Write a sentence 1  Copy design 1  Total score 30          Screening Tests Health Maintenance  Topic Date Due  . INFLUENZA VACCINE  12/18/2017  . COLONOSCOPY  07/09/2020  . TETANUS/TDAP  09/02/2021  . PNA vac Low Risk Adult  Completed        Plan:     Eat heart healthy diet (full of fruits, vegetables, whole grains, lean protein, water--limit salt, fat, and sugar intake) and increase physical activity as tolerated.  Bring a copy of your living will and/or healthcare power of attorney to your next office visit.    I have personally reviewed and noted the following in the patient's chart:   . Medical and social history . Use of alcohol, tobacco or illicit drugs  . Current medications and supplements . Functional ability and status . Nutritional status . Physical activity . Advanced directives . List of other physicians . Hospitalizations, surgeries, and ER visits in previous 12 months . Vitals . Screenings to include cognitive, depression, and falls . Referrals and appointments  In addition, I have reviewed and discussed with patient certain preventive protocols, quality metrics, and best practice recommendations. A written personalized care plan for preventive services as well as general preventive health recommendations were provided to patient.     Shela Nevin, [place]   09/01/2017

## 2017-08-28 ENCOUNTER — Ambulatory Visit: Payer: Medicare Other | Admitting: Family Medicine

## 2017-08-28 ENCOUNTER — Ambulatory Visit: Payer: Medicare Other | Admitting: *Deleted

## 2017-09-01 ENCOUNTER — Encounter: Payer: Self-pay | Admitting: *Deleted

## 2017-09-01 ENCOUNTER — Ambulatory Visit (INDEPENDENT_AMBULATORY_CARE_PROVIDER_SITE_OTHER): Payer: Medicare Other | Admitting: *Deleted

## 2017-09-01 ENCOUNTER — Ambulatory Visit (INDEPENDENT_AMBULATORY_CARE_PROVIDER_SITE_OTHER): Payer: Medicare Other | Admitting: Family Medicine

## 2017-09-01 ENCOUNTER — Encounter: Payer: Self-pay | Admitting: Family Medicine

## 2017-09-01 VITALS — BP 134/80 | HR 90 | Temp 98.5°F | Ht 70.0 in | Wt 265.5 lb

## 2017-09-01 VITALS — BP 134/80 | HR 90 | Ht 70.0 in | Wt 265.0 lb

## 2017-09-01 DIAGNOSIS — G4700 Insomnia, unspecified: Secondary | ICD-10-CM | POA: Diagnosis not present

## 2017-09-01 DIAGNOSIS — Z0000 Encounter for general adult medical examination without abnormal findings: Secondary | ICD-10-CM | POA: Diagnosis not present

## 2017-09-01 DIAGNOSIS — I10 Essential (primary) hypertension: Secondary | ICD-10-CM | POA: Diagnosis not present

## 2017-09-01 NOTE — Progress Notes (Signed)
Pre visit review using our clinic review tool, if applicable. No additional management support is needed unless otherwise documented below in the visit note. 

## 2017-09-01 NOTE — Progress Notes (Signed)
Chief Complaint  Patient presents with  . Follow-up    Subjective Bill Fox is a 77 y.o. male who presents for hypertension follow up. He does monitor home blood pressures. Blood pressures ranging from 130's/80's on average. He is compliant with medication- Cardura 4 mg intermittently. Patient has these side effects of medication: none He is not adhering to a healthy diet overall. Current exercise: walking  Patient is doing well with his anxiety and is controlled on Remeron.  No adverse effects with this medicine.   Past Medical History:  Diagnosis Date  . Anxiety   . At risk for sleep apnea    STOP-BANG= 5     SENT TO PCP 11-29-2013  . Chronic fatigue   . DJD (degenerative joint disease)    RIGHT KNEE  . History of colon polyps    2011  &  2013 --  ADENOMATOUS  . History of pulmonary embolism   . Hyperlipidemia    pt denies ever having high chol  . Hypertension   . Right knee meniscal tear   . Ulcerative colitis   . Wears glasses     Exam BP 134/80 (BP Location: Left Arm, Patient Position: Sitting, Cuff Size: Large)   Pulse 90   Temp 98.5 F (36.9 C) (Oral)   Ht 5' 10"  (1.778 m)   Wt 265 lb 8 oz (120.4 kg)   SpO2 93%   BMI 38.10 kg/m  General:  well developed, well nourished, in no apparent distress Eyes: pupils equal and round, sclera anicteric without injection Heart: RRR, no bruits, no LE edema Lungs: clear to auscultation, no accessory muscle use Psych: well oriented with normal range of affect and appropriate judgment/insight  Essential hypertension  Insomnia, unspecified type  We will stop his intermittent use of Cardura.  He will continue to check his blood pressures at home and let us know if it starts to increase. Continue mirtazapine. Counseled on diet and exercise. F/u in in 6 mo or as needed. The patient voiced understanding and agreement to the plan.  [place], DO 09/01/17  10:15 AM

## 2017-09-01 NOTE — Patient Instructions (Addendum)
Eat heart healthy diet (full of fruits, vegetables, whole grains, lean protein, water--limit salt, fat, and sugar intake) and increase physical activity as tolerated.  Bring a copy of your living will and/or healthcare power of attorney to your next office visit.  Please schedule to see me back in 31yr Bill Fox, Thank you for taking time to come for your Medicare Wellness Visit. I appreciate your ongoing commitment to your health goals. Please review the following plan we discussed and let me know if I can assist you in the future.   These are the goals we discussed: Goals    . Weight (lb) < 235 lb (106.6 kg) (pt-stated)     Eating well and exercising       This is a list of the screening recommended for you and due dates:  Health Maintenance  Topic Date Due  . Flu Shot  12/18/2017  . Colon Cancer Screening  07/09/2020  . Tetanus Vaccine  09/02/2021  . Pneumonia vaccines  Completed    Health Maintenance, Male A healthy lifestyle and preventive care is important for your health and wellness. Ask your health care provider about what schedule of regular examinations is right for you. What should I know about weight and diet? Eat a Healthy Diet  Eat plenty of vegetables, fruits, whole grains, low-fat dairy products, and lean protein.  Do not eat a lot of foods high in solid fats, added sugars, or salt.  Maintain a Healthy Weight Regular exercise can help you achieve or maintain a healthy weight. You should:  Do at least 150 minutes of exercise each week. The exercise should increase your heart rate and make you sweat (moderate-intensity exercise).  Do strength-training exercises at least twice a week.  Watch Your Levels of Cholesterol and Blood Lipids  Have your blood tested for lipids and cholesterol every 5 years starting at 75years of age. If you are at high risk for heart disease, you should start having your blood tested when you are 75years old. You may need to have  your cholesterol levels checked more often if: ? Your lipid or cholesterol levels are high. ? You are older than 75years of age. ? You are at high risk for heart disease.  What should I know about cancer screening? Many types of cancers can be detected early and may often be prevented. Lung Cancer  You should be screened every year for lung cancer if: ? You are a current smoker who has smoked for at least 30 years. ? You are a former smoker who has quit within the past 15 years.  Talk to your health care provider about your screening options, when you should start screening, and how often you should be screened.  Colorectal Cancer  Routine colorectal cancer screening usually begins at 5107years of age and should be repeated every 5-10 years until you are 75years old. You may need to be screened more often if early forms of precancerous polyps or small growths are found. Your health care provider may recommend screening at an earlier age if you have risk factors for colon cancer.  Your health care provider may recommend using home test kits to check for hidden blood in the stool.  A small camera at the end of a tube can be used to examine your colon (sigmoidoscopy or colonoscopy). This checks for the earliest forms of colorectal cancer.  Prostate and Testicular Cancer  Depending on your age and overall  health, your health care provider may do certain tests to screen for prostate and testicular cancer.  Talk to your health care provider about any symptoms or concerns you have about testicular or prostate cancer.  Skin Cancer  Check your skin from head to toe regularly.  Tell your health care provider about any new moles or changes in moles, especially if: ? There is a change in a mole's size, shape, or color. ? You have a mole that is larger than a pencil eraser.  Always use sunscreen. Apply sunscreen liberally and repeat throughout the day.  Protect yourself by wearing long  sleeves, pants, a wide-brimmed hat, and sunglasses when outside.  What should I know about heart disease, diabetes, and high blood pressure?  If you are 92-19 years of age, have your blood pressure checked every 3-5 years. If you are 64 years of age or older, have your blood pressure checked every year. You should have your blood pressure measured twice-once when you are at a hospital or clinic, and once when you are not at a hospital or clinic. Record the average of the two measurements. To check your blood pressure when you are not at a hospital or clinic, you can use: ? An automated blood pressure machine at a pharmacy. ? A home blood pressure monitor.  Talk to your health care provider about your target blood pressure.  If you are between 26-46 years old, ask your health care provider if you should take aspirin to prevent heart disease.  Have regular diabetes screenings by checking your fasting blood sugar level. ? If you are at a normal weight and have a low risk for diabetes, have this test once every three years after the age of 62. ? If you are overweight and have a high risk for diabetes, consider being tested at a younger age or more often.  A one-time screening for abdominal aortic aneurysm (AAA) by ultrasound is recommended for men aged 29-75 years who are current or former smokers. What should I know about preventing infection? Hepatitis B If you have a higher risk for hepatitis B, you should be screened for this virus. Talk with your health care provider to find out if you are at risk for hepatitis B infection. Hepatitis C Blood testing is recommended for:  Everyone born from [birth year] through 1963.  Anyone with known risk factors for hepatitis C.  Sexually Transmitted Diseases (STDs)  You should be screened each year for STDs including gonorrhea and chlamydia if: ? You are sexually active and are younger than 75 years of age. ? You are older than 75 years of age and your  health care provider tells you that you are at risk for this type of infection. ? Your sexual activity has changed since you were last screened and you are at an increased risk for chlamydia or gonorrhea. Ask your health care provider if you are at risk.  Talk with your health care provider about whether you are at high risk of being infected with HIV. Your health care provider may recommend a prescription medicine to help prevent HIV infection.  What else can I do?  Schedule regular health, dental, and eye exams.  Stay current with your vaccines (immunizations).  Do not use any tobacco products, such as cigarettes, chewing tobacco, and e-cigarettes. If you need help quitting, ask your health care provider.  Limit alcohol intake to no more than 2 drinks per day. One drink equals 12 ounces of beer, 5  ounces of wine, or 1 ounces of hard liquor.  Do not use street drugs.  Do not share needles.  Ask your health care provider for help if you need support or information about quitting drugs.  Tell your health care provider if you often feel depressed.  Tell your health care provider if you have ever been abused or do not feel safe at home. This information is not intended to replace advice given to you by your health care provider. Make sure you discuss any questions you have with your health care provider. Document Released: 11/02/2007 Document Revised: 01/03/2016 Document Reviewed: 02/07/2015 Elsevier Interactive Patient Education  [employer].

## 2017-09-01 NOTE — Progress Notes (Signed)
Noted. Agree with above.  [place], DO 09/01/17 12:00 PM

## 2017-09-01 NOTE — Patient Instructions (Signed)
Ok to come off of doxazosin. Check blood pressures and if still elevated, we can go back on it.   Keep up the good work.  Let me know if you knee pain bothers you enough to to an injection, we can do those here.   Let us know if you need anything.

## 2017-10-15 ENCOUNTER — Ambulatory Visit (INDEPENDENT_AMBULATORY_CARE_PROVIDER_SITE_OTHER): Payer: Medicare Other | Admitting: Medical

## 2017-10-15 ENCOUNTER — Ambulatory Visit: Payer: Self-pay

## 2017-10-15 ENCOUNTER — Encounter: Payer: Self-pay | Admitting: Medical

## 2017-10-15 ENCOUNTER — Telehealth: Payer: Self-pay | Admitting: Medical

## 2017-10-15 ENCOUNTER — Ambulatory Visit: Payer: Medicare Other | Admitting: Family Medicine

## 2017-10-15 VITALS — BP 139/73 | HR 75 | Temp 98.1°F | Resp 16 | Ht 70.0 in | Wt 263.6 lb

## 2017-10-15 DIAGNOSIS — R42 Dizziness and giddiness: Secondary | ICD-10-CM | POA: Diagnosis not present

## 2017-10-15 DIAGNOSIS — R29818 Other symptoms and signs involving the nervous system: Secondary | ICD-10-CM | POA: Diagnosis not present

## 2017-10-15 DIAGNOSIS — I10 Essential (primary) hypertension: Secondary | ICD-10-CM | POA: Diagnosis not present

## 2017-10-15 MED ORDER — MECLIZINE HCL 12.5 MG PO TABS: 12.5000 mg | ORAL_TABLET | Freq: Three times a day (TID) | ORAL | 0 refills | Status: DC | PRN

## 2017-10-15 NOTE — Telephone Encounter (Signed)
Pt. Reports lightheadedness x 1 week ago. Reports Dr. Nani Ravens had stopped his Cardura, "but I restarted it because my BP was running high - 159/89."  BP this morning  140/87 pulse 71. Dizziness and lightheadedness "is getting worse." Appointment made for today. No availability with Dr.Wendling. Reason for Disposition . Taking a medicine that could cause dizziness (e.g., blood pressure medications, diuretics)  Answer Assessment - Initial Assessment Questions 1. DESCRIPTION: "Describe your dizziness."     Lightheadedness 2. LIGHTHEADED: "Do you feel lightheaded?" (e.g., somewhat faint, woozy, weak upon standing)     All the time 3. VERTIGO: "Do you feel like either you or the room is spinning or tilting?" (i.e. vertigo)     No 4. SEVERITY: "How bad is it?"  "Do you feel like you are going to faint?" "Can you stand and walk?"   - MILD - walking normally   - MODERATE - interferes with normal activities (e.g., work, school)    - SEVERE - unable to stand, requires support to walk, feels like passing out now.      Mild 5. ONSET:  "When did the dizziness begin?"     Started 1 week ago 6. AGGRAVATING FACTORS: "Does anything make it worse?" (e.g., standing, change in head position)     No 7. HEART RATE: "Can you tell me your heart rate?" "How many beats in 15 seconds?"  (Note: not all patients can do this)       71 8. CAUSE: "What do you think is causing the dizziness?"     Unsure 9. RECURRENT SYMPTOM: "Have you had dizziness before?" If so, ask: "When was the last time?" "What happened that time?"     No 10. OTHER SYMPTOMS: "Do you have any other symptoms?" (e.g., fever, chest pain, vomiting, diarrhea, bleeding)       No 11. PREGNANCY: "Is there any chance you are pregnant?" "When was your last menstrual period?"       N/A  Protocols used: DIZZINESS [facility]

## 2017-10-15 NOTE — Progress Notes (Signed)
Subjective:    Patient ID: Bill Fox, male    DOB: 06-04-1940, 77 y.o.   MRN: [id number]  HPI  Pt in states he has been feeling light headed for about one week. He states he can function. He works full time. No gross motor or sensory funcition deficits.   Pt does have bp readings he brings in.  Pt is taking cardura daily 1/2 in morning and half in evening.  Pt started back on cardura about 10 days ago.  Pt has no ha, no nausea, no vomiting, no gross motor or sensory deficits.  Pt bp has been 448 systolic about 2/3 of the time before he start cardura on 10-05-2017. Since starting cardia blood pressure is less than 185 sytolic majority of the time. Diastolics are in 80 range.  Pt notes when he started cardura is when dizziness started.     Review of Systems  HENT: Negative for congestion, drooling, ear pain and facial swelling.   Respiratory: Negative for cough, chest tightness, shortness of breath and wheezing.   Cardiovascular: Negative for chest pain and palpitations.  Gastrointestinal: Negative for abdominal distention, abdominal pain and blood in stool.  Genitourinary: Negative for decreased urine volume, difficulty urinating, dysuria, flank pain, frequency, hematuria, penile pain, penile swelling, scrotal swelling and testicular pain.       Pt denies every having any urine flow issues. States really never had.  Neurological: Positive for dizziness. Negative for syncope, speech difficulty, weakness and light-headedness.       Faint light headed sensation.  Hematological: Negative for adenopathy. Does not bruise/bleed easily.  Psychiatric/Behavioral: Negative for behavioral problems and confusion.   Past Medical History:  Diagnosis Date  . Anxiety   . At risk for sleep apnea    STOP-BANG= 5     SENT TO PCP 11-29-2013  . Chronic fatigue   . DJD (degenerative joint disease)    RIGHT KNEE  . History of colon polyps    2011  &  2013 --  ADENOMATOUS  . History of  pulmonary embolism   . Hyperlipidemia    pt denies ever having high chol  . Hypertension   . Right knee meniscal tear   . Ulcerative colitis   . Wears glasses      Social History   Socioeconomic History  . Marital status: Married    Spouse name: Not on file  . Number of children: Not on file  . Years of education: Not on file  . Highest education level: Not on file  Occupational History  . Occupation: Armed forces operational officer  Social Needs  . Financial resource strain: Not on file  . Food insecurity:    Worry: Not on file    Inability: Not on file  . Transportation needs:    Medical: Not on file    Non-medical: Not on file  Tobacco Use  . Smoking status: Former Smoker    Packs/day: 1.00    Years: 30.00    Pack years: 30.00    Types: Cigarettes    Last attempt to quit: 05/20/1993    Years since quitting: 24.4  . Smokeless tobacco: Never Used  Substance and Sexual Activity  . Alcohol use: Yes    Alcohol/week: 0.6 oz    Types: 1 Cans of beer per week    Comment: beer on occ  . Drug use: No  . Sexual activity: Yes  Lifestyle  . Physical activity:    Days per week: Not on file  Minutes per session: Not on file  . Stress: Not on file  Relationships  . Social connections:    Talks on phone: Not on file    Gets together: Not on file    Attends religious service: Not on file    Active member of club or organization: Not on file    Attends meetings of clubs or organizations: Not on file    Relationship status: Not on file  . Intimate partner violence:    Fear of current or ex partner: Not on file    Emotionally abused: Not on file    Physically abused: Not on file    Forced sexual activity: Not on file  Other Topics Concern  . Not on file  Social History Narrative  . Not on file    Past Surgical History:  Procedure Laterality Date  . CATARACT EXTRACTION W/ INTRAOCULAR LENS IMPLANT Left 2014  . COLONOSCOPY W/ POLYPECTOMY  MULTIPLE--  LAST ONE 09-12-2011  . KNEE  ARTHROSCOPY WITH DRILLING/MICROFRACTURE Right 12/03/2013   Procedure: RIGHT KNEE ARTHROSCOPY PARTIAL MEDIAL MENISCECTOMY DEBRIDEMENT SYNOVECTOMY;  Surgeon: Johnn Hai, MD;  Location: [facility];  Service: Orthopedics;  Laterality: Right;  . NASAL FRACTURE SURGERY  1985  . TRANSTHORACIC ECHOCARDIOGRAM  11-01-2008   MILD LVH/  EF 85%/  GRADE I DIASTOLIC DYSFUNCTION/  MILD LAE    Family History  Problem Relation Age of Onset  . Diabetes Mother   . Cancer Father        unknown type  . Stomach cancer Father   . Lung cancer Brother   . Diabetes Paternal Uncle        x 2  . Colon cancer Neg Hx     No Known Allergies  Current Outpatient Medications on File Prior to Visit  Medication Sig Dispense Refill  . AMBULATORY NON FORMULARY MEDICATION Medication Name: Nitroglycerin ointment 0.125 mg mixed with 5% lidocaine three times a day for 6-8 weeks. 30 g 1  . mercaptopurine (PURINETHOL) 50 MG tablet TAKE 3 TABLETS DAILY ON AN EMPTY STOMACH 1 HOUR BEFORE OR 2 HOURS AFTER A MEAL (NEED OFFICE VISIT FOR FURTHER REFILLS) 270 tablet 3  . Mesalamine (DELZICOL) 400 MG CPDR DR capsule TAKE 4 CAPSULES THREE TIMES A DAY 1080 capsule 1  . mirtazapine (REMERON) 30 MG tablet TAKE 1 TABLET AT BEDTIME 90 tablet 1   No current facility-administered medications on file prior to visit.     BP 139/73   Pulse 75   Temp 98.1 F (36.7 C) (Oral)   Resp 16   Ht 5' 10"  (1.778 m)   Wt 263 lb 9.6 oz (119.6 kg)   SpO2 96%   BMI 37.82 kg/m        Objective:   Physical Exam  General Mental Status- Alert. General Appearance- Not in acute distress.   Skin General: Color- Normal Color. Moisture- Normal Moisture.  Neck Carotid Arteries- Normal color. Moisture- Normal Moisture. No carotid bruits. No JVD.  Chest and Lung Exam Auscultation: Breath Sounds:-Normal.  Cardiovascular Auscultation:Rythm- Regular. Murmurs & Other Heart Sounds:Auscultation of the heart reveals- No  Murmurs.  Abdomen Inspection:-Inspeection Normal. Palpation/Percussion:Note:No mass. Palpation and Percussion of the abdomen reveal- Non Tender, Non Distended + BS, no rebound or guarding.    Neurologic Cranial Nerve exam:- CN III-XII intact(No nystagmus), symmetric smile. Drift Test:- No drift. Romberg Exam:- poor heel to toe. Heal to Toe Gait exam:-Normal. Finger to Nose:- Normal/Intact Strength:- 5/5 equal and symmetric strength both upper  and lower extremities. On lying supine turning head to the left some vertigo. Lying to sitting transient dizziness.      Assessment & Plan:  For your constant lightheadedness over the last week with some mild vertigo on exam today, I am going to get CBC and metabolic panel.  Going to make meclizine available if you have severe lightheadedness/dizziness that lasts for more than 5 minutes.  If only transient sensation then would not recommend using the meclizine.  Also going to make Epley maneuvers available.  Copy of those are attached to sheet.  You can start these exercises.  Today decided to try to order CT of the head without contrast in light of your poor heel-to-toe testing.  I want you to keep checking your blood pressures daily.  Continue Cardura presently.  If above work-up is negative and if your dizziness persist then I think it might be a good idea to switch you off of Cardura and start you on amlodipine.  This might be a good idea in light of the fact that you do not really report any urinary symptoms currently nor did you report any in the past.  Follow-up in 7 days or as needed.  Pt was educated if he has any dizziness with gross motor or sensory function deficits then be seen in ED. Pt expressed understanding.  Mackie Pai, PA-C

## 2017-10-15 NOTE — Patient Instructions (Addendum)
For your constant lightheadedness over the last week with some mild vertigo on exam today, I am going to get CBC and metabolic panel.  Going to make meclizine available if you have severe lightheadedness/dizziness that lasts for more than 5 minutes.  If only transient sensation then would not recommend using the meclizine.  Also going to make Epley maneuvers available.  Copy of those are attached to this sheet.  You can start these exercises.  Today decided to try to order CT of the head without contrast in light of your poor heel-to-toe testing.  I want you to keep checking your blood pressures daily.  Continue Cardura presently.  If above work-up is negative and if your dizziness persist then I think it might be a good idea to switch you off of Cardura and start you on amlodipine.  This might be a good idea in light of the fact that you do not really report any urinary symptoms currently nor did you report any in the past.   Follow-up in 7 days or as needed.  How to Perform the Epley Maneuver The Epley maneuver is an exercise that relieves symptoms of vertigo. Vertigo is the feeling that you or your surroundings are moving when they are not. When you feel vertigo, you may feel like the room is spinning and have trouble walking. Dizziness is a little different than vertigo. When you are dizzy, you may feel unsteady or light-headed. You can do this maneuver at home whenever you have symptoms of vertigo. You can do it up to 3 times a day until your symptoms go away. Even though the Epley maneuver may relieve your vertigo for a few weeks, it is possible that your symptoms will return. This maneuver relieves vertigo, but it does not relieve dizziness. What are the risks? If it is done correctly, the Epley maneuver is considered safe. Sometimes it can lead to dizziness or nausea that goes away after a short time. If you develop other symptoms, such as changes in vision, weakness, or numbness, stop doing  the maneuver and call your health care provider. How to perform the Epley maneuver 1. Sit on the edge of a bed or table with your back straight and your legs extended or hanging over the edge of the bed or table. 2. Turn your head halfway toward the affected ear or side. 3. Lie backward quickly with your head turned until you are lying flat on your back. You may want to position a pillow under your shoulders. 4. Hold this position for 30 seconds. You may experience an attack of vertigo. This is normal. 5. Turn your head to the opposite direction until your unaffected ear is facing the floor. 6. Hold this position for 30 seconds. You may experience an attack of vertigo. This is normal. Hold this position until the vertigo stops. 7. Turn your whole body to the same side as your head. Hold for another 30 seconds. 8. Sit back up. You can repeat this exercise up to 3 times a day. Follow these instructions at home:  After doing the Epley maneuver, you can return to your normal activities.  Ask your health care provider if there is anything you should do at home to prevent vertigo. He or she may recommend that you: ? Keep your head raised (elevated) with two or more pillows while you sleep. ? Do not sleep on the side of your affected ear. ? Get up slowly from bed. ? Avoid sudden movements during  the day. ? Avoid extreme head movement, like looking up or bending over. Contact a health care provider if:  Your vertigo gets worse.  You have other symptoms, including: ? Nausea. ? Vomiting. ? Headache. Get help right away if:  You have vision changes.  You have a severe or worsening headache or neck pain.  You cannot stop vomiting.  You have new numbness or weakness in any part of your body. Summary  Vertigo is the feeling that you or your surroundings are moving when they are not.  The Epley maneuver is an exercise that relieves symptoms of vertigo.  If the Epley maneuver is done  correctly, it is considered safe. You can do it up to 3 times a day. This information is not intended to replace advice given to you by your health care provider. Make sure you discuss any questions you have with your health care provider. Document Released: 05/11/2013 Document Revised: 03/26/2016 Document Reviewed: 03/26/2016 Elsevier Interactive Patient Education  2017 [employer].

## 2017-10-15 NOTE — Telephone Encounter (Signed)
Will you work on getting ct of head authorized tomorrow. Let me know I you have any problems getting authorization.

## 2017-10-16 ENCOUNTER — Telehealth: Payer: Self-pay | Admitting: Family Medicine

## 2017-10-16 ENCOUNTER — Telehealth: Payer: Self-pay | Admitting: Medical

## 2017-10-16 LAB — CBC WITH DIFFERENTIAL/PLATELET
BASOS PCT: 0.6 % (ref 0.0–3.0)
Basophils Absolute: 0 10*3/uL (ref 0.0–0.1)
EOS PCT: 3 % (ref 0.0–5.0)
Eosinophils Absolute: 0.2 10*3/uL (ref 0.0–0.7)
HCT: 44.3 % (ref 39.0–52.0)
HEMOGLOBIN: 15.1 g/dL (ref 13.0–17.0)
LYMPHS ABS: 1.3 10*3/uL (ref 0.7–4.0)
Lymphocytes Relative: 19.2 % (ref 12.0–46.0)
MCHC: 34 g/dL (ref 30.0–36.0)
MCV: 90.5 fl (ref 78.0–100.0)
MONO ABS: 0.7 10*3/uL (ref 0.1–1.0)
MONOS PCT: 10 % (ref 3.0–12.0)
NEUTROS PCT: 67.2 % (ref 43.0–77.0)
Neutro Abs: 4.5 10*3/uL (ref 1.4–7.7)
Platelets: 192 10*3/uL (ref 150.0–400.0)
RBC: 4.9 Mil/uL (ref 4.22–5.81)
RDW: 14.1 % (ref 11.5–15.5)
WBC: 6.7 10*3/uL (ref 4.0–10.5)

## 2017-10-16 LAB — COMPREHENSIVE METABOLIC PANEL
ALT: 17 U/L (ref 0–53)
AST: 20 U/L (ref 0–37)
Albumin: 4.2 g/dL (ref 3.5–5.2)
Alkaline Phosphatase: 51 U/L (ref 39–117)
BUN: 18 mg/dL (ref 6–23)
CHLORIDE: 105 meq/L (ref 96–112)
CO2: 22 mEq/L (ref 19–32)
Calcium: 9.3 mg/dL (ref 8.4–10.5)
Creatinine, Ser: 1.04 mg/dL (ref 0.40–1.50)
GFR: 74.08 mL/min (ref >60.00)
GLUCOSE: 98 mg/dL (ref 70–99)
POTASSIUM: 4.1 meq/L (ref 3.5–5.1)
SODIUM: 138 meq/L (ref 135–145)
Total Bilirubin: 0.4 mg/dL (ref 0.2–1.2)
Total Protein: 7.8 g/dL (ref 6.0–8.3)

## 2017-10-16 NOTE — Telephone Encounter (Signed)
Copied from [place] [id number]. Topic: General - Other >> Oct 16, 2017  2:28 PM Yvette Rack wrote: Reason for CRM: pt calling about lab results

## 2017-10-16 NOTE — Telephone Encounter (Signed)
The above patient advised he doesn't think he needs CT of head as he is feeling better today. He will let us know within a week if he changes his mind and decides to get it done.   Radiology notified me of the above.

## 2017-10-17 NOTE — Telephone Encounter (Signed)
Notified pt of lab results 

## 2017-12-12 ENCOUNTER — Ambulatory Visit (HOSPITAL_BASED_OUTPATIENT_CLINIC_OR_DEPARTMENT_OTHER)
Admission: RE | Admit: 2017-12-12 | Discharge: 2017-12-12 | Disposition: A | Discharge status: Home / Self Care | Payer: Medicare Other | Source: Ambulatory Visit | Attending: Medical | Admitting: Medical

## 2017-12-12 DIAGNOSIS — J3489 Other specified disorders of nose and nasal sinuses: Secondary | ICD-10-CM | POA: Insufficient documentation

## 2017-12-12 DIAGNOSIS — R29818 Other symptoms and signs involving the nervous system: Secondary | ICD-10-CM | POA: Diagnosis not present

## 2017-12-12 DIAGNOSIS — R9082 White matter disease, unspecified: Secondary | ICD-10-CM | POA: Diagnosis not present

## 2017-12-12 DIAGNOSIS — I6782 Cerebral ischemia: Secondary | ICD-10-CM | POA: Insufficient documentation

## 2017-12-12 DIAGNOSIS — R42 Dizziness and giddiness: Secondary | ICD-10-CM | POA: Diagnosis not present

## 2017-12-12 DIAGNOSIS — G319 Degenerative disease of nervous system, unspecified: Secondary | ICD-10-CM | POA: Insufficient documentation

## 2017-12-14 ENCOUNTER — Telehealth: Payer: Self-pay | Admitting: Medical

## 2017-12-14 MED ORDER — AMOXICILLIN-POT CLAVULANATE 875-125 MG PO TABS: 1.0000 | ORAL_TABLET | Freq: Two times a day (BID) | ORAL | 0 refills | Status: DC

## 2017-12-14 NOTE — Telephone Encounter (Signed)
Rx augmentin sent to pt pharmacy.

## 2017-12-16 ENCOUNTER — Telehealth: Payer: Self-pay | Admitting: Family Medicine

## 2017-12-16 NOTE — Telephone Encounter (Signed)
Left message for patient to call back  

## 2017-12-16 NOTE — Telephone Encounter (Signed)
Copied from [place] [id number]. Topic: Quick Communication - Lab Results >> Dec 15, 2017  2:28 PM Hinton Dyer, [place] wrote:  Called patient to inform them of 12/12/17 lab results. When patient returns call, triage nurse may disclose results.

## 2017-12-30 ENCOUNTER — Other Ambulatory Visit: Payer: Self-pay | Admitting: Family Medicine

## 2017-12-30 MED ORDER — MIRTAZAPINE 30 MG PO TABS: ORAL_TABLET | ORAL | 1 refills | Status: DC

## 2017-12-30 NOTE — Telephone Encounter (Signed)
Copied from [place] [id number]. Topic: General - Other >> Dec 30, 2017  9:40 AM Lennox Solders wrote: Reason for CRM: pt is calling and he is waiting on [facility] scripts. Pt needs 2 wk supply of mirtazapine 30 mg [place] [facility] [place]

## 2017-12-30 NOTE — Telephone Encounter (Signed)
Refill for 14 day supply sent in.

## 2018-03-02 ENCOUNTER — Ambulatory Visit: Payer: Medicare Other | Admitting: Family Medicine

## 2018-03-11 ENCOUNTER — Encounter: Payer: Self-pay | Admitting: Family Medicine

## 2018-03-11 ENCOUNTER — Ambulatory Visit (INDEPENDENT_AMBULATORY_CARE_PROVIDER_SITE_OTHER): Payer: Medicare Other | Admitting: Family Medicine

## 2018-03-11 VITALS — BP 132/78 | HR 78 | Temp 98.4°F | Ht 72.0 in | Wt 273.5 lb

## 2018-03-11 DIAGNOSIS — Z23 Encounter for immunization: Secondary | ICD-10-CM

## 2018-03-11 DIAGNOSIS — G4700 Insomnia, unspecified: Secondary | ICD-10-CM

## 2018-03-11 DIAGNOSIS — I10 Essential (primary) hypertension: Secondary | ICD-10-CM | POA: Diagnosis not present

## 2018-03-11 DIAGNOSIS — E785 Hyperlipidemia, unspecified: Secondary | ICD-10-CM | POA: Diagnosis not present

## 2018-03-11 DIAGNOSIS — H04123 Dry eye syndrome of bilateral lacrimal glands: Secondary | ICD-10-CM | POA: Diagnosis not present

## 2018-03-11 NOTE — Progress Notes (Signed)
Pre visit review using our clinic review tool, if applicable. No additional management support is needed unless otherwise documented below in the visit note. 

## 2018-03-11 NOTE — Patient Instructions (Addendum)
Artificial tears like Refresh and Systane may be used for comfort. OK to get generic version. Generally people use them every 2-4 hours, but you can use them as much as you want because there is no medication in it.  Give [place] 2-3 business days to get the results of your labs back.   Goal weight loss: 40 lbs long term, 25 lbs until you see Angel.  Let us know if you need anything.  Healthy Eating Plan Many factors influence your heart health, including eating and exercise habits. Heart (coronary) risk increases with abnormal blood fat (lipid) levels. Heart-healthy meal planning includes limiting unhealthy fats, increasing healthy fats, and making other small dietary changes. This includes maintaining a healthy body weight to help keep lipid levels within a normal range.  WHAT IS MY PLAN?  Your health care provider recommends that you:  Drink a glass of water before meals to help with satiety.  Eat slowly.  An alternative to the water is to add Metamucil. This will help with satiety as well. It does contain calories, unlike water.  WHAT TYPES OF FAT SHOULD I CHOOSE?  Choose healthy fats more often. Choose monounsaturated and polyunsaturated fats, such as olive oil and canola oil, flaxseeds, walnuts, almonds, and seeds.  Eat more omega-3 fats. Good choices include salmon, mackerel, sardines, tuna, flaxseed oil, and ground flaxseeds. Aim to eat fish at least two times each week.  Avoid foods with partially hydrogenated oils in them. These contain trans fats. Examples of foods that contain trans fats are stick margarine, some tub margarines, cookies, crackers, and other baked goods. If you are going to avoid a fat, this is the one to avoid!  WHAT GENERAL GUIDELINES DO I NEED TO FOLLOW?  Check food labels carefully to identify foods with trans fats. Avoid these types of options when possible.  Fill one half of your plate with vegetables and green salads. Eat 4-5 servings of vegetables per  day. A serving of vegetables equals 1 cup of raw leafy vegetables,  cup of raw or cooked cut-up vegetables, or  cup of vegetable juice.  Fill one fourth of your plate with whole grains. Look for the word "whole" as the first word in the ingredient list.  Fill one fourth of your plate with lean protein foods.  Eat 4-5 servings of fruit per day. A serving of fruit equals one medium whole fruit,  cup of dried fruit,  cup of fresh, frozen, or canned fruit. Try to avoid fruits in cups/syrups as the sugar content can be high.  Eat more foods that contain soluble fiber. Examples of foods that contain this type of fiber are apples, broccoli, carrots, beans, peas, and barley. Aim to get 20-30 g of fiber per day.  Eat more home-cooked food and less restaurant, buffet, and fast food.  Limit or avoid alcohol.  Limit foods that are high in starch and sugar.  Avoid fried foods when able.  Cook foods by using methods other than frying. Baking, boiling, grilling, and broiling are all great options. Other fat-reducing suggestions include: ? Removing the skin from poultry. ? Removing all visible fats from meats. ? Skimming the fat off of stews, soups, and gravies before serving them. ? Steaming vegetables in water or broth.  Lose weight if you are overweight. Losing just 5-10% of your initial body weight can help your overall health and prevent diseases such as diabetes and heart disease.  Increase your consumption of nuts, legumes, and seeds to 4-5  servings per week. One serving of dried beans or legumes equals  cup after being cooked, one serving of nuts equals 1 ounces, and one serving of seeds equals  ounce or 1 tablespoon.  WHAT ARE GOOD FOODS CAN I EAT? Grains Grainy breads (try to find bread that is 3 g of fiber per slice or greater), oatmeal, light popcorn. Whole-grain cereals. Rice and pasta, including brown rice and those that are made with whole wheat. Edamame pasta is a great  alternative to grain pasta. It has a higher protein content. Try to avoid significant consumption of white bread, sugary cereals, or pastries/baked goods.  Vegetables All vegetables. Cooked white potatoes do not count as vegetables.  Fruits All fruits, but limit pineapple and bananas as these fruits have a higher sugar content.  Meats and Other Protein Sources Lean, well-trimmed beef, veal, pork, and lamb. Chicken and [place] without skin. All fish and shellfish. Wild duck, rabbit, pheasant, and venison. Egg whites or low-cholesterol egg substitutes. Dried beans, peas, lentils, and tofu.Seeds and most nuts.  Dairy Low-fat or nonfat cheeses, including ricotta, string, and mozzarella. Skim or 1% milk that is liquid, powdered, or evaporated. Buttermilk that is made with low-fat milk. Nonfat or low-fat yogurt. Soy/Almond milk are good alternatives if you cannot handle dairy.  Beverages Water is the best for you. Sports drinks with less sugar are more desirable unless you are a highly active athlete.  Sweets and Desserts Sherbets and fruit ices. Honey, jam, marmalade, jelly, and syrups. Dark chocolate.  Eat all sweets and desserts in moderation.  Fats and Oils Nonhydrogenated (trans-free) margarines. Vegetable oils, including soybean, sesame, sunflower, olive, peanut, safflower, corn, canola, and cottonseed. Salad dressings or mayonnaise that are made with a vegetable oil. Limit added fats and oils that you use for cooking, baking, salads, and as spreads.  Other Cocoa powder. Coffee and tea. Most condiments.  The items listed above may not be a complete list of recommended foods or beverages. Contact your dietitian for more options.

## 2018-03-11 NOTE — Progress Notes (Signed)
Chief Complaint  Patient presents with  . Follow-up    Subjective Bill Fox is a 77 y.o. male who presents for hypertension follow up. He does monitor home blood pressures. Blood pressures ranging from 130's/70's on average. He is compliant with medication- Cardura 4 mg/d. Patient has these side effects of medication: none He is not adhering to a healthy diet overall. Current exercise: some walking  Dyslipidemia noted on lipid panel 1 yr ago, not on a statin. Diet is poor, he would prefer to clean that up prior to starting a medication. No known hx of CAD.  Hx of insomnia sometimes controlled on Remeron.  It does help with anxiety. No AE's with medication.  Has been having watering eyes b/l. Hx of cataract removal.    Past Medical History:  Diagnosis Date  . Anxiety   . At risk for sleep apnea    STOP-BANG= 5     SENT TO PCP 11-29-2013  . Chronic fatigue   . DJD (degenerative joint disease)    RIGHT KNEE  . History of colon polyps    2011  &  2013 --  ADENOMATOUS  . History of pulmonary embolism   . Hyperlipidemia    pt denies ever having high chol  . Hypertension   . Right knee meniscal tear   . Ulcerative colitis     Review of Systems Cardiovascular: no chest pain Respiratory:  no shortness of breath  Exam BP 132/78 (BP Location: Left Arm, Patient Position: Sitting, Cuff Size: Large)   Pulse 78   Temp 98.4 F (36.9 C) (Oral)   Ht 6' (1.829 m)   Wt 273 lb 8 oz (124.1 kg)   SpO2 93%   BMI 37.09 kg/m  General:  well developed, well nourished, in no apparent distress Heart: RRR, no bruits, no LE edema Lungs: clear to auscultation, no accessory muscle use Psych: well oriented with normal range of affect and appropriate judgment/insight  Essential hypertension - Plan: Comprehensive metabolic panel  Insomnia, unspecified type  Dyslipidemia - Plan: Lipid panel  Flu vaccine need - Plan: Flu vaccine HIGH DOSE PF (Fluzone High dose)  Dry eye syndrome of  both eyes  Cont Cardura. Offered to change, but he declined and would rather try to clean up his diet. Counseled on diet and exercise. Cont Remeron. Told his wt may be affected by this, but he prefers not to change. Artificial tears. F/u in 6 mo w me and AWV. The patient voiced understanding and agreement to the plan.  Waverly Hall, DO 03/11/18  8:48 AM

## 2018-03-15 ENCOUNTER — Other Ambulatory Visit: Payer: Self-pay | Admitting: Family Medicine

## 2018-03-15 ENCOUNTER — Other Ambulatory Visit: Payer: Self-pay | Admitting: Internal Medicine

## 2018-03-16 MED ORDER — DOXAZOSIN MESYLATE 8 MG PO TABS: ORAL_TABLET | ORAL | 0 refills | Status: DC

## 2018-04-27 ENCOUNTER — Ambulatory Visit (INDEPENDENT_AMBULATORY_CARE_PROVIDER_SITE_OTHER): Payer: Medicare Other | Admitting: Internal Medicine

## 2018-04-27 ENCOUNTER — Encounter: Payer: Self-pay | Admitting: Internal Medicine

## 2018-04-27 VITALS — BP 140/80 | HR 85 | Temp 98.4°F | Resp 18 | Ht 72.0 in | Wt 268.0 lb

## 2018-04-27 DIAGNOSIS — B029 Zoster without complications: Secondary | ICD-10-CM | POA: Insufficient documentation

## 2018-04-27 DIAGNOSIS — R350 Frequency of micturition: Secondary | ICD-10-CM | POA: Diagnosis not present

## 2018-04-27 LAB — POCT URINALYSIS DIPSTICK
BILIRUBIN UA: NEGATIVE
Glucose, UA: NEGATIVE
KETONES UA: NEGATIVE
Leukocytes, UA: NEGATIVE
NITRITE UA: NEGATIVE
PROTEIN UA: NEGATIVE
Spec Grav, UA: 1.015 (ref 1.010–1.025)
UROBILINOGEN UA: NEGATIVE U/dL — AB
pH, UA: 7.5 (ref 5.0–8.0)

## 2018-04-27 MED ORDER — GABAPENTIN 100 MG PO CAPS: ORAL_CAPSULE | ORAL | 3 refills | Status: DC

## 2018-04-27 MED ORDER — VALACYCLOVIR HCL 1 G PO TABS: 1000.0000 mg | ORAL_TABLET | Freq: Three times a day (TID) | ORAL | 0 refills | Status: DC

## 2018-04-27 NOTE — Patient Instructions (Addendum)
Start the anti-viral today - take for one week.  Take the gabapentin for the pain - this may cause drowsiness.  The dose can be adjusted if needed to better control your pain.  You can take tylenol and advil as needed.  Call your Dr Nani Ravens or [place] if your pain is not controlled.     Shingles Shingles, which is also known as herpes zoster, is an infection that causes a painful skin rash and fluid-filled blisters. Shingles is not related to genital herpes, which is a sexually transmitted infection. Shingles only develops in people who:  Have had chickenpox.  Have received the chickenpox vaccine. (This is rare.)  What are the causes? Shingles is caused by varicella-zoster virus (VZV). This is the same virus that causes chickenpox. After exposure to VZV, the virus stays in the body in an inactive (dormant) state. Shingles develops if the virus reactivates. This can happen many years after the initial exposure to VZV. It is not known what causes this virus to reactivate. What increases the risk? People who have had chickenpox or received the chickenpox vaccine are at risk for shingles. Infection is more common in people who:  Are older than age 71.  Have a weakened defense (immune) system, such as those with HIV, AIDS, or cancer.  Are taking medicines that weaken the immune system, such as transplant medicines.  Are under great stress.  What are the signs or symptoms? Early symptoms of this condition include itching, tingling, and pain in an area on your skin. Pain may be described as burning, stabbing, or throbbing. A few days or weeks after symptoms start, a painful red rash appears, usually on one side of the body in a bandlike or beltlike pattern. The rash eventually turns into fluid-filled blisters that break open, scab over, and dry up in about 2-3 weeks. At any time during the infection, you may also develop:  A fever.  Chills.  A headache.  An upset stomach.  How is this  diagnosed? This condition is diagnosed with a skin exam. Sometimes, skin or fluid samples are taken from the blisters before a diagnosis is made. These samples are examined under a microscope or sent to a lab for testing. How is this treated? There is no specific cure for this condition. Your health care provider will probably prescribe medicines to help you manage pain, recover more quickly, and avoid long-term problems. Medicines may include:  Antiviral drugs.  Anti-inflammatory drugs.  Pain medicines.  If the area involved is on your face, you may be referred to a specialist, such as an eye doctor (ophthalmologist) or an ear, nose, and throat (ENT) doctor to help you avoid eye problems, chronic pain, or disability. Follow these instructions at home: Medicines  Take medicines only as directed by your health care provider.  Apply an anti-itch or numbing cream to the affected area as directed by your health care provider. Blister and Rash Care  Take a cool bath or apply cool compresses to the area of the rash or blisters as directed by your health care provider. This may help with pain and itching.  Keep your rash covered with a loose bandage (dressing). Wear loose-fitting clothing to help ease the pain of material rubbing against the rash.  Keep your rash and blisters clean with mild soap and cool water or as directed by your health care provider.  Check your rash every day for signs of infection. These include redness, swelling, and pain that lasts or  increases.  Do not pick your blisters.  Do not scratch your rash. General instructions  Rest as directed by your health care provider.  Keep all follow-up visits as directed by your health care provider. This is important.  Until your blisters scab over, your infection can cause chickenpox in people who have never had it or been vaccinated against it. To prevent this from happening, avoid contact with other people,  especially: ? Babies. ? Pregnant women. ? Children who have eczema. ? Elderly people who have transplants. ? People who have chronic illnesses, such as leukemia or AIDS. Contact a health care provider if:  Your pain is not relieved with prescribed medicines.  Your pain does not get better after the rash heals.  Your rash looks infected. Signs of infection include redness, swelling, and pain that lasts or increases. Get help right away if:  The rash is on your face or nose.  You have facial pain, pain around your eye area, or loss of feeling on one side of your face.  You have ear pain or you have ringing in your ear.  You have loss of taste.  Your condition gets worse. This information is not intended to replace advice given to you by your health care provider. Make sure you discuss any questions you have with your health care provider. Document Released: 05/06/2005 Document Revised: 12/31/2015 Document Reviewed: 03/17/2014 Elsevier Interactive Patient Education  2018 [employer].

## 2018-04-27 NOTE — Assessment & Plan Note (Signed)
He is having urinary frequency Urine dip here today unremarkable-we will send for culture, but discussed with him that his symptoms are related to the shingles

## 2018-04-27 NOTE — Assessment & Plan Note (Signed)
Rash and symptoms consistent with shingles Valtrex 1000 mg 3 times daily x7 days Given his pain will start gabapentin 100 mg twice daily-advised that we can titrate this up as tolerated to control his pain Can take Tylenol or Advil, but keeping Advil to a minimum given his history of colitis Advised him to be in contact with his PCP or myself to help titrate his medication for pain control

## 2018-04-27 NOTE — Progress Notes (Signed)
Subjective:    Patient ID: Bill Fox, male    DOB: 08/09/1940, 77 y.o.   MRN: [id number]  HPI The patient is here for an acute visit.   His pain started two days ago.  He is having right middle back pain and it radiates to the RUQ.  He denies any obvious injuries.  He is having increased urinary frequency and darker urine.  In the past he did have similar symptoms with a kidney infection.  He has taken ibuprofen.  He has felt more fatigued.  He denies any fevers, shortness of breath, chest pain, abdominal pain, changes in bowel habits, nausea, dysuria, hematuria and headaches.  He did have some blood in the stool, but very small amount and this is not unusual for him because he has colitis.   Medications and allergies reviewed with patient and updated if appropriate.  Patient Active Problem List   Diagnosis Date Noted  . Dyslipidemia 03/11/2018  . Light headed 01/06/2017  . Dizziness 07/12/2015  . Left sided colitis ([place])   . Hypothyroidism 05/01/2015  . Solitary pulmonary nodule 02/01/2015  . Cerumen impaction 03/03/2014  . Leg swelling 06/10/2013  . Knee pain, right 06/10/2013  . Scotoma 09/10/2012  . Seborrheic keratosis 09/03/2011  . Mechanical back pain 11/07/2010  . MICROSCOPIC HEMATURIA 08/01/2009  . ANAL FISSURE 07/17/2009  . Insomnia 06/27/2009  . Cough 05/26/2009  . HEADACHE 02/16/2009  . FATIQUE AND MALAISE 10/21/2008  . PULMONARY EMBOLISM AND INFARCTION 07/20/2008  . Other iron deficiency anemias 07/02/2007  . RECTAL BLEEDING 07/02/2007  . Hyperlipidemia LDL goal <130 07/01/2007  . Morbid obesity ([place]) 07/01/2007  . Essential hypertension 07/01/2007  . BENIGN POSITIONAL VERTIGO, HX OF 07/01/2007  . COLONIC POLYPS, HX OF 07/01/2007    Current Outpatient Medications on File Prior to Visit  Medication Sig Dispense Refill  . DELZICOL 400 MG CPDR DR capsule TAKE 4 CAPSULES THREE TIMES A DAY 1080 capsule 3  . doxazosin (CARDURA) 8 MG tablet 1/2 at  bedtime 45 tablet 0  . mercaptopurine (PURINETHOL) 50 MG tablet TAKE 3 TABLETS DAILY ON AN EMPTY STOMACH 1 HOUR BEFORE OR 2 HOURS AFTER A MEAL (NEED OFFICE VISIT FOR FURTHER REFILLS) 270 tablet 3  . mirtazapine (REMERON) 30 MG tablet TAKE 1 TABLET AT BEDTIME 90 tablet 0   No current facility-administered medications on file prior to visit.     Past Medical History:  Diagnosis Date  . Anxiety   . At risk for sleep apnea    STOP-BANG= 5     SENT TO PCP 11-29-2013  . Chronic fatigue   . DJD (degenerative joint disease)    RIGHT KNEE  . History of colon polyps    2011  &  2013 --  ADENOMATOUS  . History of pulmonary embolism   . Hyperlipidemia    pt denies ever having high chol  . Hypertension   . Right knee meniscal tear   . Ulcerative colitis     Past Surgical History:  Procedure Laterality Date  . CATARACT EXTRACTION W/ INTRAOCULAR LENS IMPLANT Left 2014  . COLONOSCOPY W/ POLYPECTOMY  MULTIPLE--  LAST ONE 09-12-2011  . KNEE ARTHROSCOPY WITH DRILLING/MICROFRACTURE Right 12/03/2013   Procedure: RIGHT KNEE ARTHROSCOPY PARTIAL MEDIAL MENISCECTOMY DEBRIDEMENT SYNOVECTOMY;  Surgeon: Johnn Hai, MD;  Location: [facility];  Service: Orthopedics;  Laterality: Right;  . NASAL FRACTURE SURGERY  1985  . TRANSTHORACIC ECHOCARDIOGRAM  11-01-2008   MILD LVH/  EF 55%/  GRADE I DIASTOLIC DYSFUNCTION/  MILD LAE    Social History   Socioeconomic History  . Marital status: Married    Spouse name: Not on file  . Number of children: Not on file  . Years of education: Not on file  . Highest education level: Not on file  Occupational History  . Occupation: Armed forces operational officer  Social Needs  . Financial resource strain: Not on file  . Food insecurity:    Worry: Not on file    Inability: Not on file  . Transportation needs:    Medical: Not on file    Non-medical: Not on file  Tobacco Use  . Smoking status: Former Smoker    Packs/day: 1.00    Years: 30.00    Pack years:  30.00    Types: Cigarettes    Last attempt to quit: 05/20/1993    Years since quitting: 24.9  . Smokeless tobacco: Never Used  Substance and Sexual Activity  . Alcohol use: Yes    Alcohol/week: 1.0 standard drinks    Types: 1 Cans of beer per week    Comment: beer on occ  . Drug use: No  . Sexual activity: Yes  Lifestyle  . Physical activity:    Days per week: Not on file    Minutes per session: Not on file  . Stress: Not on file  Relationships  . Social connections:    Talks on phone: Not on file    Gets together: Not on file    Attends religious service: Not on file    Active member of club or organization: Not on file    Attends meetings of clubs or organizations: Not on file    Relationship status: Not on file  Other Topics Concern  . Not on file  Social History Narrative  . Not on file    Family History  Problem Relation Age of Onset  . Diabetes Mother   . Cancer Father        unknown type  . Stomach cancer Father   . Lung cancer Brother   . Diabetes Paternal Uncle        x 2  . Colon cancer Neg Hx     Review of Systems  Constitutional: Positive for fatigue. Negative for fever.  HENT: Negative for congestion, sinus pain and sore throat.   Respiratory: Negative for cough and shortness of breath.   Cardiovascular: Negative for chest pain, palpitations and leg swelling.  Gastrointestinal: Positive for blood in stool (very little the past couple of days). Negative for abdominal pain, constipation, diarrhea and nausea.  Genitourinary: Positive for frequency. Negative for difficulty urinating, dysuria and hematuria.       Urine is darker, No change in urine odor  Neurological: Negative for light-headedness and headaches.       Objective:   Vitals:   04/27/18 1314  BP: 140/80  Pulse: 85  Resp: 18  Temp: 98.4 F (36.9 C)  SpO2: 95%   BP Readings from Last 3 Encounters:  04/27/18 140/80  03/11/18 132/78  10/15/17 139/73   Wt Readings from Last 3  Encounters:  04/27/18 268 lb (121.6 kg)  03/11/18 273 lb 8 oz (124.1 kg)  10/15/17 263 lb 9.6 oz (119.6 kg)   Body mass index is 36.35 kg/m.   Physical Exam  Constitutional: He appears well-developed and well-nourished. No distress (Appears uncomfortable).  HENT:  Head: Normocephalic and atraumatic.  Cardiovascular: Normal rate, regular rhythm and normal heart sounds.  Pulmonary/Chest:  Effort normal and breath sounds normal. No respiratory distress. He has no wheezes. He has no rales.  Skin: Skin is warm and dry. Rash (Erythematous rash mid right back and anterior chest under right breast-clusters of vesicles with an erythematous areas) noted. He is not diaphoretic.           Assessment & Plan:    See Problem List for Assessment and Plan of chronic medical problems.

## 2018-04-28 ENCOUNTER — Other Ambulatory Visit: Payer: Medicare Other

## 2018-04-28 DIAGNOSIS — R350 Frequency of micturition: Secondary | ICD-10-CM

## 2018-04-29 LAB — URINE CULTURE
MICRO NUMBER:: 91477255
SPECIMEN QUALITY:: ADEQUATE

## 2018-05-04 ENCOUNTER — Ambulatory Visit: Payer: Self-pay

## 2018-05-04 NOTE — Telephone Encounter (Signed)
Returned call to patient.  C/o continued pain in shingle area right chest.  Reported he will finish the Valtrex today.  Stated the Gabapentin has helped, but the pain last night worsened.  Stated he has not experienced any side effects from the medication. Denied fever or chills.  Reported the rash continues to be red, dry. Stated has 2 blisters on the front of the chest, within the rash; and on the right  back, there is an area of inflammation, that hasn't changed in past 2 days.  Denied itching.  Reported he has been urinating more frequently, but has been drinking increased amts. of water.  Denied burning with urination.  Stated he will finish his Valtrex today, and questioned if the medication was making him urinate more frequently.  Appt. scheduled with PCP 12/17 to check rash and address his pain.  Given care advice per protocol.  Verb. Understanding.                Reason for Disposition . [1] Looks infected (spreading redness, pus) AND [2] no fever  Answer Assessment - Initial Assessment Questions 1. APPEARANCE of RASH: "Describe the rash."      Red, rash from mid back to the right chest  2. LOCATION: "Where is the rash located?"      See above 3. ONSET: "When did the rash start?"      Broke out about a 10 days ago  4. ITCHING: "Does the rash itch?" If so, ask: "How bad is the itch?"  (Scale 1-10; or mild, moderate, severe)     Denies itching  5. PAIN: "Does the rash hurt?" If so, ask: "How bad is the pain?"  (Scale 1-10; or mild, moderate, severe)     5-6/10 6. OTHER SYMPTOMS: "Do you have any other symptoms?" (e.g., fever)    Denied fever; urinating frequently; night time urine output seems more freq.  (has been drinking increased amts of water.)  Denied any burning with urination  Protocols used: [facility]  Message from Cecelia Byars, NT sent at 05/04/2018 10:34 AM EST   Patient says he was told he has shingles and he has questions about the medication ,he wants to know if  he is supposed to get a refill, He also states the pain medication is not working he says he was in [place] all night,  [phone]

## 2018-05-05 ENCOUNTER — Ambulatory Visit (INDEPENDENT_AMBULATORY_CARE_PROVIDER_SITE_OTHER): Payer: Medicare Other | Admitting: Family Medicine

## 2018-05-05 ENCOUNTER — Encounter: Payer: Self-pay | Admitting: Family Medicine

## 2018-05-05 VITALS — BP 138/80 | HR 83 | Temp 98.5°F | Ht 72.0 in | Wt 265.0 lb

## 2018-05-05 DIAGNOSIS — B029 Zoster without complications: Secondary | ICD-10-CM | POA: Diagnosis not present

## 2018-05-05 NOTE — Patient Instructions (Addendum)
Continue gabapentin for now.   If you have no pain, we can try to come off of it.  If the skin feels/looks a lot better, you can cancel the appointment.   Let us know if you need anything.

## 2018-05-05 NOTE — Progress Notes (Signed)
Pre visit review using our clinic review tool, if applicable. No additional management support is needed unless otherwise documented below in the visit note. 

## 2018-05-05 NOTE — Progress Notes (Signed)
Chief Complaint  Patient presents with  . Herpes Zoster    Bill Fox is a 77 y.o. male here for a skin complaint.  Duration: 10 days Location: back Pruritic? No Painful? Yes Drainage? No Other associated symptoms: none Therapies tried thus far: Valtrex finished yesterday, on gabapentin No AE's.   ROS:  Const: No fevers Skin: As noted in HPI  Past Medical History:  Diagnosis Date  . Anxiety   . At risk for sleep apnea    STOP-BANG= 5     SENT TO PCP 11-29-2013  . Chronic fatigue   . DJD (degenerative joint disease)    RIGHT KNEE  . History of colon polyps    2011  &  2013 --  ADENOMATOUS  . History of pulmonary embolism   . Hyperlipidemia    pt denies ever having high chol  . Hypertension   . Right knee meniscal tear   . Ulcerative colitis     BP 138/80 (BP Location: Left Arm, Patient Position: Sitting, Cuff Size: Large)   Pulse 83   Temp 98.5 F (36.9 C) (Oral)   Ht 6' (1.829 m)   Wt 265 lb (120.2 kg)   SpO2 94%   BMI 35.94 kg/m  Gen: awake, alert, appearing stated age Lungs: No accessory muscle use Skin: See below. No drainage,TTP, fluctuance Psych: Age appropriate judgment and insight      Herpes zoster without complication  According to Dr. Eilleen Kempf note, there was sig erythema and vesicles, he feels fine now. Was concerned about AE's with the gabapentin, told him what the max daily dose is. He feels better.  F/u in 1 week to reck.  The patient voiced understanding and agreement to the plan.  [place], DO 05/05/18 10:13 AM

## 2018-05-11 ENCOUNTER — Encounter: Payer: Self-pay | Admitting: Family Medicine

## 2018-05-11 ENCOUNTER — Ambulatory Visit (INDEPENDENT_AMBULATORY_CARE_PROVIDER_SITE_OTHER): Payer: Medicare Other | Admitting: Family Medicine

## 2018-05-11 VITALS — BP 132/78 | HR 69 | Temp 98.6°F | Ht 72.0 in | Wt 264.4 lb

## 2018-05-11 DIAGNOSIS — B029 Zoster without complications: Secondary | ICD-10-CM | POA: Diagnosis not present

## 2018-05-11 NOTE — Progress Notes (Signed)
Pre visit review using our clinic review tool, if applicable. No additional management support is needed unless otherwise documented below in the visit note. 

## 2018-05-11 NOTE — Patient Instructions (Addendum)
Take 1-2 caps of the gabapentin around 1-2 PM.  Topical capsaicin can be used once the skin gets back to normal.   Let us know if you need anything.

## 2018-05-11 NOTE — Progress Notes (Signed)
Chief Complaint  Patient presents with  . Follow-up    Bill Fox is a 77 y.o. male here for a skin f/u.  Dx'd w shingles. Starting to dry up. Still having burning. Finished Valtrex. Taking gabapentin, no AE's, pain worse in evening. Takes right when he wakes up and before bed.   ROS:  Const: No fevers Skin: As noted in HPI  Past Medical History:  Diagnosis Date  . Anxiety   . At risk for sleep apnea    STOP-BANG= 5     SENT TO PCP 11-29-2013  . Chronic fatigue   . DJD (degenerative joint disease)    RIGHT KNEE  . History of colon polyps    2011  &  2013 --  ADENOMATOUS  . History of pulmonary embolism   . Hyperlipidemia    pt denies ever having high chol  . Hypertension   . Right knee meniscal tear   . Ulcerative colitis     BP 132/78 (BP Location: Left Arm, Patient Position: Sitting, Cuff Size: Large)   Pulse 69   Temp 98.6 F (37 C) (Oral)   Ht 6' (1.829 m)   Wt 264 lb 6 oz (119.9 kg)   SpO2 95%   BMI 35.86 kg/m  Gen: awake, alert, appearing stated age Lungs: No accessory muscle use Skin: See below Psych: Age appropriate judgment and insight   Back, R  Herpes zoster without complication  Actual rash looks better. Change dosing/timing of gabapentin to 1-2 caps in early afternoon and 2 at bedtime. Cont until burning resolves, stated there is not a good timeline for this.  F/u prn at this point.  The patient voiced understanding and agreement to the plan.  [place], DO 05/11/18 9:18 AM

## 2018-06-15 ENCOUNTER — Other Ambulatory Visit: Payer: Self-pay | Admitting: Family Medicine

## 2018-09-02 NOTE — Progress Notes (Signed)
Virtual Visit via Video Note  I connected with patient on 09/03/18 at  9:00 AM EDT by a video enabled telemedicine application and verified that I am speaking with the correct person using two identifiers.   THIS ENCOUNTER IS A VIRTUAL VISIT DUE TO COVID-19 - PATIENT WAS NOT SEEN IN THE OFFICE. PATIENT HAS CONSENTED TO VIRTUAL VISIT / TELEMEDICINE VISIT   Location of patient: home  Location of provider: office  I discussed the limitations of evaluation and management by telemedicine and the availability of in person appointments. The patient expressed understanding and agreed to proceed.   Subjective:   Bill Fox is a 78 y.o. male who presents for Medicare Annual/Subsequent preventive examination.  Review of Systems: No ROS.  Medicare Wellness Visit. Additional risk factors are reflected in the social history. Cardiac Risk Factors include: advanced age (>38mn, >>69women);dyslipidemia;male gender;hypertension;obesity (BMI >30kg/m2) Sleep patterns: Remeron at bedtime. 6-8 hrs  Home Safety/Smoke Alarms: Feels safe in home. Smoke alarms in place.    Male:   CCS-  07/09/17.  REcall 3 yrs. PSA-  Lab Results  Component Value Date   PSA 0.78 06/28/2010       Objective:    Vitals: Unable to assess. This visit is enabled though telemedicine due to Covid 19.  Pt reports BP 137/83  Advanced Directives 09/03/2018 09/01/2017 07/12/2015 05/01/2015 01/27/2015 01/13/2015 12/03/2013  Does Patient Have a Medical Advance Directive? No No No No Yes No Patient does not have advance directive;Patient would like information  Would patient like information on creating a medical advance directive? Yes (MAU/Ambulatory/Procedural Areas - Information given) Yes (MAU/Ambulatory/Procedural Areas - Information given) No - patient declined information - - No - patient declined information Advance directive packet given    Tobacco Social History   Tobacco Use  Smoking Status Former Smoker  .  Packs/day: 1.00  . Years: 30.00  . Pack years: 30.00  . Types: Cigarettes  . Last attempt to quit: 05/20/1993  . Years since quitting: 25.3  Smokeless Tobacco Never Used     Counseling given: Not Answered   Clinical Intake:  Pain : No/denies pain   Past Medical History:  Diagnosis Date  . Anxiety   . At risk for sleep apnea    STOP-BANG= 5     SENT TO PCP 11-29-2013  . Chronic fatigue   . DJD (degenerative joint disease)    RIGHT KNEE  . History of colon polyps    2011  &  2013 --  ADENOMATOUS  . History of pulmonary embolism   . Hyperlipidemia    pt denies ever having high chol  . Hypertension   . Right knee meniscal tear   . Ulcerative colitis    Past Surgical History:  Procedure Laterality Date  . CATARACT EXTRACTION W/ INTRAOCULAR LENS IMPLANT Left 2014  . COLONOSCOPY W/ POLYPECTOMY  MULTIPLE--  LAST ONE 09-12-2011  . KNEE ARTHROSCOPY WITH DRILLING/MICROFRACTURE Right 12/03/2013   Procedure: RIGHT KNEE ARTHROSCOPY PARTIAL MEDIAL MENISCECTOMY DEBRIDEMENT SYNOVECTOMY;  Surgeon: JJohnn Hai MD;  [place] [facility]  Service: Orthopedics;  Laterality: Right;  . NASAL FRACTURE SURGERY  1985  . TRANSTHORACIC ECHOCARDIOGRAM  11-01-2008   MILD LVH/  EF 532%/ GRADE I DIASTOLIC DYSFUNCTION/  MILD LAE   Family History  Problem Relation Age of Onset  . Diabetes Mother   . Cancer Father        unknown type  . Stomach cancer Father   . Lung cancer Brother   .  Diabetes Paternal Uncle        x 2  . Colon cancer Neg Hx    Social History   Socioeconomic History  . Marital status: Married    Spouse name: Not on file  . Number of children: Not on file  . Years of education: Not on file  . Highest education level: Not on file  Occupational History  . Occupation: Armed forces operational officer  Social Needs  . Financial resource strain: Not on file  . Food insecurity:    Worry: Not on file    Inability: Not on file  . Transportation needs:    Medical: Not on  file    Non-medical: Not on file  Tobacco Use  . Smoking status: Former Smoker    Packs/day: 1.00    Years: 30.00    Pack years: 30.00    Types: Cigarettes    Last attempt to quit: 05/20/1993    Years since quitting: 25.3  . Smokeless tobacco: Never Used  Substance and Sexual Activity  . Alcohol use: Yes    Alcohol/week: 1.0 standard drinks    Types: 1 Cans of beer per week    Comment: beer on occ  . Drug use: No  . Sexual activity: Yes  Lifestyle  . Physical activity:    Days per week: Not on file    Minutes per session: Not on file  . Stress: Not on file  Relationships  . Social connections:    Talks on phone: Not on file    Gets together: Not on file    Attends religious service: Not on file    Active member of club or organization: Not on file    Attends meetings of clubs or organizations: Not on file    Relationship status: Not on file  Other Topics Concern  . Not on file  Social History Narrative  . Not on file    Outpatient Encounter Medications as of 09/03/2018  Medication Sig  . DELZICOL 400 MG CPDR DR capsule TAKE 4 CAPSULES THREE TIMES A DAY  . doxazosin (CARDURA) 8 MG tablet TAKE ONE-HALF (1/2) TABLET AT BEDTIME  . mercaptopurine (PURINETHOL) 50 MG tablet TAKE 3 TABLETS DAILY ON AN EMPTY STOMACH 1 HOUR BEFORE OR 2 HOURS AFTER A MEAL (NEED OFFICE VISIT FOR FURTHER REFILLS)  . mirtazapine (REMERON) 30 MG tablet TAKE 1 TABLET AT BEDTIME  . gabapentin (NEURONTIN) 100 MG capsule Take 1-2 caps in early afternoon and 2 caps at bedtime   No facility-administered encounter medications on file as of 09/03/2018.     Activities of Daily Living In your present state of health, do you have any difficulty performing the following activities: 09/03/2018  Hearing? N  Vision? N  Difficulty concentrating or making decisions? N  Walking or climbing stairs? N  Dressing or bathing? N  Doing errands, shopping? N  Preparing Food and eating ? N  Using the Toilet? N  In the  past six months, have you accidently leaked urine? N  Do you have problems with loss of bowel control? N  Managing your Medications? N  Managing your Finances? N  Housekeeping or managing your Housekeeping? N  Some recent data might be hidden    Patient Care Team: [name], DO as PCP - General (Family Medicine)   Assessment:   This is a routine wellness examination for Bill Fox. Physical assessment deferred to PCP.  Exercise Activities and Dietary recommendations Current Exercise Habits: Home exercise routine, Type of exercise:  walking, Time (Minutes): 30, Frequency (Times/Week): 7, Weekly Exercise (Minutes/Week): 210, Exercise limited by: None identified   Diet (meal preparation, eat out, water intake, caffeinated beverages, dairy products, fruits and vegetables): in general, an "unhealthy" diet, on average, 3 meals per day Breakfast: sausage and gravy biscuit Lunch: sandwich Dinner: pizza     Goals    . Weight (lb) < 235 lb (106.6 kg) (pt-stated)     Eating well and exercising       Fall Risk Fall Risk  09/03/2018 09/01/2017  Falls in the past year? 0 No    Depression Screen PHQ 2/9 Scores 09/03/2018 09/01/2017  PHQ - 2 Score 0 0    Cognitive Function Ad8 score reviewed for issues:  Issues making decisions:no  Less interest in hobbies / activities:no  Repeats questions, stories (family complaining):no  Trouble using ordinary gadgets (microwave, computer, phone):no  Forgets the month or year: no  Mismanaging finances: no  Remembering appts:no  Daily problems with thinking and/or memory:no Ad8 score is=0   MMSE - Mini Mental State Exam 09/01/2017  Orientation to time 5  Orientation to Place 5  Registration 3  Attention/ Calculation 5  Recall 3  Language- name 2 objects 2  Language- repeat 1  Language- follow 3 step command 3  Language- read & follow direction 1  Write a sentence 1  Copy design 1  Total score 30        Immunization  History  Administered Date(s) Administered  . Influenza Split 02/18/2016  . Influenza Whole 02/16/2009, 01/31/2010  . Influenza, High Dose Seasonal PF 03/11/2018  . Influenza,inj,Quad PF,6+ Mos 03/03/2014, 01/30/2015  . Pneumococcal Conjugate-13 01/30/2015  . Pneumococcal Polysaccharide-23 06/20/2008  . Tdap 09/03/2011    Screening Tests Health Maintenance  Topic Date Due  . INFLUENZA VACCINE  12/19/2018  . COLONOSCOPY  07/09/2020  . TETANUS/TDAP  09/02/2021  . PNA vac Low Risk Adult  Completed     Plan:   See you next year.  Continue to eat heart healthy diet (full of fruits, vegetables, whole grains, lean protein, water--limit salt, fat, and sugar intake) and increase physical activity as tolerated.  Continue doing brain stimulating activities (puzzles, reading, adult coloring books, staying active) to keep memory sharp.    I have personally reviewed and noted the following in the patient's chart:   . Medical and social history . Use of alcohol, tobacco or illicit drugs  . Current medications and supplements . Functional ability and status . Nutritional status . Physical activity . Advanced directives . List of other physicians . Hospitalizations, surgeries, and ER visits in previous 12 months . Vitals . Screenings to include cognitive, depression, and falls . Referrals and appointments  In addition, I have reviewed and discussed with patient certain preventive protocols, quality metrics, and best practice recommendations. A written personalized care plan for preventive services as well as general preventive health recommendations were provided to patient.     Shela Nevin, [place]  09/03/2018

## 2018-09-03 ENCOUNTER — Encounter: Payer: Self-pay | Admitting: *Deleted

## 2018-09-03 ENCOUNTER — Ambulatory Visit (INDEPENDENT_AMBULATORY_CARE_PROVIDER_SITE_OTHER): Payer: Medicare Other | Admitting: *Deleted

## 2018-09-03 ENCOUNTER — Other Ambulatory Visit: Payer: Self-pay

## 2018-09-03 DIAGNOSIS — Z0000 Encounter for general adult medical examination without abnormal findings: Secondary | ICD-10-CM

## 2018-09-03 NOTE — Patient Instructions (Signed)
See you next year.  Continue to eat heart healthy diet (full of fruits, vegetables, whole grains, lean protein, water--limit salt, fat, and sugar intake) and increase physical activity as tolerated.  Continue doing brain stimulating activities (puzzles, reading, adult coloring books, staying active) to keep memory sharp.    Bill Fox , Thank you for taking time to come for your Medicare Wellness Visit. I appreciate your ongoing commitment to your health goals. Please review the following plan we discussed and let me know if I can assist you in the future.   These are the goals we discussed: Goals    . Weight (lb) < 235 lb (106.6 kg) (pt-stated)     Eating well and exercising       This is a list of the screening recommended for you and due dates:  Health Maintenance  Topic Date Due  . Flu Shot  12/19/2018  . Colon Cancer Screening  07/09/2020  . Tetanus Vaccine  09/02/2021  . Pneumonia vaccines  Completed    Health Maintenance After Age 59 After age 76, you are at a higher risk for certain long-term diseases and infections as well as injuries from falls. Falls are a major cause of broken bones and head injuries in people who are older than age 81. Getting regular preventive care can help to keep you healthy and well. Preventive care includes getting regular testing and making lifestyle changes as recommended by your health care provider. Talk with your health care provider about:  Which screenings and tests you should have. A screening is a test that checks for a disease when you have no symptoms.  A diet and exercise plan that is right for you. What should I know about screenings and tests to prevent falls? Screening and testing are the best ways to find a health problem early. Early diagnosis and treatment give you the best chance of managing medical conditions that are common after age 77. Certain conditions and lifestyle choices may make you more likely to have a fall. Your  health care provider may recommend:  Regular vision checks. Poor vision and conditions such as cataracts can make you more likely to have a fall. If you wear glasses, make sure to get your prescription updated if your vision changes.  Medicine review. Work with your health care provider to regularly review all of the medicines you are taking, including over-the-counter medicines. Ask your health care provider about any side effects that may make you more likely to have a fall. Tell your health care provider if any medicines that you take make you feel dizzy or sleepy.  Osteoporosis screening. Osteoporosis is a condition that causes the bones to get weaker. This can make the bones weak and cause them to break more easily.  Blood pressure screening. Blood pressure changes and medicines to control blood pressure can make you feel dizzy.  Strength and balance checks. Your health care provider may recommend certain tests to check your strength and balance while standing, walking, or changing positions.  Foot health exam. Foot pain and numbness, as well as not wearing proper footwear, can make you more likely to have a fall.  Depression screening. You may be more likely to have a fall if you have a fear of falling, feel emotionally low, or feel unable to do activities that you used to do.  Alcohol use screening. Using too much alcohol can affect your balance and may make you more likely to have a fall. What actions  can I take to lower my risk of falls? General instructions  Talk with your health care provider about your risks for falling. Tell your health care provider if: ? You fall. Be sure to tell your health care provider about all falls, even ones that seem minor. ? You feel dizzy, sleepy, or off-balance.  Take over-the-counter and prescription medicines only as told by your health care provider. These include any supplements.  Eat a healthy diet and maintain a healthy weight. A healthy diet  includes low-fat dairy products, low-fat (lean) meats, and fiber from whole grains, beans, and lots of fruits and vegetables. Home safety  Remove any tripping hazards, such as rugs, cords, and clutter.  Install safety equipment such as grab bars in bathrooms and safety rails on stairs.  Keep rooms and walkways well-lit. Activity   Follow a regular exercise program to stay fit. This will help you maintain your balance. Ask your health care provider what types of exercise are appropriate for you.  If you need a cane or walker, use it as recommended by your health care provider.  Wear supportive shoes that have nonskid soles. Lifestyle  Do not drink alcohol if your health care provider tells you not to drink.  If you drink alcohol, limit how much you have: ? 0-1 drink a day for women. ? 0-2 drinks a day for men.  Be aware of how much alcohol is in your drink. In the [place] one drink equals one typical bottle of beer (12 oz), one-half glass of wine (5 oz), or one shot of hard liquor (1 oz).  Do not use any products that contain nicotine or tobacco, such as cigarettes and e-cigarettes. If you need help quitting, ask your health care provider. Summary  Having a healthy lifestyle and getting preventive care can help to protect your health and wellness after age 48.  Screening and testing are the best way to find a health problem early and help you avoid having a fall. Early diagnosis and treatment give you the best chance for managing medical conditions that are more common for people who are older than age 37.  Falls are a major cause of broken bones and head injuries in people who are older than age 34. Take precautions to prevent a fall at home.  Work with your health care provider to learn what changes you can make to improve your health and wellness and to prevent falls. This information is not intended to replace advice given to you by your health care provider. Make sure you  discuss any questions you have with your health care provider. Document Released: 03/19/2017 Document Revised: 03/19/2017 Document Reviewed: 03/19/2017 Elsevier Interactive Patient Education  2019 [employer].

## 2018-09-03 NOTE — Progress Notes (Signed)
Noted. Agree with above.  [place], DO 09/03/18 4:10 PM

## 2019-02-18 DIAGNOSIS — Z23 Encounter for immunization: Secondary | ICD-10-CM | POA: Diagnosis not present

## 2019-02-25 ENCOUNTER — Other Ambulatory Visit: Payer: Self-pay

## 2019-02-25 MED ORDER — MESALAMINE 400 MG PO CPDR: DELAYED_RELEASE_CAPSULE | ORAL | 0 refills | Status: DC

## 2019-08-09 ENCOUNTER — Other Ambulatory Visit: Payer: Self-pay | Admitting: Family Medicine

## 2019-08-12 DIAGNOSIS — S50312A Abrasion of left elbow, initial encounter: Secondary | ICD-10-CM | POA: Diagnosis not present

## 2019-08-12 DIAGNOSIS — S20229A Contusion of unspecified back wall of thorax, initial encounter: Secondary | ICD-10-CM | POA: Diagnosis not present

## 2019-08-18 ENCOUNTER — Other Ambulatory Visit: Payer: Self-pay

## 2019-08-19 ENCOUNTER — Encounter: Payer: Self-pay | Admitting: Family Medicine

## 2019-08-19 ENCOUNTER — Other Ambulatory Visit: Payer: Self-pay | Admitting: Family Medicine

## 2019-08-19 ENCOUNTER — Ambulatory Visit (INDEPENDENT_AMBULATORY_CARE_PROVIDER_SITE_OTHER): Payer: Medicare Other | Admitting: Family Medicine

## 2019-08-19 ENCOUNTER — Other Ambulatory Visit: Payer: Self-pay

## 2019-08-19 VITALS — BP 132/86 | HR 106 | Temp 97.3°F | Ht 72.0 in | Wt 249.2 lb

## 2019-08-19 DIAGNOSIS — S20212A Contusion of left front wall of thorax, initial encounter: Secondary | ICD-10-CM | POA: Diagnosis not present

## 2019-08-19 MED ORDER — MELOXICAM 15 MG PO TABS: 15.0000 mg | ORAL_TABLET | Freq: Every day | ORAL | 0 refills | Status: DC

## 2019-08-19 MED ORDER — HYDROCODONE-ACETAMINOPHEN 5-325 MG PO TABS: 1.0000 | ORAL_TABLET | Freq: Four times a day (QID) | ORAL | 0 refills | Status: AC | PRN

## 2019-08-19 NOTE — Patient Instructions (Addendum)
OK to take Tylenol 1000 mg (2 extra strength tabs) or 975 mg (3 regular strength tabs) every 6 hours as needed.  Ice/cold pack over area for 10-15 min twice daily.  Heat (pad or rice pillow in microwave) over affected area, 10-15 minutes twice daily.   Do not drink alcohol, do any illicit/street drugs, drive or do anything that requires alertness while on this medicine.   Take deep breaths.   Let us know if you need anything.

## 2019-08-19 NOTE — Progress Notes (Signed)
Musculoskeletal Exam  Patient: Bill Fox DOB: 1940/07/22  DOS: 08/19/2019  SUBJECTIVE:  Chief Complaint:   Chief Complaint  Patient presents with  . Back Pain    Bill Fox is a 79 y.o.  male for evaluation and treatment of his upper back pain.   Onset:  1 week ago.  [place] off a camper and landed on his back.  Location: upper left Character:  sharp and stabbing  Progression of issue:  is unchanged Associated symptoms: pain w LUE movement; unknown redness/swelling/bruising.  Neurovascular symptoms: no  Past Medical History:  Diagnosis Date  . Anxiety   . At risk for sleep apnea    STOP-BANG= 5     SENT TO PCP 11-29-2013  . Chronic fatigue   . DJD (degenerative joint disease)    RIGHT KNEE  . History of colon polyps    2011  &  2013 --  ADENOMATOUS  . History of pulmonary embolism   . Hyperlipidemia    pt denies ever having high chol  . Hypertension   . Right knee meniscal tear   . Ulcerative colitis    Objective:  VITAL SIGNS: BP 132/86 (BP Location: Left Arm, Patient Position: Sitting, Cuff Size: Normal)   Pulse (!) 106   Temp (!) 97.3 F (36.3 C) (Temporal)   Ht 6' (1.829 m)   Wt 249 lb 4 oz (113.1 kg)   SpO2 93%   BMI 33.80 kg/m  Constitutional: Well formed, well developed. No acute distress. HENT: Normocephalic, atraumatic.  Thorax & Lungs:  No accessory muscle use Skin: Warm. Dry. No erythema. No rash.  Musculoskeletal: low back.   Tenderness to palpation: TTP over L CVA R7-10 Deformity: no Ecchymosis: no No crepitus or deformity Neurologic: Normal sensory function. No focal deficits noted. DTR's equal and symmetric in UE's. No clonus. Psychiatric: Normal mood. Age appropriate judgment and insight. Alert & oriented x 3.    Assessment:  Contusion of rib on left side, initial encounter - Plan: HYDROcodone-acetaminophen (NORCO/VICODIN) 5-325 MG tablet, meloxicam (MOBIC) 15 MG tablet  Plan: Warnings about Norco given.   Stretches/exercises, heat, ice, Tylenol. Had neg XR at [facility]. F/u prn. The patient voiced understanding and agreement to the plan.   [place], DO 08/19/19  1:00 PM

## 2019-08-23 ENCOUNTER — Telehealth: Payer: Self-pay

## 2019-08-23 ENCOUNTER — Telehealth: Payer: Self-pay | Admitting: Family Medicine

## 2019-08-23 DIAGNOSIS — S20212A Contusion of left front wall of thorax, initial encounter: Secondary | ICD-10-CM

## 2019-08-23 MED ORDER — MELOXICAM 15 MG PO TABS: 15.0000 mg | ORAL_TABLET | Freq: Every day | ORAL | 0 refills | Status: DC

## 2019-08-23 NOTE — Telephone Encounter (Signed)
I'd probably stop the meloxicam since that is more scheduled and slightly higher percentage. He likely isn't needing much more of the hydrocodone. Ty.

## 2019-08-23 NOTE — Telephone Encounter (Signed)
He does not need a refill. He has not taken the mobic until today but has been taking the hydrocodone. He stated ankles, calves and shins are swollen.He  states they feel stiff.   He wanted to know if this is a side effect of either medication.

## 2019-08-23 NOTE — Telephone Encounter (Signed)
Refill for Hydrocodone Last OV--- 08/19/2019 No scheduled upcoming Last Refill----#20 no refills on 08/19/2019 No CSC//no UDS

## 2019-08-23 NOTE — Telephone Encounter (Signed)
Called the patient back and we had already spoken regarding the medication problem. Please see phone note dated 08/23/2019 regarding this message.

## 2019-08-23 NOTE — Telephone Encounter (Signed)
He's needing a refill already? Or just asking about side effects?

## 2019-08-23 NOTE — Telephone Encounter (Signed)
Does he need to stop both?

## 2019-08-23 NOTE — Telephone Encounter (Signed)
They both can, albeit <5% for each.

## 2019-08-23 NOTE — Telephone Encounter (Signed)
Patient called in to let Dr. Nani Ravens know that he has had a bad reation from his medication. The patient would like a follow up  Call at [phone]

## 2019-08-23 NOTE — Telephone Encounter (Signed)
Caller : [name]  Call Back # [phone]  Patient states that his legs and swellings and wondering if this is an affect from medication he is taking. Please advise      HYDROcodone-acetaminophen (NORCO/VICODIN) 5-325 MG tablet [[id number]]   meloxicam (MOBIC) 15 MG tablet [[id number]

## 2019-08-24 ENCOUNTER — Telehealth: Payer: Self-pay

## 2019-08-24 NOTE — Telephone Encounter (Signed)
After Hours call:   Caller states he is waiting on the office to call him back about his appt.

## 2019-08-24 NOTE — Telephone Encounter (Signed)
Called informed the patient of PCP instructions. He verbalized understanding of instructions. Stated he is feeling much better today and will call back if symptoms worsen to schedule an appt.

## 2019-08-24 NOTE — Telephone Encounter (Signed)
Have spoken to the patient this morning regarding message for medication reaction///possible need for appt. Please see note from 08/23/2019 where documented need for call. The patient is doing much better today and verbalized understanding of PCP instructions regarding medication and will call for appointment if need to.

## 2019-09-01 ENCOUNTER — Other Ambulatory Visit: Payer: Self-pay

## 2019-09-06 ENCOUNTER — Telehealth: Payer: Medicare Other | Admitting: *Deleted

## 2019-09-06 ENCOUNTER — Ambulatory Visit: Payer: Medicare Other | Admitting: *Deleted

## 2019-09-08 NOTE — Progress Notes (Signed)
Nurse connected with patient 09/09/19 at  9:30 AM EDT by a telephone enabled telemedicine application and verified that I am speaking with the correct person using two identifiers. Patient stated full name and DOB. Patient gave permission to continue with virtual visit. Patient's location was at home and Nurse's location was at [place] office.   Subjective:   Bill Fox is a 79 y.o. male who presents for Medicare Annual/Subsequent preventive examination.  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  Pt reports he and wife recently separated and would like to get back together. Reports he talks w/ his Sunday school Bill Fox and they are working on things.  Review of Systems:  Home Safety/Smoke Alarms: Feels safe in home. Smoke alarms in place.  Lives alone w/ cat. 1 story home w/ basement.   Male:   CCS-   07/09/17. Recall 3 years.  PSA-  Lab Results  Component Value Date   PSA 0.78 06/28/2010       Objective:    Vitals: Unable to assess. This visit is enabled though telemedicine due to Covid 19.   Advanced Directives 09/09/2019 09/03/2018 09/01/2017 07/12/2015 05/01/2015 01/27/2015 01/13/2015  Does Patient Have a Medical Advance Directive? No No No No No Yes No  Would patient like information on creating a medical advance directive? No - Patient declined Yes (MAU/Ambulatory/Procedural Areas - Information given) Yes (MAU/Ambulatory/Procedural Areas - Information given) No - patient declined information - - No - patient declined information    Tobacco Social History   Tobacco Use  Smoking Status Former Smoker  . Packs/day: 1.00  . Years: 30.00  . Pack years: 30.00  . Types: Cigarettes  . Quit date: 05/20/1993  . Years since quitting: 26.3  Smokeless Tobacco Never Used     Counseling given: Not Answered   Clinical Intake:     Pain : No/denies pain     Past Medical History:  Diagnosis Date  . Anxiety   . At risk for sleep apnea    STOP-BANG= 5     SENT TO PCP  11-29-2013  . Chronic fatigue   . DJD (degenerative joint disease)    RIGHT KNEE  . History of colon polyps    2011  &  2013 --  ADENOMATOUS  . History of pulmonary embolism   . Hyperlipidemia    pt denies ever having high chol  . Hypertension   . Right knee meniscal tear   . Ulcerative colitis    Past Surgical History:  Procedure Laterality Date  . CATARACT EXTRACTION W/ INTRAOCULAR LENS IMPLANT Left 2014  . COLONOSCOPY W/ POLYPECTOMY  MULTIPLE--  LAST ONE 09-12-2011  . KNEE ARTHROSCOPY WITH DRILLING/MICROFRACTURE Right 12/03/2013   Procedure: RIGHT KNEE ARTHROSCOPY PARTIAL MEDIAL MENISCECTOMY DEBRIDEMENT SYNOVECTOMY;  Surgeon: Johnn Hai, MD;  Location: [facility];  Service: Orthopedics;  Laterality: Right;  . NASAL FRACTURE SURGERY  1985  . TRANSTHORACIC ECHOCARDIOGRAM  11-01-2008   MILD LVH/  EF 21%/  GRADE I DIASTOLIC DYSFUNCTION/  MILD LAE   Family History  Problem Relation Age of Onset  . Diabetes Mother   . Cancer Father        unknown type  . Stomach cancer Father   . Lung cancer Brother   . Diabetes Paternal Uncle        x 2  . Colon cancer Neg Hx    Social History   Socioeconomic History  . Marital status: Married    Spouse name: Not on file  .  Number of children: Not on file  . Years of education: Not on file  . Highest education level: Not on file  Occupational History  . Occupation: Armed forces operational officer  Tobacco Use  . Smoking status: Former Smoker    Packs/day: 1.00    Years: 30.00    Pack years: 30.00    Types: Cigarettes    Quit date: 05/20/1993    Years since quitting: 26.3  . Smokeless tobacco: Never Used  Substance and Sexual Activity  . Alcohol use: Yes    Alcohol/week: 1.0 standard drinks    Types: 1 Cans of beer per week    Comment: beer on occ  . Drug use: No  . Sexual activity: Yes  Other Topics Concern  . Not on file  Social History Narrative  . Not on file   Social Determinants of Health   Financial Resource  Strain: Low Risk   . Difficulty of Paying Living Expenses: Not hard at all  Food Insecurity: No Food Insecurity  . Worried About Charity fundraiser in the Last Year: Never true  . Ran Out of Food in the Last Year: Never true  Transportation Needs: No Transportation Needs  . Lack of Transportation (Medical): No  . Lack of Transportation (Non-Medical): No  Physical Activity:   . Days of Exercise per Week:   . Minutes of Exercise per Session:   Stress:   . Feeling of Stress :   Social Connections:   . Frequency of Communication with Friends and Family:   . Frequency of Social Gatherings with Friends and Family:   . Attends Religious Services:   . Active Member of Clubs or Organizations:   . Attends Archivist Meetings:   Marland Kitchen Marital Status:     Outpatient Encounter Medications as of 09/09/2019  Medication Sig  . doxazosin (CARDURA) 8 MG tablet TAKE ONE-HALF (1/2) TABLET AT BEDTIME  . gabapentin (NEURONTIN) 100 MG capsule Take 1-2 caps in early afternoon and 2 caps at bedtime  . mercaptopurine (PURINETHOL) 50 MG tablet TAKE 3 TABLETS DAILY ON AN EMPTY STOMACH 1 HOUR BEFORE OR 2 HOURS AFTER A MEAL (NEED OFFICE VISIT FOR FURTHER REFILLS)  . Mesalamine (DELZICOL) 400 MG CPDR DR capsule TAKE 4 CAPSULES THREE TIMES A DAY;  Patient needs office visit for further refills  . mirtazapine (REMERON) 30 MG tablet TAKE 1 TABLET AT BEDTIME  . meloxicam (MOBIC) 15 MG tablet Take 1 tablet (15 mg total) by mouth daily. (Patient not taking: Reported on 09/09/2019)   No facility-administered encounter medications on file as of 09/09/2019.    Activities of Daily Living In your present state of health, do you have any difficulty performing the following activities: 09/09/2019  Hearing? N  Vision? N  Difficulty concentrating or making decisions? N  Walking or climbing stairs? N  Dressing or bathing? N  Doing errands, shopping? N  Preparing Food and eating ? N  Using the Toilet? N  In the past  six months, have you accidently leaked urine? N  Do you have problems with loss of bowel control? N  Managing your Medications? N  Managing your Finances? N  Housekeeping or managing your Housekeeping? N  Some recent data might be hidden    Patient Care Team: [name], DO as PCP - General (Family Medicine)   Assessment:   This is a routine wellness examination for Bill Fox. Physical assessment deferred to PCP.  Exercise Activities and Dietary recommendations Current Exercise Habits: Home  exercise routine, Type of exercise: treadmill, Time (Minutes): 35, Frequency (Times/Week): 7, Weekly Exercise (Minutes/Week): 245, Intensity: Mild, Exercise limited by: None identified Diet (meal preparation, eat out, water intake, caffeinated beverages, dairy products, fruits and vegetables): in general, a "healthy" diet  , well balanced     Goals    . Weight (lb) < 235 lb (106.6 kg) (pt-stated)     Eating well and exercising       Fall Risk Fall Risk  09/09/2019 09/03/2018 09/01/2017  Falls in the past year? 1 0 No  Number falls in past yr: 0 - -  Injury with Fall? 1 - -  Follow up Education provided;Falls prevention discussed - -    Depression Screen PHQ 2/9 Scores 09/09/2019 09/03/2018 09/01/2017  PHQ - 2 Score 0 0 0    Cognitive Function Ad8 score reviewed for issues:  Issues making decisions:no  Less interest in hobbies / activities:no  Repeats questions, stories (family complaining):no  Trouble using ordinary gadgets (microwave, computer, phone):no  Forgets the month or year: no  Mismanaging finances: no  Remembering appts:no  Daily problems with thinking and/or memory:no Ad8 score is=0     MMSE - Mini Mental State Exam 09/01/2017  Orientation to time 5  Orientation to Place 5  Registration 3  Attention/ Calculation 5  Recall 3  Language- name 2 objects 2  Language- repeat 1  Language- follow 3 step command 3  Language- read & follow direction 1    Write a sentence 1  Copy design 1  Total score 30        Immunization History  Administered Date(s) Administered  . Influenza Split 02/18/2016  . Influenza Whole 02/16/2009, 01/31/2010  . Influenza, High Dose Seasonal PF 03/11/2018  . Influenza,inj,Quad PF,6+ Mos 03/03/2014, 01/30/2015  . Janssen (J&J) SARS-COV-2 Vaccination 07/29/2019  . Pneumococcal Conjugate-13 01/30/2015  . Pneumococcal Polysaccharide-23 06/20/2008  . Tdap 09/03/2011   Screening Tests Health Maintenance  Topic Date Due  . INFLUENZA VACCINE  12/19/2019  . COLONOSCOPY  07/09/2020  . TETANUS/TDAP  09/02/2021  . COVID-19 Vaccine  Completed  . PNA vac Low Risk Adult  Completed     Plan:   See you next year.  Continue to eat heart healthy diet (full of fruits, vegetables, whole grains, lean protein, water--limit salt, fat, and sugar intake) and increase physical activity as tolerated.  Continue doing brain stimulating activities (puzzles, reading, adult coloring books, staying active) to keep memory sharp.    I have personally reviewed and noted the following in the patient's chart:   . Medical and social history . Use of alcohol, tobacco or illicit drugs  . Current medications and supplements . Functional ability and status . Nutritional status . Physical activity . Advanced directives . List of other physicians . Hospitalizations, surgeries, and ER visits in previous 12 months . Vitals . Screenings to include cognitive, depression, and falls . Referrals and appointments  In addition, I have reviewed and discussed with patient certain preventive protocols, quality metrics, and best practice recommendations. A written personalized care plan for preventive services as well as general preventive health recommendations were provided to patient.     Naaman Plummer [place], [place]  09/09/2019

## 2019-09-09 ENCOUNTER — Encounter: Payer: Self-pay | Admitting: *Deleted

## 2019-09-09 ENCOUNTER — Ambulatory Visit (INDEPENDENT_AMBULATORY_CARE_PROVIDER_SITE_OTHER): Payer: Medicare Other | Admitting: *Deleted

## 2019-09-09 ENCOUNTER — Other Ambulatory Visit: Payer: Self-pay

## 2019-09-09 ENCOUNTER — Telehealth: Payer: Medicare Other | Admitting: *Deleted

## 2019-09-09 DIAGNOSIS — Z0000 Encounter for general adult medical examination without abnormal findings: Secondary | ICD-10-CM | POA: Diagnosis not present

## 2019-09-09 NOTE — Patient Instructions (Signed)
See you next year.  Continue to eat heart healthy diet (full of fruits, vegetables, whole grains, lean protein, water--limit salt, fat, and sugar intake) and increase physical activity as tolerated.  Continue doing brain stimulating activities (puzzles, reading, adult coloring books, staying active) to keep memory sharp.    Bill Fox , Thank you for taking time to come for your Medicare Wellness Visit. I appreciate your ongoing commitment to your health goals. Please review the following plan we discussed and let me know if I can assist you in the future.   These are the goals we discussed: Goals    . Weight (lb) < 235 lb (106.6 kg) (pt-stated)     Eating well and exercising       This is a list of the screening recommended for you and due dates:  Health Maintenance  Topic Date Due  . Flu Shot  12/19/2019  . Colon Cancer Screening  07/09/2020  . Tetanus Vaccine  09/02/2021  . COVID-19 Vaccine  Completed  . Pneumonia vaccines  Completed    Preventive Care 22 Years and Older, Male Preventive care refers to lifestyle choices and visits with your health care provider that can promote health and wellness. This includes:  A yearly physical exam. This is also called an annual well check.  Regular dental and eye exams.  Immunizations.  Screening for certain conditions.  Healthy lifestyle choices, such as diet and exercise. What can I expect for my preventive care visit? Physical exam Your health care provider will check:  Height and weight. These may be used to calculate body mass index (BMI), which is a measurement that tells if you are at a healthy weight.  Heart rate and blood pressure.  Your skin for abnormal spots. Counseling Your health care provider may ask you questions about:  Alcohol, tobacco, and drug use.  Emotional well-being.  Home and relationship well-being.  Sexual activity.  Eating habits.  History of falls.  Memory and ability to understand  (cognition).  Work and work Statistician. What immunizations do I need?  Influenza (flu) vaccine  This is recommended every year. Tetanus, diphtheria, and pertussis (Tdap) vaccine  You may need a Td booster every 10 years. Varicella (chickenpox) vaccine  You may need this vaccine if you have not already been vaccinated. Zoster (shingles) vaccine  You may need this after age 77. Pneumococcal conjugate (PCV13) vaccine  One dose is recommended after age 29. Pneumococcal polysaccharide (PPSV23) vaccine  One dose is recommended after age 49. Measles, mumps, and rubella (MMR) vaccine  You may need at least one dose of MMR if you were born in 1957 or later. You may also need a second dose. Meningococcal conjugate (MenACWY) vaccine  You may need this if you have certain conditions. Hepatitis A vaccine  You may need this if you have certain conditions or if you travel or work in places where you may be exposed to hepatitis A. Hepatitis B vaccine  You may need this if you have certain conditions or if you travel or work in places where you may be exposed to hepatitis B. Haemophilus influenzae type b (Hib) vaccine  You may need this if you have certain conditions. You may receive vaccines as individual doses or as more than one vaccine together in one shot (combination vaccines). Talk with your health care provider about the risks and benefits of combination vaccines. What tests do I need? Blood tests  Lipid and cholesterol levels. These may be checked every  5 years, or more frequently depending on your overall health.  Hepatitis C test.  Hepatitis B test. Screening  Lung cancer screening. You may have this screening every year starting at age 101 if you have a 30-pack-year history of smoking and currently smoke or have quit within the past 15 years.  Colorectal cancer screening. All adults should have this screening starting at age 33 and continuing until age 55. Your health  care provider may recommend screening at age 15 if you are at increased risk. You will have tests every 1-10 years, depending on your results and the type of screening test.  Prostate cancer screening. Recommendations will vary depending on your family history and other risks.  Diabetes screening. This is done by checking your blood sugar (glucose) after you have not eaten for a while (fasting). You may have this done every 1-3 years.  Abdominal aortic aneurysm (AAA) screening. You may need this if you are a current or former smoker.  Sexually transmitted disease (STD) testing. Follow these instructions at home: Eating and drinking  Eat a diet that includes fresh fruits and vegetables, whole grains, lean protein, and low-fat dairy products. Limit your intake of foods with high amounts of sugar, saturated fats, and salt.  Take vitamin and mineral supplements as recommended by your health care provider.  Do not drink alcohol if your health care provider tells you not to drink.  If you drink alcohol: ? Limit how much you have to 0-2 drinks a day. ? Be aware of how much alcohol is in your drink. In the [place] one drink equals one 12 oz bottle of beer (355 mL), one 5 oz glass of wine (148 mL), or one 1 oz glass of hard liquor (44 mL). Lifestyle  Take daily care of your teeth and gums.  Stay active. Exercise for at least 30 minutes on 5 or more days each week.  Do not use any products that contain nicotine or tobacco, such as cigarettes, e-cigarettes, and chewing tobacco. If you need help quitting, ask your health care provider.  If you are sexually active, practice safe sex. Use a condom or other form of protection to prevent STIs (sexually transmitted infections).  Talk with your health care provider about taking a low-dose aspirin or statin. What's next?  Visit your health care provider once a year for a well check visit.  Ask your health care provider how often you should have your  eyes and teeth checked.  Stay up to date on all vaccines. This information is not intended to replace advice given to you by your health care provider. Make sure you discuss any questions you have with your health care provider. Document Revised: 04/30/2018 Document Reviewed: 04/30/2018 Elsevier Patient Education  2020 [employer].

## 2019-10-05 ENCOUNTER — Telehealth: Payer: Self-pay

## 2019-10-05 ENCOUNTER — Telehealth: Payer: Self-pay | Admitting: Family Medicine

## 2019-10-05 MED ORDER — MIRTAZAPINE 30 MG PO TABS: 30.0000 mg | ORAL_TABLET | Freq: Every day | ORAL | 0 refills | Status: DC

## 2019-10-05 NOTE — Telephone Encounter (Signed)
mirtazapine (REMERON) 30 MG tablet [[id number]]     Patient is having medication sent to [facility]  Today and would like a new prescription sent to [facility] after today.

## 2019-10-05 NOTE — Telephone Encounter (Signed)
Why?

## 2019-10-05 NOTE — Telephone Encounter (Signed)
Medication: mirtazapine (REMERON) 30 MG tablet    Has the patient contacted their pharmacy? No. (If no, request that the patient contact the pharmacy for the refill.) (If yes, when and what did the pharmacy advise?)  Preferred Pharmacy (with phone number or street name):  [place], [place] Phone:  [phone]  Fax:  [phone]       Agent: Please be advised that RX refills may take up to 3 business days. We ask that you follow-up with your pharmacy.

## 2019-10-05 NOTE — Telephone Encounter (Signed)
Sent in to mail order.

## 2019-10-05 NOTE — Telephone Encounter (Signed)
[facility] in to [facility]

## 2019-10-05 NOTE — Telephone Encounter (Signed)
Pt called stating he needs a referral to dermatology

## 2019-10-06 NOTE — Telephone Encounter (Signed)
Patient states he has a spot on his right ear that wont heal , and patient states it has a scab but wont heal and has been there for 2-3 weeks and wants a mole removed form his left eye.

## 2019-10-06 NOTE — Telephone Encounter (Signed)
LM for pt to returncall

## 2019-10-06 NOTE — Telephone Encounter (Signed)
Happy to refer, we can usually take care of that too if he wishes. Up to him. Ty.

## 2019-10-11 NOTE — Telephone Encounter (Signed)
Patient has appointment with PCP this friday

## 2019-10-15 ENCOUNTER — Ambulatory Visit: Payer: Medicare Other | Admitting: Family Medicine

## 2019-10-19 ENCOUNTER — Ambulatory Visit (INDEPENDENT_AMBULATORY_CARE_PROVIDER_SITE_OTHER): Payer: Medicare Other | Admitting: Family Medicine

## 2019-10-19 ENCOUNTER — Encounter: Payer: Self-pay | Admitting: Family Medicine

## 2019-10-19 ENCOUNTER — Other Ambulatory Visit: Payer: Self-pay

## 2019-10-19 VITALS — BP 128/72 | HR 96 | Temp 95.1°F | Ht 72.0 in | Wt 253.1 lb

## 2019-10-19 DIAGNOSIS — D489 Neoplasm of uncertain behavior, unspecified: Secondary | ICD-10-CM

## 2019-10-19 DIAGNOSIS — L989 Disorder of the skin and subcutaneous tissue, unspecified: Secondary | ICD-10-CM | POA: Diagnosis not present

## 2019-10-19 DIAGNOSIS — C44222 Squamous cell carcinoma of skin of right ear and external auricular canal: Secondary | ICD-10-CM

## 2019-10-19 MED ORDER — FLUOROURACIL 5 % EX CREA: TOPICAL_CREAM | Freq: Two times a day (BID) | CUTANEOUS | 0 refills | Status: DC

## 2019-10-19 NOTE — Progress Notes (Signed)
Chief Complaint  Patient presents with  . Follow-up    spot on eyelid and right ear    Bill Fox is a 79 y.o. male here for a skin complaint.  Duration: 6 weeks Location: R ear Pruritic? No Painful? No Drainage? No New soaps/lotions/topicals/detergents? No Sick contacts? No Other associated symptoms: never healed Therapies tried thus far: none  There is another area below his L eye that is growing and dark; present for the past. No pain, drainage, new topicals, sick contacts. Has not put anything on it thus far.   Past Medical History:  Diagnosis Date  . Anxiety   . At risk for sleep apnea    STOP-BANG= 5     SENT TO PCP 11-29-2013  . Chronic fatigue   . DJD (degenerative joint disease)    RIGHT KNEE  . History of colon polyps    2011  &  2013 --  ADENOMATOUS  . History of pulmonary embolism   . Hyperlipidemia    pt denies ever having high chol  . Hypertension   . Right knee meniscal tear   . Ulcerative colitis     BP 128/72 (BP Location: Left Arm, Patient Position: Sitting, Cuff Size: Large)   Pulse 96   Temp (!) 95.1 F (35.1 C) (Temporal)   Ht 6' (1.829 m)   Wt 253 lb 2 oz (114.8 kg)   SpO2 95%   BMI 34.33 kg/m  Gen: awake, alert, appearing stated age Lungs: No accessory muscle use Skin: See below. No ttp, excessive warmth, fluctuance Psych: Age appropriate judgment and insight   R ear L lower eye region  Procedure note; shave biopsy Informed consent was obtained. The area was cleaned with alcohol and injected with 0.6 mL of 1% lidocaine without epinephrine. A Dermablade was slightly bent and used to cut under the area of interest. The specimen was placed in a sterile specimen cup and sent to the lab. The area was then cauterized ensuring adequate hemostasis. The area was dressed with triple antibiotic ointment and a bandage. There were no complications noted. The patient tolerated the procedure well.  Neoplasm of uncertain behavior - Plan:  Surgical pathology( [place]/ [facility])  Skin lesion - Plan: Surgical pathology( [place]/ [facility])  1- I am worried about a SCC. Biopsy today.  Aftercare instructions verbalized and written down. 2- Try above cream. May need to shave, though this one does not look concerning.  F/u prn. The patient voiced understanding and agreement to the plan.  [place], DO 10/19/19 12:29 PM

## 2019-10-19 NOTE — Patient Instructions (Addendum)
We can try the cream for the area on your lower left eye. If it doesn't work, our follow up will be based on hte results of the ear biopsy. If it is cancer, we will have you see a specialist. If not, we can follow up in around 1 month.   Do not shower for the rest of the day. When you do wash it, use only soap and water. Do not vigorously scrub. Apply triple antibiotic ointment (like Neosporin) twice daily. Keep the area clean and dry.   Things to look out for: increasing pain not relieved by ibuprofen/acetaminophen, fevers, spreading redness, drainage of pus, or foul odor.  Give [place] a week to get the results of your biopsy back.   Let us know if you need anything.

## 2019-10-21 ENCOUNTER — Other Ambulatory Visit: Payer: Self-pay | Admitting: Family Medicine

## 2019-10-21 DIAGNOSIS — C44222 Squamous cell carcinoma of skin of right ear and external auricular canal: Secondary | ICD-10-CM

## 2019-12-17 ENCOUNTER — Telehealth: Payer: Self-pay | Admitting: Internal Medicine

## 2019-12-17 MED ORDER — MESALAMINE 400 MG PO CPDR: DELAYED_RELEASE_CAPSULE | ORAL | 0 refills | Status: DC

## 2019-12-17 NOTE — Telephone Encounter (Signed)
Pt is requesting a refill on his delzicol

## 2019-12-17 NOTE — Telephone Encounter (Signed)
Refilled Delzicol

## 2020-01-10 ENCOUNTER — Telehealth: Payer: Self-pay | Admitting: Internal Medicine

## 2020-01-10 ENCOUNTER — Telehealth: Payer: Self-pay

## 2020-01-10 MED ORDER — MESALAMINE 400 MG PO CPDR: DELAYED_RELEASE_CAPSULE | ORAL | 1 refills | Status: DC

## 2020-01-10 MED ORDER — MIRTAZAPINE 30 MG PO TABS: 30.0000 mg | ORAL_TABLET | Freq: Every day | ORAL | 0 refills | Status: DC

## 2020-01-10 NOTE — Telephone Encounter (Signed)
Pt called requesting a refill on Mirtazapine (Remeron) 90 day supply.  Pharmacy is [facility].  I told him I was unsure if he would need a visit or not with PCP before refill could be sent in.

## 2020-01-10 NOTE — Telephone Encounter (Signed)
Refill done and patient informed

## 2020-01-10 NOTE — Telephone Encounter (Signed)
Refilled Mesalamine

## 2020-02-03 DIAGNOSIS — Z23 Encounter for immunization: Secondary | ICD-10-CM | POA: Diagnosis not present

## 2020-03-09 ENCOUNTER — Encounter: Payer: Self-pay | Admitting: Internal Medicine

## 2020-03-09 ENCOUNTER — Other Ambulatory Visit (INDEPENDENT_AMBULATORY_CARE_PROVIDER_SITE_OTHER): Payer: Medicare Other

## 2020-03-09 ENCOUNTER — Ambulatory Visit (INDEPENDENT_AMBULATORY_CARE_PROVIDER_SITE_OTHER): Payer: Medicare Other | Admitting: Internal Medicine

## 2020-03-09 VITALS — BP 118/72 | HR 82 | Ht 72.0 in | Wt 263.0 lb

## 2020-03-09 DIAGNOSIS — K51919 Ulcerative colitis, unspecified with unspecified complications: Secondary | ICD-10-CM

## 2020-03-09 LAB — CBC WITH DIFFERENTIAL/PLATELET
Basophils Absolute: 0.1 10*3/uL (ref 0.0–0.1)
Basophils Relative: 1.3 % (ref 0.0–3.0)
Eosinophils Absolute: 0.2 10*3/uL (ref 0.0–0.7)
Eosinophils Relative: 3.1 % (ref 0.0–5.0)
HCT: 46.2 % (ref 39.0–52.0)
Hemoglobin: 15.5 g/dL (ref 13.0–17.0)
Lymphocytes Relative: 21.3 % (ref 12.0–46.0)
Lymphs Abs: 1.6 10*3/uL (ref 0.7–4.0)
MCHC: 33.5 g/dL (ref 30.0–36.0)
MCV: 85.9 fl (ref 78.0–100.0)
Monocytes Absolute: 0.9 10*3/uL (ref 0.1–1.0)
Monocytes Relative: 12.4 % — ABNORMAL HIGH (ref 3.0–12.0)
Neutro Abs: 4.6 10*3/uL (ref 1.4–7.7)
Neutrophils Relative %: 61.9 % (ref 43.0–77.0)
Platelets: 213 10*3/uL (ref 150.0–400.0)
RBC: 5.38 Mil/uL (ref 4.22–5.81)
RDW: 14 % (ref 11.5–15.5)
WBC: 7.4 10*3/uL (ref 4.0–10.5)

## 2020-03-09 MED ORDER — MESALAMINE 400 MG PO CPDR: DELAYED_RELEASE_CAPSULE | ORAL | 3 refills | Status: DC

## 2020-03-09 NOTE — Patient Instructions (Signed)
We have sent the following medications to your pharmacy for you to pick up at your convenience:  Mesalamine  Please follow up in one year

## 2020-03-09 NOTE — Progress Notes (Signed)
HISTORY OF PRESENT ILLNESS:  Bill Fox is a 79 y.o. male, retired marine, with hypertension, hyperlipidemia, remote pulmonary embolus, chronic left-sided ulcerative colitis in remission, right-sided adenomatous colon polyp, and obesity. Was last seen in the office January 2019. He last underwent surveillance colonoscopy February 2019. The examination was normal. Surveillance biopsies were negative for active disease or dysplasia. He has not been seen since. He tells me that he has been doing well. Regular bowel habits. No abdominal pain. No bleeding. He ran out of his 6-mercaptopurine 3 to 6 months ago. He has continued on mesalamine in the form of Delzicol 1.6 g 3 times daily. Presents today regarding medication refill. Has not had recent blood work. Last blood work May 2019 revealed unremarkable CBC and comprehensive metabolic panel. He has completed his Covid vaccination series  REVIEW OF SYSTEMS:  All non-GI ROS negative unless otherwise stated in the HPI except for anxiety, sleeping problems  Past Medical History:  Diagnosis Date  . Anxiety   . At risk for sleep apnea    STOP-BANG= 5     SENT TO PCP 11-29-2013  . Chronic fatigue   . DJD (degenerative joint disease)    RIGHT KNEE  . History of colon polyps    2011  &  2013 --  ADENOMATOUS  . History of pulmonary embolism   . Hyperlipidemia    pt denies ever having high chol  . Hypertension   . Right knee meniscal tear   . Ulcerative colitis     Past Surgical History:  Procedure Laterality Date  . CATARACT EXTRACTION W/ INTRAOCULAR LENS IMPLANT Left 2014  . COLONOSCOPY W/ POLYPECTOMY  MULTIPLE--  LAST ONE 09-12-2011  . KNEE ARTHROSCOPY WITH DRILLING/MICROFRACTURE Right 12/03/2013   Procedure: RIGHT KNEE ARTHROSCOPY PARTIAL MEDIAL MENISCECTOMY DEBRIDEMENT SYNOVECTOMY;  Surgeon: Johnn Hai, MD;  Location: [facility];  Service: Orthopedics;  Laterality: Right;  . NASAL FRACTURE SURGERY  1985  .  TRANSTHORACIC ECHOCARDIOGRAM  11-01-2008   MILD LVH/  EF 38%/  GRADE I DIASTOLIC DYSFUNCTION/  MILD LAE    Social History NYQUAN SELBE  reports that he quit smoking about 26 years ago. His smoking use included cigarettes. He has a 30.00 pack-year smoking history. He has never used smokeless tobacco. He reports current alcohol use of about 1.0 standard drink of alcohol per week. He reports that he does not use drugs.  family history includes Diabetes in his mother and paternal uncle; Lung cancer in his brother; Stomach cancer in his father.  No Known Allergies     PHYSICAL EXAMINATION: Vital signs: BP 118/72   Pulse 82   Ht 6' (1.829 m)   Wt 263 lb (119.3 kg)   BMI 35.67 kg/m   Constitutional: generally well-appearing, no acute distress Psychiatric: alert and oriented x3, cooperative Eyes: extraocular movements intact, anicteric, conjunctiva pink Mouth: oral pharynx moist, no lesions Neck: supple no lymphadenopathy Cardiovascular: heart regular rate and rhythm, no murmur Lungs: clear to auscultation bilaterally Abdomen: soft, nontender, nondistended, no obvious ascites, no peritoneal signs, normal bowel sounds, no organomegaly Rectal: Omitted Extremities: no clubbing, cyanosis, or lower extremity edema bilaterally Skin: no lesions on visible extremities Neuro: No focal deficits. Cranial nerves intact  ASSESSMENT:  1. History of left-sided ulcerative colitis. In deep clinical and histologic remission. Last colonoscopy February 2019. Has remained on chronic mesalamine therapy. Has been off 6-mercaptopurine for some time. 2. History of sporadic adenoma 3. General medical problems. Stable   PLAN:  1. Refill mesalamine at current dose. Continue. 2. At this point, he has done well off 6-mercaptopurine. Elected to keep him off of this drug at this time 3. Will be due for surveillance colonoscopy around February 2022. He is aware. Interval follow-up as needed.

## 2020-03-10 LAB — COMPREHENSIVE METABOLIC PANEL
ALT: 17 U/L (ref 0–53)
AST: 19 U/L (ref 0–37)
Albumin: 4.2 g/dL (ref 3.5–5.2)
Alkaline Phosphatase: 57 U/L (ref 39–117)
BUN: 15 mg/dL (ref 6–23)
CO2: 28 mEq/L (ref 19–32)
Calcium: 9.3 mg/dL (ref 8.4–10.5)
Chloride: 105 mEq/L (ref 96–112)
Creatinine, Ser: 1.25 mg/dL (ref 0.40–1.50)
GFR: 60 mL/min — ABNORMAL LOW (ref >60.00)
Glucose, Bld: 97 mg/dL (ref 70–99)
Potassium: 4.7 mEq/L (ref 3.5–5.1)
Sodium: 141 mEq/L (ref 135–145)
Total Bilirubin: 0.4 mg/dL (ref 0.2–1.2)
Total Protein: 7.7 g/dL (ref 6.0–8.3)

## 2020-03-22 ENCOUNTER — Telehealth: Payer: Self-pay | Admitting: Internal Medicine

## 2020-03-22 DIAGNOSIS — D225 Melanocytic nevi of trunk: Secondary | ICD-10-CM | POA: Diagnosis not present

## 2020-03-22 DIAGNOSIS — L820 Inflamed seborrheic keratosis: Secondary | ICD-10-CM | POA: Diagnosis not present

## 2020-03-22 DIAGNOSIS — L814 Other melanin hyperpigmentation: Secondary | ICD-10-CM | POA: Diagnosis not present

## 2020-03-22 DIAGNOSIS — L821 Other seborrheic keratosis: Secondary | ICD-10-CM | POA: Diagnosis not present

## 2020-03-22 DIAGNOSIS — Z85828 Personal history of other malignant neoplasm of skin: Secondary | ICD-10-CM | POA: Diagnosis not present

## 2020-03-22 DIAGNOSIS — D0462 Carcinoma in situ of skin of left upper limb, including shoulder: Secondary | ICD-10-CM | POA: Diagnosis not present

## 2020-03-22 DIAGNOSIS — L570 Actinic keratosis: Secondary | ICD-10-CM | POA: Diagnosis not present

## 2020-03-22 MED ORDER — MESALAMINE 400 MG PO CPDR
DELAYED_RELEASE_CAPSULE | ORAL | 3 refills | Status: DC
Start: 2020-03-22 — End: 2020-07-12

## 2020-03-22 NOTE — Telephone Encounter (Signed)
Refilled Delzicol through [facility]

## 2020-04-05 ENCOUNTER — Telehealth: Payer: Self-pay | Admitting: Internal Medicine

## 2020-04-05 NOTE — Telephone Encounter (Signed)
Pt called stating that he has not had a bm since 5 days ago. He would like some advise.

## 2020-04-05 NOTE — Telephone Encounter (Signed)
Spoke with pt and he states since Saturday he has only passed a tiny bit of stool. Discussed with pt that he can do a miralax purge and that should work for him. Instructed him to call back with any further problems.

## 2020-04-24 ENCOUNTER — Telehealth: Payer: Self-pay | Admitting: Internal Medicine

## 2020-04-24 NOTE — Telephone Encounter (Signed)
Spoke with pt and he has been taking miralax but not daily. States some days he has a BM and some he dose not. Discussed with pt that he can take 1-3 doses daily to have BM. Offered pt an appt but he states he will try the miralax daily and call back if he needs to schedule an appt.

## 2020-05-06 ENCOUNTER — Other Ambulatory Visit: Payer: Self-pay | Admitting: Family Medicine

## 2020-05-08 ENCOUNTER — Telehealth: Payer: Self-pay | Admitting: Internal Medicine

## 2020-05-08 ENCOUNTER — Telehealth: Payer: Self-pay | Admitting: Family Medicine

## 2020-05-08 MED ORDER — MIRTAZAPINE 30 MG PO TABS
30.0000 mg | ORAL_TABLET | Freq: Every day | ORAL | 0 refills | Status: DC
Start: 2020-05-08 — End: 2021-04-17

## 2020-05-08 NOTE — Telephone Encounter (Signed)
Patient states it takes a long time for express script . He need a 30 days supply   mirtazapine (REMERON) 30 MG tablet [[id number]]    [facility] [place], [place]  [address] [place] [address]  Phone:  [phone] Fax:  [phone]

## 2020-05-08 NOTE — Telephone Encounter (Signed)
Pt has called the past 2 months with constipation. He has used miralax but is not using it regularly. Pt reports that now he is seeing some blood in his stool. Pts appt moved up to 05/23/20@3 :20pm. Pt aware of appt.

## 2020-05-08 NOTE — Telephone Encounter (Signed)
Refill done and patient notified

## 2020-05-23 ENCOUNTER — Encounter: Payer: Self-pay | Admitting: Internal Medicine

## 2020-05-23 ENCOUNTER — Ambulatory Visit (INDEPENDENT_AMBULATORY_CARE_PROVIDER_SITE_OTHER): Payer: Medicare Other | Admitting: Internal Medicine

## 2020-05-23 VITALS — BP 138/80 | HR 88 | Ht 72.0 in | Wt 266.0 lb

## 2020-05-23 DIAGNOSIS — R194 Change in bowel habit: Secondary | ICD-10-CM

## 2020-05-23 DIAGNOSIS — K51919 Ulcerative colitis, unspecified with unspecified complications: Secondary | ICD-10-CM

## 2020-05-23 DIAGNOSIS — K625 Hemorrhage of anus and rectum: Secondary | ICD-10-CM

## 2020-05-23 MED ORDER — SUTAB 1479-225-188 MG PO TABS: 1.0000 | ORAL_TABLET | Freq: Once | ORAL | 0 refills | Status: AC

## 2020-05-23 NOTE — Patient Instructions (Signed)
You have been scheduled for a colonoscopy. Please follow written instructions given to you at your visit today.  Please pick up your prep supplies at the pharmacy within the next 1-3 days. If you use inhalers (even only as needed), please bring them with you on the day of your procedure.

## 2020-05-23 NOTE — Progress Notes (Signed)
HISTORY OF PRESENT ILLNESS:  Bill Fox is a 80 y.o. male with past medical history as listed below who has been followed in this office for history of left-sided ulcerative colitis.  He was last evaluated March 09, 2020.  At that time he was felt to be in deep clinical and histologic remission.  Last colonoscopy February 2019.  He continues on Delzicol 4.8 g daily.  Tells me that approximately 1 month ago he developed problems with constipation.  Did notice some blood per rectum.  He did try MiraLAX with subsequent passage of fluid and gas.  This represents a change from his previous bowel habits.  No abdominal pain.  No fevers.  He has completed his Covid vaccination series and booster.  Blood work from March 09, 2020 shows normal comprehensive metabolic panel and normal CBC  REVIEW OF SYSTEMS:  All non-GI ROS negative unless otherwise stated in the HPI except for anxiety, arthritis  Past Medical History:  Diagnosis Date  . Anxiety   . At risk for sleep apnea    STOP-BANG= 5     SENT TO PCP 11-29-2013  . Chronic fatigue   . DJD (degenerative joint disease)    RIGHT KNEE  . History of colon polyps    2011  &  2013 --  ADENOMATOUS  . History of pulmonary embolism   . Hyperlipidemia    pt denies ever having high chol  . Hypertension   . Right knee meniscal tear   . Ulcerative colitis     Past Surgical History:  Procedure Laterality Date  . CATARACT EXTRACTION W/ INTRAOCULAR LENS IMPLANT Left 2014  . COLONOSCOPY W/ POLYPECTOMY  MULTIPLE--  LAST ONE 09-12-2011  . KNEE ARTHROSCOPY WITH DRILLING/MICROFRACTURE Right 12/03/2013   Procedure: RIGHT KNEE ARTHROSCOPY PARTIAL MEDIAL MENISCECTOMY DEBRIDEMENT SYNOVECTOMY;  Surgeon: Johnn Hai, MD;  Location: [facility];  Service: Orthopedics;  Laterality: Right;  . NASAL FRACTURE SURGERY  1985  . TRANSTHORACIC ECHOCARDIOGRAM  11-01-2008   MILD LVH/  EF 76%/  GRADE I DIASTOLIC DYSFUNCTION/  MILD LAE    Social  History SHALON COUNCILMAN  reports that he quit smoking about 27 years ago. His smoking use included cigarettes. He has a 30.00 pack-year smoking history. He has never used smokeless tobacco. He reports current alcohol use of about 1.0 standard drink of alcohol per week. He reports that he does not use drugs.  family history includes Diabetes in his mother and paternal uncle; Lung cancer in his brother; Stomach cancer in his father.  No Known Allergies     PHYSICAL EXAMINATION: Vital signs: BP 138/80   Pulse 88   Ht 6' (1.829 m)   Wt 266 lb (120.7 kg)   BMI 36.08 kg/m   Constitutional: generally well-appearing, no acute distress Psychiatric: alert and oriented x3, cooperative Eyes: extraocular movements intact, anicteric, conjunctiva pink Mouth: oral pharynx moist, no lesions Neck: supple no lymphadenopathy Cardiovascular: heart regular rate and rhythm, no murmur Lungs: clear to auscultation bilaterally Abdomen: soft, obese, nontender, nondistended, no obvious ascites, no peritoneal signs, normal bowel sounds, no organomegaly Rectal: Deferred to colonoscopy Extremities: no clubbing or cyanosis.  1+ lower extremity edema bilaterally Skin: no lesions on visible extremities Neuro: No focal deficits.  Cranial nerves intact  ASSESSMENT:  1.  History of left-sided ulcerative colitis.  On Delzicol therapy.  Recent change in bowel habits with some bleeding.  Coincidentally due for his surveillance colonoscopy next month.   PLAN:  1.  Schedule colonoscopy to evaluate change in bowel habits, bleeding, and provide surveillance.The nature of the procedure, as well as the risks, benefits, and alternatives were carefully and thoroughly reviewed with the patient. Ample time for discussion and questions allowed. The patient understood, was satisfied, and agreed to proceed.

## 2020-05-30 ENCOUNTER — Ambulatory Visit (AMBULATORY_SURGERY_CENTER): Payer: Medicare Other | Admitting: Internal Medicine

## 2020-05-30 ENCOUNTER — Encounter: Payer: Self-pay | Admitting: Internal Medicine

## 2020-05-30 ENCOUNTER — Other Ambulatory Visit: Payer: Self-pay

## 2020-05-30 VITALS — BP 120/71 | HR 71 | Temp 97.5°F | Resp 14 | Ht 72.0 in | Wt 266.0 lb

## 2020-05-30 DIAGNOSIS — K529 Noninfective gastroenteritis and colitis, unspecified: Secondary | ICD-10-CM | POA: Diagnosis not present

## 2020-05-30 DIAGNOSIS — K51919 Ulcerative colitis, unspecified with unspecified complications: Secondary | ICD-10-CM

## 2020-05-30 DIAGNOSIS — Z1211 Encounter for screening for malignant neoplasm of colon: Secondary | ICD-10-CM | POA: Diagnosis not present

## 2020-05-30 DIAGNOSIS — R194 Change in bowel habit: Secondary | ICD-10-CM

## 2020-05-30 MED ORDER — SODIUM CHLORIDE 0.9 % IV SOLN: 500.0000 mL | Freq: Once | INTRAVENOUS | Status: DC

## 2020-05-30 MED ORDER — PREDNISONE 20 MG PO TABS: ORAL_TABLET | ORAL | 1 refills | Status: DC

## 2020-05-30 NOTE — Progress Notes (Signed)
Called to room to assist during endoscopic procedure.  Patient ID and intended procedure confirmed with present staff. Received instructions for my participation in the procedure from the performing physician.  

## 2020-05-30 NOTE — Patient Instructions (Signed)
HANDOUTS PROVIDED ON: ULCERATIVE COLITIS  The biopsies taken today have been sent for pathology.  The results can take 1-3 weeks to receive.    You may resume your previous diet and medication schedule.  A prescription has been sent to your pharmacy for Prednisone.  Take 40 mg (2 pills) daily for 2 weeks, then take 30 mg (1 1/2 pills) daily for 2 weeks, then take 20 mg (1 pill) daily until your follow up appointment with Dr. Henrene Pastor.  Thank you for allowing [place] to care for you today!!!   YOU HAD AN ENDOSCOPIC PROCEDURE TODAY AT [facility]:   Refer to the procedure report that was given to you for any specific questions about what was found during the examination.  If the procedure report does not answer your questions, please call your gastroenterologist to clarify.  If you requested that your care partner not be given the details of your procedure findings, then the procedure report has been included in a sealed envelope for you to review at your convenience later.  YOU SHOULD EXPECT: Some feelings of bloating in the abdomen. Passage of more gas than usual.  Walking can help get rid of the air that was put into your GI tract during the procedure and reduce the bloating. If you had a lower endoscopy (such as a colonoscopy or flexible sigmoidoscopy) you may notice spotting of blood in your stool or on the toilet paper. If you underwent a bowel prep for your procedure, you may not have a normal bowel movement for a few days.  Please Note:  You might notice some irritation and congestion in your nose or some drainage.  This is from the oxygen used during your procedure.  There is no need for concern and it should clear up in a day or so.  SYMPTOMS TO REPORT IMMEDIATELY:   Following lower endoscopy (colonoscopy or flexible sigmoidoscopy):  Excessive amounts of blood in the stool  Significant tenderness or worsening of abdominal pains  Swelling of the abdomen that is new,  acute  Fever of 100F or higher  For urgent or emergent issues, a gastroenterologist can be reached at any hour by calling [phone]. Do not use MyChart messaging for urgent concerns.    DIET:  We do recommend a small meal at first, but then you may proceed to your regular diet.  Drink plenty of fluids but you should avoid alcoholic beverages for 24 hours.  ACTIVITY:  You should plan to take it easy for the rest of today and you should NOT DRIVE or use heavy machinery until tomorrow (because of the sedation medicines used during the test).    FOLLOW UP: Our staff will call the number listed on your records Thursday morning between 7:15 am and 8:15 am to check on you and address any questions or concerns that you may have regarding the information given to you following your procedure. If we do not reach you, we will leave a message.  We will attempt to reach you two times.  During this call, we will ask if you have developed any symptoms of COVID 19. If you develop any symptoms (ie: fever, flu-like symptoms, shortness of breath, cough etc.) before then, please call [phone].  If you test positive for Covid 19 in the 2 weeks post procedure, please call and report this information to [place].    If any biopsies were taken you will be contacted by phone or by letter within the next  1-3 weeks.  Please call us at [phone] if you have not heard about the biopsies in 3 weeks.    SIGNATURES/CONFIDENTIALITY: You and/or your care partner have signed paperwork which will be entered into your electronic medical record.  These signatures attest to the fact that that the information above on your After Visit Summary has been reviewed and is understood.  Full responsibility of the confidentiality of this discharge information lies with you and/or your care-partner.

## 2020-05-30 NOTE — Op Note (Signed)
[facility] Patient Name: Bill Fox Procedure Date: 05/30/2020 9:29 AM MRN: [id number] Endoscopist: Docia Chuck. Henrene Pastor , MD Age: 80 Referring MD:  Date of Birth: 10/20/1940 Gender: Male Account #: [id number] Procedure:                Colonoscopy with biopsies Indications:              High risk colon cancer surveillance: Ulcerative                            left sided colitis of 8 (or more) years duration Medicines:                Monitored Anesthesia Care Procedure:                Pre-Anesthesia Assessment:                           - Prior to the procedure, a History and Physical                            was performed, and patient medications and                            allergies were reviewed. The patient's tolerance of                            previous anesthesia was also reviewed. The risks                            and benefits of the procedure and the sedation                            options and risks were discussed with the patient.                            All questions were answered, and informed consent                            was obtained. Prior Anticoagulants: The patient has                            taken no previous anticoagulant or antiplatelet                            agents. ASA Grade Assessment: II - A patient with                            mild systemic disease. After reviewing the risks                            and benefits, the patient was deemed in                            satisfactory condition to undergo the procedure.  After obtaining informed consent, the colonoscope                            was passed under direct vision. Throughout the                            procedure, the patient's blood pressure, pulse, and                            oxygen saturations were monitored continuously. The                            Olympus CF-HQ190L ([id number]) Colonoscope was                             introduced through the anus and advanced to the the                            cecum, identified by appendiceal orifice and                            ileocecal valve. The ileocecal valve, appendiceal                            orifice, and rectum were photographed. The quality                            of the bowel preparation was excellent. The                            colonoscopy was performed without difficulty. The                            patient tolerated the procedure well. The bowel                            preparation used was SUPREP via split dose                            instruction. Scope In: 9:41:00 AM Scope Out: 9:52:51 AM Scope Withdrawal Time: 0 hours 8 minutes 9 seconds  Total Procedure Duration: 0 hours 11 minutes 51 seconds  Findings:                 Moderately active ulcerative colitis was found                            beginning at the anal verge and extending                            approximately 35 to 40 cm into the sigmoid colon.                            This was marked by erythema, edema, friability,  granularity, and loss of haustral features.                            Biopsies were taken with a cold forceps for                            histology. Beyond this region in the left colon                            there was scarring but no active inflammation.                           The exam was otherwise without abnormality on                            direct and retroflexion views. Complications:            No immediate complications. Estimated blood loss:                            None. Estimated Blood Loss:     Estimated blood loss: none. Impression:               - Proctosigmoid ulcerative colitis. This was                            moderate in severity. Biopsied.                           - The examination was otherwise normal on direct                            and retroflexion views. Recommendation:            - Repeat colonoscopy in 2 years for surveillance.                           - Patient has a contact number available for                            emergencies. The signs and symptoms of potential                            delayed complications were discussed with the                            patient. Return to normal activities tomorrow.                            Written discharge instructions were provided to the                            patient.                           - Resume previous diet.                           -  Continue present medications.                           - Await pathology results.                           - Prescribed prednisone 20 mg; #60; 1 refill; take                            40 mg daily for 2 weeks, then 30 mg daily for 2                            weeks, then 20 mg daily until your follow-up office                            visit                           - Office follow-up in 6 weeks. Docia Chuck. Henrene Pastor, MD 05/30/2020 10:05:54 AM This report has been signed electronically.

## 2020-05-30 NOTE — Progress Notes (Signed)
Vitals by SP

## 2020-05-30 NOTE — Progress Notes (Signed)
A and O x3. Report to RN. Tolerated MAC anesthesia well.

## 2020-06-01 ENCOUNTER — Telehealth: Payer: Self-pay | Admitting: *Deleted

## 2020-06-01 ENCOUNTER — Telehealth: Payer: Self-pay

## 2020-06-01 NOTE — Telephone Encounter (Signed)
No answer for post procedure follow up. Unable to leave message will call back.

## 2020-06-01 NOTE — Telephone Encounter (Signed)
  Follow up Call-  Call back number 05/30/2020  Post procedure Call Back phone  # [phone]  Permission to leave phone message Yes  Some recent data might be hidden     Patient questions:  Do you have a fever, pain , or abdominal swelling? No. Pain Score  0 *  Have you tolerated food without any problems? Yes.    Have you been able to return to your normal activities? Yes.    Do you have any questions about your discharge instructions: Diet   No. Medications  No. Follow up visit  No.  Do you have questions or concerns about your Care? No.  Actions: * If pain score is 4 or above: No action needed, pain <4. 1. Have you developed a fever since your procedure? no  2.   Have you had an respiratory symptoms (SOB or cough) since your procedure? no  3.   Have you tested positive for COVID 19 since your procedure no  4.   Have you had any family members/close contacts diagnosed with the COVID 19 since your procedure?  no   If yes to any of these questions please route to Joylene John, RN and Joella Prince, RN

## 2020-06-02 ENCOUNTER — Encounter: Payer: Self-pay | Admitting: Internal Medicine

## 2020-06-22 ENCOUNTER — Ambulatory Visit: Payer: Medicare Other | Admitting: Internal Medicine

## 2020-07-06 ENCOUNTER — Telehealth: Payer: Self-pay | Admitting: Family Medicine

## 2020-07-06 NOTE — Telephone Encounter (Signed)
Left message for patient to reschedule Annual Wellness Visit.  Please schedule with Health Nurse Advisor Augustine Radar. at [facility].

## 2020-07-12 ENCOUNTER — Encounter: Payer: Self-pay | Admitting: Internal Medicine

## 2020-07-12 ENCOUNTER — Ambulatory Visit (INDEPENDENT_AMBULATORY_CARE_PROVIDER_SITE_OTHER): Payer: Medicare Other | Admitting: Internal Medicine

## 2020-07-12 VITALS — BP 144/80 | HR 96 | Ht 69.5 in | Wt 273.5 lb

## 2020-07-12 DIAGNOSIS — K51919 Ulcerative colitis, unspecified with unspecified complications: Secondary | ICD-10-CM | POA: Diagnosis not present

## 2020-07-12 MED ORDER — MESALAMINE 400 MG PO CPDR: DELAYED_RELEASE_CAPSULE | ORAL | 3 refills | Status: DC

## 2020-07-12 NOTE — Progress Notes (Signed)
HISTORY OF PRESENT ILLNESS:  Bill Fox is a 80 y.o. male with left-sided ulcerative colitis who was evaluated in this office May 23, 2020 regarding change in bowel habits and rectal bleeding.  At that time he was on Delzicol 4.8 g daily complete colonoscopy was performed May 30, 2000.  He was found to have active proctosigmoiditis to approximately 40 cm.  Biopsies revealed chronic active colitis without dysplasia or other abnormality.  He was continued on Delzicol.  He was prescribed prednisone taper beginning at 40 mg and tapering by 10 mg every 2 weeks.  He just completed his 6 weeks of therapy 3 days ago.  He is pleased to report that he has had normalization of his bowel habits.  He reports 1 formed bowel movement per day without bleeding or mucus.  No abdominal pain.  He did tolerate his steroids without issues.  No new complaints.  He needs medication refill.  REVIEW OF SYSTEMS:  All non-GI ROS negative unless otherwise stated in the HPI except for anxiety, ankle edema  Past Medical History:  Diagnosis Date  . Anxiety   . At risk for sleep apnea    STOP-BANG= 5     SENT TO PCP 11-29-2013  . Cataract   . Chronic fatigue   . DJD (degenerative joint disease)    RIGHT KNEE  . History of colon polyps    2011  &  2013 --  ADENOMATOUS  . History of pulmonary embolism   . Hyperlipidemia    pt denies ever having high chol  . Hypertension   . Right knee meniscal tear   . Ulcerative colitis     Past Surgical History:  Procedure Laterality Date  . CATARACT EXTRACTION W/ INTRAOCULAR LENS IMPLANT Left 2014  . COLONOSCOPY W/ POLYPECTOMY  MULTIPLE--  LAST ONE 09-12-2011  . KNEE ARTHROSCOPY WITH DRILLING/MICROFRACTURE Right 12/03/2013   Procedure: RIGHT KNEE ARTHROSCOPY PARTIAL MEDIAL MENISCECTOMY DEBRIDEMENT SYNOVECTOMY;  Surgeon: Johnn Hai, MD;  Location: [facility];  Service: Orthopedics;  Laterality: Right;  . NASAL FRACTURE SURGERY  1985  . TRANSTHORACIC  ECHOCARDIOGRAM  11-01-2008   MILD LVH/  EF 70%/  GRADE I DIASTOLIC DYSFUNCTION/  MILD LAE    Social History Bill Fox  reports that he quit smoking about 27 years ago. His smoking use included cigarettes. He has a 30.00 pack-year smoking history. He has never used smokeless tobacco. He reports current alcohol use of about 1.0 standard drink of alcohol per week. He reports that he does not use drugs.  family history includes Diabetes in his mother and paternal uncle; Lung cancer in his brother; Stomach cancer in his father.  No Known Allergies     PHYSICAL EXAMINATION: Vital signs: BP (!) 144/80 (BP Location: Left Arm, Patient Position: Sitting, Cuff Size: Normal)   Pulse 96   Ht 5' 9.5" (1.765 m) Comment: height measured without shoes  Wt 273 lb 8 oz (124.1 kg)   BMI 39.81 kg/m   Constitutional: Pleasant, generally well-appearing, no acute distress Psychiatric: alert and oriented x3, cooperative Eyes: extraocular movements intact, anicteric, conjunctiva pink Mouth: oral pharynx moist, no lesions Neck: supple no lymphadenopathy Cardiovascular: heart regular rate and rhythm, no murmur Lungs: clear to auscultation bilaterally Abdomen: soft, obese, nontender, nondistended, no obvious ascites, no peritoneal signs, normal bowel sounds, no organomegaly Rectal: Omitted Extremities: no clubbing or cyanosis.  1+ lower extremity edema bilaterally Skin: no lesions on visible extremities Neuro: No focal deficits.  Cranial nerves  intact  ASSESSMENT:  1.  Sided ulcerative colitis.  Recent flare has resolved after 6 weeks course of prednisone.   PLAN:  1.  Continue Delzicol 4.8 g daily.  Prescription refilled.  Medication risks reviewed. 2.  Routine office follow-up 6 months.  Contact the office in the interim for any questions or problems. 3.  Routine surveillance colonoscopy in about 2 years

## 2020-07-12 NOTE — Patient Instructions (Signed)
We have sent the following medications to your pharmacy for you to pick up at your convenience:  Delzicol  Please follow up in 6 months

## 2020-07-24 ENCOUNTER — Telehealth: Payer: Self-pay | Admitting: Internal Medicine

## 2020-07-24 NOTE — Telephone Encounter (Signed)
Lm asking patient to let me know a different location to send his Mesalamine

## 2020-07-24 NOTE — Telephone Encounter (Signed)
Pt states that he received a letter from [facility] stating that they do not have Mesalamine in stock and they do not know when it will be available. Pt does not know what to do. Pls call him.

## 2020-07-25 NOTE — Telephone Encounter (Signed)
Patient calling back stating he has called to different pharmacies and they do not have the medication in stock.  Is requesting alternate medication be sent to [facility] please.

## 2020-07-26 MED ORDER — MESALAMINE 1.2 G PO TBEC: 4.8000 g | DELAYED_RELEASE_TABLET | Freq: Every day | ORAL | 3 refills | Status: DC

## 2020-07-26 NOTE — Telephone Encounter (Signed)
Can we send in an alternative since Delzicol is on back order at so may places?  Please advise

## 2020-07-26 NOTE — Addendum Note (Signed)
Addended by: Audrea Muscat on: 07/26/2020 02:31 PM   Modules accepted: Orders

## 2020-07-26 NOTE — Telephone Encounter (Signed)
Lialda 4.8 g daily (it would be 4 of the 1.2 g tabs).  If this is not acceptable, please provide me with an acceptable alternative.  Thanks

## 2020-07-26 NOTE — Telephone Encounter (Signed)
Lm on patient's voicemail that I would send Lialda to [facility] as an alternative.

## 2020-08-08 ENCOUNTER — Telehealth: Payer: Self-pay | Admitting: Internal Medicine

## 2020-08-08 NOTE — Telephone Encounter (Signed)
Patient called and would like to go over Lialda medication side effects with a nurse.

## 2020-08-08 NOTE — Telephone Encounter (Signed)
Pt states he wants to come in a talk to Dr. Henrene Pastor about the side effects he has with the delzicol his is taking. He has enough left to get through mid April but does not want to start on Lialda if he will have side effects with it as there seem to be more possible SE with the lialda. Pt scheduled to see Dr. Henrene Pastor 08/22/20 at 3:40pm. Pt aware of appt.

## 2020-08-09 ENCOUNTER — Ambulatory Visit: Payer: Medicare Other | Admitting: Internal Medicine

## 2020-08-14 ENCOUNTER — Other Ambulatory Visit: Payer: Self-pay | Admitting: Family Medicine

## 2020-08-22 ENCOUNTER — Encounter: Payer: Self-pay | Admitting: Internal Medicine

## 2020-08-22 ENCOUNTER — Other Ambulatory Visit: Payer: Self-pay

## 2020-08-22 ENCOUNTER — Ambulatory Visit (INDEPENDENT_AMBULATORY_CARE_PROVIDER_SITE_OTHER): Payer: Medicare Other | Admitting: Internal Medicine

## 2020-08-22 VITALS — BP 148/76 | HR 68 | Ht 69.5 in | Wt 272.0 lb

## 2020-08-22 DIAGNOSIS — K5901 Slow transit constipation: Secondary | ICD-10-CM | POA: Diagnosis not present

## 2020-08-22 DIAGNOSIS — K51919 Ulcerative colitis, unspecified with unspecified complications: Secondary | ICD-10-CM

## 2020-08-22 DIAGNOSIS — R194 Change in bowel habit: Secondary | ICD-10-CM

## 2020-08-22 NOTE — Patient Instructions (Signed)
Begin Lialda  Please follow up in one year

## 2020-08-22 NOTE — Progress Notes (Signed)
HISTORY OF PRESENT ILLNESS:  Bill Fox is a 80 y.o. male with left-sided ulcerative colitis.  He was evaluated earlier this year for change in bowel habits and rectal bleeding.  On Delzicol therapy at that time.  Colonoscopy revealed chronic active colitis involving the distal 40 cm of the colon.  He was given a steroid taper.  This helped.  Last seen in the office July 12, 2020.  Due to insurance formulary preference contacted the office requesting an alternative form of mesalamine.  Lialda prescribed.  For starting the medication he had questions regarding side effect profile.  He also wanted to tell me that he is having issues with constipation.  He did take MiraLAX, which works.  REVIEW OF SYSTEMS:  All non-GI ROS negative unless otherwise stated in the HPI except for sleeping problems, ankle edema, watery eyes  Past Medical History:  Diagnosis Date  . Anxiety   . At risk for sleep apnea    STOP-BANG= 5     SENT TO PCP 11-29-2013  . Cataract   . Chronic fatigue   . DJD (degenerative joint disease)    RIGHT KNEE  . History of colon polyps    2011  &  2013 --  ADENOMATOUS  . History of pulmonary embolism   . Hyperlipidemia    pt denies ever having high chol  . Hypertension   . Right knee meniscal tear   . Ulcerative colitis     Past Surgical History:  Procedure Laterality Date  . CATARACT EXTRACTION W/ INTRAOCULAR LENS IMPLANT Left 2014  . COLONOSCOPY W/ POLYPECTOMY  MULTIPLE--  LAST ONE 09-12-2011  . KNEE ARTHROSCOPY WITH DRILLING/MICROFRACTURE Right 12/03/2013   Procedure: RIGHT KNEE ARTHROSCOPY PARTIAL MEDIAL MENISCECTOMY DEBRIDEMENT SYNOVECTOMY;  Surgeon: Johnn Hai, MD;  Location: [place];  Service: Orthopedics;  Laterality: Right;  . NASAL FRACTURE SURGERY  1985  . TRANSTHORACIC ECHOCARDIOGRAM  11-01-2008   MILD LVH/  EF 23%/  GRADE I DIASTOLIC DYSFUNCTION/  MILD LAE    Social History LEOVANNI BJORKMAN  reports that he quit smoking about  27 years ago. His smoking use included cigarettes. He has a 30.00 pack-year smoking history. He has never used smokeless tobacco. He reports current alcohol use of about 1.0 standard drink of alcohol per week. He reports that he does not use drugs.  family history includes Diabetes in his mother and paternal uncle; Lung cancer in his brother; Stomach cancer in his father.  No Known Allergies     PHYSICAL EXAMINATION: Vital signs: BP (!) 148/76   Pulse 68   Ht 5' 9.5" (1.765 m)   Wt 272 lb (123.4 kg)   BMI 39.59 kg/m   Constitutional: generally well-appearing, no acute distress Psychiatric: alert and oriented x3, cooperative Eyes: extraocular movements intact, anicteric, conjunctiva pink Mouth: oral pharynx moist, no lesions Neck: supple no lymphadenopathy Cardiovascular: heart regular rate and rhythm, no murmur Lungs: clear to auscultation bilaterally Abdomen: soft, obese, nontender, nondistended, no obvious ascites, no peritoneal signs, normal bowel sounds, no organomegaly Rectal: Deferred Extremities: no clubbing or cyanosis.  1+ lower extremity edema bilaterally Skin: no lesions on visible extremities Neuro: No focal deficits.  Cranial nerves intact  ASSESSMENT:  1.  Left-sided ulcerative colitis.  In remission clinically.  Recent colonoscopy as described 2.  Mesalamine therapy.  Discussed options and side effects 3.  Constipation.   PLAN:  1.  Discussed Lialda as an alternative form of mesalamine.  Reviewed side effects and risks.  He is interested in proceeding.  Thus, prescribed Lialda 4.8 g daily 2.  MiraLAX as needed for constipation.  Titrate to need 3.  Routine office follow-up 1 year.  Sooner if needed

## 2020-09-11 ENCOUNTER — Ambulatory Visit: Payer: Medicare Other | Admitting: *Deleted

## 2020-09-14 ENCOUNTER — Ambulatory Visit: Payer: Medicare Other

## 2020-10-02 ENCOUNTER — Other Ambulatory Visit: Payer: Self-pay

## 2020-10-02 ENCOUNTER — Telehealth: Payer: Self-pay | Admitting: Internal Medicine

## 2020-10-02 MED ORDER — MESALAMINE ER 0.375 G PO CP24: 1500.0000 mg | ORAL_CAPSULE | Freq: Every day | ORAL | 3 refills | Status: DC

## 2020-10-02 NOTE — Telephone Encounter (Signed)
Inbound call from patient stating medication that he was last prescribed is not helping and he is still having issues.  Please advise.

## 2020-10-02 NOTE — Telephone Encounter (Signed)
Pt states since he started taking the lialda he has been having loose stools, Also has not been on any antibiotics. Pt aware of plan and script sent to pharmacy.

## 2020-10-02 NOTE — Telephone Encounter (Signed)
Bill Fox pt with [facility] calling. He was on Delzicol and had difficulty getting it. Dr. [name] changed him to Lialda 4.8 grams. Pt calling stating this is not working for him. States he is having diarrhea/loose stools about every hour. As DOD please advise.

## 2020-10-02 NOTE — Telephone Encounter (Signed)
Dr. Henrene Pastor saw him in the office about 6 weeks ago and documented that his symptoms were under good control, in remission.  When did things change?  Has he been on antibiotics recently?  I think certainly changing from Lialda to a different mesalamine formulation is reasonable as a start but I wonder about the above questions as well.   Lets try Apriso 0.375 g pills, 4 pills once daily.  Dispense 1 month with 3 refills.  Thanks

## 2020-10-11 NOTE — Progress Notes (Signed)
Subjective:   Bill Fox is a 80 y.o. male who presents for Medicare Annual/Subsequent preventive examination.  Review of Systems     Cardiac Risk Factors include: male gender;advanced age (>27mn, >>81women);hypertension;dyslipidemia;obesity (BMI >30kg/m2)     Objective:    Today's Vitals   10/12/20 1438  BP: (!) 156/80  Pulse: 80  Resp: 16  Temp: 98.3 F (36.8 C)  TempSrc: Temporal  Weight: 260 lb 6.4 oz (118.1 kg)  Height: 6' (1.829 m)  Patient states he checks his b/p at home & it is normal.  Body mass index is 35.32 kg/m.  Advanced Directives 10/12/2020 09/09/2019 09/03/2018 09/01/2017 07/12/2015 05/01/2015 01/27/2015  Does Patient Have a Medical Advance Directive? No No No No No No Yes  Would patient like information on creating a medical advance directive? Yes (MAU/Ambulatory/Procedural Areas - Information given) No - Patient declined Yes (MAU/Ambulatory/Procedural Areas - Information given) Yes (MAU/Ambulatory/Procedural Areas - Information given) No - patient declined information - -    Current Medications (verified) Outpatient Encounter Medications as of 10/12/2020  Medication Sig  . doxazosin (CARDURA) 8 MG tablet TAKE ONE-HALF (1/2) TABLET AT BEDTIME  . mesalamine (APRISO) 0.375 g 24 hr capsule Take 4 capsules (1.5 g total) by mouth daily.  . mirtazapine (REMERON) 30 MG tablet Take 1 tablet (30 mg total) by mouth at bedtime.   No facility-administered encounter medications on file as of 10/12/2020.    Allergies (verified) Patient has no known allergies.   History: Past Medical History:  Diagnosis Date  . Anxiety   . At risk for sleep apnea    STOP-BANG= 5     SENT TO PCP 11-29-2013  . Cataract   . Chronic fatigue   . DJD (degenerative joint disease)    RIGHT KNEE  . History of colon polyps    2011  &  2013 --  ADENOMATOUS  . History of pulmonary embolism   . Hyperlipidemia    pt denies ever having high chol  . Hypertension   . Right knee  meniscal tear   . Ulcerative colitis    Past Surgical History:  Procedure Laterality Date  . CATARACT EXTRACTION W/ INTRAOCULAR LENS IMPLANT Left 2014  . COLONOSCOPY W/ POLYPECTOMY  MULTIPLE--  LAST ONE 09-12-2011  . KNEE ARTHROSCOPY WITH DRILLING/MICROFRACTURE Right 12/03/2013   Procedure: RIGHT KNEE ARTHROSCOPY PARTIAL MEDIAL MENISCECTOMY DEBRIDEMENT SYNOVECTOMY;  Surgeon: JJohnn Hai MD;  Location[place]  Service: Orthopedics;  Laterality: Right;  . NASAL FRACTURE SURGERY  1985  . TRANSTHORACIC ECHOCARDIOGRAM  11-01-2008   MILD LVH/  EF 597%/ GRADE I DIASTOLIC DYSFUNCTION/  MILD LAE   Family History  Problem Relation Age of Onset  . Diabetes Mother   . Stomach cancer Father   . Lung cancer Brother   . Diabetes Paternal Uncle        x 2  . Colon cancer Neg Hx   . Pancreatic cancer Neg Hx   . Esophageal cancer Neg Hx   . Colon polyps Neg Hx    Social History   Socioeconomic History  . Marital status: Married    Spouse name: Not on file  . Number of children: Not on file  . Years of education: Not on file  . Highest education level: Not on file  Occupational History  . Occupation: retired  Tobacco Use  . Smoking status: Former Smoker    Packs/day: 1.00    Years: 30.00    Pack years:  30.00    Types: Cigarettes    Quit date: 05/20/1993    Years since quitting: 27.4  . Smokeless tobacco: Never Used  Vaping Use  . Vaping Use: Never used  Substance and Sexual Activity  . Alcohol use: Yes    Alcohol/week: 1.0 standard drink    Types: 1 Cans of beer per week    Comment: beer on occ  . Drug use: No  . Sexual activity: Yes  Other Topics Concern  . Not on file  Social History Narrative  . Not on file   Social Determinants of Health   Financial Resource Strain: Low Risk   . Difficulty of Paying Living Expenses: Not hard at all  Food Insecurity: No Food Insecurity  . Worried About Charity fundraiser in the Last Year: Never true  . Ran Out  of Food in the Last Year: Never true  Transportation Needs: No Transportation Needs  . Lack of Transportation (Medical): No  . Lack of Transportation (Non-Medical): No  Physical Activity: Sufficiently Active  . Days of Exercise per Week: 7 days  . Minutes of Exercise per Session: 30 min  Stress: No Stress Concern Present  . Feeling of Stress : Not at all  Social Connections: Socially Integrated  . Frequency of Communication with Friends and Family: More than three times a week  . Frequency of Social Gatherings with Friends and Family: More than three times a week  . Attends Religious Services: 1 to 4 times per year  . Active Member of Clubs or Organizations: Yes  . Attends Archivist Meetings: 1 to 4 times per year  . Marital Status: Married    Tobacco Counseling Counseling given: Not Answered   Clinical Intake:  Pre-visit preparation completed: Yes  Pain : No/denies pain     Nutritional Status: BMI > 30  Obese Nutritional Risks: None Diabetes: No  How often do you need to have someone help you when you read instructions, pamphlets, or other written materials from your doctor or pharmacy?: 1 - Never  Diabetic?No  Interpreter Needed?: No  Information entered by :: Caroleen Hamman LPN   Activities of Daily Living In your present state of health, do you have any difficulty performing the following activities: 10/12/2020  Hearing? N  Vision? N  Difficulty concentrating or making decisions? N  Walking or climbing stairs? N  Dressing or bathing? N  Doing errands, shopping? N  Preparing Food and eating ? N  Using the Toilet? N  In the past six months, have you accidently leaked urine? N  Do you have problems with loss of bowel control? N  Managing your Medications? N  Managing your Finances? N  Housekeeping or managing your Housekeeping? N  Some recent data might be hidden    Patient Care Team: [name], DO as PCP - General (Family  Medicine)  Indicate any recent Medical Services you may have received from other than [facility] providers in the past year (date may be approximate).     Assessment:   This is a routine wellness examination for [name].  Hearing/Vision screen  Hearing Screening   125Hz  250Hz  500Hz  1000Hz  2000Hz  3000Hz  4000Hz  6000Hz  8000Hz   Right ear:           Left ear:           Comments: No issues  Vision Screening Comments: Wears glasses Last eye exam-3 years ago  Dietary issues and exercise activities discussed: Current Exercise Habits: Home exercise  routine, Type of exercise: treadmill, Time (Minutes): 30, Frequency (Times/Week): 7, Weekly Exercise (Minutes/Week): 210, Intensity: Mild, Exercise limited by: None identified  Goals Addressed            This Visit's Progress   . Patient Stated       Drink more water      Depression Screen PHQ 2/9 Scores 10/12/2020 09/09/2019 09/03/2018 09/01/2017  PHQ - 2 Score 0 0 0 0    Fall Risk Fall Risk  10/12/2020 09/09/2019 09/03/2018 09/01/2017  Falls in the past year? 0 1 0 No  Number falls in past yr: 0 0 - -  Injury with Fall? 0 1 - -  Follow up Falls prevention discussed Education provided;Falls prevention discussed - -    FALL RISK PREVENTION PERTAINING TO THE HOME:  Any stairs in or around the home? Yes  If so, are there any without handrails? No  Home free of loose throw rugs in walkways, pet beds, electrical cords, etc? Yes  Adequate lighting in your home to reduce risk of falls? Yes   ASSISTIVE DEVICES UTILIZED TO PREVENT FALLS:  Life alert? No  Use of a cane, walker or w/c? No  Grab bars in the bathroom? Yes  Shower chair or bench in shower? No  Elevated toilet seat or a handicapped toilet? No   TIMED UP AND GO:  Was the test performed? Yes .  Length of time to ambulate 10 feet: 11 sec.   Gait slow and steady without use of assistive device  Cognitive Function:Normal cognitive status assessed by direct observation by this Nurse  Health Advisor. No abnormalities found.   MMSE - Mini Mental State Exam 09/01/2017  Orientation to time 5  Orientation to Place 5  Registration 3  Attention/ Calculation 5  Recall 3  Language- name 2 objects 2  Language- repeat 1  Language- follow 3 step command 3  Language- read & follow direction 1  Write a sentence 1  Copy design 1  Total score 30        Immunizations Immunization History  Administered Date(s) Administered  . Influenza Split 02/18/2016  . Influenza Whole 02/16/2009, 01/31/2010  . Influenza, High Dose Seasonal PF 03/11/2018  . Influenza,inj,Quad PF,6+ Mos 03/03/2014, 01/30/2015  . Janssen (J&J) SARS-COV-2 Vaccination 07/29/2019  . Moderna Sars-Covid-2 Vaccination 05/22/2020  . Pneumococcal Conjugate-13 01/30/2015  . Pneumococcal Polysaccharide-23 06/20/2008  . Tdap 09/03/2011    TDAP status: Up to date  Flu vaccine status: Due 01/2021  Pneumococcal vaccine status: Up to date  Covid-19 vaccine status: Completed vaccines  Qualifies for Shingles Vaccine? Yes   Zostavax completed No   Shingrix Completed?: No.    Education has been provided regarding the importance of this vaccine. Patient has been advised to call insurance company to determine out of pocket expense if they have not yet received this vaccine. Advised may also receive vaccine at local pharmacy or Health Dept. Verbalized acceptance and understanding.  Screening Tests Health Maintenance  Topic Date Due  . Hepatitis C Screening  Never done  . Zoster Vaccines- Shingrix (1 of 2) Never done  . COVID-19 Vaccine (3 - Booster for Janssen series) 07/17/2020  . INFLUENZA VACCINE  12/18/2020  . TETANUS/TDAP  09/02/2021  . COLONOSCOPY (Pts 45-62yr Insurance coverage will need to be confirmed)  05/31/2023  . PNA vac Low Risk Adult  Completed  . HPV VACCINES  Aged Out    Health Maintenance  Health Maintenance Due  Topic Date Due  .  Hepatitis C Screening  Never done  . Zoster Vaccines-  Shingrix (1 of 2) Never done  . COVID-19 Vaccine (3 - Booster for Janssen series) 07/17/2020    Colorectal cancer screening: Type of screening: Colonoscopy. Completed 05/30/2020. Repeat every 2 years  Lung Cancer Screening: (Low Dose CT Chest recommended if Age 77-80 years, 30 pack-year currently smoking OR have quit w/in 15years.) does not qualify.     Additional Screening:  Hepatitis C Screening: does not qualify  Vision Screening: Recommended annual ophthalmology exams for early detection of glaucoma and other disorders of the eye. Is the patient up to date with their annual eye exam?  No  Who is the provider or what is the name of the office in which the patient attends annual eye exams? Pt unsure of name Patient plans to make an appt soon  Dental Screening: Recommended annual dental exams for proper oral hygiene  Community Resource Referral / Chronic Care Management: CRR required this visit?  No   CCM required this visit?  No      Plan:     I have personally reviewed and noted the following in the patient's chart:   . Medical and social history . Use of alcohol, tobacco or illicit drugs  . Current medications and supplements including opioid prescriptions. Patient is not currently taking opioid prescriptions. . Functional ability and status . Nutritional status . Physical activity . Advanced directives . List of other physicians . Hospitalizations, surgeries, and ER visits in previous 12 months . Vitals . Screenings to include cognitive, depression, and falls . Referrals and appointments  In addition, I have reviewed and discussed with patient certain preventive protocols, quality metrics, and best practice recommendations. A written personalized care plan for preventive services as well as general preventive health recommendations were provided to patient.     Marta Antu, LPN   5/68/1275  Nurse Health Advisor  Nurse Notes: None

## 2020-10-12 ENCOUNTER — Other Ambulatory Visit: Payer: Self-pay

## 2020-10-12 ENCOUNTER — Ambulatory Visit (INDEPENDENT_AMBULATORY_CARE_PROVIDER_SITE_OTHER): Payer: Medicare Other

## 2020-10-12 VITALS — BP 156/80 | HR 80 | Temp 98.3°F | Resp 16 | Ht 72.0 in | Wt 260.4 lb

## 2020-10-12 DIAGNOSIS — Z0000 Encounter for general adult medical examination without abnormal findings: Secondary | ICD-10-CM

## 2020-10-12 NOTE — Patient Instructions (Signed)
Bill Fox , Thank you for taking time to come for your Medicare Wellness Visit. I appreciate your ongoing commitment to your health goals. Please review the following plan we discussed and let me know if I can assist you in the future.   Screening recommendations/referrals: Colonoscopy: Completed-05/30/2020-Due 05/30/2022 Recommended yearly ophthalmology/optometry visit for glaucoma screening and checkup Recommended yearly dental visit for hygiene and checkup  Vaccinations: Influenza vaccine: Due 01/2021 Pneumococcal vaccine: Completed vaccines Tdap vaccine: Up to date-Due-09/02/2021 Shingles vaccine: Discuss with pharmacy  Covid-19: Up to date  Advanced directives: Information given today  Conditions/risks identified: See problem list  Next appointment: Follow up in one year for your annual wellness visit. 10/18/2021 @ 2:40.  Preventive Care 44 Years and Older, Male Preventive care refers to lifestyle choices and visits with your health care provider that can promote health and wellness. What does preventive care include?  A yearly physical exam. This is also called an annual well check.  Dental exams once or twice a year.  Routine eye exams. Ask your health care provider how often you should have your eyes checked.  Personal lifestyle choices, including:  Daily care of your teeth and gums.  Regular physical activity.  Eating a healthy diet.  Avoiding tobacco and drug use.  Limiting alcohol use.  Practicing safe sex.  Taking low doses of aspirin every day.  Taking vitamin and mineral supplements as recommended by your health care provider. What happens during an annual well check? The services and screenings done by your health care provider during your annual well check will depend on your age, overall health, lifestyle risk factors, and family history of disease. Counseling  Your health care provider may ask you questions about your:  Alcohol use.  Tobacco  use.  Drug use.  Emotional well-being.  Home and relationship well-being.  Sexual activity.  Eating habits.  History of falls.  Memory and ability to understand (cognition).  Work and work Statistician. Screening  You may have the following tests or measurements:  Height, weight, and BMI.  Blood pressure.  Lipid and cholesterol levels. These may be checked every 5 years, or more frequently if you are over 80 years old.  Skin check.  Lung cancer screening. You may have this screening every year starting at age 87 if you have a 30-pack-year history of smoking and currently smoke or have quit within the past 15 years.  Fecal occult blood test (FOBT) of the stool. You may have this test every year starting at age 43.  Flexible sigmoidoscopy or colonoscopy. You may have a sigmoidoscopy every 5 years or a colonoscopy every 10 years starting at age 4.  Prostate cancer screening. Recommendations will vary depending on your family history and other risks.  Hepatitis C blood test.  Hepatitis B blood test.  Sexually transmitted disease (STD) testing.  Diabetes screening. This is done by checking your blood sugar (glucose) after you have not eaten for a while (fasting). You may have this done every 1-3 years.  Abdominal aortic aneurysm (AAA) screening. You may need this if you are a current or former smoker.  Osteoporosis. You may be screened starting at age 29 if you are at high risk. Talk with your health care provider about your test results, treatment options, and if necessary, the need for more tests. Vaccines  Your health care provider may recommend certain vaccines, such as:  Influenza vaccine. This is recommended every year.  Tetanus, diphtheria, and acellular pertussis (Tdap, Td) vaccine. You  may need a Td booster every 10 years.  Zoster vaccine. You may need this after age 33.  Pneumococcal 13-valent conjugate (PCV13) vaccine. One dose is recommended after age  42.  Pneumococcal polysaccharide (PPSV23) vaccine. One dose is recommended after age 59. Talk to your health care provider about which screenings and vaccines you need and how often you need them. This information is not intended to replace advice given to you by your health care provider. Make sure you discuss any questions you have with your health care provider. Document Released: 06/02/2015 Document Revised: 01/24/2016 Document Reviewed: 03/07/2015 Elsevier Interactive Patient Education  2017 [place] Prevention in the Home Falls can cause injuries. They can happen to people of all ages. There are many things you can do to make your home safe and to help prevent falls. What can I do on the outside of my home?  Regularly fix the edges of walkways and driveways and fix any cracks.  Remove anything that might make you trip as you walk through a door, such as a raised step or threshold.  Trim any bushes or trees on the path to your home.  Use bright outdoor lighting.  Clear any walking paths of anything that might make someone trip, such as rocks or tools.  Regularly check to see if handrails are loose or broken. Make sure that both sides of any steps have handrails.  Any raised decks and porches should have guardrails on the edges.  Have any leaves, snow, or ice cleared regularly.  Use sand or salt on walking paths during winter.  Clean up any spills in your garage right away. This includes oil or grease spills. What can I do in the bathroom?  Use night lights.  Install grab bars by the toilet and in the tub and shower. Do not use towel bars as grab bars.  Use non-skid mats or decals in the tub or shower.  If you need to sit down in the shower, use a plastic, non-slip stool.  Keep the floor dry. Clean up any water that spills on the floor as soon as it happens.  Remove soap buildup in the tub or shower regularly.  Attach bath mats securely with double-sided  non-slip rug tape.  Do not have throw rugs and other things on the floor that can make you trip. What can I do in the bedroom?  Use night lights.  Make sure that you have a light by your bed that is easy to reach.  Do not use any sheets or blankets that are too big for your bed. They should not hang down onto the floor.  Have a firm chair that has side arms. You can use this for support while you get dressed.  Do not have throw rugs and other things on the floor that can make you trip. What can I do in the kitchen?  Clean up any spills right away.  Avoid walking on wet floors.  Keep items that you use a lot in easy-to-reach places.  If you need to reach something above you, use a strong step stool that has a grab bar.  Keep electrical cords out of the way.  Do not use floor polish or wax that makes floors slippery. If you must use wax, use non-skid floor wax.  Do not have throw rugs and other things on the floor that can make you trip. What can I do with my stairs?  Do not leave any items  on the stairs.  Make sure that there are handrails on both sides of the stairs and use them. Fix handrails that are broken or loose. Make sure that handrails are as long as the stairways.  Check any carpeting to make sure that it is firmly attached to the stairs. Fix any carpet that is loose or worn.  Avoid having throw rugs at the top or bottom of the stairs. If you do have throw rugs, attach them to the floor with carpet tape.  Make sure that you have a light switch at the top of the stairs and the bottom of the stairs. If you do not have them, ask someone to add them for you. What else can I do to help prevent falls?  Wear shoes that:  Do not have high heels.  Have rubber bottoms.  Are comfortable and fit you well.  Are closed at the toe. Do not wear sandals.  If you use a stepladder:  Make sure that it is fully opened. Do not climb a closed stepladder.  Make sure that both  sides of the stepladder are locked into place.  Ask someone to hold it for you, if possible.  Clearly mark and make sure that you can see:  Any grab bars or handrails.  First and last steps.  Where the edge of each step is.  Use tools that help you move around (mobility aids) if they are needed. These include:  Canes.  Walkers.  Scooters.  Crutches.  Turn on the lights when you go into a dark area. Replace any light bulbs as soon as they burn out.  Set up your furniture so you have a clear path. Avoid moving your furniture around.  If any of your floors are uneven, fix them.  If there are any pets around you, be aware of where they are.  Review your medicines with your doctor. Some medicines can make you feel dizzy. This can increase your chance of falling. Ask your doctor what other things that you can do to help prevent falls. This information is not intended to replace advice given to you by your health care provider. Make sure you discuss any questions you have with your health care provider. Document Released: 03/02/2009 Document Revised: 10/12/2015 Document Reviewed: 06/10/2014 Elsevier Interactive Patient Education  2017 [employer].

## 2020-10-23 NOTE — Telephone Encounter (Signed)
Pt called again as he has not heard back from [place] yet. He is aware that Dr. Henrene Pastor is off but will be back in the office tomorrow afternoon.

## 2020-10-23 NOTE — Telephone Encounter (Signed)
Pt states that Apriso is not working, he is still dealing with diarrhea.

## 2020-10-23 NOTE — Telephone Encounter (Signed)
Bill Fox pt

## 2020-10-23 NOTE — Telephone Encounter (Signed)
Patient reports that he is having 2-3 episodes of urgent diarrhea a day.  Patient reports that he is having liquid stools then the stools become more solid with each BM. Stools are very urgent  He is taking Apriso 0.375 4 daily. That was changed from [place] see the messages below.  Dr. Henrene Pastor please advise next steps

## 2020-10-24 ENCOUNTER — Other Ambulatory Visit: Payer: Self-pay

## 2020-10-24 MED ORDER — PREDNISONE 10 MG PO TABS: 10.0000 mg | ORAL_TABLET | Freq: Every day | ORAL | 0 refills | Status: DC

## 2020-10-24 NOTE — Telephone Encounter (Signed)
Order to pharmacy for Prednisone taper and gave patient instructions for taking. Patient wants to know if he should continue the Apriso ? Please advise

## 2020-10-24 NOTE — Telephone Encounter (Signed)
Please initiate a course of prednisone.  40 mg for 1 week, then 30 mg for 1 week, then 20 mg for 1 week, then 10 mg for 1 week.  Then stop.  Have him give [place] an update next week to see if he is doing better.  Thanks

## 2020-11-15 ENCOUNTER — Telehealth: Payer: Self-pay | Admitting: Internal Medicine

## 2020-11-15 DIAGNOSIS — R194 Change in bowel habit: Secondary | ICD-10-CM

## 2020-11-15 DIAGNOSIS — K51919 Ulcerative colitis, unspecified with unspecified complications: Secondary | ICD-10-CM

## 2020-11-15 NOTE — Telephone Encounter (Signed)
1.  Tell him to increase his prednisone to 40 mg daily. 2.  Have him come in and submit stool for C. difficile.  Also check CBC and comprehensive metabolic panel 3.  He should use antidiarrheals as needed.  I believe he has Lomotil or Imodium.  Thanks 4.  Have him give [place] a follow-up next week after he has been on prednisone 40 mg daily.

## 2020-11-15 NOTE — Telephone Encounter (Signed)
Spoke with patient. Patient started on mesalamine May 2022 for diarrhea episodes. Patient reports that medication is still not managing symptoms.Patient states he has increased gas, that then "blows out fluid" says fluid is brown but he also has formed stools.States he is running to the restroom often with urgency. Patient says he starts last week of prednisone tomorrow, but also feels the prednisone is not working either. Requesting recs, please advise.

## 2020-11-15 NOTE — Telephone Encounter (Signed)
Patient called states the Mesalamine medication is not working seeking an alternative.

## 2020-11-16 ENCOUNTER — Other Ambulatory Visit: Payer: Self-pay | Admitting: *Deleted

## 2020-11-16 MED ORDER — PREDNISONE 10 MG PO TABS: 40.0000 mg | ORAL_TABLET | Freq: Every day | ORAL | 1 refills | Status: DC

## 2020-11-16 NOTE — Telephone Encounter (Signed)
Spoke with patient, patient scheduled appt for for Aug 9 @ 10:40am. Rx sent and pt aware of labs. Nothing further needed at this time.

## 2020-11-16 NOTE — Telephone Encounter (Signed)
Spoke with patient, patient made aware of recs per MD. Patient advised labs and stool orders have been placed in Epic. Pt states he only has 7 pills of prednisone left, Rx sent to pharmacy for rec increase. Pt verbalized understanding and nothing further at this time.  Next week follow up OV requested, next available is Aug 9. Is this ok? Please advise.

## 2020-11-17 ENCOUNTER — Other Ambulatory Visit (INDEPENDENT_AMBULATORY_CARE_PROVIDER_SITE_OTHER): Payer: Medicare Other

## 2020-11-17 DIAGNOSIS — K51919 Ulcerative colitis, unspecified with unspecified complications: Secondary | ICD-10-CM | POA: Diagnosis not present

## 2020-11-17 DIAGNOSIS — R194 Change in bowel habit: Secondary | ICD-10-CM | POA: Diagnosis not present

## 2020-11-17 LAB — CBC WITH DIFFERENTIAL/PLATELET
Basophils Absolute: 0 10*3/uL (ref 0.0–0.1)
Basophils Relative: 0.5 % (ref 0.0–3.0)
Eosinophils Absolute: 0.1 10*3/uL (ref 0.0–0.7)
Eosinophils Relative: 0.8 % (ref 0.0–5.0)
HCT: 46.7 % (ref 39.0–52.0)
Hemoglobin: 15.5 g/dL (ref 13.0–17.0)
Lymphocytes Relative: 29.6 % (ref 12.0–46.0)
Lymphs Abs: 1.9 10*3/uL (ref 0.7–4.0)
MCHC: 33.2 g/dL (ref 30.0–36.0)
MCV: 86 fl (ref 78.0–100.0)
Monocytes Absolute: 1 10*3/uL (ref 0.1–1.0)
Monocytes Relative: 14.7 % — ABNORMAL HIGH (ref 3.0–12.0)
Neutro Abs: 3.6 10*3/uL (ref 1.4–7.7)
Neutrophils Relative %: 54.4 % (ref 43.0–77.0)
Platelets: 163 10*3/uL (ref 150.0–400.0)
RBC: 5.43 Mil/uL (ref 4.22–5.81)
RDW: 13.7 % (ref 11.5–15.5)
WBC: 6.5 10*3/uL (ref 4.0–10.5)

## 2020-11-17 LAB — COMPREHENSIVE METABOLIC PANEL
ALT: 17 U/L (ref 0–53)
AST: 12 U/L (ref 0–37)
Albumin: 3.9 g/dL (ref 3.5–5.2)
Alkaline Phosphatase: 53 U/L (ref 39–117)
BUN: 16 mg/dL (ref 6–23)
CO2: 23 mEq/L (ref 19–32)
Calcium: 8.9 mg/dL (ref 8.4–10.5)
Chloride: 106 mEq/L (ref 96–112)
Creatinine, Ser: 1.07 mg/dL (ref 0.40–1.50)
GFR: 66.86 mL/min (ref >60.00)
Glucose, Bld: 90 mg/dL (ref 70–99)
Potassium: 4 mEq/L (ref 3.5–5.1)
Sodium: 139 mEq/L (ref 135–145)
Total Bilirubin: 0.3 mg/dL (ref 0.2–1.2)
Total Protein: 7.6 g/dL (ref 6.0–8.3)

## 2020-11-20 ENCOUNTER — Emergency Department (HOSPITAL_COMMUNITY): Payer: Medicare Other

## 2020-11-20 ENCOUNTER — Inpatient Hospital Stay (HOSPITAL_COMMUNITY)
Admission: EM | Admit: 2020-11-20 | Discharge: 2020-11-25 | DRG: 175 | Disposition: A | Discharge status: Home / Self Care | Payer: Medicare Other | Attending: Internal Medicine | Admitting: Internal Medicine

## 2020-11-20 ENCOUNTER — Encounter (HOSPITAL_COMMUNITY): Payer: Self-pay

## 2020-11-20 DIAGNOSIS — E875 Hyperkalemia: Secondary | ICD-10-CM | POA: Diagnosis present

## 2020-11-20 DIAGNOSIS — Z86711 Personal history of pulmonary embolism: Secondary | ICD-10-CM

## 2020-11-20 DIAGNOSIS — J9601 Acute respiratory failure with hypoxia: Secondary | ICD-10-CM | POA: Diagnosis present

## 2020-11-20 DIAGNOSIS — R0689 Other abnormalities of breathing: Secondary | ICD-10-CM | POA: Diagnosis not present

## 2020-11-20 DIAGNOSIS — R0902 Hypoxemia: Secondary | ICD-10-CM | POA: Diagnosis not present

## 2020-11-20 DIAGNOSIS — I2602 Saddle embolus of pulmonary artery with acute cor pulmonale: Secondary | ICD-10-CM | POA: Diagnosis present

## 2020-11-20 DIAGNOSIS — Z6834 Body mass index (BMI) 34.0-34.9, adult: Secondary | ICD-10-CM

## 2020-11-20 DIAGNOSIS — Z7952 Long term (current) use of systemic steroids: Secondary | ICD-10-CM

## 2020-11-20 DIAGNOSIS — D6959 Other secondary thrombocytopenia: Secondary | ICD-10-CM | POA: Diagnosis present

## 2020-11-20 DIAGNOSIS — I10 Essential (primary) hypertension: Secondary | ICD-10-CM | POA: Diagnosis not present

## 2020-11-20 DIAGNOSIS — R410 Disorientation, unspecified: Secondary | ICD-10-CM | POA: Diagnosis not present

## 2020-11-20 DIAGNOSIS — I82409 Acute embolism and thrombosis of unspecified deep veins of unspecified lower extremity: Secondary | ICD-10-CM | POA: Diagnosis present

## 2020-11-20 DIAGNOSIS — Z801 Family history of malignant neoplasm of trachea, bronchus and lung: Secondary | ICD-10-CM

## 2020-11-20 DIAGNOSIS — I2692 Saddle embolus of pulmonary artery without acute cor pulmonale: Secondary | ICD-10-CM

## 2020-11-20 DIAGNOSIS — R069 Unspecified abnormalities of breathing: Secondary | ICD-10-CM | POA: Diagnosis not present

## 2020-11-20 DIAGNOSIS — E119 Type 2 diabetes mellitus without complications: Secondary | ICD-10-CM | POA: Diagnosis present

## 2020-11-20 DIAGNOSIS — Z87891 Personal history of nicotine dependence: Secondary | ICD-10-CM

## 2020-11-20 DIAGNOSIS — Z79899 Other long term (current) drug therapy: Secondary | ICD-10-CM

## 2020-11-20 DIAGNOSIS — E785 Hyperlipidemia, unspecified: Secondary | ICD-10-CM | POA: Diagnosis present

## 2020-11-20 DIAGNOSIS — Z800 Family history of malignant neoplasm of digestive organs: Secondary | ICD-10-CM

## 2020-11-20 DIAGNOSIS — D696 Thrombocytopenia, unspecified: Secondary | ICD-10-CM | POA: Diagnosis not present

## 2020-11-20 DIAGNOSIS — I214 Non-ST elevation (NSTEMI) myocardial infarction: Secondary | ICD-10-CM

## 2020-11-20 DIAGNOSIS — R5382 Chronic fatigue, unspecified: Secondary | ICD-10-CM | POA: Diagnosis present

## 2020-11-20 DIAGNOSIS — K51911 Ulcerative colitis, unspecified with rectal bleeding: Secondary | ICD-10-CM | POA: Diagnosis present

## 2020-11-20 DIAGNOSIS — R0602 Shortness of breath: Secondary | ICD-10-CM | POA: Diagnosis not present

## 2020-11-20 DIAGNOSIS — I82412 Acute embolism and thrombosis of left femoral vein: Secondary | ICD-10-CM | POA: Diagnosis present

## 2020-11-20 DIAGNOSIS — Z833 Family history of diabetes mellitus: Secondary | ICD-10-CM

## 2020-11-20 DIAGNOSIS — J9811 Atelectasis: Secondary | ICD-10-CM | POA: Diagnosis not present

## 2020-11-20 DIAGNOSIS — Z20822 Contact with and (suspected) exposure to covid-19: Secondary | ICD-10-CM | POA: Diagnosis present

## 2020-11-20 DIAGNOSIS — J8410 Pulmonary fibrosis, unspecified: Secondary | ICD-10-CM | POA: Diagnosis not present

## 2020-11-20 DIAGNOSIS — I2699 Other pulmonary embolism without acute cor pulmonale: Secondary | ICD-10-CM | POA: Diagnosis not present

## 2020-11-20 DIAGNOSIS — I2609 Other pulmonary embolism with acute cor pulmonale: Secondary | ICD-10-CM

## 2020-11-20 DIAGNOSIS — K51919 Ulcerative colitis, unspecified with unspecified complications: Secondary | ICD-10-CM | POA: Diagnosis not present

## 2020-11-20 DIAGNOSIS — K5190 Ulcerative colitis, unspecified, without complications: Secondary | ICD-10-CM

## 2020-11-20 DIAGNOSIS — J80 Acute respiratory distress syndrome: Secondary | ICD-10-CM | POA: Diagnosis not present

## 2020-11-20 DIAGNOSIS — R000 Tachycardia, unspecified: Secondary | ICD-10-CM | POA: Diagnosis not present

## 2020-11-20 LAB — CBC WITH DIFFERENTIAL/PLATELET
Abs Immature Granulocytes: 0.08 10*3/uL — ABNORMAL HIGH (ref 0.00–0.07)
Basophils Absolute: 0.1 10*3/uL (ref 0.0–0.1)
Basophils Relative: 1 %
Eosinophils Absolute: 0 10*3/uL (ref 0.0–0.5)
Eosinophils Relative: 0 %
HCT: 45.3 % (ref 39.0–52.0)
Hemoglobin: 14.9 g/dL (ref 13.0–17.0)
Immature Granulocytes: 1 %
Lymphocytes Relative: 5 %
Lymphs Abs: 0.5 10*3/uL — ABNORMAL LOW (ref 0.7–4.0)
MCH: 28.8 pg (ref 26.0–34.0)
MCHC: 32.9 g/dL (ref 30.0–36.0)
MCV: 87.5 fL (ref 80.0–100.0)
Monocytes Absolute: 0.6 10*3/uL (ref 0.1–1.0)
Monocytes Relative: 6 %
Neutro Abs: 8.5 10*3/uL — ABNORMAL HIGH (ref 1.7–7.7)
Neutrophils Relative %: 87 %
Platelets: 118 10*3/uL — ABNORMAL LOW (ref 150–400)
RBC: 5.18 MIL/uL (ref 4.22–5.81)
RDW: 14.4 % (ref 11.5–15.5)
WBC: 9.6 10*3/uL (ref 4.0–10.5)
nRBC: 0 % (ref 0.0–0.2)

## 2020-11-20 LAB — TROPONIN I (HIGH SENSITIVITY)
Troponin I (High Sensitivity): 205 ng/L (ref <18)
Troponin I (High Sensitivity): 284 ng/L (ref <18)

## 2020-11-20 LAB — COMPREHENSIVE METABOLIC PANEL
ALT: 32 U/L (ref 0–44)
AST: 25 U/L (ref 15–41)
Albumin: 3.2 g/dL — ABNORMAL LOW (ref 3.5–5.0)
Alkaline Phosphatase: 45 U/L (ref 38–126)
Anion gap: 12 (ref 5–15)
BUN: 19 mg/dL (ref 8–23)
CO2: 22 mmol/L (ref 22–32)
Calcium: 8.2 mg/dL — ABNORMAL LOW (ref 8.9–10.3)
Chloride: 101 mmol/L (ref 98–111)
Creatinine, Ser: 1.05 mg/dL (ref 0.61–1.24)
GFR, Estimated: >60 mL/min (ref >60)
Glucose, Bld: 134 mg/dL — ABNORMAL HIGH (ref 70–99)
Potassium: 4 mmol/L (ref 3.5–5.1)
Sodium: 135 mmol/L (ref 135–145)
Total Bilirubin: 0.8 mg/dL (ref 0.3–1.2)
Total Protein: 7.1 g/dL (ref 6.5–8.1)

## 2020-11-20 LAB — BRAIN NATRIURETIC PEPTIDE: B Natriuretic Peptide: 42.9 pg/mL (ref 0.0–100.0)

## 2020-11-20 LAB — RESP PANEL BY RT-PCR (FLU A&B, COVID) ARPGX2
Influenza A by PCR: NEGATIVE
Influenza B by PCR: NEGATIVE
SARS Coronavirus 2 by RT PCR: NEGATIVE

## 2020-11-20 LAB — D-DIMER, QUANTITATIVE: D-Dimer, Quant: >20 ug/mL-FEU — ABNORMAL HIGH (ref 0.00–0.50)

## 2020-11-20 LAB — APTT: aPTT: 30 seconds (ref 24–36)

## 2020-11-20 LAB — PROTIME-INR
INR: 1.2 (ref 0.8–1.2)
Prothrombin Time: 14.9 seconds (ref 11.4–15.2)

## 2020-11-20 MED ORDER — IOHEXOL 350 MG/ML SOLN
80.0000 mL | Freq: Once | INTRAVENOUS | Status: AC | PRN
  Administered 2020-11-20: 80 mL via INTRAVENOUS

## 2020-11-20 MED ORDER — HEPARIN BOLUS VIA INFUSION
3000.0000 [IU] | Freq: Once | INTRAVENOUS | Status: AC
  Administered 2020-11-21: 3000 [IU] via INTRAVENOUS
  Filled 2020-11-20: qty 3000

## 2020-11-20 MED ORDER — SODIUM CHLORIDE (PF) 0.9 % IJ SOLN
INTRAMUSCULAR | Status: AC
  Filled 2020-11-20: qty 50

## 2020-11-20 MED ORDER — HEPARIN (PORCINE) 25000 UT/250ML-% IV SOLN
1750.0000 [IU]/h | INTRAVENOUS | Status: DC
  Administered 2020-11-21 (×2): 1650 [IU]/h via INTRAVENOUS
  Filled 2020-11-20 (×3): qty 250

## 2020-11-20 NOTE — ED Provider Notes (Signed)
[facility] DEPT Provider Note   CSN: [id number] Arrival date & time: 11/20/20  1338     History Chief Complaint  Patient presents with   Shortness of Breath    Bill Fox is a 80 y.o. male.  Patient is a 80 year old male with a history of hypertension, hyperlipidemia, prior PE not on anticoagulation who is presenting today with complaint of shortness of breath and left shoulder pain.  Patient was fine when he woke up this morning but after he had been awake for a while he started noticing that he was having pain in his left shoulder blade and started having shortness of breath.  This gradually progressed over the morning and because the shortness of breath was not getting any better he called 911.  He has had a little bit of a cough now that he is arrived in the ER but did not have any cough or fever prior to this.  No abdominal pain, nausea or vomiting.  When EMS arrived patient's initial heart rate was 153 and O2 sats was 75% on room air.  He was placed on a nonrebreather given DuoNeb, 1 albuterol neb and 125 of Solu-Medrol.  Patient reports he does feel much better now.  He does not use oxygen at home.  No known allergens.  No recent medication changes.  He feels like the pain in his shoulder blade is pretty much gone at this time.  The history is provided by the patient.  Shortness of Breath     Past Medical History:  Diagnosis Date   Anxiety    At risk for sleep apnea    STOP-BANG= 5     SENT TO PCP 11-29-2013   Cataract    Chronic fatigue    DJD (degenerative joint disease)    RIGHT KNEE   History of colon polyps    2011  &  2013 --  ADENOMATOUS   History of pulmonary embolism    Hyperlipidemia    pt denies ever having high chol   Hypertension    Right knee meniscal tear    Ulcerative colitis     Patient Active Problem List   Diagnosis Date Noted   Herpes zoster without complication 18/56/3149   Urinary frequency 04/27/2018    Dyslipidemia 03/11/2018   Light headed 01/06/2017   Dizziness 07/12/2015   Left sided colitis ([place])    Hypothyroidism 05/01/2015   Solitary pulmonary nodule 02/01/2015   Cerumen impaction 03/03/2014   Leg swelling 06/10/2013   Knee pain, right 06/10/2013   Scotoma 09/10/2012   Seborrheic keratosis 09/03/2011   Mechanical back pain 11/07/2010   MICROSCOPIC HEMATURIA 08/01/2009   ANAL FISSURE 07/17/2009   Insomnia 06/27/2009   Cough 05/26/2009   HEADACHE 02/16/2009   FATIQUE AND MALAISE 10/21/2008   PULMONARY EMBOLISM AND INFARCTION 07/20/2008   Other iron deficiency anemias 07/02/2007   RECTAL BLEEDING 07/02/2007   Hyperlipidemia LDL goal <130 07/01/2007   Morbid obesity ([place]) 07/01/2007   Essential hypertension 07/01/2007   BENIGN POSITIONAL VERTIGO, HX OF 07/01/2007   COLONIC POLYPS, HX OF 07/01/2007    Past Surgical History:  Procedure Laterality Date   CATARACT EXTRACTION W/ INTRAOCULAR LENS IMPLANT Left 2014   COLONOSCOPY W/ POLYPECTOMY  MULTIPLE--  LAST ONE 09-12-2011   KNEE ARTHROSCOPY WITH DRILLING/MICROFRACTURE Right 12/03/2013   Procedure: RIGHT KNEE ARTHROSCOPY PARTIAL MEDIAL MENISCECTOMY DEBRIDEMENT SYNOVECTOMY;  Surgeon: Johnn Hai, MD;  Location: [facility];  Service: Orthopedics;  Laterality:  Right;   NASAL FRACTURE SURGERY  1985   TRANSTHORACIC ECHOCARDIOGRAM  11-01-2008   MILD LVH/  EF 96%/  GRADE I DIASTOLIC DYSFUNCTION/  MILD LAE       Family History  Problem Relation Age of Onset   Diabetes Mother    Stomach cancer Father    Lung cancer Brother    Diabetes Paternal Uncle        x 2   Colon cancer Neg Hx    Pancreatic cancer Neg Hx    Esophageal cancer Neg Hx    Colon polyps Neg Hx     Social History   Tobacco Use   Smoking status: Former    Packs/day: 1.00    Years: 30.00    Pack years: 30.00    Types: Cigarettes    Quit date: 05/20/1993    Years since quitting: 27.5   Smokeless tobacco: Never  Vaping Use   Vaping  Use: Never used  Substance Use Topics   Alcohol use: Yes    Alcohol/week: 1.0 standard drink    Types: 1 Cans of beer per week    Comment: beer on occ   Drug use: No    Home Medications Prior to Admission medications   Medication Sig Start Date End Date Taking? Authorizing Provider  doxazosin (CARDURA) 8 MG tablet TAKE ONE-HALF (1/2) TABLET AT BEDTIME 08/14/20   Wendling, Crosby Oyster, DO  mesalamine (APRISO) 0.375 g 24 hr capsule Take 4 capsules (1.5 g total) by mouth daily. 10/02/20   Milus Banister, MD  mirtazapine (REMERON) 30 MG tablet Take 1 tablet (30 mg total) by mouth at bedtime. 05/08/20   [name], DO  predniSONE (DELTASONE) 10 MG tablet Take 4 tablets (40 mg total) by mouth daily with breakfast. Take 25m daily for 1 week, then 355mdaily for 1 week, then 2053maily for 1 week, then 49m107mily for 1 week, then Stop 11/16/20   PerrIrene Shipper    Allergies    Patient has no known allergies.  Review of Systems   Review of Systems  Respiratory:  Positive for shortness of breath.   All other systems reviewed and are negative.  Physical Exam Updated Vital Signs BP 113/72 (BP Location: Right Arm)   Pulse 100   Temp 98.9 F (37.2 C) (Oral)   Resp (!) 24   Ht 6' (1.829 m)   Wt 114.3 kg   SpO2 97%   BMI 34.18 kg/m   Physical Exam Vitals and nursing note reviewed.  Constitutional:      General: He is not in acute distress.    Appearance: Normal appearance. He is well-developed.  HENT:     Head: Normocephalic and atraumatic.  Eyes:     Conjunctiva/sclera: Conjunctivae normal.     Pupils: Pupils are equal, round, and reactive to light.  Cardiovascular:     Rate and Rhythm: Regular rhythm. Tachycardia present.     Heart sounds: No murmur heard. Pulmonary:     Effort: Pulmonary effort is normal. No tachypnea, accessory muscle usage or respiratory distress.     Breath sounds: Normal breath sounds. No wheezing or rales.     Comments: Clear breath  sounds in all lobes Abdominal:     General: There is no distension.     Palpations: Abdomen is soft.     Tenderness: There is no abdominal tenderness. There is no guarding or rebound.  Musculoskeletal:        General: No  tenderness. Normal range of motion.     Cervical back: Normal range of motion and neck supple.     Right lower leg: No tenderness. No edema.     Left lower leg: No tenderness. No edema.  Skin:    General: Skin is warm and dry.     Capillary Refill: Capillary refill takes less than 2 seconds.     Findings: No erythema or rash.  Neurological:     General: No focal deficit present.     Mental Status: He is alert and oriented to person, place, and time. Mental status is at baseline.  Psychiatric:        Mood and Affect: Mood normal.        Behavior: Behavior normal.    ED Results / Procedures / Treatments   Labs (all labs ordered are listed, but only abnormal results are displayed) Labs Reviewed  CBC WITH DIFFERENTIAL/PLATELET  COMPREHENSIVE METABOLIC PANEL  BRAIN NATRIURETIC PEPTIDE  D-DIMER, QUANTITATIVE  TROPONIN I (HIGH SENSITIVITY)  TROPONIN I (HIGH SENSITIVITY)    EKG None  Radiology DG Chest Port 1 View  Result Date: 11/20/2020 CLINICAL DATA:  Shoulder blade pain. EXAM: PORTABLE CHEST 1 VIEW COMPARISON:  01/02/2017 FINDINGS: Single view of the chest demonstrates mild haziness in the left lower chest which could be related to atelectasis. Otherwise, the lungs are clear. Slightly low lung volumes. Heart size is within normal limits. Trachea is midline. Negative for a pneumothorax. Degenerative changes at both [facility] joints. IMPRESSION: Low lung volumes with probable atelectasis in the left lower chest. Electronically Signed   By: Markus Daft M.D.   On: 11/20/2020 15:36    Procedures Procedures   Medications Ordered in ED Medications - No data to display  ED Course  I have reviewed the triage vital signs and the nursing notes.  Pertinent labs & imaging  results that were available during my care of the patient were reviewed by me and considered in my medical decision making (see chart for details).    MDM Rules/Calculators/A&P                          Elderly male presenting today with fairly abrupt shortness of breath and left shoulder blade pain.  Arrival patient sats were 75% on room air and heart rate was 150.  He was given Solu-Medrol, albuterol, DuoNeb and placed on nonrebreather.  Upon arrival here patient reports he is feeling much better.  He does not use inhalers regularly had not had recent cough or congestion.  Denies any infectious symptoms.  Patient does have a prior history of PE when he was hospitalized but takes no anticoagulation at this time.  He denies any recent travel or immobilization.  No prior history of CHF or cardiac issues.  No evidence of fluid overload on exam.  Patient reports that the pain in his shoulder blade is pretty much resolved.  EKG with some borderline T wave abnormalities but may be related to rate is upon arrival his heart rate was in the 120s.  Lung sounds are clear at this time.  Concern for possible PE, reactive airways, ACS.  Lower suspicion for dissection, intra-abdominal pathology.  We will continue to follow the patient.  Does not appear to need acute medication at this time.  Over time while the albuterol has worn off heart rate has improved and now is in low 100s.  Oxygen saturation of 97% on 2 L.  Final  Clinical Impression(s) / ED Diagnoses Final diagnoses:  None    Rx / DC Orders ED Discharge Orders     None        Blanchie Dessert, MD 11/22/20 2213

## 2020-11-20 NOTE — Progress Notes (Signed)
ANTICOAGULATION CONSULT NOTE - Initial Consult  Pharmacy Consult for Heparin Indication:  saddle pulmonary embolus  No Known Allergies  Patient Measurements: Height: 6' (182.9 cm) Weight: 114.3 kg (252 lb) IBW/kg (Calculated) : 77.6 Heparin Dosing Weight: 102 kg  Vital Signs: Temp: 98.9 F (37.2 C) (07/04 1403) Temp Source: Oral (07/04 1403) BP: 120/84 (07/04 2300) Pulse Rate: 77 (07/04 2300)  Labs: Recent Labs    11/20/20 1608 11/20/20 1616  HGB 14.9  --   HCT 45.3  --   PLT 118*  --   CREATININE 1.05  --   TROPONINIHS  --  205*    Estimated Creatinine Clearance: 76.9 mL/min (by C-G formula based on SCr of 1.05 mg/dL).   Medical History: Past Medical History:  Diagnosis Date   Anxiety    At risk for sleep apnea    STOP-BANG= 5     SENT TO PCP 11-29-2013   Cataract    Chronic fatigue    DJD (degenerative joint disease)    RIGHT KNEE   History of colon polyps    2011  &  2013 --  ADENOMATOUS   History of pulmonary embolism    Hyperlipidemia    pt denies ever having high chol   Hypertension    Right knee meniscal tear    Ulcerative colitis     Medications:  No oral anticoagulation PTA  Assessment: 80 yr male with shortness of breath and left shoulder pain PMH significant for HTN, HLD, PE (not currently on anticoagulation) CTAngio = + saddle PE  Goal of Therapy:  Heparin level 0.3-0.7 units/ml Monitor platelets by anticoagulation protocol: Yes   Plan:  Obtain baseline aPTT and PT/INR Heparin 3000 unit IV bolus x 1 followed by heparin gtt @ 1650 units/hr Check heparin level 8 hr after heparin gtt started Daily heparin level and CBC Monitor for signs and symptoms of bleeding  Everette Rank, PharmD 11/20/2020,11:16 PM

## 2020-11-20 NOTE — ED Notes (Signed)
Attempted to draw labs from pt's 20g piv, IV flushes easily, but despite efforts, will not pull back blood.  Attempted to straight stick for labs, unsuccessful attempt x1.

## 2020-11-20 NOTE — ED Triage Notes (Signed)
Pt BIB by [facility]. Called out for [facility]. Initial HR 153, SpO2 75%, pale and diaphoretic, decreased sounds in all fields. No lung hx. ST, no chest pain, denies other pain. 20ga RW. 1 albuterol neb, duoneb, 125 mg solumedrol. Pt on NRB en route. EtCO2 17-27. Arrives on 3L [place]. A&Ox4.   BP 152/98 CBG 149 HR 120s SpO2 98% NRB, 98% 3L [place] EtCO2

## 2020-11-20 NOTE — ED Provider Notes (Signed)
Signout from Dr. Maryan Rued.  80 year old male with acute onset of shortness of breath and left shoulder pain today.  He has received Solu-Medrol and nebulizer treatments.  He is currently pending labs chest x-ray and likely will need advanced imaging.  Disposition per results of testing. Physical Exam  BP 113/72 (BP Location: Right Arm)   Pulse 100   Temp 98.9 F (37.2 C) (Oral)   Resp (!) 24   Ht 6' (1.829 m)   Wt 114.3 kg   SpO2 97%   BMI 34.18 kg/m   Physical Exam  ED Course/Procedures     .Critical Care  Date/Time: 11/21/2020 11:02 AM Performed by: Hayden Rasmussen, MD Authorized by: Hayden Rasmussen, MD   Critical care provider statement:    Critical care time (minutes):  30   Critical care time was exclusive of:  Separately billable procedures and treating other patients   Critical care was necessary to treat or prevent imminent or life-threatening deterioration of the following conditions:  Respiratory failure   Critical care was time spent personally by me on the following activities:  Discussions with consultants, evaluation of patient's response to treatment, examination of patient, ordering and performing treatments and interventions, pulse oximetry, re-evaluation of patient's condition, obtaining history from patient or surrogate, review of old charts and development of treatment plan with patient or surrogate  MDM  Received a call from radiology that patient has a saddle PE.  Informed patient.  He is awake alert satting well on 3 L nasal cannula.  He is in no particular distress.  No tachycardia.  Does have some moderate work of breathing.  Appears comfortable though.  Heparin ordered per pharmacy protocol  Consulted Dr. Valeta Harms critical care who felt that heparin was appropriate at this time.  If he has increasing oxygen requirements or hemodynamic instability consider tPA.  Discussed with [facility] hospitalist Dr.Chotiner who will evaluate the patient for  admission.       Hayden Rasmussen, MD 11/21/20 1104

## 2020-11-21 ENCOUNTER — Inpatient Hospital Stay (HOSPITAL_COMMUNITY): Payer: Medicare Other

## 2020-11-21 ENCOUNTER — Encounter (HOSPITAL_COMMUNITY): Payer: Self-pay | Admitting: Family Medicine

## 2020-11-21 ENCOUNTER — Other Ambulatory Visit: Payer: Self-pay

## 2020-11-21 DIAGNOSIS — I2692 Saddle embolus of pulmonary artery without acute cor pulmonale: Secondary | ICD-10-CM

## 2020-11-21 DIAGNOSIS — I214 Non-ST elevation (NSTEMI) myocardial infarction: Secondary | ICD-10-CM

## 2020-11-21 DIAGNOSIS — I2699 Other pulmonary embolism without acute cor pulmonale: Secondary | ICD-10-CM

## 2020-11-21 DIAGNOSIS — I2609 Other pulmonary embolism with acute cor pulmonale: Secondary | ICD-10-CM

## 2020-11-21 DIAGNOSIS — D696 Thrombocytopenia, unspecified: Secondary | ICD-10-CM | POA: Diagnosis present

## 2020-11-21 DIAGNOSIS — K5190 Ulcerative colitis, unspecified, without complications: Secondary | ICD-10-CM

## 2020-11-21 LAB — CBC
HCT: 45.8 % (ref 39.0–52.0)
HCT: 47.1 % (ref 39.0–52.0)
Hemoglobin: 14.8 g/dL (ref 13.0–17.0)
Hemoglobin: 15.3 g/dL (ref 13.0–17.0)
MCH: 28.4 pg (ref 26.0–34.0)
MCH: 28.2 pg (ref 26.0–34.0)
MCHC: 32.3 g/dL (ref 30.0–36.0)
MCHC: 32.5 g/dL (ref 30.0–36.0)
MCV: 87.7 fL (ref 80.0–100.0)
MCV: 86.9 fL (ref 80.0–100.0)
Platelets: 124 10*3/uL — ABNORMAL LOW (ref 150–400)
Platelets: UNDETERMINED 10*3/uL (ref 150–400)
RBC: 5.22 MIL/uL (ref 4.22–5.81)
RBC: 5.42 MIL/uL (ref 4.22–5.81)
RDW: 14.2 % (ref 11.5–15.5)
RDW: 14.3 % (ref 11.5–15.5)
WBC: 11.5 10*3/uL — ABNORMAL HIGH (ref 4.0–10.5)
WBC: 11.3 10*3/uL — ABNORMAL HIGH (ref 4.0–10.5)
nRBC: 0 % (ref 0.0–0.2)
nRBC: 0 % (ref 0.0–0.2)

## 2020-11-21 LAB — BASIC METABOLIC PANEL
Anion gap: 11 (ref 5–15)
BUN: 19 mg/dL (ref 8–23)
CO2: 22 mmol/L (ref 22–32)
Calcium: 8.7 mg/dL — ABNORMAL LOW (ref 8.9–10.3)
Chloride: 102 mmol/L (ref 98–111)
Creatinine, Ser: 1 mg/dL (ref 0.61–1.24)
GFR, Estimated: >60 mL/min (ref >60)
Glucose, Bld: 127 mg/dL — ABNORMAL HIGH (ref 70–99)
Potassium: 4.1 mmol/L (ref 3.5–5.1)
Sodium: 135 mmol/L (ref 135–145)

## 2020-11-21 LAB — HEPARIN LEVEL (UNFRACTIONATED)
Heparin Unfractionated: 0.36 IU/mL (ref 0.30–0.70)
Heparin Unfractionated: 0.32 IU/mL (ref 0.30–0.70)

## 2020-11-21 LAB — MRSA NEXT GEN BY PCR, NASAL: MRSA by PCR Next Gen: NOT DETECTED

## 2020-11-21 LAB — GLUCOSE, CAPILLARY
Glucose-Capillary: 124 mg/dL — ABNORMAL HIGH (ref 70–99)
Glucose-Capillary: 107 mg/dL — ABNORMAL HIGH (ref 70–99)
Glucose-Capillary: 116 mg/dL — ABNORMAL HIGH (ref 70–99)
Glucose-Capillary: 127 mg/dL — ABNORMAL HIGH (ref 70–99)

## 2020-11-21 LAB — ECHOCARDIOGRAM COMPLETE
Area-P 1/2: 3 cm2
Height: 72 in
S' Lateral: 3.1 cm
Weight: 4038.83 oz

## 2020-11-21 LAB — TROPONIN I (HIGH SENSITIVITY)
Troponin I (High Sensitivity): 211 ng/L (ref <18)
Troponin I (High Sensitivity): 198 ng/L (ref <18)
Troponin I (High Sensitivity): 129 ng/L (ref <18)
Troponin I (High Sensitivity): 136 ng/L (ref <18)

## 2020-11-21 MED ORDER — MIRTAZAPINE 15 MG PO TABS
30.0000 mg | ORAL_TABLET | Freq: Every day | ORAL | Status: DC
  Administered 2020-11-21 – 2020-11-24 (×4): 30 mg via ORAL
  Filled 2020-11-21 (×4): qty 2

## 2020-11-21 MED ORDER — ALBUTEROL SULFATE (2.5 MG/3ML) 0.083% IN NEBU: 2.5000 mg | INHALATION_SOLUTION | RESPIRATORY_TRACT | Status: DC | PRN

## 2020-11-21 MED ORDER — ACETAMINOPHEN 650 MG RE SUPP: 650.0000 mg | Freq: Four times a day (QID) | RECTAL | Status: DC | PRN

## 2020-11-21 MED ORDER — ACETAMINOPHEN 325 MG PO TABS: 650.0000 mg | ORAL_TABLET | Freq: Four times a day (QID) | ORAL | Status: DC | PRN

## 2020-11-21 MED ORDER — PREDNISONE 5 MG PO TABS
10.0000 mg | ORAL_TABLET | Freq: Four times a day (QID) | ORAL | Status: DC
  Administered 2020-11-21 – 2020-11-25 (×18): 10 mg via ORAL
  Filled 2020-11-21: qty 2
  Filled 2020-11-21 (×2): qty 1
  Filled 2020-11-21 (×2): qty 2
  Filled 2020-11-21 (×3): qty 1
  Filled 2020-11-21 (×2): qty 2
  Filled 2020-11-21 (×2): qty 1
  Filled 2020-11-21: qty 2
  Filled 2020-11-21 (×2): qty 1
  Filled 2020-11-21 (×2): qty 2
  Filled 2020-11-21: qty 1

## 2020-11-21 MED ORDER — LACTATED RINGERS IV SOLN: INTRAVENOUS | Status: DC

## 2020-11-21 MED ORDER — MESALAMINE ER 250 MG PO CPCR
1500.0000 mg | ORAL_CAPSULE | Freq: Every day | ORAL | Status: DC
  Administered 2020-11-21 – 2020-11-25 (×5): 1500 mg via ORAL
  Filled 2020-11-21 (×5): qty 6

## 2020-11-21 MED ORDER — ORAL CARE MOUTH RINSE
15.0000 mL | Freq: Two times a day (BID) | OROMUCOSAL | Status: DC
  Administered 2020-11-21 – 2020-11-25 (×9): 15 mL via OROMUCOSAL

## 2020-11-21 MED ORDER — CHLORHEXIDINE GLUCONATE CLOTH 2 % EX PADS
6.0000 | MEDICATED_PAD | Freq: Every day | CUTANEOUS | Status: DC
  Administered 2020-11-21 – 2020-11-25 (×4): 6 via TOPICAL

## 2020-11-21 MED ORDER — ONDANSETRON HCL 4 MG PO TABS: 4.0000 mg | ORAL_TABLET | Freq: Four times a day (QID) | ORAL | Status: DC | PRN

## 2020-11-21 MED ORDER — ONDANSETRON HCL 4 MG/2ML IJ SOLN: 4.0000 mg | Freq: Four times a day (QID) | INTRAMUSCULAR | Status: DC | PRN

## 2020-11-21 NOTE — TOC Initial Note (Signed)
Transition of Care [facility]) - Initial/Assessment Note    Patient Details  Name: Bill Fox MRN: [id number] Date of Birth: 1941-04-11  Transition of Care [facility]) CM/SW Contact:    Leeroy Cha, RN Phone Number: 11/21/2020, 8:07 AM  Clinical Narrative:                 80 y.o. male with medical history significant for HTN, DM, Ulcerative Colitis, hx of PE and not on current anticoagulation who presents for evaluation of dyspnea and left shoulder pain.  He reports he has been having worsening dyspnea for the last few weeks.  He states that he woke up this morning with increased shortness of breath that has gotten much worse and is occurring at rest as well as with ambulation and exertion.  He reports he also noticed that he had some pain in his left shoulder blade region today that started when he was having increased shortness of breath.  He has not had any fever or chills.  He has not had any cough.  With his shortness of breath occurring with even minimal exertion he became very concerned and went to the local fire department where he knows the fireman and paramedics and they checked his oxygen saturation and found to be 75% on room air and his heart rate was in the 150s so EMS was called.  He was brought to the emergency room on a nonrebreather and given nebulizer breathing treatments and Solu-Medrol.  In the emergency room he has been weaned to a nasal cannula and is maintaining his oxygen saturations.  He states that he feels better after just sitting here for a while.  He denies any recent prolonged immobilization or travel.  States he does not drive around in [place] in the country for relaxation but he does not go for more than 1 to 2 hours he does get out and walk around at times when he does this.  He has not had any medication changes recently.  He does have a history of ulcerative colitis and has been on prednisone 40 mg a day for the last 3 weeks per his gastroenterologist. He lives  alone.  He has a history of smoking but states he quit 1988.  He denies alcohol or illicit drug use.   ED Course: In the emergency room he is remained hemodynamically stable.  Initial troponin was 205 and increased to 284 a few hours later.  He did not have any acute ischemia on EKG.  D-dimer was greater than 20.  CT angiography of the chest revealed extensive pulmonary embolism causing right heart strain with an RV to LV ratio 1.7.  CBC shows WBC of 9600, hemoglobin 14.9, hematocrit 45.3, platelet 218,000.  CMP shows a sodium 135, potassium 4.0, chloride 101, bicarb 22, creatinine 1.05, BUN 19, glucose 134, anion gap 12, LFTs normal.  COVID-19 swab is negative.  Influenza A and B swab are negative.  Patient was started on heparin infusion in the emergency room.  Hospitalist service was asked to admit for further management.  ER physician did discuss with PCCM and recommended staying on heparin and if patient decompensates clinically then would need tPA and they would be glad to formally consult at that time. TOC PLAN OF CARE: to return to home with self care.  From home lives with wife.  Expected Discharge Plan: Home/Self Care Barriers to Discharge: Continued Medical Work up   Patient Goals and CMS Choice  Expected Discharge Plan and Services Expected Discharge Plan: Home/Self Care   Discharge Planning Services: CM Consult   Living arrangements for the past 2 months: Single Family Home                                      Prior Living Arrangements/Services Living arrangements for the past 2 months: Single Family Home Lives with:: Spouse Patient language and need for interpreter reviewed:: Yes Do you feel safe going back to the place where you live?: Yes            Criminal Activity/Legal Involvement Pertinent to Current Situation/Hospitalization: No - Comment as needed  Activities of Daily Living      Permission Sought/Granted                  Emotional  Assessment Appearance:: Appears stated age Attitude/Demeanor/Rapport: Engaged Affect (typically observed): Calm Orientation: : Oriented to Place, Oriented to Self, Oriented to  Time, Oriented to Situation Alcohol / Substance Use: Not Applicable Psych Involvement: No (comment)  Admission diagnosis:  Bilateral pulmonary embolism ([place]) [I26.99] Acute saddle pulmonary embolism, unspecified whether acute cor pulmonale present (HCC) [I26.92] Patient Active Problem List   Diagnosis Date Noted   Cor pulmonale, acute ([place]) 11/21/2020   Thrombocytopenia ([place]) 11/21/2020   NSTEMI (non-ST elevated myocardial infarction) ([place]) 11/21/2020   Ulcerative colitis ([place]) 11/21/2020   Bilateral pulmonary embolism ([place]) 11/20/2020   Herpes zoster without complication 86/[birth year]/1950   Urinary frequency 04/27/2018   Dyslipidemia 03/11/2018   Light headed 01/06/2017   Dizziness 07/12/2015   Left sided colitis ([place])    Hypothyroidism 05/01/2015   Solitary pulmonary nodule 02/01/2015   Cerumen impaction 03/03/2014   Leg swelling 06/10/2013   Knee pain, right 06/10/2013   Scotoma 09/10/2012   Seborrheic keratosis 09/03/2011   Mechanical back pain 11/07/2010   MICROSCOPIC HEMATURIA 08/01/2009   ANAL FISSURE 07/17/2009   Insomnia 06/27/2009   Cough 05/26/2009   HEADACHE 02/16/2009   FATIQUE AND MALAISE 10/21/2008   PULMONARY EMBOLISM AND INFARCTION 07/20/2008   Other iron deficiency anemias 07/02/2007   RECTAL BLEEDING 07/02/2007   Hyperlipidemia LDL goal <130 07/01/2007   Morbid obesity ([place]) 07/01/2007   Essential hypertension 07/01/2007   BENIGN POSITIONAL VERTIGO, HX OF 07/01/2007   COLONIC POLYPS, HX OF 07/01/2007   PCP:  [name], DO Pharmacy:   [facility], [place] [address] Phone: [phone] Fax: [phone]  [facility] [place], [place] [address] [place] [address] Phone: [phone] Fax: [phone]     Social Determinants of Health (SDOH) Interventions    Readmission Risk Interventions No flowsheet data found.

## 2020-11-21 NOTE — Progress Notes (Addendum)
ANTICOAGULATION CONSULT NOTE  Pharmacy Consult for Heparin Indication:  saddle pulmonary embolus  No Known Allergies  Patient Measurements: Height: 6' (182.9 cm) Weight: 114.5 kg (252 lb 6.8 oz) IBW/kg (Calculated) : 77.6 Heparin Dosing Weight: 102 kg  Vital Signs: Temp: 98.5 F (36.9 C) (07/05 1600) Temp Source: Oral (07/05 1600) BP: 116/59 (07/05 0800) Pulse Rate: 71 (07/05 0800)  Labs: Recent Labs    11/20/20 1608 11/20/20 1616 11/20/20 2145 11/21/20 0409 11/21/20 0500 11/21/20 0802 11/21/20 0907 11/21/20 1457  HGB 14.9  --   --  14.8  --   --  15.3  --   HCT 45.3  --   --  45.8  --   --  47.1  --   PLT 118*  --   --  124*  --   --  PLATELET CLUMPS NOTED ON SMEAR, UNABLE TO ESTIMATE  --   APTT  --   --  30  --   --   --   --   --   LABPROT  --   --  14.9  --   --   --   --   --   INR  --   --  1.2  --   --   --   --   --   HEPARINUNFRC  --   --   --   --   --  0.36  --  0.32  CREATININE 1.05  --   --  1.00  --   --   --   --   TROPONINIHS  --    < > 284* 211* 198* 129* 136*  --    < > = values in this interval not displayed.   Estimated Creatinine Clearance: 80.9 mL/min (by C-G formula based on SCr of 1 mg/dL).  Medications:  No oral anticoagulation PTA  Assessment: 80 yr male with shortness of breath and left shoulder pain PMH significant for HTN, HLD, PE (not currently on anticoagulation) CTAngio = + saddle PE with RH strain Heparin 3000 unit bolus, infusion begun at 1650 units/hr Baseline aPTT 30 sec, INR 1.2  Goal of Therapy:  Heparin level 0.3-0.7 units/ml Monitor platelets by anticoagulation protocol: Yes  Today, 11/21/2020: Confirmatory heparin level remains therapeutic but borderline low on 1650 units/hr Hgb stable WNL; Plt low but remains stable so far > 100k SCr at baseline; stable WNL Frequent BMs today with small amounts of blood noted - Hx UC and currently at baseline per patient No current chest pain, but remains on 2L O2 (none at  baseline)   Plan:  Increase heparin to 1750 units/hr given borderline low level and saddle PE Daily CBC and heparin level Monitor for signs and symptoms of bleeding (ie changes in stool blood) F/u transition to long-term anticoagulation  Ernestine Langworthy A, PharmD 11/21/2020,6:04 PM

## 2020-11-21 NOTE — Progress Notes (Signed)
  Echocardiogram 2D Echocardiogram has been performed.  Bill Fox 11/21/2020, 8:44 AM

## 2020-11-21 NOTE — H&P (Signed)
History and Physical    Bill Fox ZCH:[id number] DOB: [birth year] DOA: 11/20/2020  PCP: [name], DO   Patient coming from: Home  Chief Complaint: SOB  HPI: Bill Fox is a 80 y.o. male with medical history significant for HTN, DM, Ulcerative Colitis, hx of PE and not on current anticoagulation who presents for evaluation of dyspnea and left shoulder pain.  He reports he has been having worsening dyspnea for the last few weeks.  He states that he woke up this morning with increased shortness of breath that has gotten much worse and is occurring at rest as well as with ambulation and exertion.  He reports he also noticed that he had some pain in his left shoulder blade region today that started when he was having increased shortness of breath.  He has not had any fever or chills.  He has not had any cough.  With his shortness of breath occurring with even minimal exertion he became very concerned and went to the local fire department where he knows the fireman and paramedics and they checked his oxygen saturation and found to be 75% on room air and his heart rate was in the 150s so EMS was called.  He was brought to the emergency room on a nonrebreather and given nebulizer breathing treatments and Solu-Medrol.  In the emergency room he has been weaned to a nasal cannula and is maintaining his oxygen saturations.  He states that he feels better after just sitting here for a while.  He denies any recent prolonged immobilization or travel.  States he does not drive around in [place] the [place] for [place] but he does not go for more than 1 to 2 hours he does get out and walk around at times when he does this.  He has not had any medication changes recently.  He does have a history of ulcerative colitis and has been on prednisone 40 mg a day for the last 3 weeks per his gastroenterologist. He lives alone.  He has a history of smoking but states he quit 1988.  He denies  alcohol or illicit drug use.  ED Course: In the emergency room he is remained hemodynamically stable.  Initial troponin was 205 and increased to 284 a few hours later.  He did not have any acute ischemia on EKG.  D-dimer was greater than 20.  CT angiography of the chest revealed extensive pulmonary embolism causing right heart strain with an RV to LV ratio 1.7.  CBC shows WBC of 9600, hemoglobin 14.9, hematocrit 45.3, platelet 218,000.  CMP shows a sodium 135, potassium 4.0, chloride 101, bicarb 22, creatinine 1.05, BUN 19, glucose 134, anion gap 12, LFTs normal.  COVID-19 swab is negative.  Influenza A and B swab are negative.  Patient was started on heparin infusion in the emergency room.  Hospitalist service was asked to admit for further management.  ER physician did discuss with PCCM and recommended staying on heparin and if patient decompensates clinically then would need tPA and they would be glad to formally consult at that time.  Review of Systems:  General: Denies fever, chills, weight loss, night sweats. Denies dizziness. Denies change in appetite HENT: Denies head trauma, headache, denies change in hearing, tinnitus. Denies nasal congestion or bleeding.  Denies sore throat, sores in mouth.  Denies difficulty swallowing Eyes: Denies blurry vision, pain in eye, drainage.  Denies discoloration of eyes. Neck: Denies pain.  Denies swelling.  Denies pain  with movement. Cardiovascular: Denies chest pain, palpitations.  Denies edema.  Denies orthopnea Respiratory: Reports shortness of breath, cough.  Denies wheezing.  Denies sputum production Gastrointestinal: Denies abdominal pain, swelling.  Denies nausea, vomiting.  Denies melena.  Denies hematemesis. Musculoskeletal: Denies limitation of movement.  Denies deformity or swelling.  Denies pain.  Denies arthralgias or myalgias. Genitourinary: Denies pelvic pain.  Denies urinary frequency or hesitancy.  Denies dysuria.  Skin: Denies rash.  Denies  petechiae, purpura, ecchymosis. Neurological: Denies syncope. Denies seizure activity. Denies paresthesia. Denies slurred speech, drooping face.  Denies visual change. Psychiatric: Denies depression, anxiety. Denies hallucinations.  Past Medical History:  Diagnosis Date   Anxiety    At risk for sleep apnea    STOP-BANG= 5     SENT TO PCP 11-29-2013   Cataract    Chronic fatigue    DJD (degenerative joint disease)    RIGHT KNEE   History of colon polyps    2011  &  2013 --  ADENOMATOUS   History of pulmonary embolism    Hyperlipidemia    pt denies ever having high chol   Hypertension    Right knee meniscal tear    Ulcerative colitis     Past Surgical History:  Procedure Laterality Date   CATARACT EXTRACTION W/ INTRAOCULAR LENS IMPLANT Left 2014   COLONOSCOPY W/ POLYPECTOMY  MULTIPLE--  LAST ONE 09-12-2011   KNEE ARTHROSCOPY WITH DRILLING/MICROFRACTURE Right 12/03/2013   Procedure: RIGHT KNEE ARTHROSCOPY PARTIAL MEDIAL MENISCECTOMY DEBRIDEMENT SYNOVECTOMY;  Surgeon: Johnn Hai, MD;  Location: [facility];  Service: Orthopedics;  Laterality: Right;   NASAL FRACTURE SURGERY  1985   TRANSTHORACIC ECHOCARDIOGRAM  11-01-2008   MILD LVH/  EF 64%/  GRADE I DIASTOLIC DYSFUNCTION/  MILD LAE    Social History  reports that he quit smoking about 27 years ago. His smoking use included cigarettes. He has a 30.00 pack-year smoking history. He has never used smokeless tobacco. He reports current alcohol use of about 1.0 standard drink of alcohol per week. He reports that he does not use drugs.  No Known Allergies  Family History  Problem Relation Age of Onset   Diabetes Mother    Stomach cancer Father    Lung cancer Brother    Diabetes Paternal Uncle        x 2   Colon cancer Neg Hx    Pancreatic cancer Neg Hx    Esophageal cancer Neg Hx    Colon polyps Neg Hx      Prior to Admission medications   Medication Sig Start Date End Date Taking? Authorizing Provider   doxazosin (CARDURA) 8 MG tablet TAKE ONE-HALF (1/2) TABLET AT BEDTIME 08/14/20   Wendling, Crosby Oyster, DO  mesalamine (APRISO) 0.375 g 24 hr capsule Take 4 capsules (1.5 g total) by mouth daily. 10/02/20   Milus Banister, MD  mirtazapine (REMERON) 30 MG tablet Take 1 tablet (30 mg total) by mouth at bedtime. 05/08/20   [name], DO  predniSONE (DELTASONE) 10 MG tablet Take 4 tablets (40 mg total) by mouth daily with breakfast. Take 2m daily for 1 week, then 356mdaily for 1 week, then 2060maily for 1 week, then 5m15mily for 1 week, then Stop 11/16/20   PerrIrene Shipper    Physical Exam: Vitals:   11/20/20 2130 11/20/20 2200 11/20/20 2230 11/20/20 2300  BP: 137/84 129/80 113/79 120/84  Pulse: 88 78 77 77  Resp: (!) 21 19  20 18  Temp:      TempSrc:      SpO2: 97% 97% 96% 98%  Weight:      Height:        Constitutional: NAD, calm, comfortable Vitals:   11/20/20 2130 11/20/20 2200 11/20/20 2230 11/20/20 2300  BP: 137/84 129/80 113/79 120/84  Pulse: 88 78 77 77  Resp: (!) 21 19 20 18   Temp:      TempSrc:      SpO2: 97% 97% 96% 98%  Weight:      Height:       General: WDWN, Alert and oriented x3.  Eyes: EOMI, PERRL, conjunctivae normal.  Sclera nonicteric HENT:  /AT, external ears normal.  Nares patent without epistasis.  Mucous membranes are moist.  Neck: Soft, normal range of motion, supple, no masses, no thyromegaly.  Trachea midline Respiratory: Equal but diminished breath sounds, Mild rales diffusely. No rhonchi. no wheezing, no crackles. Normal respiratory effort. No accessory muscle use.  Cardiovascular: Regular rate and rhythm, no murmurs / rubs / gallops. No extremity edema.  Abdomen: Soft, no tenderness, nondistended, no rebound or guarding.  No masses palpated. Obese. Bowel sounds normoactive Musculoskeletal: FROM. no cyanosis. No joint deformity upper and lower extremities. Normal muscle tone.  Negative Homan's sign bilaterally Skin: Warm, dry,  intact no rashes, lesions, ulcers. No induration Neurologic: CN 2-12 grossly intact.  Normal speech.  Sensation intact to touch. Strength 5/5 in all extremities. Normal Gait Psychiatric: Normal judgment and insight.  Normal mood.    Labs on Admission: I have personally reviewed following labs and imaging studies  CBC: Recent Labs  Lab 11/17/20 0916 11/20/20 1608  WBC 6.5 9.6  NEUTROABS 3.6 8.5*  HGB 15.5 14.9  HCT 46.7 45.3  MCV 86.0 87.5  PLT 163.0 118*    Basic Metabolic Panel: Recent Labs  Lab 11/17/20 0916 11/20/20 1608  NA 139 135  K 4.0 4.0  CL 106 101  CO2 23 22  GLUCOSE 90 134*  BUN 16 19  CREATININE 1.07 1.05  CALCIUM 8.9 8.2*    GFR: Estimated Creatinine Clearance: 76.9 mL/min (by C-G formula based on SCr of 1.05 mg/dL).  Liver Function Tests: Recent Labs  Lab 11/17/20 0916 11/20/20 1608  AST 12 25  ALT 17 32  ALKPHOS 53 45  BILITOT 0.3 0.8  PROT 7.6 7.1  ALBUMIN 3.9 3.2*    Urine analysis:    Component Value Date/Time   COLORURINE YELLOW 01/02/2017 0926   APPEARANCEUR CLEAR 01/02/2017 0926   LABSPEC 1.010 01/02/2017 0926   PHURINE 5.5 01/02/2017 0926   GLUCOSEU NEGATIVE 01/02/2017 0926   HGBUR SMALL (A) 01/02/2017 0926   BILIRUBINUR negative 04/27/2018 1404   KETONESUR NEGATIVE 01/02/2017 0926   PROTEINUR Negative 04/27/2018 1404   PROTEINUR 100 (A) 07/06/2008 1225   UROBILINOGEN negative (A) 04/27/2018 1404   UROBILINOGEN 0.2 01/02/2017 0926   NITRITE negative 04/27/2018 1404   NITRITE NEGATIVE 01/02/2017 0926   LEUKOCYTESUR Negative 04/27/2018 1404    Radiological Exams on Admission: CT Angio Chest PE W/Cm &/Or Wo Cm  Result Date: 11/20/2020 CLINICAL DATA:  Shortness of breath for 3 weeks. History of blood clots. Pulmonary embolus suspected with high probability. EXAM: CT ANGIOGRAPHY CHEST WITH CONTRAST TECHNIQUE: Multidetector CT imaging of the chest was performed using the standard protocol during bolus administration of  intravenous contrast. Multiplanar CT image reconstructions and MIPs were obtained to evaluate the vascular anatomy. CONTRAST:  69m OMNIPAQUE IOHEXOL 350 MG/ML SOLN COMPARISON:  Chest radiograph 11/20/2020.  CT chest 02/06/2015 FINDINGS: Cardiovascular: Good opacification of the central and segmental pulmonary arteries. Multiple filling defects demonstrated in the main, lobar, and nearly all proximal segmental pulmonary arteries. Changes are consistent with acute pulmonary embolus with large clot burden. RV to LV ratio is elevated at 1.7 suggesting evidence of right heart strain. No pericardial effusion. Normal caliber thoracic aorta. No aortic dissection. Mediastinum/Nodes: Calcified lymph nodes in the mediastinum. No significant lymphadenopathy. Esophagus is decompressed. Thyroid gland is unremarkable. Lungs/Pleura: Motion artifact limits examination. Patchy peripheral infiltration or atelectasis. Airways are patent. No pleural effusions. No pneumothorax. Upper Abdomen: Cholelithiasis. No evidence of cholecystitis. Calcified granulomas in the spleen. Musculoskeletal: Degenerative changes in the spine. Review of the MIP images confirms the above findings. IMPRESSION: 1. Positive for acute pulmonary embolus with large saddle embolus and extension into lobar pulmonary arteries and most segmental arteries. Positive for acute PE with CT evidence of right heart strain (RV/LV Ratio = 1.7) consistent with at least submassive (intermediate risk) PE. The presence of right heart strain has been associated with an increased risk of morbidity and mortality. Please refer to the "PE Focused" order set in EPIC. 2. Patchy infiltration or atelectasis in the posterior lungs. 3. Cholelithiasis. Critical Value/emergent results were called by telephone at the time of interpretation on 11/20/2020 at 11:00 pm to provider [facility] , who verbally acknowledged these results. Electronically Signed   By: Lucienne Capers M.D.   On:  11/20/2020 23:06   DG Chest Port 1 View  Result Date: 11/20/2020 CLINICAL DATA:  Shoulder blade pain. EXAM: PORTABLE CHEST 1 VIEW COMPARISON:  01/02/2017 FINDINGS: Single view of the chest demonstrates mild haziness in the left lower chest which could be related to atelectasis. Otherwise, the lungs are clear. Slightly low lung volumes. Heart size is within normal limits. Trachea is midline. Negative for a pneumothorax. Degenerative changes at both [facility] joints. IMPRESSION: Low lung volumes with probable atelectasis in the left lower chest. Electronically Signed   By: Markus Daft M.D.   On: 11/20/2020 15:36    EKG: Independently reviewed.  EKG shows sinus tachycardia with inferior T wave changes but no acute elevation or depression.  QTc 449  Assessment/Plan Principal Problem:   Bilateral pulmonary embolism  Bill Fox is admitted to the ICU with extensive pulmonary embolism causing right heart strain. Placed on heparin for therapeutic anticoagulation.  Pharmacy to monitor heparin Patient is currently hemodynamically stable.  If his condition decompensates he developed hypotension or worsening hypoxia will consult PCCM for official consultation and start tPA.  Discussed risks and benefits of tPA with patient if it is required and he states that he would want it. Obtain echocardiogram in the morning to evaluate wall motion, size, EF, valvular function.   Active Problems:   Essential hypertension Monitor blood pressure.  Patient reports he takes Cardura at night for his blood pressure and this will be continued.    Cor pulmonale, acute  Secondary to PE.  Echocardiogram in morning    NSTEMI (non-ST elevated myocardial infarction)  Patient with initial troponin level of 205 which increased to 284.  We will check serial troponins through the night.  Patient is on heparin for therapeutic anticoagulation.  Echocardiogram will be obtained in the morning and the patient will need cardiology consult     Thrombocytopenia No active bleeding or bruising.  Repeat CBC in morning    Ulcerative colitis  Chronic.  Patient is on prednisone 40 mg a day for the  past 3 weeks.  We will continue and will need to taper prednisone instructed by his GI physician    Morbid obesity Patient follow-up with PCP for dietary, lifestyle, pharmacotherapy interventions for long-term weight loss     DVT prophylaxis: Is placed on heparin for therapeutic anticoagulation with extensive pulmonary embolism Code Status:   Full code Family Communication:  Diagnosis and plan discussed with patient.  Patient verbalized understanding agrees with plan.  Further recommendations to follow as clinically indicated Disposition Plan:   Patient is from:  Home  Anticipated DC to:  Home  Anticipated DC date:  Anticipate more than 2 midnight stay in the hospital  Anticipated DC barriers: No barriers to discharge identified this time  Consults:     PCCM was consulted by ER physician and gave her indication that if patient's condition decompensated with hypotension and/or worsening hypoxia they will see patient and would recommend tPA at that point Admission status:  Inpatient   Eben Burow MD [facility]  How to contact the [facility] Attending or Consulting provider [place] or covering provider during after hours [place], for this patient?   Check the care team in [facility] and look for a) attending/consulting [facility] provider listed and b) the [facility] team listed Log into [email or web address] and use [place]'s universal password to access. If you do not have the password, please contact the hospital operator. Locate the [facility] provider you are looking for under [facility] and page to a number that you can be directly reached. If you still have difficulty reaching the provider, please page the [facility] (Director on Call) for the Hospitalists listed on amion for assistance.  11/21/2020, 12:15 AM

## 2020-11-21 NOTE — Progress Notes (Signed)
[facility]                                                                              Patient Demographics  Bill Fox, is a 80 y.o. male, DOB - [birth year], MMH:[id number]  Admit date - 11/20/2020   Admitting Physician Eben Burow, MD  Outpatient Primary MD for the patient is Nani Ravens Crosby Oyster, DO  Outpatient specialists:   LOS - 1  days   Medical records reviewed and are as summarized below:    Chief Complaint  Patient presents with   Shortness of Breath       Brief summary   Patient is a 80 year old male with history of hypertension, DM, ulcerative colitis, history of PE 12 years ago (does not remember if he was on [facility]), not on current anticoagulation presented for dyspnea and left shoulder pain.  Patient was having worsening dyspnea over the past few weeks however woke up on the morning of admission with increased shortness of breath, occurring at rest and with ambulation and exertion.  He also noticed some pain in the left shoulder blade region with increasing shortness of breath.  No fevers or chills.  No coughing.  O2 sats 75% on room air when checked by the paramedics at [facility] and his heart rate was in 150s, so EMS was called.  Patient was brought to ED on a nonrebreather, given nebulizer breathing treatment, Solu-Medrol.  In ED he was weaned to nasal cannula and felt better.  Patient has history of ulcerative colitis and has been on 5 mg daily for the last few weeks per his GI, Dr. Henrene Pastor. D-dimer> 20, initial troponin 205, increased to 284, no acute ischemia on EKG CT angiogram chest showed extensive pulmonary embolism causing right heart strain.  COVID-19 negative. Patient was admitted to ICU PCCM was consulted by EDP however recommended staying on heparin and if decompensates clinically will need repeat tPA and they will consult.  Assessment & Plan    Principal Problem: Acute extensive bilateral pulmonary embolism (HCC) with right  heart strain, acute respiratory failure with hypoxia, acute cor pulmonale -CTA chest showed large saddle embolus and extension into the lobar pulmonary arteries and most segmental arteries, right heart strain submassive PE.  Patchy infiltration of atelectasis in the posterior lungs, cholelithiasis -Currently O2 sats 96 to 98% on 2 L, hemodynamically stable, will continue IV heparin drip -If decompensates clinically, will need tPA -Follow 2D echo, Doppler US lower extremities    Active Problems: Demand ischemia, elevated troponin/NSTEMI -Likely secondary to acute large saddle PE, currently on IV heparin drip.  BNP 42.9 -Repeat troponins trending down, follow 2D echo, if severely depressed EF, will consult cardiology -Previous echo in the system from 10/2008 had shown EF of 55% with G1 DD     Essential hypertension -BP currently stable, follow closely     Thrombocytopenia (HCC) - PLT 118K on admission, improved to 124, follow closely while on heparin drip  History of chronic ulcerative colitis ([place]) -Continue prednisone, 40 mg daily for the past 3 weeks per his GI, Dr. Blanch Media recommendations. Taper per GI outpatient    Obesity Estimated  body mass index is 34.24 kg/m as calculated from the following:   Height as of this encounter: 6' (1.829 m).   Weight as of this encounter: 114.5 kg.  Code Status: Full CODE STATUS DVT Prophylaxis:    On heparin drip   Level of Care: Level of care: ICU Family Communication: Discussed all imaging results, lab results, explained to the patient    Disposition Plan:     Status is: Inpatient  Remains inpatient appropriate because:Inpatient level of care appropriate due to severity of illness  Dispo: The patient is from: Home              Anticipated d/c is to: Home              Patient currently is not medically stable to d/c.  Hypoxia with acute large saddle pulmonary embolism   Difficult to place patient No      Time Spent in minutes    35 minutes  Procedures:  CT angiogram chest, 2D echo  Consultants:   CCM  Antimicrobials:   Anti-infectives (From admission, onward)    None          Medications  Scheduled Meds:  Chlorhexidine Gluconate Cloth  6 each Topical Q0600   mouth rinse  15 mL Mouth Rinse BID   mesalamine  1,500 mg Oral Daily   predniSONE  10 mg Oral Q6H   Continuous Infusions:  heparin 1,650 Units/hr (11/21/20 0800)   lactated ringers 100 mL/hr at 11/21/20 0800   PRN Meds:.acetaminophen **OR** acetaminophen, albuterol, ondansetron **OR** ondansetron (ZOFRAN) IV      Subjective:   Bill Fox was seen and examined today.  Feeling little better from the time of admission, still somewhat shortness of breath with exertion.  On 2 L O2 via nasal cannula, sats 94%. Patient denies dizziness, chest pain,  abdominal pain, N/V/D/C . No acute events overnight.    Objective:   Vitals:   11/21/20 0500 11/21/20 0510 11/21/20 0600 11/21/20 0800  BP:  (!) 121/58 118/67 (!) 116/59  Pulse: 65  (!) 59 71  Resp: (!) 25  (!) 23 (!) 31  Temp:    98.3 F (36.8 C)  TempSrc:    Oral  SpO2: 97%  95% 98%  Weight:      Height:        Intake/Output Summary (Last 24 hours) at 11/21/2020 1033 Last data filed at 11/21/2020 0800 Gross per 24 hour  Intake 579.52 ml  Output 100 ml  Net 479.52 ml     Wt Readings from Last 3 Encounters:  11/21/20 114.5 kg  10/12/20 118.1 kg  08/22/20 123.4 kg     Exam General: Alert and oriented x 3, NAD Cardiovascular: S1 S2 auscultated, no murmurs, RRR Respiratory: Diminished breath sounds both lungs, bibasilar rales Gastrointestinal: Soft, nontender, nondistended, + bowel sounds Ext: no pedal edema bilaterally Neuro: no new deficits Musculoskeletal: No digital cyanosis, clubbing Skin: No rashes Psych: Normal affect and demeanor, alert and oriented x3    Data Reviewed:  I have personally reviewed following labs and imaging studies  Micro Results Recent  Results (from the past 240 hour(s))  Resp Panel by RT-PCR (Flu A&B, Covid) Nasopharyngeal Swab     Status: None   Collection Time: 11/20/20  4:08 PM   Specimen: Nasopharyngeal Swab; Nasopharyngeal(NP) swabs in vial transport medium  Result Value Ref Range Status   SARS Coronavirus 2 by RT PCR NEGATIVE NEGATIVE Final    Comment: (NOTE) SARS-CoV-2 target nucleic  acids are NOT DETECTED.  The SARS-CoV-2 RNA is generally detectable in upper respiratory specimens during the acute phase of infection. The lowest concentration of SARS-CoV-2 viral copies this assay can detect is 138 copies/mL. A negative result does not preclude SARS-Cov-2 infection and should not be used as the sole basis for treatment or other patient management decisions. A negative result may occur with  improper specimen collection/handling, submission of specimen other than nasopharyngeal swab, presence of viral mutation(s) within the areas targeted by this assay, and inadequate number of viral copies(<138 copies/mL). A negative result must be combined with clinical observations, patient history, and epidemiological information. The expected result is Negative.  Fact Sheet for Patients:  EntrepreneurPulse.com.au  Fact Sheet for Healthcare Providers:  IncredibleEmployment.be  This test is no t yet approved or cleared by the [place] FDA and  has been authorized for detection and/or diagnosis of SARS-CoV-2 by FDA under an Emergency Use Authorization (EUA). This EUA will remain  in effect (meaning this test can be used) for the duration of the COVID-19 declaration under Section 564(b)(1) of the Act, 21 U.S.C.section 360bbb-3(b)(1), unless the authorization is terminated  or revoked sooner.       Influenza A by PCR NEGATIVE NEGATIVE Final   Influenza B by PCR NEGATIVE NEGATIVE Final    Comment: (NOTE) The Xpert Xpress SARS-CoV-2/FLU/RSV plus assay is intended as an aid in the  diagnosis of influenza from Nasopharyngeal swab specimens and should not be used as a sole basis for treatment. Nasal washings and aspirates are unacceptable for Xpert Xpress SARS-CoV-2/FLU/RSV testing.  Fact Sheet for Patients: EntrepreneurPulse.com.au  Fact Sheet for Healthcare Providers: IncredibleEmployment.be  This test is not yet approved or cleared by the [place] FDA and has been authorized for detection and/or diagnosis of SARS-CoV-2 by FDA under an Emergency Use Authorization (EUA). This EUA will remain in effect (meaning this test can be used) for the duration of the COVID-19 declaration under Section 564(b)(1) of the Act, 21 U.S.C. section 360bbb-3(b)(1), unless the authorization is terminated or revoked.  Performed at [facility], [place] [address], [place], [place] [address]   MRSA Next Gen by PCR, Nasal     Status: None   Collection Time: 11/21/20  3:01 AM   Specimen: Nasal Mucosa; Nasal Swab  Result Value Ref Range Status   MRSA by PCR Next Gen NOT DETECTED NOT DETECTED Final    Comment: (NOTE) The GeneXpert MRSA Assay (FDA approved for NASAL specimens only), is one component of a comprehensive MRSA colonization surveillance program. It is not intended to diagnose MRSA infection nor to guide or monitor treatment for MRSA infections. Test performance is not FDA approved in patients less than 23 years old. Performed at [facility], [place] [address], [place], [place] [address]     Radiology Reports CT Angio Chest PE W/Cm &/Or Wo Cm  Result Date: 11/20/2020 CLINICAL DATA:  Shortness of breath for 3 weeks. History of blood clots. Pulmonary embolus suspected with high probability. EXAM: CT ANGIOGRAPHY CHEST WITH CONTRAST TECHNIQUE: Multidetector CT imaging of the chest was performed using the standard protocol during bolus administration of intravenous contrast. Multiplanar CT image  reconstructions and MIPs were obtained to evaluate the vascular anatomy. CONTRAST:  18m OMNIPAQUE IOHEXOL 350 MG/ML SOLN COMPARISON:  Chest radiograph 11/20/2020.  CT chest 02/06/2015 FINDINGS: Cardiovascular: Good opacification of the central and segmental pulmonary arteries. Multiple filling defects demonstrated in the main, lobar, and nearly all proximal segmental pulmonary arteries. Changes  are consistent with acute pulmonary embolus with large clot burden. RV to LV ratio is elevated at 1.7 suggesting evidence of right heart strain. No pericardial effusion. Normal caliber thoracic aorta. No aortic dissection. Mediastinum/Nodes: Calcified lymph nodes in the mediastinum. No significant lymphadenopathy. Esophagus is decompressed. Thyroid gland is unremarkable. Lungs/Pleura: Motion artifact limits examination. Patchy peripheral infiltration or atelectasis. Airways are patent. No pleural effusions. No pneumothorax. Upper Abdomen: Cholelithiasis. No evidence of cholecystitis. Calcified granulomas in the spleen. Musculoskeletal: Degenerative changes in the spine. Review of the MIP images confirms the above findings. IMPRESSION: 1. Positive for acute pulmonary embolus with large saddle embolus and extension into lobar pulmonary arteries and most segmental arteries. Positive for acute PE with CT evidence of right heart strain (RV/LV Ratio = 1.7) consistent with at least submassive (intermediate risk) PE. The presence of right heart strain has been associated with an increased risk of morbidity and mortality. Please refer to the "PE Focused" order set in EPIC. 2. Patchy infiltration or atelectasis in the posterior lungs. 3. Cholelithiasis. Critical Value/emergent results were called by telephone at the time of interpretation on 11/20/2020 at 11:00 pm to provider [facility] , who verbally acknowledged these results. Electronically Signed   By: Lucienne Capers M.D.   On: 11/20/2020 23:06   DG Chest Port 1  View  Result Date: 11/20/2020 CLINICAL DATA:  Shoulder blade pain. EXAM: PORTABLE CHEST 1 VIEW COMPARISON:  01/02/2017 FINDINGS: Single view of the chest demonstrates mild haziness in the left lower chest which could be related to atelectasis. Otherwise, the lungs are clear. Slightly low lung volumes. Heart size is within normal limits. Trachea is midline. Negative for a pneumothorax. Degenerative changes at both [facility] joints. IMPRESSION: Low lung volumes with probable atelectasis in the left lower chest. Electronically Signed   By: Markus Daft M.D.   On: 11/20/2020 15:36    Lab Data:  CBC: Recent Labs  Lab 11/17/20 0916 11/20/20 1608 11/21/20 0409 11/21/20 0907  WBC 6.5 9.6 11.5* 11.3*  NEUTROABS 3.6 8.5*  --   --   HGB 15.5 14.9 14.8 15.3  HCT 46.7 45.3 45.8 47.1  MCV 86.0 87.5 87.7 86.9  PLT 163.0 118* 124* PLATELET CLUMPS NOTED ON SMEAR, UNABLE TO ESTIMATE   Basic Metabolic Panel: Recent Labs  Lab 11/17/20 0916 11/20/20 1608 11/21/20 0409  NA 139 135 135  K 4.0 4.0 4.1  CL 106 101 102  CO2 23 22 22   GLUCOSE 90 134* 127*  BUN 16 19 19   CREATININE 1.07 1.05 1.00  CALCIUM 8.9 8.2* 8.7*   GFR: Estimated Creatinine Clearance: 80.9 mL/min (by C-G formula based on SCr of 1 mg/dL). Liver Function Tests: Recent Labs  Lab 11/17/20 0916 11/20/20 1608  AST 12 25  ALT 17 32  ALKPHOS 53 45  BILITOT 0.3 0.8  PROT 7.6 7.1  ALBUMIN 3.9 3.2*   No results for input(s): LIPASE, AMYLASE in the last 168 hours. No results for input(s): AMMONIA in the last 168 hours. Coagulation Profile: Recent Labs  Lab 11/20/20 2145  INR 1.2   Cardiac Enzymes: No results for input(s): CKTOTAL, CKMB, CKMBINDEX, TROPONINI in the last 168 hours. BNP (last 3 results) No results for input(s): PROBNP in the last 8760 hours. HbA1C: No results for input(s): HGBA1C in the last 72 hours. CBG: Recent Labs  Lab 11/21/20 0801  GLUCAP 124*   Lipid Profile: No results for input(s): CHOL, HDL, LDLCALC,  TRIG, CHOLHDL, LDLDIRECT in the last 72 hours. Thyroid Function  Tests: No results for input(s): TSH, T4TOTAL, FREET4, T3FREE, THYROIDAB in the last 72 hours. Anemia Panel: No results for input(s): VITAMINB12, FOLATE, FERRITIN, TIBC, IRON, RETICCTPCT in the last 72 hours. Urine analysis:    Component Value Date/Time   COLORURINE YELLOW 01/02/2017 0926   APPEARANCEUR CLEAR 01/02/2017 0926   LABSPEC 1.010 01/02/2017 0926   PHURINE 5.5 01/02/2017 0926   GLUCOSEU NEGATIVE 01/02/2017 0926   HGBUR SMALL (A) 01/02/2017 0926   BILIRUBINUR negative 04/27/2018 1404   KETONESUR NEGATIVE 01/02/2017 0926   PROTEINUR Negative 04/27/2018 1404   PROTEINUR 100 (A) 07/06/2008 1225   UROBILINOGEN negative (A) 04/27/2018 1404   UROBILINOGEN 0.2 01/02/2017 0926   NITRITE negative 04/27/2018 1404   NITRITE NEGATIVE 01/02/2017 0926   LEUKOCYTESUR Negative 04/27/2018 1404     Jala Dundon M.D. [facility] 11/21/2020, 10:33 AM  Available via Epic secure chat 7am-7pm After 7 pm, please refer to night coverage provider listed on amion.

## 2020-11-21 NOTE — Progress Notes (Signed)
[place] for Heparin Indication:  saddle pulmonary embolus  No Known Allergies  Patient Measurements: Height: 6' (182.9 cm) Weight: 114.5 kg (252 lb 6.8 oz) IBW/kg (Calculated) : 77.6 Heparin Dosing Weight: 102 kg  Vital Signs: BP: 118/67 (07/05 0600) Pulse Rate: 59 (07/05 0600)  Labs: Recent Labs    11/20/20 1608 11/20/20 1616 11/20/20 2145 11/21/20 0409 11/21/20 0500 11/21/20 0802  HGB 14.9  --   --  14.8  --   --   HCT 45.3  --   --  45.8  --   --   PLT 118*  --   --  124*  --   --   APTT  --   --  30  --   --   --   LABPROT  --   --  14.9  --   --   --   INR  --   --  1.2  --   --   --   HEPARINUNFRC  --   --   --   --   --  0.36  CREATININE 1.05  --   --  1.00  --   --   TROPONINIHS  --    < > 284* 211* 198*  --    < > = values in this interval not displayed.   Estimated Creatinine Clearance: 80.9 mL/min (by C-G formula based on SCr of 1 mg/dL).  Medical History: Past Medical History:  Diagnosis Date   Anxiety    At risk for sleep apnea    STOP-BANG= 5     SENT TO PCP 11-29-2013   Cataract    Chronic fatigue    DJD (degenerative joint disease)    RIGHT KNEE   History of colon polyps    2011  &  2013 --  ADENOMATOUS   History of pulmonary embolism    Hyperlipidemia    pt denies ever having high chol   Hypertension    Right knee meniscal tear    Ulcerative colitis    Medications:  No oral anticoagulation PTA  Assessment: 80 yr male with shortness of breath and left shoulder pain PMH significant for HTN, HLD, PE (not currently on anticoagulation) CTAngio = + saddle PE Heparin 3000 unit bolus, infusion begun at 1650 units/hr Baseline aPTT 30 sec, INR 1.2  Goal of Therapy:  Heparin level 0.3-0.7 units/ml Monitor platelets by anticoagulation protocol: Yes  Today, 11/21/2020 0800 Hep level 0.36 units/ml, in therapeutic range Hgb wnl, Plt sl low 124   Plan:  Continue Heparin at 1650 units/hr Repeat Hep level  in 6 hr, confirmatory Daily CBC ordered, plan daily Hep level at steady state Monitor for signs and symptoms of bleeding  Minda Ditto, PharmD 11/21/2020,8:51 AM

## 2020-11-22 ENCOUNTER — Inpatient Hospital Stay (HOSPITAL_COMMUNITY): Payer: Medicare Other

## 2020-11-22 ENCOUNTER — Telehealth: Payer: Self-pay

## 2020-11-22 DIAGNOSIS — D696 Thrombocytopenia, unspecified: Secondary | ICD-10-CM

## 2020-11-22 DIAGNOSIS — K51919 Ulcerative colitis, unspecified with unspecified complications: Secondary | ICD-10-CM

## 2020-11-22 DIAGNOSIS — I2699 Other pulmonary embolism without acute cor pulmonale: Secondary | ICD-10-CM

## 2020-11-22 DIAGNOSIS — R0689 Other abnormalities of breathing: Secondary | ICD-10-CM

## 2020-11-22 DIAGNOSIS — I82412 Acute embolism and thrombosis of left femoral vein: Secondary | ICD-10-CM

## 2020-11-22 DIAGNOSIS — I10 Essential (primary) hypertension: Secondary | ICD-10-CM

## 2020-11-22 DIAGNOSIS — R0600 Dyspnea, unspecified: Secondary | ICD-10-CM

## 2020-11-22 DIAGNOSIS — I2692 Saddle embolus of pulmonary artery without acute cor pulmonale: Secondary | ICD-10-CM

## 2020-11-22 DIAGNOSIS — I214 Non-ST elevation (NSTEMI) myocardial infarction: Secondary | ICD-10-CM

## 2020-11-22 DIAGNOSIS — I82409 Acute embolism and thrombosis of unspecified deep veins of unspecified lower extremity: Secondary | ICD-10-CM | POA: Diagnosis present

## 2020-11-22 LAB — GLUCOSE, CAPILLARY
Glucose-Capillary: 129 mg/dL — ABNORMAL HIGH (ref 70–99)
Glucose-Capillary: 137 mg/dL — ABNORMAL HIGH (ref 70–99)
Glucose-Capillary: 180 mg/dL — ABNORMAL HIGH (ref 70–99)
Glucose-Capillary: 141 mg/dL — ABNORMAL HIGH (ref 70–99)

## 2020-11-22 LAB — BASIC METABOLIC PANEL
Anion gap: 7 (ref 5–15)
BUN: 23 mg/dL (ref 8–23)
CO2: 23 mmol/L (ref 22–32)
Calcium: 8.3 mg/dL — ABNORMAL LOW (ref 8.9–10.3)
Chloride: 104 mmol/L (ref 98–111)
Creatinine, Ser: 0.9 mg/dL (ref 0.61–1.24)
GFR, Estimated: >60 mL/min (ref >60)
Glucose, Bld: 116 mg/dL — ABNORMAL HIGH (ref 70–99)
Potassium: 6 mmol/L — ABNORMAL HIGH (ref 3.5–5.1)
Sodium: 134 mmol/L — ABNORMAL LOW (ref 135–145)

## 2020-11-22 LAB — MAGNESIUM: Magnesium: 2.3 mg/dL (ref 1.7–2.4)

## 2020-11-22 LAB — HEPARIN LEVEL (UNFRACTIONATED): Heparin Unfractionated: 0.35 IU/mL (ref 0.30–0.70)

## 2020-11-22 LAB — CBC
HCT: 45.4 % (ref 39.0–52.0)
Hemoglobin: 14.4 g/dL (ref 13.0–17.0)
MCH: 28.2 pg (ref 26.0–34.0)
MCHC: 31.7 g/dL (ref 30.0–36.0)
MCV: 88.8 fL (ref 80.0–100.0)
Platelets: 121 10*3/uL — ABNORMAL LOW (ref 150–400)
RBC: 5.11 MIL/uL (ref 4.22–5.81)
RDW: 14.3 % (ref 11.5–15.5)
WBC: 7.5 10*3/uL (ref 4.0–10.5)
nRBC: 0 % (ref 0.0–0.2)

## 2020-11-22 LAB — POTASSIUM: Potassium: 4.2 mmol/L (ref 3.5–5.1)

## 2020-11-22 MED ORDER — HEPARIN (PORCINE) 25000 UT/250ML-% IV SOLN
1850.0000 [IU]/h | INTRAVENOUS | Status: AC
  Administered 2020-11-22: 1850 [IU]/h via INTRAVENOUS
  Filled 2020-11-22: qty 250

## 2020-11-22 MED ORDER — DOXAZOSIN MESYLATE 2 MG PO TABS
4.0000 mg | ORAL_TABLET | Freq: Every day | ORAL | Status: DC
  Administered 2020-11-22 – 2020-11-24 (×3): 4 mg via ORAL
  Filled 2020-11-22: qty 4
  Filled 2020-11-22 (×2): qty 2

## 2020-11-22 NOTE — Progress Notes (Signed)
    BRIEF OVERNIGHT PROGRESS REPORT  Called by RN for a change in mentation (compared to earlier) on awakening from sleep. Patient does pass the Stroke evaluation. He speaks clearly but does exhibit confusion. He can recall events to a limited degree.  He will be be on the way to CT at this time. Results pending. Mov't of extremities x 4 equal  No facial droop No slurring of speech. A&Ox [place] MSNA [id number]-AG Acute Care Nurse Practitioner [place]

## 2020-11-22 NOTE — Progress Notes (Signed)
ANTICOAGULATION CONSULT NOTE  Pharmacy Consult for Heparin Indication:  saddle pulmonary embolus  No Known Allergies  Patient Measurements: Height: 6' (182.9 cm) Weight: 114.5 kg (252 lb 6.8 oz) IBW/kg (Calculated) : 77.6 Heparin Dosing Weight: 102 kg  Vital Signs: Temp: 97.5 F (36.4 C) (07/06 0424) Temp Source: Oral (07/06 0424) BP: 169/60 (07/06 0700) Pulse Rate: 57 (07/06 0700)  Labs: Recent Labs    11/20/20 1608 11/20/20 1616 11/20/20 2145 11/21/20 0409 11/21/20 0500 11/21/20 0802 11/21/20 0907 11/21/20 1457 11/22/20 0240  HGB 14.9  --   --  14.8  --   --  15.3  --  14.4  HCT 45.3  --   --  45.8  --   --  47.1  --  45.4  PLT 118*  --   --  124*  --   --  PLATELET CLUMPS NOTED ON SMEAR, UNABLE TO ESTIMATE  --  121*  APTT  --   --  30  --   --   --   --   --   --   LABPROT  --   --  14.9  --   --   --   --   --   --   INR  --   --  1.2  --   --   --   --   --   --   HEPARINUNFRC  --   --   --   --   --  0.36  --  0.32 0.35  CREATININE 1.05  --   --  1.00  --   --   --   --   --   TROPONINIHS  --    < > 284* 211* 198* 129* 136*  --   --    < > = values in this interval not displayed.   Estimated Creatinine Clearance: 80.9 mL/min (by C-G formula based on SCr of 1 mg/dL).  Medical History: Past Medical History:  Diagnosis Date   Anxiety    At risk for sleep apnea    STOP-BANG= 5     SENT TO PCP 11-29-2013   Cataract    Chronic fatigue    DJD (degenerative joint disease)    RIGHT KNEE   History of colon polyps    2011  &  2013 --  ADENOMATOUS   History of pulmonary embolism    Hyperlipidemia    pt denies ever having high chol   Hypertension    Right knee meniscal tear    Ulcerative colitis    Medications:  No oral anticoagulation PTA  Assessment: 80 yr male with shortness of breath and left shoulder pain PMH significant for HTN, HLD, PE (not currently on anticoagulation) CTAngio = + saddle PE Heparin 3000 unit bolus, infusion begun at 1650  units/hr Baseline aPTT 30 sec, INR 1.2  Goal of Therapy:  Heparin level 0.3-0.7 units/ml Monitor platelets by anticoagulation protocol: Yes  Today, 11/22/2020 0240 Hep level 0.35 units/ml, cont low therapeutic range after rate increase 7/5 Hgb wnl, Plt 163 on admit >> 121   Plan:  Will increase Heparin to 1850 units/hr (to ensure remains in range) Daily CBC and Heparin level Monitor for signs and symptoms of bleeding  Minda Ditto, PharmD 11/22/2020,7:11 AM

## 2020-11-22 NOTE — Telephone Encounter (Signed)
Patient has been scheduled with [facility] on 02/13/21 at 130pm. Epic chat has been sent to [facility] to double on the type of echo order and diagnosis needed for order. Will place the order once I hear back from him.

## 2020-11-22 NOTE — Consult Note (Signed)
NAME:  Bill Fox, MRN:  [id number], DOB:  November 19, 1940, LOS: 2 ADMISSION DATE:  11/20/2020, CONSULTATION DATE:  11/22/20 REFERRING MD:  Grandville Silos, [facility] CHIEF COMPLAINT:  DOE   History of Present Illness:  80 year old man with past medical history of PE, UC with recent flare who presents with progressive dyspnea on exertion and acute hypoxemic respiratory failure in setting of submassive PE.  H&P reviewed.  ED provider note reviewed.  Patient onset of dyspnea on exertion about 2 months ago.  Notes progressive difficulty walking up and down the driveway.  Progressed to the point where he had a hard time going up and down steps last week.  Developed chest pain and worsening shortness of breath yesterday.  Called EMS.  Was transported to [facility].  He notes that he has had recent UC flare treated with a couple courses of prednisone.  Usually 1 course cleared without but had additional treatment per his report.  In the ED, he was found to be hypoxemic to the 70s on room air.  Placed on 3 L.  CTA PE protocol was obtained which on my interpretation reveals extensive clot burden in the main PA, and subsequent branches bilaterally to the segmental level.  Suggestion of right heart strain on CTA.  Placed on heparin.  Oxygen slowly weaned and now on room air.  TTE obtained yesterday on my review reveals RV dysfunction, positive McConnell sign.  Overall, he reports improvement.  Chest pain better.  Dyspnea is improved.  Note he is now off oxygen and feels breathing is stable.  Does endorse some mild diarrhea while he is here he attributes to his UC.  Endorses intermittent mild hematochezia worse recently in the setting of his UC flare.  Pertinent  Medical History  PE in past UC  Significant Hospital Events: Including procedures, antibiotic start and stop dates in addition to other pertinent events   7/4 presented to ED with hypoxemia, severe DOE, PE on CTA 7/5 TTE with RV dysfunction, weaned off O2 7/6  PCCM consult  Interim History / Subjective:  As above  Objective   Blood pressure (!) 168/86, pulse 65, temperature 97.6 F (36.4 C), temperature source Oral, resp. rate 18, height 6' (1.829 m), weight 114.5 kg, SpO2 94 %.        Intake/Output Summary (Last 24 hours) at 11/22/2020 1125 Last data filed at 11/22/2020 1032 Gross per 24 hour  Intake 3850.29 ml  Output 1775 ml  Net 2075.29 ml   Filed Weights   11/20/20 1413 11/21/20 0254  Weight: 114.3 kg 114.5 kg    Examination: General: Well appearing, sitting up in chair Eyes: EOMI, no icterus Neck: No JVP, supple CV: RRR, nu murmurs Pulm CTAB, NWOB on RA Abd: ND, BS present MSK: No synovitis, no joint effusion Neuro: no weakness, sensation intact Psych: Normal mood, full affect   Resolved Hospital Problem list     Assessment & Plan:  Submassive PE: Seems unprovoked, recent UC flare do possible hyperinflammatory state as predisposing factor. 2nd PE, had one many years ago with Pna and relative immobility in hospital. Recommend lifelong AC, discussed with patient. May difficult given UC and intermittent hematochezia. RV dysfunction on TTE. Recommend 3 month f/u TTE to assess for resolution. He is symptomatically much improved on heparin. No role for other interventions at this time. --heparin drip, transition to Eliquis (loading dose then maintenance) this evening --Will arrange OP pulmonary f/u and repeat TTE  Acute Hypoxemic Respiratory Failure: Due to PE.  Resolved on heparin.  PCCM will sign off.  Best Practice (right click and "Reselect all SmartList Selections" daily)   Per primary  Labs   CBC: Recent Labs  Lab 11/17/20 0916 11/20/20 1608 11/21/20 0409 11/21/20 0907 11/22/20 0240  WBC 6.5 9.6 11.5* 11.3* 7.5  NEUTROABS 3.6 8.5*  --   --   --   HGB 15.5 14.9 14.8 15.3 14.4  HCT 46.7 45.3 45.8 47.1 45.4  MCV 86.0 87.5 87.7 86.9 88.8  PLT 163.0 118* 124* PLATELET CLUMPS NOTED ON SMEAR, UNABLE TO ESTIMATE  121*    Basic Metabolic Panel: Recent Labs  Lab 11/17/20 0916 11/20/20 1608 11/21/20 0409 11/22/20 0859 11/22/20 1042  NA 139 135 135 134*  --   K 4.0 4.0 4.1 6.0* 4.2  CL 106 101 102 104  --   CO2 23 22 22 23   --   GLUCOSE 90 134* 127* 116*  --   BUN 16 19 19 23   --   CREATININE 1.07 1.05 1.00 0.90  --   CALCIUM 8.9 8.2* 8.7* 8.3*  --   MG  --   --   --  2.3  --    GFR: Estimated Creatinine Clearance: 89.8 mL/min (by C-G formula based on SCr of 0.9 mg/dL). Recent Labs  Lab 11/20/20 1608 11/21/20 0409 11/21/20 0907 11/22/20 0240  WBC 9.6 11.5* 11.3* 7.5    Liver Function Tests: Recent Labs  Lab 11/17/20 0916 11/20/20 1608  AST 12 25  ALT 17 32  ALKPHOS 53 45  BILITOT 0.3 0.8  PROT 7.6 7.1  ALBUMIN 3.9 3.2*   No results for input(s): LIPASE, AMYLASE in the last 168 hours. No results for input(s): AMMONIA in the last 168 hours.  ABG    Component Value Date/Time   PHART 7.452 (H) 08/04/2008 0200   PCO2ART 33.3 (L) 08/04/2008 0200   PO2ART 75.7 (L) 08/04/2008 0200   HCO3 23.1 08/04/2008 0200   TCO2 20.5 08/04/2008 0200   ACIDBASEDEF 0.1 08/04/2008 0200   O2SAT 95.9 08/04/2008 0200   O2SAT 95.7 08/04/2008 0200     Coagulation Profile: Recent Labs  Lab 11/20/20 2145  INR 1.2    Cardiac Enzymes: No results for input(s): CKTOTAL, CKMB, CKMBINDEX, TROPONINI in the last 168 hours.  HbA1C: No results found for: HGBA1C  CBG: Recent Labs  Lab 11/21/20 0801 11/21/20 1155 11/21/20 1622 11/21/20 2211 11/22/20 0732  GLUCAP 124* 107* 116* 127* 129*    Review of Systems:   No chest pain. No orthopnea or PND, no LE swelling. Comprehensive ROS otherwise negative.   Past Medical History:  He,  has a past medical history of Anxiety, At risk for sleep apnea, Cataract, Chronic fatigue, DJD (degenerative joint disease), History of colon polyps, History of pulmonary embolism, Hyperlipidemia, Hypertension, Right knee meniscal tear, and Ulcerative colitis.    Surgical History:   Past Surgical History:  Procedure Laterality Date   CATARACT EXTRACTION W/ INTRAOCULAR LENS IMPLANT Left 2014   COLONOSCOPY W/ POLYPECTOMY  MULTIPLE--  LAST ONE 09-12-2011   KNEE ARTHROSCOPY WITH DRILLING/MICROFRACTURE Right 12/03/2013   Procedure: RIGHT KNEE ARTHROSCOPY PARTIAL MEDIAL MENISCECTOMY DEBRIDEMENT SYNOVECTOMY;  Surgeon: Johnn Hai, MD;  Location: [place] [facility];  Service: Orthopedics;  Laterality: Right;   NASAL FRACTURE SURGERY  1985   TRANSTHORACIC ECHOCARDIOGRAM  11-01-2008   MILD LVH/  EF 77%/  GRADE I DIASTOLIC DYSFUNCTION/  MILD LAE     Social History:   reports that he quit smoking  about 27 years ago. His smoking use included cigarettes. He has a 30.00 pack-year smoking history. He has never used smokeless tobacco. He reports current alcohol use of about 1.0 standard drink of alcohol per week. He reports that he does not use drugs.   Family History:  His family history includes Diabetes in his mother and paternal uncle; Lung cancer in his brother; Stomach cancer in his father. There is no history of Colon cancer, Pancreatic cancer, Esophageal cancer, or Colon polyps.   Allergies No Known Allergies   Home Medications  Prior to Admission medications   Medication Sig Start Date End Date Taking? Authorizing Provider  doxazosin (CARDURA) 8 MG tablet TAKE ONE-HALF (1/2) TABLET AT BEDTIME Patient taking differently: Take 4 mg by mouth at bedtime. 08/14/20  Yes [name], DO  mesalamine (APRISO) 0.375 g 24 hr capsule Take 4 capsules (1.5 g total) by mouth daily. 10/02/20  Yes Milus Banister, MD  mirtazapine (REMERON) 30 MG tablet Take 1 tablet (30 mg total) by mouth at bedtime. 05/08/20  Yes Wendling, Crosby Oyster, DO  predniSONE (DELTASONE) 10 MG tablet Take 4 tablets (40 mg total) by mouth daily with breakfast. Take 61m daily for 1 week, then 381mdaily for 1 week, then 2062maily for 1 week, then 29m68mily for 1  week, then Stop 11/16/20  Yes PerrIrene Shipper     Critical care time: n/a

## 2020-11-22 NOTE — Progress Notes (Addendum)
PROGRESS NOTE    Bill Fox  WCH:[id number] DOB: [birth year] DOA: 11/20/2020 PCP: [name], DO   Chief Complaint  Patient presents with   Shortness of Breath    Brief Narrative:  Patient is a 80 year old male with history of hypertension, DM, ulcerative colitis, history of PE 12 years ago (does not remember if he was on [facility]), not on current anticoagulation presented for dyspnea and left shoulder pain.  Patient was having worsening dyspnea over the past few weeks however woke up on the morning of admission with increased shortness of breath, occurring at rest and with ambulation and exertion.  He also noticed some pain in the left shoulder blade region with increasing shortness of breath.  No fevers or chills.  No coughing.  O2 sats 75% on room air when checked by the paramedics at [facility] and his heart rate was in 150s, so EMS was called.  Patient was brought to ED on a nonrebreather, given nebulizer breathing treatment, Solu-Medrol.  In ED he was weaned to nasal cannula and felt better.  Patient has history of ulcerative colitis and has been on 5 mg daily for the last few weeks per his GI, Dr. Henrene Pastor. D-dimer> 20, initial troponin 205, increased to 284, no acute ischemia on EKG CT angiogram chest showed extensive pulmonary embolism causing right heart strain.  COVID-19 negative. Patient was admitted to ICU PCCM was consulted by EDP however recommended staying on heparin and if decompensates clinically will need repeat tPA and they will consult. -Due to abnormal 2D echo, large saddle embolus, DVT, will consult formally with PCCM for further evaluation and management   Assessment & Plan:   Principal Problem:   Bilateral pulmonary embolism ([place]) Active Problems:   Morbid obesity ([place])   Essential hypertension   Cor pulmonale, acute (HCC)   Thrombocytopenia (HCC)   NSTEMI (non-ST elevated myocardial infarction) ([place])   Ulcerative colitis ([place])   DVT (deep venous  thrombosis) ([place]): Age indeterminant left femoral vein   #1 acute extensive bilateral PE with right heart strain/large saddle embolus/age-indeterminate left femoral vein DVT/acute respiratory failure with hypoxia -Patient presenting with worsening shortness of breath at rest and on exertion. -CT angiogram chest showed large saddle embolus with extension into the lobar pulmonary arteries and most segmental arteries, right heart strain submassive PE.  Patchy infiltration of atelectasis in the posterior lungs, cholelithiasis. -2D echo done with right ventricular systolic function mildly reduced, basal function is preserved but McConnell sign noted.  Left ventricular EF 50 to 35%,TIRW, grade 1 diastolic dysfunction. -Lower extremity Dopplers with age-indeterminate left femoral vein DVT. -Currently with sats of 92 to 95% on 2 L nasal cannula.  Systolic blood pressures in the 160s. -Continue IV heparin for at least 72 hours prior to transitioning to oral anticoagulation. -Due to severity and extensive saddle PE with right ventricular strain, abnormal 2D echo, elevated troponin we will consult with PCCM for further evaluation and management.  2.  Demand ischemia/elevated troponin/NSTEMI -Felt likely secondary to problem #1, acute large saddle PE. -BNP 42.9. -2D echo with left ventricular EF of 50 to 43%,XVQM, grade 1 diastolic dysfunction, right ventricular systolic function mildly reduced, McConnell sign noted. -Continue IV heparin. -Follow.  3.  Hypertension -BP 169/60. -Resume home regimen Cardura. -Due to concern for problem #1 we will monitor blood pressure for now and if continually elevated may start low-dose Norvasc versus beta-blocker.  4.  History of ulcerative colitis -Continue current home regimen of prednisone 40 mg daily  as per GI recommendations with outpatient taper. -Continue Pentasa.  5.  Thrombocytopenia -Patient with no overt bleeding. -Platelet count at 124. -Monitor on  heparin.  6.  Obesity -Estimated BMI 34.24 kg per metered squared.  7.  Hyperkalemia -Repeat potassium level.   DVT prophylaxis: Heparin Code Status: Full Family Communication: Updated patient.  No family at bedside. Disposition:   Status is: Inpatient  Remains inpatient appropriate because:IV treatments appropriate due to intensity of illness or inability to take PO  Dispo: The patient is from: Home              Anticipated d/c is to: Home              Patient currently is not medically stable to d/c.   Difficult to place patient No       Consultants:  PCCM pending  Procedures:  CT head 11/22/2020 CT angiogram chest 11/20/2020 Chest x-ray 11/20/2020 2D echo 11/21/2020 Lower extremity Doppler 11/21/2020   Antimicrobials:  None   Subjective: Sitting up in bed states he feels significantly better than on admission.  Feels shortness of breath has improved.  No chest pain.  No abdominal pain.  No bleeding.  Objective: Vitals:   11/22/20 0424 11/22/20 0600 11/22/20 0700 11/22/20 0725  BP:  (!) 142/60 (!) 169/60   Pulse:  (!) 49 (!) 57   Resp:  (!) 24 (!) 21   Temp: (!) 97.5 F (36.4 C)   97.6 F (36.4 C)  TempSrc: Oral   Oral  SpO2:  96% 93%   Weight:      Height:        Intake/Output Summary (Last 24 hours) at 11/22/2020 1023 Last data filed at 11/22/2020 0909 Gross per 24 hour  Intake 3850.29 ml  Output 1575 ml  Net 2275.29 ml   Filed Weights   11/20/20 1413 11/21/20 0254  Weight: 114.3 kg 114.5 kg    Examination:  General exam: Appears calm and comfortable  Respiratory system: Some coarse breath sounds in the bases otherwise clear.  No wheezing.  No rhonchi.  No crackles.  Speaking in full sentences.  Normal respiratory effort.   Cardiovascular system: S1 & S2 heard, RRR. No JVD, murmurs, rubs, gallops or clicks. No pedal edema. Gastrointestinal system: Abdomen is nondistended, soft and nontender. No organomegaly or masses felt. Normal bowel sounds  heard. Central nervous system: Alert and oriented. No focal neurological deficits. Extremities: Symmetric 5 x 5 power. Skin: No rashes, lesions or ulcers Psychiatry: Judgement and insight appear normal. Mood & affect appropriate.     Data Reviewed: I have personally reviewed following labs and imaging studies  CBC: Recent Labs  Lab 11/17/20 0916 11/20/20 1608 11/21/20 0409 11/21/20 0907 11/22/20 0240  WBC 6.5 9.6 11.5* 11.3* 7.5  NEUTROABS 3.6 8.5*  --   --   --   HGB 15.5 14.9 14.8 15.3 14.4  HCT 46.7 45.3 45.8 47.1 45.4  MCV 86.0 87.5 87.7 86.9 88.8  PLT 163.0 118* 124* PLATELET CLUMPS NOTED ON SMEAR, UNABLE TO ESTIMATE 121*    Basic Metabolic Panel: Recent Labs  Lab 11/17/20 0916 11/20/20 1608 11/21/20 0409 11/22/20 0859  NA 139 135 135 134*  K 4.0 4.0 4.1 6.0*  CL 106 101 102 104  CO2 23 22 22 23   GLUCOSE 90 134* 127* 116*  BUN 16 19 19 23   CREATININE 1.07 1.05 1.00 0.90  CALCIUM 8.9 8.2* 8.7* 8.3*  MG  --   --   --  2.3    GFR: Estimated Creatinine Clearance: 89.8 mL/min (by C-G formula based on SCr of 0.9 mg/dL).  Liver Function Tests: Recent Labs  Lab 11/17/20 0916 11/20/20 1608  AST 12 25  ALT 17 32  ALKPHOS 53 45  BILITOT 0.3 0.8  PROT 7.6 7.1  ALBUMIN 3.9 3.2*    CBG: Recent Labs  Lab 11/21/20 0801 11/21/20 1155 11/21/20 1622 11/21/20 2211 11/22/20 0732  GLUCAP 124* 107* 116* 127* 129*     Recent Results (from the past 240 hour(s))  Resp Panel by RT-PCR (Flu A&B, Covid) Nasopharyngeal Swab     Status: None   Collection Time: 11/20/20  4:08 PM   Specimen: Nasopharyngeal Swab; Nasopharyngeal(NP) swabs in vial transport medium  Result Value Ref Range Status   SARS Coronavirus 2 by RT PCR NEGATIVE NEGATIVE Final    Comment: (NOTE) SARS-CoV-2 target nucleic acids are NOT DETECTED.  The SARS-CoV-2 RNA is generally detectable in upper respiratory specimens during the acute phase of infection. The lowest concentration of SARS-CoV-2  viral copies this assay can detect is 138 copies/mL. A negative result does not preclude SARS-Cov-2 infection and should not be used as the sole basis for treatment or other patient management decisions. A negative result may occur with  improper specimen collection/handling, submission of specimen other than nasopharyngeal swab, presence of viral mutation(s) within the areas targeted by this assay, and inadequate number of viral copies(<138 copies/mL). A negative result must be combined with clinical observations, patient history, and epidemiological information. The expected result is Negative.  Fact Sheet for Patients:  EntrepreneurPulse.com.au  Fact Sheet for Healthcare Providers:  IncredibleEmployment.be  This test is no t yet approved or cleared by the [place] FDA and  has been authorized for detection and/or diagnosis of SARS-CoV-2 by FDA under an Emergency Use Authorization (EUA). This EUA will remain  in effect (meaning this test can be used) for the duration of the COVID-19 declaration under Section 564(b)(1) of the Act, 21 U.S.C.section 360bbb-3(b)(1), unless the authorization is terminated  or revoked sooner.       Influenza A by PCR NEGATIVE NEGATIVE Final   Influenza B by PCR NEGATIVE NEGATIVE Final    Comment: (NOTE) The Xpert Xpress SARS-CoV-2/FLU/RSV plus assay is intended as an aid in the diagnosis of influenza from Nasopharyngeal swab specimens and should not be used as a sole basis for treatment. Nasal washings and aspirates are unacceptable for Xpert Xpress SARS-CoV-2/FLU/RSV testing.  Fact Sheet for Patients: EntrepreneurPulse.com.au  Fact Sheet for Healthcare Providers: IncredibleEmployment.be  This test is not yet approved or cleared by the [place] FDA and has been authorized for detection and/or diagnosis of SARS-CoV-2 by FDA under an Emergency Use Authorization  (EUA). This EUA will remain in effect (meaning this test can be used) for the duration of the COVID-19 declaration under Section 564(b)(1) of the Act, 21 U.S.C. section 360bbb-3(b)(1), unless the authorization is terminated or revoked.  Performed at [facility], [place] [address] [place], [place] [address]   MRSA Next Gen by PCR, Nasal     Status: None   Collection Time: 11/21/20  3:01 AM   Specimen: Nasal Mucosa; Nasal Swab  Result Value Ref Range Status   MRSA by PCR Next Gen NOT DETECTED NOT DETECTED Final    Comment: (NOTE) The GeneXpert MRSA Assay (FDA approved for NASAL specimens only), is one component of a comprehensive MRSA colonization surveillance program. It is not intended to diagnose MRSA infection nor to guide  or monitor treatment for MRSA infections. Test performance is not FDA approved in patients less than 36 years old. Performed at [facility], [place] [address] [place], [place] [address]          Radiology Studies: CT HEAD WO CONTRAST  Result Date: 11/22/2020 CLINICAL DATA:  Sudden onset confusion. EXAM: CT HEAD WITHOUT CONTRAST TECHNIQUE: Contiguous axial images were obtained from the base of the skull through the vertex without intravenous contrast. COMPARISON:  12/12/2017 FINDINGS: Brain: Mild diffuse cerebral atrophy. Ventricular dilatation consistent with central atrophy. Low-attenuation changes in the deep white matter consistent with small vessel ischemia. Old lacunar infarcts in the basal ganglia. No mass effect or midline shift. No abnormal extra-axial fluid collections. Gray-white matter junctions are distinct. Basal cisterns are not effaced. No acute intracranial hemorrhage. Vascular: Moderate intracranial arterial vascular calcifications. Skull: Calvarium appears intact. Sinuses/Orbits: Mucosal thickening in the maxillary antra. No acute air-fluid levels. Mastoid air cells are clear. Other: No significant changes since  the previous study. Mild interval progression of atrophic changes. IMPRESSION: No acute intracranial abnormalities. Chronic atrophy and small vessel ischemic change. Electronically Signed   By: Lucienne Capers M.D.   On: 11/22/2020 03:52   CT Angio Chest PE W/Cm &/Or Wo Cm  Result Date: 11/20/2020 CLINICAL DATA:  Shortness of breath for 3 weeks. History of blood clots. Pulmonary embolus suspected with high probability. EXAM: CT ANGIOGRAPHY CHEST WITH CONTRAST TECHNIQUE: Multidetector CT imaging of the chest was performed using the standard protocol during bolus administration of intravenous contrast. Multiplanar CT image reconstructions and MIPs were obtained to evaluate the vascular anatomy. CONTRAST:  72m OMNIPAQUE IOHEXOL 350 MG/ML SOLN COMPARISON:  Chest radiograph 11/20/2020.  CT chest 02/06/2015 FINDINGS: Cardiovascular: Good opacification of the central and segmental pulmonary arteries. Multiple filling defects demonstrated in the main, lobar, and nearly all proximal segmental pulmonary arteries. Changes are consistent with acute pulmonary embolus with large clot burden. RV to LV ratio is elevated at 1.7 suggesting evidence of right heart strain. No pericardial effusion. Normal caliber thoracic aorta. No aortic dissection. Mediastinum/Nodes: Calcified lymph nodes in the mediastinum. No significant lymphadenopathy. Esophagus is decompressed. Thyroid gland is unremarkable. Lungs/Pleura: Motion artifact limits examination. Patchy peripheral infiltration or atelectasis. Airways are patent. No pleural effusions. No pneumothorax. Upper Abdomen: Cholelithiasis. No evidence of cholecystitis. Calcified granulomas in the spleen. Musculoskeletal: Degenerative changes in the spine. Review of the MIP images confirms the above findings. IMPRESSION: 1. Positive for acute pulmonary embolus with large saddle embolus and extension into lobar pulmonary arteries and most segmental arteries. Positive for acute PE with CT  evidence of right heart strain (RV/LV Ratio = 1.7) consistent with at least submassive (intermediate risk) PE. The presence of right heart strain has been associated with an increased risk of morbidity and mortality. Please refer to the "PE Focused" order set in EPIC. 2. Patchy infiltration or atelectasis in the posterior lungs. 3. Cholelithiasis. Critical Value/emergent results were called by telephone at the time of interpretation on 11/20/2020 at 11:00 pm to provider [facility], who verbally acknowledged these results. Electronically Signed   By: WLucienne CapersM.D.   On: 11/20/2020 23:06   DG Chest Port 1 View  Result Date: 11/20/2020 CLINICAL DATA:  Shoulder blade pain. EXAM: PORTABLE CHEST 1 VIEW COMPARISON:  01/02/2017 FINDINGS: Single view of the chest demonstrates mild haziness in the left lower chest which could be related to atelectasis. Otherwise, the lungs are clear. Slightly low lung volumes. Heart size is within normal  limits. Trachea is midline. Negative for a pneumothorax. Degenerative changes at both [facility] joints. IMPRESSION: Low lung volumes with probable atelectasis in the left lower chest. Electronically Signed   By: Markus Daft M.D.   On: 11/20/2020 15:36   ECHOCARDIOGRAM COMPLETE  Result Date: 11/21/2020    ECHOCARDIOGRAM REPORT   Patient Name:   Bill Fox Date of Exam: 11/21/2020 Medical Rec #:  [id number]        Height:       72.0 in Accession #:    [id number]       Weight:       252.4 lb Date of Birth:  [birth year]        BSA:          2.352 m Patient Age:    26 years         BP:           118/67 mmHg Patient Gender: M                HR:           59 bpm. Exam Location:  Inpatient Procedure: 2D Echo, Cardiac Doppler and Color Doppler Indications:    Pulmonary embolus  History:        Patient has no prior history of Echocardiogram examinations.                 Signs/Symptoms:Dyspnea and Pul.embolus; Risk                 Factors:Hypertension, Diabetes, Dyslipidemia, Former Smoker  and                 Morbid obesity.  Sonographer:    Dustin Flock Referring Phys: [id number] Eben Burow  Sonographer Comments: Patient is morbidly obese. Image acquisition challenging due to respiratory motion and Image acquisition challenging due to patient body habitus. IMPRESSIONS  1. Right ventricular systolic function is mildly reduced-basal function is preserved but     McConnell's Sign is noted. This can be seen in pulmonary embolism and other forms of right heart strain. The right ventricular size is normal.  2. Left ventricular ejection fraction, by estimation, is 50 to 55%. The left ventricle has low normal function. The left ventricle has no regional wall motion abnormalities. Left ventricular diastolic parameters are consistent with Grade I diastolic dysfunction (impaired relaxation).  3. The mitral valve is grossly normal. No evidence of mitral valve regurgitation. No evidence of mitral stenosis. Moderate mitral annular calcification.  4. The aortic valve was not well visualized. There is mild calcification of the aortic valve. There is mild thickening of the aortic valve. Aortic valve regurgitation is not visualized. No aortic stenosis is present. Comparison(s): No prior Echocardiogram. FINDINGS  Left Ventricle: Left ventricular ejection fraction, by estimation, is 50 to 55%. The left ventricle has low normal function. The left ventricle has no regional wall motion abnormalities. The left ventricular internal cavity size was normal in size. There is no left ventricular hypertrophy. Left ventricular diastolic parameters are consistent with Grade I diastolic dysfunction (impaired relaxation). Right Ventricle: The right ventricular size is normal. No increase in right ventricular wall thickness. Right ventricular systolic function is mildly reduced. Left Atrium: Left atrial size was normal in size. Right Atrium: Right atrial size was normal in size. Pericardium: There is no evidence of  pericardial effusion. Mitral Valve: The mitral valve is grossly normal. Moderate mitral annular calcification. No evidence of mitral valve regurgitation. No evidence of mitral valve  stenosis. Tricuspid Valve: The tricuspid valve is normal in structure. Tricuspid valve regurgitation is trivial. No evidence of tricuspid stenosis. Aortic Valve: The aortic valve was not well visualized. There is mild calcification of the aortic valve. There is mild thickening of the aortic valve. Aortic valve regurgitation is not visualized. No aortic stenosis is present. Pulmonic Valve: The pulmonic valve was not well visualized. Pulmonic valve regurgitation is not visualized. No evidence of pulmonic stenosis. Aorta: The aortic root is normal in size and structure. IAS/Shunts: The atrial septum is grossly normal.  LEFT VENTRICLE PLAX 2D LVIDd:         5.10 cm Diastology LVIDs:         3.10 cm LV e' medial:    7.18 cm/s LV PW:         1.10 cm LV E/e' medial:  8.8 LV IVS:        1.00 cm LV e' lateral:   6.53 cm/s                        LV E/e' lateral: 9.7  RIGHT VENTRICLE RV Basal diam:  3.80 cm RV S prime:     5.66 cm/s TAPSE (M-mode): 2.3 cm LEFT ATRIUM             Index       RIGHT ATRIUM           Index LA diam:        3.70 cm 1.57 cm/m  RA Area:     16.70 cm LA Vol (A2C):   28.8 ml 12.24 ml/m RA Volume:   42.80 ml  18.20 ml/m LA Vol (A4C):   41.8 ml 17.77 ml/m LA Biplane Vol: 37.9 ml 16.11 ml/m  AORTIC VALVE LVOT Vmax:   96.60 cm/s LVOT Vmean:  67.500 cm/s LVOT VTI:    0.213 m  AORTA Ao Root diam: 3.20 cm MITRAL VALVE MV Area (PHT): 3.00 cm    SHUNTS MV Decel Time: 253 msec    Systemic VTI: 0.21 m MV E velocity: 63.40 cm/s MV A velocity: 74.10 cm/s MV E/A ratio:  0.86 Rudean Haskell MD Electronically signed by Rudean Haskell MD Signature Date/Time: 11/21/2020/10:59:18 AM    Final    VAS [place] LOWER EXTREMITY VENOUS (DVT)  Result Date: 11/21/2020  Lower Venous DVT Study Patient Name:  Bill Fox  Date of Exam:    11/21/2020 Medical Rec #: [id number]         Accession #:    [id number] Date of Birth: Sep 28, 1940         Patient Gender: M Patient Age:   79Y Exam Location:  [facility] Procedure:      VAS [place] LOWER EXTREMITY VENOUS (DVT) Referring Phys: [id number] RIPUDEEP K RAI --------------------------------------------------------------------------------  Indications: Pulmonary embolism.  Comparison Study: EMR show previous exam on 06/11/13 but unable to see results. Performing Technologist: Rogelia Rohrer RVT, RDMS  Examination Guidelines: A complete evaluation includes B-mode imaging, spectral Doppler, color Doppler, and power Doppler as needed of all accessible portions of each vessel. Bilateral testing is considered an integral part of a complete examination. Limited examinations for reoccurring indications may be performed as noted. The reflux portion of the exam is performed with the patient in reverse Trendelenburg.  +---------+---------------+---------+-----------+----------+--------------+ RIGHT    CompressibilityPhasicitySpontaneityPropertiesThrombus Aging +---------+---------------+---------+-----------+----------+--------------+ CFV      Full           Yes      Yes                                 +---------+---------------+---------+-----------+----------+--------------+  SFJ      Full                                                        +---------+---------------+---------+-----------+----------+--------------+ FV Prox  Full           Yes      Yes                                 +---------+---------------+---------+-----------+----------+--------------+ FV Mid   Full           Yes      Yes                                 +---------+---------------+---------+-----------+----------+--------------+ FV DistalFull           Yes      Yes                                 +---------+---------------+---------+-----------+----------+--------------+ PFV      Full                                                         +---------+---------------+---------+-----------+----------+--------------+ POP      Full           Yes      Yes                                 +---------+---------------+---------+-----------+----------+--------------+ PTV      Full                                                        +---------+---------------+---------+-----------+----------+--------------+ PERO     Full                                                        +---------+---------------+---------+-----------+----------+--------------+   +---------+---------------+---------+-----------+----------+-----------------+ LEFT     CompressibilityPhasicitySpontaneityPropertiesThrombus Aging    +---------+---------------+---------+-----------+----------+-----------------+ CFV      Full           Yes      Yes                                    +---------+---------------+---------+-----------+----------+-----------------+ SFJ      Full                                                           +---------+---------------+---------+-----------+----------+-----------------+  FV Prox  Partial        Yes      Yes                  Age Indeterminate +---------+---------------+---------+-----------+----------+-----------------+ FV Mid   Partial        Yes      Yes                  Age Indeterminate +---------+---------------+---------+-----------+----------+-----------------+ FV DistalFull           Yes      Yes                                    +---------+---------------+---------+-----------+----------+-----------------+ PFV      Full                                                           +---------+---------------+---------+-----------+----------+-----------------+ POP      Full           Yes      Yes                                    +---------+---------------+---------+-----------+----------+-----------------+ PTV      Full                                                            +---------+---------------+---------+-----------+----------+-----------------+ PERO     Full                                                           +---------+---------------+---------+-----------+----------+-----------------+     Summary: BILATERAL: -No evidence of popliteal cyst, bilaterally. RIGHT: - There is no evidence of deep vein thrombosis in the lower extremity. - There is no evidence of superficial venous thrombosis.  LEFT: - Findings consistent with age indeterminate deep vein thrombosis involving the left femoral vein. - There is no evidence of superficial venous thrombosis.  *See table(s) above for measurements and observations. Electronically signed by Ruta Hinds MD on 11/21/2020 at 3:47:39 PM.    Final         Scheduled Meds:  Chlorhexidine Gluconate Cloth  6 each Topical Q0600   doxazosin  4 mg Oral QHS   mouth rinse  15 mL Mouth Rinse BID   mesalamine  1,500 mg Oral Daily   mirtazapine  30 mg Oral QHS   predniSONE  10 mg Oral Q6H   Continuous Infusions:  heparin 1,850 Units/hr (11/22/20 0909)     LOS: 2 days    Time spent: 40 minutes    Irine Seal, MD [facility]   To contact the attending provider between 7A-7P or the covering provider during after hours 7P-7A, please log into the web site [email or web address] and access using universal [place] password for that web  site. If you do not have the password, please call the hospital operator.  11/22/2020, 10:23 AM

## 2020-11-22 NOTE — Telephone Encounter (Signed)
-----   Message from Lanier Clam, MD sent at 11/22/2020 11:52 AM EDT ----- Regarding: hosp f/u Good afternoon!  Can he be scheduled hosp f/u 30 min with me in late September? Needs order for echocardiogram to be scheduled a day or two prior to visit with me. I would place but lately orders I place in hospital are not hitting [facility] pool work que.   Thank you!

## 2020-11-23 LAB — GLUCOSE, CAPILLARY
Glucose-Capillary: 115 mg/dL — ABNORMAL HIGH (ref 70–99)
Glucose-Capillary: 156 mg/dL — ABNORMAL HIGH (ref 70–99)
Glucose-Capillary: 116 mg/dL — ABNORMAL HIGH (ref 70–99)
Glucose-Capillary: 106 mg/dL — ABNORMAL HIGH (ref 70–99)

## 2020-11-23 LAB — BASIC METABOLIC PANEL
Anion gap: 8 (ref 5–15)
BUN: 23 mg/dL (ref 8–23)
CO2: 22 mmol/L (ref 22–32)
Calcium: 8.2 mg/dL — ABNORMAL LOW (ref 8.9–10.3)
Chloride: 106 mmol/L (ref 98–111)
Creatinine, Ser: 1.1 mg/dL (ref 0.61–1.24)
GFR, Estimated: >60 mL/min (ref >60)
Glucose, Bld: 127 mg/dL — ABNORMAL HIGH (ref 70–99)
Potassium: 4.9 mmol/L (ref 3.5–5.1)
Sodium: 136 mmol/L (ref 135–145)

## 2020-11-23 LAB — CBC
HCT: 44.9 % (ref 39.0–52.0)
Hemoglobin: 14.3 g/dL (ref 13.0–17.0)
MCH: 28 pg (ref 26.0–34.0)
MCHC: 31.8 g/dL (ref 30.0–36.0)
MCV: 88 fL (ref 80.0–100.0)
Platelets: 131 10*3/uL — ABNORMAL LOW (ref 150–400)
RBC: 5.1 MIL/uL (ref 4.22–5.81)
RDW: 14.2 % (ref 11.5–15.5)
WBC: 6.7 10*3/uL (ref 4.0–10.5)
nRBC: 0 % (ref 0.0–0.2)

## 2020-11-23 LAB — HEPARIN LEVEL (UNFRACTIONATED): Heparin Unfractionated: 0.47 IU/mL (ref 0.30–0.70)

## 2020-11-23 MED ORDER — APIXABAN 5 MG PO TABS
10.0000 mg | ORAL_TABLET | Freq: Two times a day (BID) | ORAL | Status: DC
  Administered 2020-11-23 – 2020-11-25 (×5): 10 mg via ORAL
  Filled 2020-11-23 (×5): qty 2

## 2020-11-23 MED ORDER — AMLODIPINE BESYLATE 5 MG PO TABS
5.0000 mg | ORAL_TABLET | Freq: Every day | ORAL | Status: DC
  Administered 2020-11-23 – 2020-11-24 (×2): 5 mg via ORAL
  Filled 2020-11-23 (×2): qty 1

## 2020-11-23 MED ORDER — APIXABAN 5 MG PO TABS: 5.0000 mg | ORAL_TABLET | Freq: Two times a day (BID) | ORAL | Status: DC

## 2020-11-23 NOTE — Discharge Instructions (Signed)
Information on my medicine - ELIQUIS (apixaban)  This medication education was reviewed with me or my healthcare representative as part of my discharge preparation.  The pharmacist that spoke with me during my hospital stay was:  Minda Ditto, [facility]  Why was Eliquis prescribed for you? Eliquis was prescribed to treat blood clots that may have been found in the veins of your legs (deep vein thrombosis) or in your lungs (pulmonary embolism) and to reduce the risk of them occurring again.  What do You need to know about Eliquis ? The starting dose is 10 mg (two 5 mg tablets) taken TWICE daily for the FIRST SEVEN (7) DAYS, then on (enter date)  11/30/2020  the dose is reduced to ONE 5 mg tablet taken TWICE daily.  Eliquis may be taken with or without food.   Try to take the dose about the same time in the morning and in the evening. If you have difficulty swallowing the tablet whole please discuss with your pharmacist how to take the medication safely.  Take Eliquis exactly as prescribed and DO NOT stop taking Eliquis without talking to the doctor who prescribed the medication.  Stopping may increase your risk of developing a new blood clot.  Refill your prescription before you run out.  After discharge, you should have regular check-up appointments with your healthcare provider that is prescribing your Eliquis.    What do you do if you miss a dose? If a dose of ELIQUIS is not taken at the scheduled time, take it as soon as possible on the same day and twice-daily administration should be resumed. The dose should not be doubled to make up for a missed dose.  Important Safety Information A possible side effect of Eliquis is bleeding. You should call your healthcare provider right away if you experience any of the following: Bleeding from an injury or your nose that does not stop. Unusual colored urine (red or dark brown) or unusual colored stools (red or black). Unusual bruising for  unknown reasons. A serious fall or if you hit your head (even if there is no bleeding).  Some medicines may interact with Eliquis and might increase your risk of bleeding or clotting while on Eliquis. To help avoid this, consult your healthcare provider or pharmacist prior to using any new prescription or non-prescription medications, including herbals, vitamins, non-steroidal anti-inflammatory drugs (NSAIDs) and supplements.  This website has more information on Eliquis (apixaban): http://[email or web address].com/eliquis/home

## 2020-11-23 NOTE — Progress Notes (Signed)
[place] for Apixaban - start 7/7 am Indication:  saddle pulmonary embolus  No Known Allergies  Patient Measurements: Height: 6' (182.9 cm) Weight: 114.5 kg (252 lb 6.8 oz) IBW/kg (Calculated) : 77.6 Heparin Dosing Weight: 102 kg  Vital Signs: Temp: 97.6 F (36.4 C) (07/07 0342) Temp Source: Oral (07/07 0342) BP: 169/83 (07/07 0606) Pulse Rate: 54 (07/07 0600)  Labs: Recent Labs    11/20/20 2145 11/21/20 0409 11/21/20 0409 11/21/20 0500 11/21/20 0802 11/21/20 0907 11/21/20 1457 11/22/20 0240 11/22/20 0859 11/23/20 0304  HGB  --  14.8  --   --   --  15.3  --  14.4  --  14.3  HCT  --  45.8  --   --   --  47.1  --  45.4  --  44.9  PLT  --  124*  --   --   --  PLATELET CLUMPS NOTED ON SMEAR, UNABLE TO ESTIMATE  --  121*  --  131*  APTT 30  --   --   --   --   --   --   --   --   --   LABPROT 14.9  --   --   --   --   --   --   --   --   --   INR 1.2  --   --   --   --   --   --   --   --   --   HEPARINUNFRC  --   --    < >  --  0.36  --  0.32 0.35  --  0.47  CREATININE  --  1.00  --   --   --   --   --   --  0.90 1.10  TROPONINIHS 284* 211*  --  198* 129* 136*  --   --   --   --    < > = values in this interval not displayed.   Estimated Creatinine Clearance: 73.5 mL/min (by C-G formula based on SCr of 1.1 mg/dL).  Medical History: Past Medical History:  Diagnosis Date   Anxiety    At risk for sleep apnea    STOP-BANG= 5     SENT TO PCP 11-29-2013   Cataract    Chronic fatigue    DJD (degenerative joint disease)    RIGHT KNEE   History of colon polyps    2011  &  2013 --  ADENOMATOUS   History of pulmonary embolism    Hyperlipidemia    pt denies ever having high chol   Hypertension    Right knee meniscal tear    Ulcerative colitis    Medications:  No oral anticoagulation PTA  Assessment: 80 yr male with shortness of breath and left shoulder pain PMH significant for HTN, HLD, PE (not currently on  anticoagulation) CTAngio = + saddle PE Heparin 3000 unit bolus, infusion begun at 1650 units/hr Baseline aPTT 30 sec, INR 1.2  Goal of Therapy:  Heparin level 0.3-0.7 units/ml Monitor platelets by anticoagulation protocol: Yes  Today, 11/23/2020 0300 Hep level 0.47 units/ml Hgb wnl, Plt 163 on admit >> 131 Start Apixaban 7/7 am, discontinue Heparin at same time   Plan:  Discontinue Heparin infusion at 10am Begin Apixaban 42m bid x 7 days, followed by 564mbid Apixaban education  GrMinda DittoPharmD 11/23/2020,7:06 AM

## 2020-11-23 NOTE — Progress Notes (Signed)
PROGRESS NOTE    Bill Fox  DJM:[id number] DOB: [birth year] DOA: 11/20/2020 PCP: [name], DO   Chief Complaint  Patient presents with   Shortness of Breath    Brief Narrative:  Patient is a 79 year old male with history of hypertension, DM, ulcerative colitis, history of PE 12 years ago (does not remember if he was on [facility]), not on current anticoagulation presented for dyspnea and left shoulder pain.  Patient was having worsening dyspnea over the past few weeks however woke up on the morning of admission with increased shortness of breath, occurring at rest and with ambulation and exertion.  He also noticed some pain in the left shoulder blade region with increasing shortness of breath.  No fevers or chills.  No coughing.  O2 sats 75% on room air when checked by the paramedics at [facility] and his heart rate was in 150s, so EMS was called.  Patient was brought to ED on a nonrebreather, given nebulizer breathing treatment, Solu-Medrol.  In ED he was weaned to nasal cannula and felt better.  Patient has history of ulcerative colitis and has been on 5 mg daily for the last few weeks per his GI, Dr. Henrene Pastor. D-dimer> 20, initial troponin 205, increased to 284, no acute ischemia on EKG CT angiogram chest showed extensive pulmonary embolism causing right heart strain.  COVID-19 negative. Patient was admitted to ICU PCCM was consulted by EDP however recommended staying on heparin and if decompensates clinically will need repeat tPA and they will consult. -Due to abnormal 2D echo, large saddle embolus, DVT, will consult formally with PCCM for further evaluation and management   Assessment & Plan:   Principal Problem:   Bilateral pulmonary embolism ([place]) Active Problems:   Morbid obesity ([place])   Essential hypertension   Cor pulmonale, acute (HCC)   Thrombocytopenia (HCC)   NSTEMI (non-ST elevated myocardial infarction) ([place])   Ulcerative colitis ([place])   DVT (deep venous  thrombosis) [facility]): Age indeterminant left femoral vein   Acute saddle pulmonary embolism (HCC)   1 acute extensive bilateral PE with right heart strain/large saddle embolus/age-indeterminate left femoral vein DVT/acute respiratory failure with hypoxia -Patient presenting with worsening shortness of breath at rest and on exertion. -CT angiogram chest showed large saddle embolus with extension into the lobar pulmonary arteries and most segmental arteries, right heart strain submassive PE.  Patchy infiltration of atelectasis in the posterior lungs, cholelithiasis. -2D echo done with right ventricular systolic function mildly reduced, basal function is preserved but McConnell sign noted.  Left ventricular EF 50 to 22%,WLNL, grade 1 diastolic dysfunction. -Lower extremity Dopplers with age-indeterminate left femoral vein DVT. -Patient with sats of 93 to 96% on room air currently.   -Systolic blood pressure this morning in the 160s.   -Transition from IV heparin to Eliquis today.   -Due to severity and extensive saddle PE with right ventricular strain, abnormal 2D echo, elevated troponins PCCM was consulted who assessed the patient and felt as patient was symptomatically improving on heparin no role for other interventions at this time.  -PCCM to arrange outpatient pulmonary follow-up and repeat 2D echo (85-month.   -Appreciate PCCM input and recommendations.   2.  Demand ischemia/elevated troponin/NSTEMI -Felt likely secondary to problem #1, acute large saddle PE. -BNP 42.9. -2D echo with left ventricular EF of 50 to 589%,QJJH grade 1 diastolic dysfunction, right ventricular systolic function mildly reduced, McConnell sign noted. -Continue IV heparin and transition to Eliquis today. -We will need repeat 2D  echo in 3 months per pulmonary.. -Follow.  3.  Hypertension -Blood pressure of 165/69 this morning.   -Continue Cardura.   -Start Norvasc 5 mg daily.  4.  History of ulcerative  colitis -Continue current home regimen of prednisone 40 mg daily, [place]. -Outpatient follow-up with GI for steroid taper.    5.  Thrombocytopenia -No overt bleeding.   -Platelet count currently at 131.   -Monitor on anticoagulation.   6.  Obesity -Estimated BMI 34.24 kg per metered squared.  7.  Hyperkalemia -Repeat potassium level was normalized.  Follow.   DVT prophylaxis: Heparin >>>> Eliquis Code Status: Full Family Communication: Updated patient.  No family at bedside. Disposition:   Status is: Inpatient  Remains inpatient appropriate because:IV treatments appropriate due to intensity of illness or inability to take PO  Dispo: The patient is from: Home              Anticipated d/c is to: Home              Patient currently is not medically stable to d/c.   Difficult to place patient No       Consultants:  PCCM: Dr. Silas Flood 11/22/2020  Procedures:  CT head 11/22/2020 CT angiogram chest 11/20/2020 Chest x-ray 11/20/2020 2D echo 11/21/2020 Lower extremity Doppler 11/21/2020   Antimicrobials:  None   Subjective: Sitting up in bed.  Currently on room air with sats of 93 to 96%.  Denies any chest pain.  Feels shortness of breath is improving.  No abdominal pain.  No bleeding.  Stated sat in recliner yesterday.   Objective: Vitals:   11/23/20 0730 11/23/20 0800 11/23/20 0900 11/23/20 0927  BP:  (!) 155/100 (!) 165/69 (!) 165/69  Pulse:  (!) 56 (!) 56   Resp:  (!) 23 (!) 22   Temp: (!) 97.5 F (36.4 C)     TempSrc: Oral     SpO2:  97% 93%   Weight:      Height:        Intake/Output Summary (Last 24 hours) at 11/23/2020 1032 Last data filed at 11/23/2020 0800 Gross per 24 hour  Intake 1048.26 ml  Output 1225 ml  Net -176.74 ml    Filed Weights   11/20/20 1413 11/21/20 0254  Weight: 114.3 kg 114.5 kg    Examination:  General exam: NAD. Respiratory system: Coarse breath sounds left base.  No wheezing.  No rhonchi.  No crackles.  Fair air movement.   Speaking in full sentences.  Cardiovascular system: RRR no murmurs rubs or gallops.  No JVD.  No lower extremity edema.  Gastrointestinal system: Abdomen is soft, nontender, nondistended, positive bowel sounds.  No rebound.  No guarding.  Central nervous system: Alert and oriented.  Moving extremities spontaneously.  No focal neurological deficits.  Extremities: Symmetric 5 x 5 power. Skin: No rashes, lesions or ulcers Psychiatry: Judgement and insight appear normal. Mood & affect appropriate.     Data Reviewed: I have personally reviewed following labs and imaging studies  CBC: Recent Labs  Lab 11/17/20 0916 11/20/20 1608 11/21/20 0409 11/21/20 0907 11/22/20 0240 11/23/20 0304  WBC 6.5 9.6 11.5* 11.3* 7.5 6.7  NEUTROABS 3.6 8.5*  --   --   --   --   HGB 15.5 14.9 14.8 15.3 14.4 14.3  HCT 46.7 45.3 45.8 47.1 45.4 44.9  MCV 86.0 87.5 87.7 86.9 88.8 88.0  PLT 163.0 118* 124* PLATELET CLUMPS NOTED ON SMEAR, UNABLE TO ESTIMATE 121* 131*  Basic Metabolic Panel: Recent Labs  Lab 11/17/20 0916 11/20/20 1608 11/21/20 0409 11/22/20 0859 11/22/20 1042 11/23/20 0304  NA 139 135 135 134*  --  136  K 4.0 4.0 4.1 6.0* 4.2 4.9  CL 106 101 102 104  --  106  CO2 23 22 22 23   --  22  GLUCOSE 90 134* 127* 116*  --  127*  BUN 16 19 19 23   --  23  CREATININE 1.07 1.05 1.00 0.90  --  1.10  CALCIUM 8.9 8.2* 8.7* 8.3*  --  8.2*  MG  --   --   --  2.3  --   --      GFR: Estimated Creatinine Clearance: 73.5 mL/min (by C-G formula based on SCr of 1.1 mg/dL).  Liver Function Tests: Recent Labs  Lab 11/17/20 0916 11/20/20 1608  AST 12 25  ALT 17 32  ALKPHOS 53 45  BILITOT 0.3 0.8  PROT 7.6 7.1  ALBUMIN 3.9 3.2*     CBG: Recent Labs  Lab 11/22/20 0732 11/22/20 1207 11/22/20 1601 11/22/20 2132 11/23/20 0734  GLUCAP 129* 137* 180* 141* 115*      Recent Results (from the past 240 hour(s))  Resp Panel by RT-PCR (Flu A&B, Covid) Nasopharyngeal Swab     Status: None    Collection Time: 11/20/20  4:08 PM   Specimen: Nasopharyngeal Swab; Nasopharyngeal(NP) swabs in vial transport medium  Result Value Ref Range Status   SARS Coronavirus 2 by RT PCR NEGATIVE NEGATIVE Final    Comment: (NOTE) SARS-CoV-2 target nucleic acids are NOT DETECTED.  The SARS-CoV-2 RNA is generally detectable in upper respiratory specimens during the acute phase of infection. The lowest concentration of SARS-CoV-2 viral copies this assay can detect is 138 copies/mL. A negative result does not preclude SARS-Cov-2 infection and should not be used as the sole basis for treatment or other patient management decisions. A negative result may occur with  improper specimen collection/handling, submission of specimen other than nasopharyngeal swab, presence of viral mutation(s) within the areas targeted by this assay, and inadequate number of viral copies(<138 copies/mL). A negative result must be combined with clinical observations, patient history, and epidemiological information. The expected result is Negative.  Fact Sheet for Patients:  EntrepreneurPulse.com.au  Fact Sheet for Healthcare Providers:  IncredibleEmployment.be  This test is no t yet approved or cleared by the [place] FDA and  has been authorized for detection and/or diagnosis of SARS-CoV-2 by FDA under an Emergency Use Authorization (EUA). This EUA will remain  in effect (meaning this test can be used) for the duration of the COVID-19 declaration under Section 564(b)(1) of the Act, 21 U.S.C.section 360bbb-3(b)(1), unless the authorization is terminated  or revoked sooner.       Influenza A by PCR NEGATIVE NEGATIVE Final   Influenza B by PCR NEGATIVE NEGATIVE Final    Comment: (NOTE) The Xpert Xpress SARS-CoV-2/FLU/RSV plus assay is intended as an aid in the diagnosis of influenza from Nasopharyngeal swab specimens and should not be used as a sole basis for treatment.  Nasal washings and aspirates are unacceptable for Xpert Xpress SARS-CoV-2/FLU/RSV testing.  Fact Sheet for Patients: EntrepreneurPulse.com.au  Fact Sheet for Healthcare Providers: IncredibleEmployment.be  This test is not yet approved or cleared by the [place] FDA and has been authorized for detection and/or diagnosis of SARS-CoV-2 by FDA under an Emergency Use Authorization (EUA). This EUA will remain in effect (meaning this test can be used) for the duration of  the COVID-19 declaration under Section 564(b)(1) of the Act, 21 U.S.C. section 360bbb-3(b)(1), unless the authorization is terminated or revoked.  Performed at [facility], [place] [address] [place], [place] [address]   MRSA Next Gen by PCR, Nasal     Status: None   Collection Time: 11/21/20  3:01 AM   Specimen: Nasal Mucosa; Nasal Swab  Result Value Ref Range Status   MRSA by PCR Next Gen NOT DETECTED NOT DETECTED Final    Comment: (NOTE) The GeneXpert MRSA Assay (FDA approved for NASAL specimens only), is one component of a comprehensive MRSA colonization surveillance program. It is not intended to diagnose MRSA infection nor to guide or monitor treatment for MRSA infections. Test performance is not FDA approved in patients less than 30 years old. Performed at [facility], [place] [address], [place], [place] [address]           Radiology Studies: CT HEAD WO CONTRAST  Result Date: 11/22/2020 CLINICAL DATA:  Sudden onset confusion. EXAM: CT HEAD WITHOUT CONTRAST TECHNIQUE: Contiguous axial images were obtained from the base of the skull through the vertex without intravenous contrast. COMPARISON:  12/12/2017 FINDINGS: Brain: Mild diffuse cerebral atrophy. Ventricular dilatation consistent with central atrophy. Low-attenuation changes in the deep white matter consistent with small vessel ischemia. Old lacunar infarcts in the basal ganglia.  No mass effect or midline shift. No abnormal extra-axial fluid collections. Gray-white matter junctions are distinct. Basal cisterns are not effaced. No acute intracranial hemorrhage. Vascular: Moderate intracranial arterial vascular calcifications. Skull: Calvarium appears intact. Sinuses/Orbits: Mucosal thickening in the maxillary antra. No acute air-fluid levels. Mastoid air cells are clear. Other: No significant changes since the previous study. Mild interval progression of atrophic changes. IMPRESSION: No acute intracranial abnormalities. Chronic atrophy and small vessel ischemic change. Electronically Signed   By: Lucienne Capers M.D.   On: 11/22/2020 03:52   VAS [place] LOWER EXTREMITY VENOUS (DVT)  Result Date: 11/21/2020  Lower Venous DVT Study Patient Name:  Bill Fox  Date of Exam:   11/21/2020 Medical Rec #: [id number]         Accession #:    [id number] Date of Birth: 07-04-1940         Patient Gender: M Patient Age:   79Y Exam Location:  [facility] Procedure:      VAS [place] LOWER EXTREMITY VENOUS (DVT) Referring Phys: [id number] RIPUDEEP K RAI --------------------------------------------------------------------------------  Indications: Pulmonary embolism.  Comparison Study: EMR show previous exam on 06/11/13 but unable to see results. Performing Technologist: Rogelia Rohrer RVT, RDMS  Examination Guidelines: A complete evaluation includes B-mode imaging, spectral Doppler, color Doppler, and power Doppler as needed of all accessible portions of each vessel. Bilateral testing is considered an integral part of a complete examination. Limited examinations for reoccurring indications may be performed as noted. The reflux portion of the exam is performed with the patient in reverse Trendelenburg.  +---------+---------------+---------+-----------+----------+--------------+ RIGHT    CompressibilityPhasicitySpontaneityPropertiesThrombus Aging  +---------+---------------+---------+-----------+----------+--------------+ CFV      Full           Yes      Yes                                 +---------+---------------+---------+-----------+----------+--------------+ SFJ      Full                                                        +---------+---------------+---------+-----------+----------+--------------+  FV Prox  Full           Yes      Yes                                 +---------+---------------+---------+-----------+----------+--------------+ FV Mid   Full           Yes      Yes                                 +---------+---------------+---------+-----------+----------+--------------+ FV DistalFull           Yes      Yes                                 +---------+---------------+---------+-----------+----------+--------------+ PFV      Full                                                        +---------+---------------+---------+-----------+----------+--------------+ POP      Full           Yes      Yes                                 +---------+---------------+---------+-----------+----------+--------------+ PTV      Full                                                        +---------+---------------+---------+-----------+----------+--------------+ PERO     Full                                                        +---------+---------------+---------+-----------+----------+--------------+   +---------+---------------+---------+-----------+----------+-----------------+ LEFT     CompressibilityPhasicitySpontaneityPropertiesThrombus Aging    +---------+---------------+---------+-----------+----------+-----------------+ CFV      Full           Yes      Yes                                    +---------+---------------+---------+-----------+----------+-----------------+ SFJ      Full                                                            +---------+---------------+---------+-----------+----------+-----------------+ FV Prox  Partial        Yes      Yes                  Age Indeterminate +---------+---------------+---------+-----------+----------+-----------------+ FV Mid   Partial        Yes  Yes                  Age Indeterminate +---------+---------------+---------+-----------+----------+-----------------+ FV DistalFull           Yes      Yes                                    +---------+---------------+---------+-----------+----------+-----------------+ PFV      Full                                                           +---------+---------------+---------+-----------+----------+-----------------+ POP      Full           Yes      Yes                                    +---------+---------------+---------+-----------+----------+-----------------+ PTV      Full                                                           +---------+---------------+---------+-----------+----------+-----------------+ PERO     Full                                                           +---------+---------------+---------+-----------+----------+-----------------+     Summary: BILATERAL: -No evidence of popliteal cyst, bilaterally. RIGHT: - There is no evidence of deep vein thrombosis in the lower extremity. - There is no evidence of superficial venous thrombosis.  LEFT: - Findings consistent with age indeterminate deep vein thrombosis involving the left femoral vein. - There is no evidence of superficial venous thrombosis.  *See table(s) above for measurements and observations. Electronically signed by Ruta Hinds MD on 11/21/2020 at 3:47:39 PM.    Final         Scheduled Meds:  amLODipine  5 mg Oral Daily   apixaban  10 mg Oral BID   Followed by   Derrill Memo ON 11/30/2020] apixaban  5 mg Oral BID   Chlorhexidine Gluconate Cloth  6 each Topical Q0600   doxazosin  4 mg Oral QHS   mouth rinse  15 mL Mouth  Rinse BID   mesalamine  1,500 mg Oral Daily   mirtazapine  30 mg Oral QHS   predniSONE  10 mg Oral Q6H   Continuous Infusions:     LOS: 3 days    Time spent: 40 minutes    Irine Seal, MD [facility]   To contact the attending provider between 7A-7P or the covering provider during after hours 7P-7A, please log into the web site [email or web address] and access using universal [place] password for that web site. If you do not have the password, please call the hospital operator.  11/23/2020, 10:32 AM

## 2020-11-24 LAB — GLUCOSE, CAPILLARY
Glucose-Capillary: 114 mg/dL — ABNORMAL HIGH (ref 70–99)
Glucose-Capillary: 173 mg/dL — ABNORMAL HIGH (ref 70–99)
Glucose-Capillary: 132 mg/dL — ABNORMAL HIGH (ref 70–99)
Glucose-Capillary: 141 mg/dL — ABNORMAL HIGH (ref 70–99)

## 2020-11-24 LAB — CBC
HCT: 47.5 % (ref 39.0–52.0)
Hemoglobin: 15.1 g/dL (ref 13.0–17.0)
MCH: 28.3 pg (ref 26.0–34.0)
MCHC: 31.8 g/dL (ref 30.0–36.0)
MCV: 89 fL (ref 80.0–100.0)
Platelets: 148 10*3/uL — ABNORMAL LOW (ref 150–400)
RBC: 5.34 MIL/uL (ref 4.22–5.81)
RDW: 14.2 % (ref 11.5–15.5)
WBC: 7.9 10*3/uL (ref 4.0–10.5)
nRBC: 0 % (ref 0.0–0.2)

## 2020-11-24 LAB — BASIC METABOLIC PANEL
Anion gap: 10 (ref 5–15)
BUN: 23 mg/dL (ref 8–23)
CO2: 21 mmol/L — ABNORMAL LOW (ref 22–32)
Calcium: 8.8 mg/dL — ABNORMAL LOW (ref 8.9–10.3)
Chloride: 106 mmol/L (ref 98–111)
Creatinine, Ser: 0.89 mg/dL (ref 0.61–1.24)
GFR, Estimated: >60 mL/min (ref >60)
Glucose, Bld: 116 mg/dL — ABNORMAL HIGH (ref 70–99)
Potassium: 4.4 mmol/L (ref 3.5–5.1)
Sodium: 137 mmol/L (ref 135–145)

## 2020-11-24 NOTE — Plan of Care (Signed)

## 2020-11-24 NOTE — Progress Notes (Signed)
PROGRESS NOTE    Bill Fox  KPT:[id number] DOB: [birth year] DOA: 11/20/2020 PCP: [name], DO   Chief Complaint  Patient presents with   Shortness of Breath    Brief Narrative:  Patient is a 80 year old male with history of hypertension, DM, ulcerative colitis, history of PE 12 years ago (does not remember if he was on [facility]), not on current anticoagulation presented for dyspnea and left shoulder pain.  Patient was having worsening dyspnea over the past few weeks however woke up on the morning of admission with increased shortness of breath, occurring at rest and with ambulation and exertion.  He also noticed some pain in the left shoulder blade region with increasing shortness of breath.  No fevers or chills.  No coughing.  O2 sats 75% on room air when checked by the paramedics at [facility] and his heart rate was in 150s, so EMS was called.  Patient was brought to ED on a nonrebreather, given nebulizer breathing treatment, Solu-Medrol.  In ED he was weaned to nasal cannula and felt better.  Patient has history of ulcerative colitis and has been on 5 mg daily for the last few weeks per his GI, Dr. Henrene Pastor. D-dimer> 20, initial troponin 205, increased to 284, no acute ischemia on EKG CT angiogram chest showed extensive pulmonary embolism causing right heart strain.  COVID-19 negative. Patient was admitted to ICU PCCM was consulted by EDP however recommended staying on heparin and if decompensates clinically will need repeat tPA and they will consult. -Due to abnormal 2D echo, large saddle embolus, DVT, will consult formally with PCCM for further evaluation and management   Assessment & Plan:   Principal Problem:   Bilateral pulmonary embolism ([place]) Active Problems:   Morbid obesity ([place])   Essential hypertension   Cor pulmonale, acute (HCC)   Thrombocytopenia (HCC)   NSTEMI (non-ST elevated myocardial infarction) ([place])   Ulcerative colitis ([place])   DVT (deep venous  thrombosis) [facility]): Age indeterminant left femoral vein   Acute saddle pulmonary embolism (HCC)   1 acute extensive bilateral PE with right heart strain/large saddle embolus/age-indeterminate left femoral vein DVT/acute respiratory failure with hypoxia -Patient presenting with worsening shortness of breath at rest and on exertion. -CT angiogram chest showed large saddle embolus with extension into the lobar pulmonary arteries and most segmental arteries, right heart strain submassive PE.  Patchy infiltration of atelectasis in the posterior lungs, cholelithiasis. -2D echo done with right ventricular systolic function mildly reduced, basal function is preserved but McConnell sign noted.  Left ventricular EF 50 to 17%,GYFV, grade 1 diastolic dysfunction. -Lower extremity Dopplers with age-indeterminate left femoral vein DVT. -Patient with sats of 94% on room air currently.   -Systolic blood pressure improving.   -Patient has been transitioned from IV heparin to Eliquis.   -Due to severity and extensive saddle PE with right ventricular strain, abnormal 2D echo, elevated troponins PCCM was consulted who assessed the patient and felt as patient was symptomatically improving on heparin no role for other interventions at this time.  -PCCM to arrange outpatient pulmonary follow-up and repeat 2D echo (72-month.   -Appreciate PCCM input and recommendations.   2.  Demand ischemia/elevated troponin/NSTEMI -Felt likely secondary to problem #1, acute large saddle PE. -BNP 42.9. -2D echo with left ventricular EF of 50 to 549%,SWHQ grade 1 diastolic dysfunction, right ventricular systolic function mildly reduced, McConnell sign noted. -Was on IV heparin and has been transitioned to Eliquis.   -Wiill need repeat 2D echo  in 3 months per pulmonary.    3.  Hypertension -BP improved on current regimen of Cardura and Norvasc.    4.  History of ulcerative colitis -Continue current regimen of prednisone 40 mg daily,  Pentasa.  -Outpatient follow-up with GI.    5.  Thrombocytopenia -No overt bleeding.   -Platelet count improving currently at 148.    6.  Obesity -Estimated BMI 34.24 kg per metered squared.  7.  Hyperkalemia -Resolved.  Potassium of 4.4.   DVT prophylaxis: Heparin >>>> Eliquis Code Status: Full Family Communication: Updated patient.  No family at bedside. Disposition:   Status is: Inpatient  Remains inpatient appropriate because:IV treatments appropriate due to intensity of illness or inability to take PO  Dispo: The patient is from: Home              Anticipated d/c is to: Home              Patient currently is not medically stable to d/c.   Difficult to place patient No       Consultants:  PCCM: Dr. Silas Flood 11/22/2020  Procedures:  CT head 11/22/2020 CT angiogram chest 11/20/2020 Chest x-ray 11/20/2020 2D echo 11/21/2020 Lower extremity Doppler 11/21/2020   Antimicrobials:  None   Subjective: Patient sitting up in recliner.  No chest pain.  No shortness of breath.  No abdominal pain.  No bleeding.  Awaiting PT evaluation.  States ambulated back and forth to the bathroom with no significant shortness of breath on exertion.   Objective: Vitals:   11/23/20 1115 11/23/20 1200 11/23/20 2143 11/24/20 0537  BP:  (!) 133/94 (!) 152/85 (!) 144/79  Pulse:  82 70 63  Resp:  18 20 18   Temp: 97.7 F (36.5 C)  97.9 F (36.6 C) 97.7 F (36.5 C)  TempSrc: Oral     SpO2:  95% 100% 94%  Weight:      Height:        Intake/Output Summary (Last 24 hours) at 11/24/2020 1105 Last data filed at 11/24/2020 0937 Gross per 24 hour  Intake 421.71 ml  Output 600 ml  Net -178.29 ml    Filed Weights   11/20/20 1413 11/21/20 0254  Weight: 114.3 kg 114.5 kg    Examination:  General exam: NAD. Respiratory system: Lungs clear to auscultation bilaterally.  No wheezes, no crackles, no rhonchi.  Fair air movement.  Speaking in full sentences. Cardiovascular system: Regular rate  rhythm no murmurs rubs or gallops.  No JVD.  No lower extremity edema. Gastrointestinal system: Abdomen is soft, nontender, nondistended, positive bowel sounds.  No rebound.  No guarding.  Central nervous system: Alert and oriented.  Moving extremities spontaneously.  No focal neurological deficits.  Extremities: Symmetric 5 x 5 power. Skin: No rashes, lesions or ulcers Psychiatry: Judgement and insight appear normal. Mood & affect appropriate.     Data Reviewed: I have personally reviewed following labs and imaging studies  CBC: Recent Labs  Lab 11/20/20 1608 11/21/20 0409 11/21/20 0907 11/22/20 0240 11/23/20 0304 11/24/20 0449  WBC 9.6 11.5* 11.3* 7.5 6.7 7.9  NEUTROABS 8.5*  --   --   --   --   --   HGB 14.9 14.8 15.3 14.4 14.3 15.1  HCT 45.3 45.8 47.1 45.4 44.9 47.5  MCV 87.5 87.7 86.9 88.8 88.0 89.0  PLT 118* 124* PLATELET CLUMPS NOTED ON SMEAR, UNABLE TO ESTIMATE 121* 131* 148*     Basic Metabolic Panel: Recent Labs  Lab 11/20/20 1608  11/21/20 0409 11/22/20 0859 11/22/20 1042 11/23/20 0304 11/24/20 0449  NA 135 135 134*  --  136 137  K 4.0 4.1 6.0* 4.2 4.9 4.4  CL 101 102 104  --  106 106  CO2 22 22 23   --  22 21*  GLUCOSE 134* 127* 116*  --  127* 116*  BUN 19 19 23   --  23 23  CREATININE 1.05 1.00 0.90  --  1.10 0.89  CALCIUM 8.2* 8.7* 8.3*  --  8.2* 8.8*  MG  --   --  2.3  --   --   --      GFR: Estimated Creatinine Clearance: 90.8 mL/min (by C-G formula based on SCr of 0.89 mg/dL).  Liver Function Tests: Recent Labs  Lab 11/20/20 1608  AST 25  ALT 32  ALKPHOS 45  BILITOT 0.8  PROT 7.1  ALBUMIN 3.2*     CBG: Recent Labs  Lab 11/23/20 0734 11/23/20 1114 11/23/20 1625 11/23/20 2143 11/24/20 0748  GLUCAP 115* 156* 116* 106* 114*      Recent Results (from the past 240 hour(s))  Resp Panel by RT-PCR (Flu A&B, Covid) Nasopharyngeal Swab     Status: None   Collection Time: 11/20/20  4:08 PM   Specimen: Nasopharyngeal Swab;  Nasopharyngeal(NP) swabs in vial transport medium  Result Value Ref Range Status   SARS Coronavirus 2 by RT PCR NEGATIVE NEGATIVE Final    Comment: (NOTE) SARS-CoV-2 target nucleic acids are NOT DETECTED.  The SARS-CoV-2 RNA is generally detectable in upper respiratory specimens during the acute phase of infection. The lowest concentration of SARS-CoV-2 viral copies this assay can detect is 138 copies/mL. A negative result does not preclude SARS-Cov-2 infection and should not be used as the sole basis for treatment or other patient management decisions. A negative result may occur with  improper specimen collection/handling, submission of specimen other than nasopharyngeal swab, presence of viral mutation(s) within the areas targeted by this assay, and inadequate number of viral copies(<138 copies/mL). A negative result must be combined with clinical observations, patient history, and epidemiological information. The expected result is Negative.  Fact Sheet for Patients:  EntrepreneurPulse.com.au  Fact Sheet for Healthcare Providers:  IncredibleEmployment.be  This test is no t yet approved or cleared by the [place] FDA and  has been authorized for detection and/or diagnosis of SARS-CoV-2 by FDA under an Emergency Use Authorization (EUA). This EUA will remain  in effect (meaning this test can be used) for the duration of the COVID-19 declaration under Section 564(b)(1) of the Act, 21 U.S.C.section 360bbb-3(b)(1), unless the authorization is terminated  or revoked sooner.       Influenza A by PCR NEGATIVE NEGATIVE Final   Influenza B by PCR NEGATIVE NEGATIVE Final    Comment: (NOTE) The Xpert Xpress SARS-CoV-2/FLU/RSV plus assay is intended as an aid in the diagnosis of influenza from Nasopharyngeal swab specimens and should not be used as a sole basis for treatment. Nasal washings and aspirates are unacceptable for Xpert Xpress  SARS-CoV-2/FLU/RSV testing.  Fact Sheet for Patients: EntrepreneurPulse.com.au  Fact Sheet for Healthcare Providers: IncredibleEmployment.be  This test is not yet approved or cleared by the [place] FDA and has been authorized for detection and/or diagnosis of SARS-CoV-2 by FDA under an Emergency Use Authorization (EUA). This EUA will remain in effect (meaning this test can be used) for the duration of the COVID-19 declaration under Section 564(b)(1) of the Act, 21 U.S.C. section 360bbb-3(b)(1), unless the authorization is  terminated or revoked.  Performed at [facility], [place] [address], [place], [place] [address]   MRSA Next Gen by PCR, Nasal     Status: None   Collection Time: 11/21/20  3:01 AM   Specimen: Nasal Mucosa; Nasal Swab  Result Value Ref Range Status   MRSA by PCR Next Gen NOT DETECTED NOT DETECTED Final    Comment: (NOTE) The GeneXpert MRSA Assay (FDA approved for NASAL specimens only), is one component of a comprehensive MRSA colonization surveillance program. It is not intended to diagnose MRSA infection nor to guide or monitor treatment for MRSA infections. Test performance is not FDA approved in patients less than 59 years old. Performed at [facility], [place] [address], [place], [place] [address]           Radiology Studies: No results found.      Scheduled Meds:  amLODipine  5 mg Oral Daily   apixaban  10 mg Oral BID   Followed by   Derrill Memo ON 11/30/2020] apixaban  5 mg Oral BID   Chlorhexidine Gluconate Cloth  6 each Topical Q0600   doxazosin  4 mg Oral QHS   mouth rinse  15 mL Mouth Rinse BID   mesalamine  1,500 mg Oral Daily   mirtazapine  30 mg Oral QHS   predniSONE  10 mg Oral Q6H   Continuous Infusions:     LOS: 4 days    Time spent: 40 minutes    Irine Seal, MD [facility]   To contact the attending provider between 7A-7P or the  covering provider during after hours 7P-7A, please log into the web site [email or web address] and access using universal [place] password for that web site. If you do not have the password, please call the hospital operator.  11/24/2020, 11:05 AM

## 2020-11-24 NOTE — Progress Notes (Signed)
Occupational Therapy Evaluation  Patient lives home alone in a two level house, does not use downstairs basement and 7 steps to enter. Patient fully independent at baseline, works with different [employer] groups/organizing [place], ect. Patient demonstrates independence with functional transfers, ambulation in hallway and lower body dressing task during OT eval. O2 maintained 96-98% on room air throughout session. Patient reports plan to D/C home tomorrow. No further acute OT needs at this time, please re-consult if new needs arise.     11/24/20 1316  OT Visit Information  Last OT Received On 11/24/20  Assistance Needed +1  History of Present Illness 80 year old male with history of hypertension, DM, ulcerative colitis, history of PE 12 years ago, not on current anticoagulation presented for dyspnea and left shoulder pain. Pt admitted with acute extensive bilateral PE with right heart strain/large saddle embolus/age-indeterminate left femoral vein DVT/acute respiratory failure with hypoxia  Precautions  Precautions None  Restrictions  Weight Bearing Restrictions No  Home Living  Family/patient expects to be discharged to: Private residence  Living Arrangements Alone  Available Help at Discharge Family;Available PRN/intermittently (separated from spouse, lives .5 miles down the road)  Type of [place] to enter  Entrance Stairs-Number of Steps back porch [employer] [place] One level;Laundry or work area in [place] unit;Walk-in Engineer, materials None  Prior Function  Level of Independence Independent  Communication  Communication No difficulties  Pain Assessment  Pain Assessment No/denies pain  Cognition  Arousal/Alertness Awake/alert  Behavior During Therapy WFL for tasks assessed/performed  Overall Cognitive Status Within Functional Limits for tasks assessed  Upper Extremity Assessment  Upper Extremity  Assessment Overall WFL for tasks assessed  Lower Extremity Assessment  Lower Extremity Assessment Defer to PT evaluation  Cervical / Trunk Assessment  Cervical / Trunk Assessment Normal  ADL  Overall ADL's  Independent  General ADL Comments patient reports has been ambulating to the bathroom, is able to demonstrate LB dressing task and functional ambulation/transfers without any assistance.  Bed Mobility  General bed mobility comments pt in recliner on arrival  Transfers  Overall transfer level Modified independent  Equipment used None  Balance  Overall balance assessment Mild deficits observed, not formally tested  General Comments  General comments (skin integrity, edema, etc.) O2 96-98% on room air with ambulation  OT - End of Session  Activity Tolerance Patient tolerated treatment well  Patient left in chair;with call bell/phone within reach  Nurse Communication Mobility status  OT Assessment  OT Recommendation/Assessment Patient does not need any further OT services  OT Visit Diagnosis Unsteadiness on feet (R26.81)  OT Problem List Decreased activity tolerance  AM-PAC OT "6 Clicks" Daily Activity Outcome Measure (Version 2)  Help from another person eating meals? 4  Help from another person taking care of personal grooming? 4  Help from another person toileting, which includes using toliet, bedpan, or urinal? 4  Help from another person bathing (including washing, rinsing, drying)? 4  Help from another person to put on and taking off regular upper body clothing? 4  Help from another person to put on and taking off regular lower body clothing? 4  6 Click Score 24  OT Recommendation  Follow Up Recommendations No OT follow up  OT Equipment None recommended by OT  Acute Rehab OT Goals  Patient Stated Goal home soon  OT Goal Formulation All assessment and education complete, DC therapy  OT Time Calculation  OT Start Time (ACUTE ONLY) 1206  OT Stop Time (ACUTE ONLY) 1219  OT  Time Calculation (min) 13 min  OT General Charges  $OT Visit 1 Visit  OT Evaluation  $OT Eval Low Complexity 1 Low  Written Expression  Dominant Hand Right   Delbert Phenix OT OT pager: [phone]

## 2020-11-24 NOTE — Care Management Important Message (Signed)
Important Message  Patient Details IM Letter given to the Patient. Name: Bill Fox MRN: [id number] Date of Birth: 09-Mar-1941   Medicare Important Message Given:  Yes     Kerin Salen 11/24/2020, 10:37 AM

## 2020-11-24 NOTE — Telephone Encounter (Signed)
Order has been placed.

## 2020-11-24 NOTE — Evaluation (Signed)
Physical Therapy Evaluation Patient Details Name: Bill Fox MRN: [id number] DOB: 09-Jun-1940 Today's Date: 11/24/2020   History of Present Illness  80 year old male with history of hypertension, DM, ulcerative colitis, history of PE 12 years ago, not on current anticoagulation presented for dyspnea and left shoulder pain. Pt admitted with acute extensive bilateral PE with right heart strain/large saddle embolus/age-indeterminate left femoral vein DVT/acute respiratory failure with hypoxia  Clinical Impression  Patient evaluated by Physical Therapy with no further acute PT needs identified. All education has been completed and the patient has no further questions.  See below for details. SpO2=97% on RA, amb ~240'  See below for any follow-up Physical Therapy or equipment needs. PT is signing off. Thank you for this referral.     Follow Up Recommendations No PT follow up    Equipment Recommendations  None recommended by PT    Recommendations for Other Services       Precautions / Restrictions Precautions Precaution Comments: monitor Sats Restrictions Weight Bearing Restrictions: No      Mobility  Bed Mobility               General bed mobility comments: pt in recliner on arrival    Transfers Overall transfer level: Needs assistance Equipment used: None Transfers: Sit to/from Stand Sit to Stand: Modified independent (Device/Increase time)            Ambulation/Gait Ambulation/Gait assistance: Supervision Gait Distance (Feet): 240 Feet Assistive device: None Gait Pattern/deviations: Step-through pattern;Drifts right/left;Narrow base of support     General Gait Details: slow but steady gait without LOB, stride length incr with distance. SpO2=97% on RA. DOE 2-3/4 end of Comptroller Rankin (Stroke Patients Only)       Balance Overall balance assessment: Needs assistance           Standing  balance-Leahy Scale: Fair Standing balance comment: not tested to moderate perturbations             High level balance activites: Direction changes;Turns;Head turns High Level Balance Comments: no LOB with above             Pertinent Vitals/Pain Pain Assessment: No/denies pain    Home Living Family/patient expects to be discharged to:: Private residence Living Arrangements: Alone   Type of Home: House Home Access: Stairs to enter   [employer] of Steps: back porch 7 Home Layout: One level;Laundry or work area in [employer]: None      Prior Function Level of Independence: Independent               Journalist, newspaper   Dominant Hand: Right    Extremity/Trunk Assessment   Upper Extremity Assessment Upper Extremity Assessment: Defer to OT evaluation    Lower Extremity Assessment Lower Extremity Assessment: Overall WFL for tasks assessed       Communication   Communication: No difficulties  Cognition Arousal/Alertness: Awake/alert Behavior During Therapy: WFL for tasks assessed/performed Overall Cognitive Status: Within Functional Limits for tasks assessed                                        General Comments      Exercises     Assessment/Plan    PT Assessment Patent does not need any further PT services  PT  Problem List         PT Treatment Interventions      PT Goals (Current goals can be found in the Care Plan section)  Acute Rehab PT Goals Patient Stated Goal: home soon PT Goal Formulation: All assessment and education complete, DC therapy    Frequency     Barriers to discharge        Co-evaluation               AM-PAC PT "6 Clicks" Mobility  Outcome Measure Help needed turning from your back to your side while in a flat bed without using bedrails?: A Little Help needed moving from lying on your back to sitting on the side of a flat bed without using bedrails?: A Little Help  needed moving to and from a bed to a chair (including a wheelchair)?: None Help needed standing up from a chair using your arms (e.g., wheelchair or bedside chair)?: None Help needed to walk in hospital room?: None Help needed climbing 3-5 steps with a railing? : None 6 Click Score: 22    End of Session Equipment Utilized During Treatment: Gait belt Activity Tolerance: Patient tolerated treatment well Patient left: in chair;with call bell/phone within reach (no alarm on arrival)   PT Visit Diagnosis: Other abnormalities of gait and mobility (R26.89)    Time: 1209-1220 PT Time Calculation (min) (ACUTE ONLY): 11 min   Charges:   PT Evaluation $PT Eval Low Complexity: 1 Low          Bill Fox, PT  Acute Rehab Dept ([facility]) [phone] Pager [phone]  11/24/2020   [facility] 11/24/2020, 1:16 PM

## 2020-11-25 DIAGNOSIS — I2602 Saddle embolus of pulmonary artery with acute cor pulmonale: Principal | ICD-10-CM

## 2020-11-25 LAB — CBC
HCT: 50 % (ref 39.0–52.0)
Hemoglobin: 16.2 g/dL (ref 13.0–17.0)
MCH: 28.2 pg (ref 26.0–34.0)
MCHC: 32.4 g/dL (ref 30.0–36.0)
MCV: 87.1 fL (ref 80.0–100.0)
Platelets: 186 10*3/uL (ref 150–400)
RBC: 5.74 MIL/uL (ref 4.22–5.81)
RDW: 14.3 % (ref 11.5–15.5)
WBC: 8.6 10*3/uL (ref 4.0–10.5)
nRBC: 0 % (ref 0.0–0.2)

## 2020-11-25 LAB — BASIC METABOLIC PANEL
Anion gap: 11 (ref 5–15)
BUN: 24 mg/dL — ABNORMAL HIGH (ref 8–23)
CO2: 20 mmol/L — ABNORMAL LOW (ref 22–32)
Calcium: 9.1 mg/dL (ref 8.9–10.3)
Chloride: 103 mmol/L (ref 98–111)
Creatinine, Ser: 1.11 mg/dL (ref 0.61–1.24)
GFR, Estimated: >60 mL/min (ref >60)
Glucose, Bld: 123 mg/dL — ABNORMAL HIGH (ref 70–99)
Potassium: 4.2 mmol/L (ref 3.5–5.1)
Sodium: 134 mmol/L — ABNORMAL LOW (ref 135–145)

## 2020-11-25 LAB — GLUCOSE, CAPILLARY
Glucose-Capillary: 119 mg/dL — ABNORMAL HIGH (ref 70–99)
Glucose-Capillary: 122 mg/dL — ABNORMAL HIGH (ref 70–99)

## 2020-11-25 MED ORDER — AMLODIPINE BESYLATE 5 MG PO TABS
2.5000 mg | ORAL_TABLET | Freq: Every day | ORAL | Status: DC
  Administered 2020-11-25: 2.5 mg via ORAL
  Filled 2020-11-25: qty 1

## 2020-11-25 MED ORDER — AMLODIPINE BESYLATE 2.5 MG PO TABS: 2.5000 mg | ORAL_TABLET | Freq: Every day | ORAL | 1 refills | Status: DC

## 2020-11-25 MED ORDER — DOXAZOSIN MESYLATE 8 MG PO TABS: 4.0000 mg | ORAL_TABLET | Freq: Every day | ORAL | Status: DC

## 2020-11-25 MED ORDER — APIXABAN 5 MG PO TABS: 10.0000 mg | ORAL_TABLET | Freq: Two times a day (BID) | ORAL | 1 refills | Status: DC

## 2020-11-25 NOTE — Plan of Care (Signed)
  Problem: Clinical Measurements: Goal: Respiratory complications will improve Outcome: Progressing Goal: Cardiovascular complication will be avoided Outcome: Progressing   Problem: Pain Managment: Goal: General experience of comfort will improve Outcome: Progressing   Problem: Safety: Goal: Ability to remain free from injury will improve Outcome: Progressing   Problem: Skin Integrity: Goal: Risk for impaired skin integrity will decrease Outcome: Progressing

## 2020-11-25 NOTE — Plan of Care (Signed)
  Problem: Education: Goal: Knowledge of General Education information will improve Description: Including pain rating scale, medication(s)/side effects and non-pharmacologic comfort measures 11/25/2020 1107 by Threasa Heads, RN Outcome: Adequate for Discharge 11/25/2020 0856 by Threasa Heads, RN Outcome: Progressing   Problem: Health Behavior/Discharge Planning: Goal: Ability to manage health-related needs will improve 11/25/2020 1107 by Threasa Heads, RN Outcome: Adequate for Discharge 11/25/2020 0856 by Threasa Heads, RN Outcome: Progressing   Problem: Clinical Measurements: Goal: Ability to maintain clinical measurements within normal limits will improve 11/25/2020 1107 by Threasa Heads, RN Outcome: Adequate for Discharge 11/25/2020 0856 by Threasa Heads, RN Outcome: Progressing Goal: Will remain free from infection 11/25/2020 1107 by Threasa Heads, RN Outcome: Adequate for Discharge 11/25/2020 0856 by Threasa Heads, RN Outcome: Progressing Goal: Diagnostic test results will improve 11/25/2020 1107 by Threasa Heads, RN Outcome: Adequate for Discharge 11/25/2020 0856 by Threasa Heads, RN Outcome: Progressing Goal: Respiratory complications will improve 11/25/2020 1107 by Threasa Heads, RN Outcome: Adequate for Discharge 11/25/2020 0856 by Threasa Heads, RN Outcome: Progressing Goal: Cardiovascular complication will be avoided 11/25/2020 1107 by Threasa Heads, RN Outcome: Adequate for Discharge 11/25/2020 0856 by Threasa Heads, RN Outcome: Progressing   Problem: Activity: Goal: Risk for activity intolerance will decrease 11/25/2020 1107 by Threasa Heads, RN Outcome: Adequate for Discharge 11/25/2020 0856 by Threasa Heads, RN Outcome: Progressing   Problem: Elimination: Goal: Will not experience complications related to bowel motility 11/25/2020 1107 by Threasa Heads, RN Outcome: Adequate for Discharge 11/25/2020 0856 by Threasa Heads, RN Outcome: Progressing Goal: Will not experience  complications related to urinary retention 11/25/2020 1107 by Threasa Heads, RN Outcome: Adequate for Discharge 11/25/2020 0856 by Threasa Heads, RN Outcome: Progressing   Problem: Pain Managment: Goal: General experience of comfort will improve 11/25/2020 1107 by Threasa Heads, RN Outcome: Adequate for Discharge 11/25/2020 0856 by Threasa Heads, RN Outcome: Progressing   Problem: Safety: Goal: Ability to remain free from injury will improve 11/25/2020 1107 by Threasa Heads, RN Outcome: Adequate for Discharge 11/25/2020 0856 by Threasa Heads, RN Outcome: Progressing   Problem: Skin Integrity: Goal: Risk for impaired skin integrity will decrease 11/25/2020 1107 by Threasa Heads, RN Outcome: Adequate for Discharge 11/25/2020 0856 by Threasa Heads, RN Outcome: Progressing

## 2020-11-25 NOTE — Plan of Care (Signed)
  Problem: Education: Goal: Knowledge of General Education information will improve Description: Including pain rating scale, medication(s)/side effects and non-pharmacologic comfort measures Outcome: Progressing   Problem: Health Behavior/Discharge Planning: Goal: Ability to manage health-related needs will improve Outcome: Progressing   Problem: Clinical Measurements: Goal: Ability to maintain clinical measurements within normal limits will improve Outcome: Progressing Goal: Will remain free from infection Outcome: Progressing Goal: Diagnostic test results will improve Outcome: Progressing Goal: Respiratory complications will improve Outcome: Progressing Goal: Cardiovascular complication will be avoided Outcome: Progressing   Problem: Elimination: Goal: Will not experience complications related to bowel motility Outcome: Progressing Goal: Will not experience complications related to urinary retention Outcome: Progressing   Problem: Activity: Goal: Risk for activity intolerance will decrease Outcome: Progressing   Problem: Pain Managment: Goal: General experience of comfort will improve Outcome: Progressing   Problem: Safety: Goal: Ability to remain free from injury will improve Outcome: Progressing   Problem: Skin Integrity: Goal: Risk for impaired skin integrity will decrease Outcome: Progressing

## 2020-11-25 NOTE — Discharge Summary (Signed)
Physician Discharge Summary  [name] PPJ:[id number] DOB: 07-09-1940 DOA: 11/20/2020  PCP: Bill Pal, DO  Admit date: 11/20/2020 Discharge date: 11/25/2020  Time spent: 55 minutes  Recommendations for Outpatient Follow-up:  Follow-up with Bill Pal, DO in 2 weeks.  On follow-up patient will need a basic metabolic profile done to follow-up on electrolytes renal function, CBC done to follow-up on H&H.  Patient will need blood pressure reassessed. Follow-up with Dr. Silas Fox, pulmonary on 02/13/2021 at 1:30 PM for follow-up on saddle PE.  On follow-up patient will need repeat 2D echo done.   Discharge Diagnoses:  Principal Problem:   Bilateral pulmonary embolism (HCC) Active Problems:   Morbid obesity ([place])   Essential hypertension   Cor pulmonale, acute (HCC)   Thrombocytopenia (HCC)   NSTEMI (non-ST elevated myocardial infarction) ([place])   Ulcerative colitis ([place])   DVT (deep venous thrombosis) [facility]): Age indeterminant left femoral vein   Acute saddle pulmonary embolism ([place])   Discharge Condition: Stable and improved  Diet recommendation: Heart healthy  Filed Weights   11/20/20 1413 11/21/20 0254  Weight: 114.3 kg 114.5 kg    History of present illness:  HPI per Dr Tonie Griffith Bill Fox is a 80 y.o. male with medical history significant for HTN, DM, Ulcerative Colitis, hx of PE and not on current anticoagulation who presents for evaluation of dyspnea and left shoulder pain.  He reports he has been having worsening dyspnea for the last few weeks.  He states that he woke up this morning with increased shortness of breath that has gotten much worse and is occurring at rest as well as with ambulation and exertion.  He reports he also noticed that he had some pain in his left shoulder blade region today that started when he was having increased shortness of breath.  He has not had any fever or chills.  He has not had any cough.  With his shortness of  breath occurring with even minimal exertion he became very concerned and went to the local fire department where he knows the fireman and paramedics and they checked his oxygen saturation and found to be 75% on room air and his heart rate was in the 150s so EMS was called.  He was brought to the emergency room on a nonrebreather and given nebulizer breathing treatments and Solu-Medrol.  In the emergency room he has been weaned to a nasal cannula and is maintaining his oxygen saturations.  He states that he feels better after just sitting here for a while.  He denies any recent prolonged immobilization or travel.  States he does not drive around in [place] in the country for relaxation but he does not go for more than 1 to 2 hours he does get out and walk around at times when he does this.  He has not had any medication changes recently.  He does have a history of ulcerative colitis and has been on prednisone 40 mg a day for the last 3 weeks per his gastroenterologist. He lives alone.  He has a history of smoking but states he quit 1988.  He denies alcohol or illicit drug use.   ED Course: In the emergency room he is remained hemodynamically stable.  Initial troponin was 205 and increased to 284 a few hours later.  He did not have any acute ischemia on EKG.  D-dimer was greater than 20.  CT angiography of the chest revealed extensive pulmonary embolism causing right heart strain with  an RV to LV ratio 1.7.  CBC shows WBC of 9600, hemoglobin 14.9, hematocrit 45.3, platelet 218,000.  CMP shows a sodium 135, potassium 4.0, chloride 101, bicarb 22, creatinine 1.05, BUN 19, glucose 134, anion gap 12, LFTs normal.  COVID-19 swab is negative.  Influenza A and B swab are negative.  Patient was started on heparin infusion in the emergency room.  Hospitalist service was asked to admit for further management.  ER physician did discuss with PCCM and recommended staying on heparin and if patient decompensates clinically then  would need tPA and they would be glad to formally consult at that time.  Hospital Course:  1 acute extensive bilateral PE with right heart strain/large saddle embolus/age-indeterminate left femoral vein DVT/acute respiratory failure with hypoxia -Patient presented with worsening shortness of breath at rest and on exertion. -CT angiogram chest showed large saddle embolus with extension into the lobar pulmonary arteries and most segmental arteries, right heart strain submassive PE.  Patchy infiltration of atelectasis in the posterior lungs, cholelithiasis. -2D echo done with right ventricular systolic function mildly reduced, basal function is preserved but McConnell sign noted.  Left ventricular EF 50 to 26%,ZTIW, grade 1 diastolic dysfunction. -Lower extremity Dopplers with age-indeterminate left femoral vein DVT. -Patient placed on IV heparin improved clinically during the hospitalization such that by day of discharge patient with sats ranging from 93 to 96% on room air. -Systolic blood pressure improved.   -Patient was subsequently transitioned from IV heparin to Eliquis which he tolerated.   -Due to severity and extensive saddle PE with right ventricular strain, abnormal 2D echo, elevated troponins PCCM was consulted who assessed the patient and felt as patient was symptomatically improving on heparin no role for other interventions at this time.  -PCCM to arrange outpatient pulmonary follow-up and repeat 2D echo (83-month.    2.  Demand ischemia/elevated troponin/NSTEMI -Felt likely secondary to problem #1, acute large saddle PE. -BNP 42.9. -2D echo with left ventricular EF of 50 to 558%,KDXI grade 1 diastolic dysfunction, right ventricular systolic function mildly reduced, McConnell sign noted. -Was on IV heparin and transitioned to Eliquis.   -Wiill need repeat 2D echo in 3 months per pulmonary.    3.  Hypertension -Blood pressure was controlled on home regimen of Cardura and patient  started on low-dose Norvasc 2.5 mg daily.   -Outpatient follow-up with PCP.   4.  History of ulcerative colitis -Patient maintained on home regimen of prednisone 40 mg daily, Pentasa.   -Outpatient follow-up with GI as previously scheduled.    5.  Thrombocytopenia -No overt bleeding.   -Platelet count improved during the hospitalization and thrombocytopenia had resolved by day of discharge.  Platelet count on day of discharge was 186.  6.  Obesity -Estimated BMI 34.24 kg per metered squared.  7.  Hyperkalemia -Resolved.  Potassium of 4.4.    Procedures: CT head 11/22/2020 CT angiogram chest 11/20/2020 Chest x-ray 11/20/2020 2D echo 11/21/2020 Lower extremity Doppler 11/21/2020    Consultations: PCCM: Dr. HSilas Flood7/10/2020  Discharge Exam: Vitals:   11/24/20 2151 11/25/20 0445  BP: 134/82 117/81  Pulse: 78 71  Resp: 20 18  Temp: 97.7 F (36.5 C) (!) 97.5 F (36.4 C)  SpO2: 96% 93%    General: NAD Cardiovascular: RRR no murmurs rubs or gallops.  No JVD.  No lower extremity edema. Respiratory: CTA B.  No wheezes, no crackles, no rhonchi.  Normal respiratory effort.  Discharge Instructions   Discharge Instructions  Diet - low sodium heart healthy   Complete by: As directed    Increase activity slowly   Complete by: As directed       Allergies as of 11/25/2020   No Known Allergies      Medication List     TAKE these medications    amLODipine 2.5 MG tablet Commonly known as: NORVASC Take 1 tablet (2.5 mg total) by mouth daily. Start taking on: November 26, 2020   apixaban 5 MG Tabs tablet Commonly known as: ELIQUIS Take 2 tablets (10 mg total) by mouth 2 (two) times daily. Take 2 tablets (10 mg) by mouth 2 times daily x5 days, then 1 tablet (24m) by mouth 2 times daily thereafter.   doxazosin 8 MG tablet Commonly known as: CARDURA Take 0.5 tablets (4 mg total) by mouth at bedtime. What changed: See the new instructions.   mesalamine 0.375 g 24 hr  capsule Commonly known as: Apriso Take 4 capsules (1.5 g total) by mouth daily.   mirtazapine 30 MG tablet Commonly known as: REMERON Take 1 tablet (30 mg total) by mouth at bedtime.   predniSONE 10 MG tablet Commonly known as: DELTASONE Take 4 tablets (40 mg total) by mouth daily with breakfast. Take 479mdaily for 1 week, then 3032maily for 1 week, then 14m84mily for 1 week, then 10mg43mly for 1 week, then Stop       No Known Allergies  [facility] Schedule an appointment as soon as possible for a visit in 2 week(s).   Specialty: Family Medicine Contact information: [address] [phone]    HunsuLanier ClamFollow up on 02/13/2021.   Specialty: Pulmonary Disease Why: Follow-up as scheduled at 1:30 PM. Contact information: [id number]             The results of significant diagnostics from this hospitalization (including imaging, microbiology, ancillary and laboratory) are listed below for reference.    Significant Diagnostic Studies: CT HEAD WO CONTRAST  Result Date: 11/22/2020 CLINICAL DATA:  Sudden onset confusion. EXAM: CT HEAD WITHOUT CONTRAST TECHNIQUE: Contiguous axial images were obtained from the base of the skull through the vertex without intravenous contrast. COMPARISON:  12/12/2017 FINDINGS: Brain: Mild diffuse cerebral atrophy. Ventricular dilatation consistent with central atrophy. Low-attenuation changes in the deep white matter consistent with small vessel ischemia. Old lacunar infarcts in the basal ganglia. No mass effect or midline shift. No abnormal extra-axial fluid collections. Gray-white matter junctions are distinct. Basal cisterns are not effaced. No acute intracranial hemorrhage. Vascular: Moderate intracranial arterial vascular calcifications. Skull: Calvarium appears intact.  Sinuses/Orbits: Mucosal thickening in the maxillary antra. No acute air-fluid levels. Mastoid air cells are clear. Other: No significant changes since the previous study. Mild interval progression of atrophic changes. IMPRESSION: No acute intracranial abnormalities. Chronic atrophy and small vessel ischemic change. Electronically Signed   By: WilliLucienne Capers   On: 11/22/2020 03:52   CT Angio Chest PE W/Cm &/Or Wo Cm  Result Date: 11/20/2020 CLINICAL DATA:  Shortness of breath for 3 weeks. History of blood clots. Pulmonary embolus suspected with high probability. EXAM: CT ANGIOGRAPHY CHEST WITH CONTRAST TECHNIQUE: Multidetector CT imaging of the chest was performed using the standard protocol during bolus administration of intravenous contrast. Multiplanar CT image reconstructions and MIPs were  obtained to evaluate the vascular anatomy. CONTRAST:  55m OMNIPAQUE IOHEXOL 350 MG/ML SOLN COMPARISON:  Chest radiograph 11/20/2020.  CT chest 02/06/2015 FINDINGS: Cardiovascular: Good opacification of the central and segmental pulmonary arteries. Multiple filling defects demonstrated in the main, lobar, and nearly all proximal segmental pulmonary arteries. Changes are consistent with acute pulmonary embolus with large clot burden. RV to LV ratio is elevated at 1.7 suggesting evidence of right heart strain. No pericardial effusion. Normal caliber thoracic aorta. No aortic dissection. Mediastinum/Nodes: Calcified lymph nodes in the mediastinum. No significant lymphadenopathy. Esophagus is decompressed. Thyroid gland is unremarkable. Lungs/Pleura: Motion artifact limits examination. Patchy peripheral infiltration or atelectasis. Airways are patent. No pleural effusions. No pneumothorax. Upper Abdomen: Cholelithiasis. No evidence of cholecystitis. Calcified granulomas in the spleen. Musculoskeletal: Degenerative changes in the spine. Review of the MIP images confirms the above findings. IMPRESSION: 1. Positive for acute  pulmonary embolus with large saddle embolus and extension into lobar pulmonary arteries and most segmental arteries. Positive for acute PE with CT evidence of right heart strain (RV/LV Ratio = 1.7) consistent with at least submassive (intermediate risk) PE. The presence of right heart strain has been associated with an increased risk of morbidity and mortality. Please refer to the "PE Focused" order set in EPIC. 2. Patchy infiltration or atelectasis in the posterior lungs. 3. Cholelithiasis. Critical Value/emergent results were called by telephone at the time of interpretation on 11/20/2020 at 11:00 pm to provider [facility], who verbally acknowledged these results. Electronically Signed   By: WLucienne CapersM.D.   On: 11/20/2020 23:06   DG Chest Port 1 View  Result Date: 11/20/2020 CLINICAL DATA:  Shoulder blade pain. EXAM: PORTABLE CHEST 1 VIEW COMPARISON:  01/02/2017 FINDINGS: Single view of the chest demonstrates mild haziness in the left lower chest which could be related to atelectasis. Otherwise, the lungs are clear. Slightly low lung volumes. Heart size is within normal limits. Trachea is midline. Negative for a pneumothorax. Degenerative changes at both [facility]. IMPRESSION: Low lung volumes with probable atelectasis in the left lower chest. Electronically Signed   By: AMarkus DaftM.D.   On: 11/20/2020 15:36   ECHOCARDIOGRAM COMPLETE  Result Date: 11/21/2020    ECHOCARDIOGRAM REPORT   Patient Name:   Bill LUSKDate of Exam: 11/21/2020 Medical Rec #:  [id number]       Height:       72.0 in Accession #:    [id number]      Weight:       252.4 lb Date of Birth:  [birth year]       BSA:          2.352 m Patient Age:    89years         BP:           118/67 mmHg Patient Gender: M                HR:           59 bpm. Exam Location:  Inpatient Procedure: 2D Echo, Cardiac Doppler and Color Doppler Indications:    Pulmonary embolus  History:        Patient has no prior history of Echocardiogram  examinations.                 Signs/Symptoms:Dyspnea and Pul.embolus; Risk                 Factors:Hypertension, Diabetes, Dyslipidemia, Former Smoker and  Morbid obesity.  Sonographer:    Dustin Flock Referring Phys: [id number] Eben Burow  Sonographer Comments: Patient is morbidly obese. Image acquisition challenging due to respiratory motion and Image acquisition challenging due to patient body habitus. IMPRESSIONS  1. Right ventricular systolic function is mildly reduced-basal function is preserved but     McConnell's Sign is noted. This can be seen in pulmonary embolism and other forms of right heart strain. The right ventricular size is normal.  2. Left ventricular ejection fraction, by estimation, is 50 to 55%. The left ventricle has low normal function. The left ventricle has no regional wall motion abnormalities. Left ventricular diastolic parameters are consistent with Grade I diastolic dysfunction (impaired relaxation).  3. The mitral valve is grossly normal. No evidence of mitral valve regurgitation. No evidence of mitral stenosis. Moderate mitral annular calcification.  4. The aortic valve was not well visualized. There is mild calcification of the aortic valve. There is mild thickening of the aortic valve. Aortic valve regurgitation is not visualized. No aortic stenosis is present. Comparison(s): No prior Echocardiogram. FINDINGS  Left Ventricle: Left ventricular ejection fraction, by estimation, is 50 to 55%. The left ventricle has low normal function. The left ventricle has no regional wall motion abnormalities. The left ventricular internal cavity size was normal in size. There is no left ventricular hypertrophy. Left ventricular diastolic parameters are consistent with Grade I diastolic dysfunction (impaired relaxation). Right Ventricle: The right ventricular size is normal. No increase in right ventricular wall thickness. Right ventricular systolic function is mildly  reduced. Left Atrium: Left atrial size was normal in size. Right Atrium: Right atrial size was normal in size. Pericardium: There is no evidence of pericardial effusion. Mitral Valve: The mitral valve is grossly normal. Moderate mitral annular calcification. No evidence of mitral valve regurgitation. No evidence of mitral valve stenosis. Tricuspid Valve: The tricuspid valve is normal in structure. Tricuspid valve regurgitation is trivial. No evidence of tricuspid stenosis. Aortic Valve: The aortic valve was not well visualized. There is mild calcification of the aortic valve. There is mild thickening of the aortic valve. Aortic valve regurgitation is not visualized. No aortic stenosis is present. Pulmonic Valve: The pulmonic valve was not well visualized. Pulmonic valve regurgitation is not visualized. No evidence of pulmonic stenosis. Aorta: The aortic root is normal in size and structure. IAS/Shunts: The atrial septum is grossly normal.  LEFT VENTRICLE PLAX 2D LVIDd:         5.10 cm Diastology LVIDs:         3.10 cm LV e' medial:    7.18 cm/s LV PW:         1.10 cm LV E/e' medial:  8.8 LV IVS:        1.00 cm LV e' lateral:   6.53 cm/s                        LV E/e' lateral: 9.7  RIGHT VENTRICLE RV Basal diam:  3.80 cm RV S prime:     5.66 cm/s TAPSE (M-mode): 2.3 cm LEFT ATRIUM             Index       RIGHT ATRIUM           Index LA diam:        3.70 cm 1.57 cm/m  RA Area:     16.70 cm LA Vol (A2C):   28.8 ml 12.24 ml/m RA Volume:   42.80 ml  18.20 ml/m LA Vol (A4C):   41.8 ml 17.77 ml/m LA Biplane Vol: 37.9 ml 16.11 ml/m  AORTIC VALVE LVOT Vmax:   96.60 cm/s LVOT Vmean:  67.500 cm/s LVOT VTI:    0.213 m  AORTA Ao Root diam: 3.20 cm MITRAL VALVE MV Area (PHT): 3.00 cm    SHUNTS MV Decel Time: 253 msec    Systemic VTI: 0.21 m MV E velocity: 63.40 cm/s MV A velocity: 74.10 cm/s MV E/A ratio:  0.86 Rudean Haskell MD Electronically signed by Rudean Haskell MD Signature Date/Time: 11/21/2020/10:59:18  AM    Final    VAS [place] LOWER EXTREMITY VENOUS (DVT)  Result Date: 11/21/2020  Lower Venous DVT Study Patient Name:  MORLEY GAUMER  Date of Exam:   11/21/2020 Medical Rec #: [id number]         Accession #:    [id number] Date of Birth: 1940/06/13         Patient Gender: M Patient Age:   79Y Exam Location[facility]  [facility] Procedure:      VAS [place] LOWER EXTREMITY VENOUS (DVT) Referring Phys: [id number] RIPUDEEP K RAI --------------------------------------------------------------------------------  Indications: Pulmonary embolism.  Comparison Study: EMR show previous exam on 06/11/13 but unable to see results. Performing Technologist: Rogelia Rohrer RVT, RDMS  Examination Guidelines: A complete evaluation includes B-mode imaging, spectral Doppler, color Doppler, and power Doppler as needed of all accessible portions of each vessel. Bilateral testing is considered an integral part of a complete examination. Limited examinations for reoccurring indications may be performed as noted. The reflux portion of the exam is performed with the patient in reverse Trendelenburg.  +---------+---------------+---------+-----------+----------+--------------+ RIGHT    CompressibilityPhasicitySpontaneityPropertiesThrombus Aging +---------+---------------+---------+-----------+----------+--------------+ CFV      Full           Yes      Yes                                 +---------+---------------+---------+-----------+----------+--------------+ SFJ      Full                                                        +---------+---------------+---------+-----------+----------+--------------+ FV Prox  Full           Yes      Yes                                 +---------+---------------+---------+-----------+----------+--------------+ FV Mid   Full           Yes      Yes                                 +---------+---------------+---------+-----------+----------+--------------+ FV DistalFull           Yes      Yes                                  +---------+---------------+---------+-----------+----------+--------------+ PFV      Full                                                        +---------+---------------+---------+-----------+----------+--------------+  POP      Full           Yes      Yes                                 +---------+---------------+---------+-----------+----------+--------------+ PTV      Full                                                        +---------+---------------+---------+-----------+----------+--------------+ PERO     Full                                                        +---------+---------------+---------+-----------+----------+--------------+   +---------+---------------+---------+-----------+----------+-----------------+ LEFT     CompressibilityPhasicitySpontaneityPropertiesThrombus Aging    +---------+---------------+---------+-----------+----------+-----------------+ CFV      Full           Yes      Yes                                    +---------+---------------+---------+-----------+----------+-----------------+ SFJ      Full                                                           +---------+---------------+---------+-----------+----------+-----------------+ FV Prox  Partial        Yes      Yes                  Age Indeterminate +---------+---------------+---------+-----------+----------+-----------------+ FV Mid   Partial        Yes      Yes                  Age Indeterminate +---------+---------------+---------+-----------+----------+-----------------+ FV DistalFull           Yes      Yes                                    +---------+---------------+---------+-----------+----------+-----------------+ PFV      Full                                                           +---------+---------------+---------+-----------+----------+-----------------+ POP      Full           Yes      Yes                                     +---------+---------------+---------+-----------+----------+-----------------+ PTV      Full                                                           +---------+---------------+---------+-----------+----------+-----------------+  PERO     Full                                                           +---------+---------------+---------+-----------+----------+-----------------+     Summary: BILATERAL: -No evidence of popliteal cyst, bilaterally. RIGHT: - There is no evidence of deep vein thrombosis in the lower extremity. - There is no evidence of superficial venous thrombosis.  LEFT: - Findings consistent with age indeterminate deep vein thrombosis involving the left femoral vein. - There is no evidence of superficial venous thrombosis.  *See table(s) above for measurements and observations. Electronically signed by Ruta Hinds MD on 11/21/2020 at 3:47:39 PM.    Final     Microbiology: Recent Results (from the past 240 hour(s))  Resp Panel by RT-PCR (Flu A&B, Covid) Nasopharyngeal Swab     Status: None   Collection Time: 11/20/20  4:08 PM   Specimen: Nasopharyngeal Swab; Nasopharyngeal(NP) swabs in vial transport medium  Result Value Ref Range Status   SARS Coronavirus 2 by RT PCR NEGATIVE NEGATIVE Final    Comment: (NOTE) SARS-CoV-2 target nucleic acids are NOT DETECTED.  The SARS-CoV-2 RNA is generally detectable in upper respiratory specimens during the acute phase of infection. The lowest concentration of SARS-CoV-2 viral copies this assay can detect is 138 copies/mL. A negative result does not preclude SARS-Cov-2 infection and should not be used as the sole basis for treatment or other patient management decisions. A negative result may occur with  improper specimen collection/handling, submission of specimen other than nasopharyngeal swab, presence of viral mutation(s) within the areas targeted by this assay, and inadequate number of  viral copies(<138 copies/mL). A negative result must be combined with clinical observations, patient history, and epidemiological information. The expected result is Negative.  Fact Sheet for Patients:  EntrepreneurPulse.com.au  Fact Sheet for Healthcare Providers:  IncredibleEmployment.be  This test is no t yet approved or cleared by the [place] FDA and  has been authorized for detection and/or diagnosis of SARS-CoV-2 by FDA under an Emergency Use Authorization (EUA). This EUA will remain  in effect (meaning this test can be used) for the duration of the COVID-19 declaration under Section 564(b)(1) of the Act, 21 U.S.C.section 360bbb-3(b)(1), unless the authorization is terminated  or revoked sooner.       Influenza A by PCR NEGATIVE NEGATIVE Final   Influenza B by PCR NEGATIVE NEGATIVE Final    Comment: (NOTE) The Xpert Xpress SARS-CoV-2/FLU/RSV plus assay is intended as an aid in the diagnosis of influenza from Nasopharyngeal swab specimens and should not be used as a sole basis for treatment. Nasal washings and aspirates are unacceptable for Xpert Xpress SARS-CoV-2/FLU/RSV testing.  Fact Sheet for Patients: EntrepreneurPulse.com.au  Fact Sheet for Healthcare Providers: IncredibleEmployment.be  This test is not yet approved or cleared by the [place] FDA and has been authorized for detection and/or diagnosis of SARS-CoV-2 by FDA under an Emergency Use Authorization (EUA). This EUA will remain in effect (meaning this test can be used) for the duration of the COVID-19 declaration under Section 564(b)(1) of the Act, 21 U.S.C. section 360bbb-3(b)(1), unless the authorization is terminated or revoked.  Performed at [facility], [place] [address] [place], [place] [address]   MRSA Next Gen by PCR, Nasal     Status: None  Collection Time: 11/21/20  3:01 AM   Specimen: Nasal  Mucosa; Nasal Swab  Result Value Ref Range Status   MRSA by PCR Next Gen NOT DETECTED NOT DETECTED Final    Comment: (NOTE) The GeneXpert MRSA Assay (FDA approved for NASAL specimens only), is one component of a comprehensive MRSA colonization surveillance program. It is not intended to diagnose MRSA infection nor to guide or monitor treatment for MRSA infections. Test performance is not FDA approved in patients less than 31 years old. Performed at [facility], [place] [address], [place], [place] [address]      Labs: Basic Metabolic Panel: Recent Labs  Lab 11/21/20 0409 11/22/20 0859 11/22/20 1042 11/23/20 0304 11/24/20 0449 11/25/20 0426  NA 135 134*  --  136 137 134*  K 4.1 6.0* 4.2 4.9 4.4 4.2  CL 102 104  --  106 106 103  CO2 22 23  --  22 21* 20*  GLUCOSE 127* 116*  --  127* 116* 123*  BUN 19 23  --  23 23 24*  CREATININE 1.00 0.90  --  1.10 0.89 1.11  CALCIUM 8.7* 8.3*  --  8.2* 8.8* 9.1  MG  --  2.3  --   --   --   --    Liver Function Tests: Recent Labs  Lab 11/20/20 1608  AST 25  ALT 32  ALKPHOS 45  BILITOT 0.8  PROT 7.1  ALBUMIN 3.2*   No results for input(s): LIPASE, AMYLASE in the last 168 hours. No results for input(s): AMMONIA in the last 168 hours. CBC: Recent Labs  Lab 11/20/20 1608 11/21/20 0409 11/21/20 0907 11/22/20 0240 11/23/20 0304 11/24/20 0449 11/25/20 0426  WBC 9.6   < > 11.3* 7.5 6.7 7.9 8.6  NEUTROABS 8.5*  --   --   --   --   --   --   HGB 14.9   < > 15.3 14.4 14.3 15.1 16.2  HCT 45.3   < > 47.1 45.4 44.9 47.5 50.0  MCV 87.5   < > 86.9 88.8 88.0 89.0 87.1  PLT 118*   < > PLATELET CLUMPS NOTED ON SMEAR, UNABLE TO ESTIMATE 121* 131* 148* 186   < > = values in this interval not displayed.   Cardiac Enzymes: No results for input(s): CKTOTAL, CKMB, CKMBINDEX, TROPONINI in the last 168 hours. BNP: BNP (last 3 results) Recent Labs    11/20/20 1608  BNP 42.9    ProBNP (last 3 results) No results for  input(s): PROBNP in the last 8760 hours.  CBG: Recent Labs  Lab 11/24/20 0748 11/24/20 1125 11/24/20 1637 11/24/20 2151 11/25/20 0805  GLUCAP 114* 173* 132* 141* 119*       Signed:  Irine Seal MD.  [facility] 11/25/2020, 10:58 AM

## 2020-11-25 NOTE — Progress Notes (Signed)
Discharge paperwork given and explained to patient utilizing the TeachBack Method. PIV removed. Pressure dressing applied. Pt awaiting transport.

## 2020-11-27 ENCOUNTER — Telehealth: Payer: Self-pay

## 2020-11-27 ENCOUNTER — Telehealth: Payer: Self-pay | Admitting: Internal Medicine

## 2020-11-27 NOTE — Telephone Encounter (Signed)
Transition Care Management Follow-up Telephone Call Date of discharge and from where: 11/25/2020-[place] How have you been since you were released from the hospital? So far so good Any questions or concerns? No  Items Reviewed: Did the pt receive and understand the discharge instructions provided? Yes  Medications obtained and verified? Yes  Other? Yes  Any new allergies since your discharge? No  Dietary orders reviewed? Yes Do you have support at home? Yes   Home Care and Equipment/Supplies: Were home health services ordered? no If so, what is the name of the agency? N/a  Has the agency set up a time to come to the patient's home? not applicable Were any new equipment or medical supplies ordered?  No What is the name of the medical supply agency? N/a Were you able to get the supplies/equipment? no Do you have any questions related to the use of the equipment or supplies? N/a  Functional Questionnaire: (I = Independent and D = Dependent) ADLs: I  Bathing/Dressing- I  Meal Prep- I  Eating- I  Maintaining continence- I  Transferring/Ambulation- I  Managing Meds- I  Follow up appointments reviewed:  PCP Hospital f/u appt confirmed? Yes  Scheduled to see Dr. Nani Ravens on 11/29/2020 @ 11:00. [facility] f/u appt confirmed? Yes  Scheduled to see Dr. Silas Flood on 02/13/2021 @ 1:30. Are transportation arrangements needed? No  If their condition worsens, is the pt aware to call PCP or go to the Emergency Dept.? Yes Was the patient provided with contact information for the PCP's office or ED? Yes Was to pt encouraged to call back with questions or concerns? Yes

## 2020-11-27 NOTE — Telephone Encounter (Signed)
Patient was informed that the labs from the hospital were normal per Rosanne Sack RN and was advised to follow up with Dr. Henrene Pastor on his scheduled office visit 12/26/20.

## 2020-11-29 ENCOUNTER — Ambulatory Visit (INDEPENDENT_AMBULATORY_CARE_PROVIDER_SITE_OTHER): Payer: Medicare Other | Admitting: Family Medicine

## 2020-11-29 ENCOUNTER — Encounter: Payer: Self-pay | Admitting: Family Medicine

## 2020-11-29 ENCOUNTER — Other Ambulatory Visit: Payer: Self-pay

## 2020-11-29 VITALS — BP 138/88 | HR 87 | Temp 98.2°F | Ht 71.0 in | Wt 245.4 lb

## 2020-11-29 DIAGNOSIS — I10 Essential (primary) hypertension: Secondary | ICD-10-CM | POA: Diagnosis not present

## 2020-11-29 DIAGNOSIS — I2602 Saddle embolus of pulmonary artery with acute cor pulmonale: Secondary | ICD-10-CM

## 2020-11-29 LAB — BASIC METABOLIC PANEL
BUN: 21 mg/dL (ref 6–23)
CO2: 22 mEq/L (ref 19–32)
Calcium: 8.9 mg/dL (ref 8.4–10.5)
Chloride: 101 mEq/L (ref 96–112)
Creatinine, Ser: 1.23 mg/dL (ref 0.40–1.50)
GFR: 56.55 mL/min — ABNORMAL LOW (ref >60.00)
Glucose, Bld: 96 mg/dL (ref 70–99)
Potassium: 4.7 mEq/L (ref 3.5–5.1)
Sodium: 133 mEq/L — ABNORMAL LOW (ref 135–145)

## 2020-11-29 NOTE — Progress Notes (Signed)
Chief Complaint  Patient presents with   Hospitalization Follow-up   Cough    Sore throat     Subjective: Patient is a 80 y.o. male here for hosp f/u.  Patient was admitted on 7/4 and discharged on 7/9 from [facility].  He was diagnosed with acute respiratory failure after sustaining a saddle pulmonary embolism.  He was started on heparin drip and transition to Eliquis.  He reports compliance with this medication and no adverse effects.  He has a follow-up with the pulmonology team on 02/13/21.  Breathing is better.  He is eating and drinking normally.  He has blood in the stool but he is also undergoing an ulcerative colitis flare.  Patient's blood pressure was elevated in the hospital.  He was started on Norvasc 2.5 mg daily.  He is compliant with this medication and reports no adverse effects.  He has not been checking his blood pressure at home.  He has been compliant with his Cardura 4 mg nightly as well.  No chest pain or shortness of breath.  Past Medical History:  Diagnosis Date   Anxiety    At risk for sleep apnea    STOP-BANG= 5     SENT TO PCP 11-29-2013   Cataract    Chronic fatigue    DJD (degenerative joint disease)    RIGHT KNEE   History of colon polyps    2011  &  2013 --  ADENOMATOUS   History of pulmonary embolism    Hyperlipidemia    pt denies ever having high chol   Hypertension    Right knee meniscal tear    Ulcerative colitis     Objective: BP 138/88   Pulse 87   Temp 98.2 F (36.8 C) (Oral)   Ht 5' 11"  (1.803 m)   Wt 245 lb 6 oz (111.3 kg)   SpO2 95%   BMI 34.22 kg/m  General: Awake, appears stated age HEENT: MMM, EOMi MSK: No calf ttp b/l Heart: RRR, no LE edema Lungs: CTAB, no rales, wheezes or rhonchi. No accessory muscle use Psych: Age appropriate judgment and insight, normal affect and mood  Assessment and Plan: Acute saddle pulmonary embolism with acute cor pulmonale (HCC) - Plan: CBC, Basic metabolic panel  Essential  hypertension  Cont Eliquis. Ck labs.  HTN- well controlled currently. Goal is <150/90. Cont Cardura and Norvasc 2.5 mg/d.  F/u in 6 mo for med ck or prn.  The patient voiced understanding and agreement to the plan.  I spent around 35 minutes with the patient and reviewing his chart on the same day of visit.  [place], DO 11/29/20  12:00 PM

## 2020-11-29 NOTE — Patient Instructions (Signed)
Aim to do some physical exertion for 150 minutes per week. This is typically divided into 5 days per week, 30 minutes per day. The activity should be enough to get your heart rate up. Anything is better than nothing if you have time constraints.  Give [place] 2-3 business days to get the results of your labs back.   Keep the diet clean and stay active.  Let us know if you need anything.

## 2020-11-30 LAB — CBC
HCT: 45.6 % (ref 39.0–52.0)
Hemoglobin: 15.3 g/dL (ref 13.0–17.0)
MCHC: 33.6 g/dL (ref 30.0–36.0)
MCV: 85.2 fl (ref 78.0–100.0)
Platelets: 262 10*3/uL (ref 150.0–400.0)
RBC: 5.35 Mil/uL (ref 4.22–5.81)
RDW: 14.7 % (ref 11.5–15.5)
WBC: 8.6 10*3/uL (ref 4.0–10.5)

## 2020-12-14 ENCOUNTER — Other Ambulatory Visit (HOSPITAL_COMMUNITY): Payer: Medicare Other

## 2020-12-18 ENCOUNTER — Telehealth: Payer: Self-pay | Admitting: Internal Medicine

## 2020-12-18 DIAGNOSIS — E785 Hyperlipidemia, unspecified: Secondary | ICD-10-CM

## 2020-12-18 DIAGNOSIS — K51919 Ulcerative colitis, unspecified with unspecified complications: Secondary | ICD-10-CM

## 2020-12-18 DIAGNOSIS — R197 Diarrhea, unspecified: Secondary | ICD-10-CM

## 2020-12-18 NOTE — Telephone Encounter (Signed)
Inbound call from patient. States prednisone and mesalamine is not working for him. Best contact number [phone]

## 2020-12-19 NOTE — Telephone Encounter (Signed)
Left message on machine to call back  

## 2020-12-19 NOTE — Telephone Encounter (Signed)
Pt called again stating that medications mentioned below are not working for him so he would like some advise as soon as possible.

## 2020-12-20 ENCOUNTER — Other Ambulatory Visit (INDEPENDENT_AMBULATORY_CARE_PROVIDER_SITE_OTHER): Payer: Medicare Other

## 2020-12-20 DIAGNOSIS — K51919 Ulcerative colitis, unspecified with unspecified complications: Secondary | ICD-10-CM | POA: Diagnosis not present

## 2020-12-20 DIAGNOSIS — R197 Diarrhea, unspecified: Secondary | ICD-10-CM | POA: Diagnosis not present

## 2020-12-20 DIAGNOSIS — E785 Hyperlipidemia, unspecified: Secondary | ICD-10-CM | POA: Diagnosis not present

## 2020-12-20 LAB — HIGH SENSITIVITY CRP: CRP, High Sensitivity: 4.56 mg/L (ref 0.000–5.000)

## 2020-12-20 LAB — CBC WITH DIFFERENTIAL/PLATELET
Basophils Absolute: 0 10*3/uL (ref 0.0–0.1)
Basophils Relative: 0.4 % (ref 0.0–3.0)
Eosinophils Absolute: 0.1 10*3/uL (ref 0.0–0.7)
Eosinophils Relative: 1 % (ref 0.0–5.0)
HCT: 41.5 % (ref 39.0–52.0)
Hemoglobin: 13.6 g/dL (ref 13.0–17.0)
Lymphocytes Relative: 36.7 % (ref 12.0–46.0)
Lymphs Abs: 2.4 10*3/uL (ref 0.7–4.0)
MCHC: 32.8 g/dL (ref 30.0–36.0)
MCV: 87.9 fl (ref 78.0–100.0)
Monocytes Absolute: 0.8 10*3/uL (ref 0.1–1.0)
Monocytes Relative: 12.4 % — ABNORMAL HIGH (ref 3.0–12.0)
Neutro Abs: 3.2 10*3/uL (ref 1.4–7.7)
Neutrophils Relative %: 49.5 % (ref 43.0–77.0)
Platelets: 179 10*3/uL (ref 150.0–400.0)
RBC: 4.72 Mil/uL (ref 4.22–5.81)
RDW: 16.4 % — ABNORMAL HIGH (ref 11.5–15.5)
WBC: 6.5 10*3/uL (ref 4.0–10.5)

## 2020-12-20 LAB — COMPREHENSIVE METABOLIC PANEL
ALT: 15 U/L (ref 0–53)
AST: 10 U/L (ref 0–37)
Albumin: 3.5 g/dL (ref 3.5–5.2)
Alkaline Phosphatase: 59 U/L (ref 39–117)
BUN: 20 mg/dL (ref 6–23)
CO2: 26 mEq/L (ref 19–32)
Calcium: 9.1 mg/dL (ref 8.4–10.5)
Chloride: 106 mEq/L (ref 96–112)
Creatinine, Ser: 1.22 mg/dL (ref 0.40–1.50)
GFR: 57.09 mL/min — ABNORMAL LOW (ref >60.00)
Glucose, Bld: 109 mg/dL — ABNORMAL HIGH (ref 70–99)
Potassium: 4 mEq/L (ref 3.5–5.1)
Sodium: 141 mEq/L (ref 135–145)
Total Bilirubin: 0.4 mg/dL (ref 0.2–1.2)
Total Protein: 7.1 g/dL (ref 6.0–8.3)

## 2020-12-20 MED ORDER — PREDNISONE 10 MG PO TABS: 40.0000 mg | ORAL_TABLET | Freq: Every day | ORAL | 0 refills | Status: DC

## 2020-12-20 NOTE — Telephone Encounter (Signed)
Called placed to the pt and he will come in today for labs and to pick up stool test.  He will increase prednisone to 40 mg daily.  Refill has been sent. Labs and stool test ordered.  He will try imodium as directed.  The pt aware we will call him when results are received.

## 2020-12-20 NOTE — Telephone Encounter (Signed)
1.  Stat stool for C. difficile ASAP 2.  Increase prednisone to 40 mg daily 3.  Okay to use antidiarrheals unless otherwise directed 4.  CBC and comprehensive metabolic panel as well as CRP

## 2020-12-20 NOTE — Telephone Encounter (Signed)
The pt called with complaints of continued liquid, watery diarrhea with a history of left sided UC- He is taking Apriso 4 capsules daily and also prednisone taper currently on 20 mg daily. This is his second round of prednisone taper.  His previous Mesalamine 4 capsules daily worked well but with recent changes (apriso) its not working.  He believes the previous mesalamine was either delzicol or asacol but that is no longer being manufactured but that worked very well for years.  He has diarrhea about every hour including nocturnal episodes up to 8 times preventing him from sleeping.  The liquid stool occasionally contains blood and mucous. He has NO abd pain- no weight changes- no fever. He has appt on Aug 9 with Dr Henrene Pastor but tells me that he has a cardiology procedure (looks like echocardiogram) that is being postponed until he can get the diarrhea under control because he has to be still for at least an hour.  Dr Henrene Pastor please advise.

## 2020-12-21 ENCOUNTER — Telehealth: Payer: Self-pay | Admitting: Internal Medicine

## 2020-12-21 LAB — CLOSTRIDIUM DIFFICILE TOXIN B, QUALITATIVE, REAL-TIME PCR: Toxigenic C. Difficile by PCR: NOT DETECTED

## 2020-12-21 NOTE — Telephone Encounter (Signed)
Pt would like to speak with you about dose of Prednisone. He needs some clarification on how to take it.

## 2020-12-21 NOTE — Telephone Encounter (Signed)
The pt has been advised he should start his 40 mg prednisone now.  He will begin and continue until her sees Dr Henrene Pastor in the office for further instructions.

## 2020-12-25 ENCOUNTER — Other Ambulatory Visit: Payer: Self-pay | Admitting: Family Medicine

## 2020-12-25 MED ORDER — APIXABAN 5 MG PO TABS: 10.0000 mg | ORAL_TABLET | Freq: Two times a day (BID) | ORAL | 0 refills | Status: DC

## 2020-12-25 MED ORDER — AMLODIPINE BESYLATE 2.5 MG PO TABS: 2.5000 mg | ORAL_TABLET | Freq: Every day | ORAL | 0 refills | Status: DC

## 2020-12-26 ENCOUNTER — Encounter: Payer: Self-pay | Admitting: Internal Medicine

## 2020-12-26 ENCOUNTER — Ambulatory Visit (INDEPENDENT_AMBULATORY_CARE_PROVIDER_SITE_OTHER): Payer: Medicare Other | Admitting: Internal Medicine

## 2020-12-26 VITALS — BP 124/76 | HR 91 | Ht 71.0 in | Wt 257.0 lb

## 2020-12-26 DIAGNOSIS — E785 Hyperlipidemia, unspecified: Secondary | ICD-10-CM

## 2020-12-26 DIAGNOSIS — R197 Diarrhea, unspecified: Secondary | ICD-10-CM

## 2020-12-26 DIAGNOSIS — K51919 Ulcerative colitis, unspecified with unspecified complications: Secondary | ICD-10-CM

## 2020-12-26 MED ORDER — PREDNISONE 10 MG PO TABS: 40.0000 mg | ORAL_TABLET | Freq: Every day | ORAL | 3 refills | Status: DC

## 2020-12-26 NOTE — Progress Notes (Signed)
HISTORY OF PRESENT ILLNESS:  Bill Fox is a 80 y.o. male with left-sided ulcerative colitis.  Last colonoscopy January 2022 with chronic active colitis involving the distal 40 cm of the colon.  He was treated with a short course of steroids earlier this year and responded.  He has been on several mesalamine agent and since Delzicol was no longer his preferred formulary mesalamine agent.  I saw him August 22, 2020 at which time he was doing better.  Actually had some constipation.  Since that time he is contacted the office several times with complaints of loose stools and urgency.  Mostly small volume loose stools.  Recent blood work was unremarkable.  Stool for C. difficile was negative.  1 week ago he was placed on prednisone 40 mg daily.  He presents today for follow-up.  He states his problems are ongoing.  No abdominal pain.  He does see some blood.  No fevers.  It should be noted that he was hospitalized in early July with DVT and acute pulmonary embolus.  Now on chronic anticoagulation therapy.  REVIEW OF SYSTEMS:  All non-GI ROS negative unless otherwise stated in the HPI except for arthritis  Past Medical History:  Diagnosis Date   Anxiety    At risk for sleep apnea    STOP-BANG= 5     SENT TO PCP 11-29-2013   Cataract    Chronic fatigue    DJD (degenerative joint disease)    RIGHT KNEE   History of colon polyps    2011  &  2013 --  ADENOMATOUS   History of pulmonary embolism    Hyperlipidemia    pt denies ever having high chol   Hypertension    Right knee meniscal tear    Ulcerative colitis     Past Surgical History:  Procedure Laterality Date   CATARACT EXTRACTION W/ INTRAOCULAR LENS IMPLANT Left 2014   COLONOSCOPY W/ POLYPECTOMY  MULTIPLE--  LAST ONE 09-12-2011   KNEE ARTHROSCOPY WITH DRILLING/MICROFRACTURE Right 12/03/2013   Procedure: RIGHT KNEE ARTHROSCOPY PARTIAL MEDIAL MENISCECTOMY DEBRIDEMENT SYNOVECTOMY;  Surgeon: Johnn Hai, MD;  Location: [facility];  Service: Orthopedics;  Laterality: Right;   NASAL FRACTURE SURGERY  1985   TRANSTHORACIC ECHOCARDIOGRAM  11-01-2008   MILD LVH/  EF 58%/  GRADE I DIASTOLIC DYSFUNCTION/  MILD LAE    Social History Bill Fox  reports that he quit smoking about 27 years ago. His smoking use included cigarettes. He has a 30.00 pack-year smoking history. He has never used smokeless tobacco. He reports current alcohol use of about 1.0 standard drink of alcohol per week. He reports that he does not use drugs.  family history includes Diabetes in his mother and paternal uncle; Lung cancer in his brother; Stomach cancer in his father.  No Known Allergies     PHYSICAL EXAMINATION: Vital signs: BP 124/76   Pulse 91   Ht 5' 11"  (1.803 m)   Wt 257 lb (116.6 kg)   BMI 35.84 kg/m   Constitutional: Overweight but generally well-appearing, no acute distress Psychiatric: alert and oriented x3, cooperative, pleasant Eyes: extraocular movements intact, anicteric, conjunctiva pink Mouth: oral pharynx moist, no lesions Neck: supple no lymphadenopathy Cardiovascular: heart regular rate and rhythm, no murmur Lungs: clear to auscultation bilaterally Abdomen: soft, obese, nontender, nondistended, no obvious ascites, no peritoneal signs, normal bowel sounds, no organomegaly Rectal: Omitted Extremities: no lower extremity edema bilaterally Skin: no lesions on visible extremities Neuro: No focal  deficits.  Cranial nerves intact  ASSESSMENT:  1.  Left-sided ulcerative colitis.  Active flare of disease. 2.  Recent DVT with pulmonary embolus.  On chronic anticoagulation therapy   PLAN:  1.  Continue prednisone 40 mg daily.  We will continue him on this dosage until his follow-up.  Prescription refilled.  Medication risks reviewed 2.  Continue Apriso 1.5 g daily 3.  Discussed topical therapy with mesalamine enemas.  He wishes to hold off at this time 4.  Over-the-counter antidiarrheals 5.   Office follow-up in 4 to 5 weeks.  Contact the office in the interim for any questions or problems

## 2020-12-26 NOTE — Patient Instructions (Signed)
If you are age 78 or older, your body mass index should be between 23-30. Your Body mass index is 35.84 kg/m. If this is out of the aforementioned range listed, please consider follow up with your Primary Care Provider.  If you are age 6 or younger, your body mass index should be between 19-25. Your Body mass index is 35.84 kg/m. If this is out of the aformentioned range listed, please consider follow up with your Primary Care Provider.   __________________________________________________________  The [facility] providers would like to encourage you to use [facility] to communicate with providers for non-urgent requests or questions.  Due to long hold times on the telephone, sending your provider a message by [facility] may be a faster and more efficient way to get a response.  Please allow 48 business hours for a response.  Please remember that this is for non-urgent requests.   We have sent the following medications to your pharmacy for you to pick up at your convenience:  Prednisone

## 2020-12-27 DIAGNOSIS — B351 Tinea unguium: Secondary | ICD-10-CM | POA: Diagnosis not present

## 2020-12-27 DIAGNOSIS — M79673 Pain in unspecified foot: Secondary | ICD-10-CM | POA: Diagnosis not present

## 2021-01-01 ENCOUNTER — Other Ambulatory Visit: Payer: Self-pay

## 2021-01-01 ENCOUNTER — Other Ambulatory Visit: Payer: Self-pay | Admitting: Pulmonary Disease

## 2021-01-01 ENCOUNTER — Ambulatory Visit (HOSPITAL_COMMUNITY): Payer: Medicare Other | Attending: Internal Medicine

## 2021-01-01 DIAGNOSIS — R0600 Dyspnea, unspecified: Secondary | ICD-10-CM

## 2021-01-01 DIAGNOSIS — R0689 Other abnormalities of breathing: Secondary | ICD-10-CM | POA: Diagnosis not present

## 2021-01-01 LAB — ECHOCARDIOGRAM LIMITED: Area-P 1/2: 3.93 cm2

## 2021-01-02 ENCOUNTER — Encounter: Payer: Self-pay | Admitting: Family Medicine

## 2021-01-02 ENCOUNTER — Ambulatory Visit (INDEPENDENT_AMBULATORY_CARE_PROVIDER_SITE_OTHER): Payer: Medicare Other | Admitting: Family Medicine

## 2021-01-02 VITALS — BP 132/80 | HR 108 | Temp 98.2°F | Ht 72.0 in | Wt 266.0 lb

## 2021-01-02 DIAGNOSIS — B351 Tinea unguium: Secondary | ICD-10-CM | POA: Diagnosis not present

## 2021-01-02 DIAGNOSIS — R000 Tachycardia, unspecified: Secondary | ICD-10-CM | POA: Diagnosis not present

## 2021-01-02 MED ORDER — TERBINAFINE HCL 250 MG PO TABS: 250.0000 mg | ORAL_TABLET | Freq: Every day | ORAL | 1 refills | Status: DC

## 2021-01-02 MED ORDER — APIXABAN 5 MG PO TABS: 5.0000 mg | ORAL_TABLET | Freq: Two times a day (BID) | ORAL | 0 refills | Status: DC

## 2021-01-02 NOTE — Patient Instructions (Signed)
Stay hydrated.  Give [place] 2-3 business days to get the results of your labs back.   Let us know if you need anything.

## 2021-01-02 NOTE — Progress Notes (Signed)
Chief Complaint  Patient presents with   Follow-up   Nail Problem    Bill Fox is a 80 y.o. male here for a nail complaint.  Duration: 1 week Location: toenails Pruritic? No Painful? No Drainage? No New soaps/lotions/topicals/detergents? No Other associated symptoms: Yellowing of nails Therapies tried thus far: Rx'd Lamisil but told to come here.   Tachycardia Pt does not have any CP, SOB, or palpitations. He states he did rush in. He has been compliant with his Eliquis. No unexplained wt changes or new swelling.   Past Medical History:  Diagnosis Date   Anxiety    At risk for sleep apnea    STOP-BANG= 5     SENT TO PCP 11-29-2013   Cataract    Chronic fatigue    DJD (degenerative joint disease)    RIGHT KNEE   History of colon polyps    2011  &  2013 --  ADENOMATOUS   History of pulmonary embolism    Hyperlipidemia    pt denies ever having high chol   Hypertension    Right knee meniscal tear    Ulcerative colitis     BP 132/80   Pulse (!) 108   Temp 98.2 F (36.8 C) (Oral)   Ht 6' (1.829 m)   Wt 266 lb (120.7 kg)   SpO2 96%   BMI 36.08 kg/m  Gen: awake, alert, appearing stated age Lungs: CTAB. No accessory muscle use Skin: yellowing and thickening of the nails of the toes. No drainage, erythema, TTP, fluctuance, excoriation Heart: Tachycardic, reg rhythm, 2+ LE edema tapering at prox 1/3 of tibia b/l without calf pain Psych: Age appropriate judgment and insight  Onychomycosis - Plan: Hepatic function panel  Tachycardia - Plan: CBC, TSH  Lamisil 250 mg/d. Ck hep function panel in 1 mo. F/u in 3 mo in clinic. New problem, uncertain prog. Unlikely to be PE again given lack of s/s's and compliance with Eliquis. Ck labs. Monitor.  F/u prn. The patient voiced understanding and agreement to the plan.  [place], DO 01/02/21 2:25 PM

## 2021-01-03 ENCOUNTER — Telehealth: Payer: Self-pay | Admitting: Family Medicine

## 2021-01-03 LAB — CBC
HCT: 42.8 % (ref 39.0–52.0)
Hemoglobin: 14.1 g/dL (ref 13.0–17.0)
MCHC: 32.9 g/dL (ref 30.0–36.0)
MCV: 88.7 fl (ref 78.0–100.0)
Platelets: 209 10*3/uL (ref 150.0–400.0)
RBC: 4.82 Mil/uL (ref 4.22–5.81)
RDW: 17.7 % — ABNORMAL HIGH (ref 11.5–15.5)
WBC: 9 10*3/uL (ref 4.0–10.5)

## 2021-01-03 LAB — TSH: TSH: 4.41 u[IU]/mL (ref 0.35–5.50)

## 2021-01-19 ENCOUNTER — Ambulatory Visit (INDEPENDENT_AMBULATORY_CARE_PROVIDER_SITE_OTHER): Payer: Medicare Other | Admitting: Internal Medicine

## 2021-01-19 ENCOUNTER — Other Ambulatory Visit: Payer: Self-pay

## 2021-01-19 ENCOUNTER — Encounter: Payer: Self-pay | Admitting: Internal Medicine

## 2021-01-19 ENCOUNTER — Ambulatory Visit (HOSPITAL_BASED_OUTPATIENT_CLINIC_OR_DEPARTMENT_OTHER)
Admission: RE | Admit: 2021-01-19 | Discharge: 2021-01-19 | Disposition: A | Discharge status: Home / Self Care | Payer: Medicare Other | Source: Ambulatory Visit | Attending: Internal Medicine | Admitting: Internal Medicine

## 2021-01-19 VITALS — BP 132/80 | HR 106 | Temp 97.7°F | Resp 18 | Ht 72.0 in | Wt 269.1 lb

## 2021-01-19 DIAGNOSIS — I10 Essential (primary) hypertension: Secondary | ICD-10-CM | POA: Diagnosis not present

## 2021-01-19 DIAGNOSIS — R609 Edema, unspecified: Secondary | ICD-10-CM | POA: Insufficient documentation

## 2021-01-19 DIAGNOSIS — J90 Pleural effusion, not elsewhere classified: Secondary | ICD-10-CM | POA: Diagnosis not present

## 2021-01-19 DIAGNOSIS — R0989 Other specified symptoms and signs involving the circulatory and respiratory systems: Secondary | ICD-10-CM

## 2021-01-19 DIAGNOSIS — I509 Heart failure, unspecified: Secondary | ICD-10-CM | POA: Insufficient documentation

## 2021-01-19 MED ORDER — FUROSEMIDE 20 MG PO TABS: 20.0000 mg | ORAL_TABLET | Freq: Every day | ORAL | 0 refills | Status: DC

## 2021-01-19 NOTE — Progress Notes (Signed)
Subjective:    Patient ID: Bill Fox, male    DOB: 1941/02/18, 79 y.o.   MRN: [id number]  DOS:  01/19/2021 Type of visit - description: Acute  The patient's main concern today is lower extremity edema. He said that this is going on for few months, he thinks is related to prednisone. Has good days and bad days, the worst he has seen was 2 days ago.  He was diagnosed with PE 11/20/2020, reports that currently his breathing has improved significantly since then. Denies chest pain. Does have mild DOE No fever chills   Wt Readings from Last 3 Encounters:  01/19/21 269 lb 2 oz (122.1 kg)  01/02/21 266 lb (120.7 kg)  12/26/20 257 lb (116.6 kg)     Review of Systems See above   Past Medical History:  Diagnosis Date   Anxiety    At risk for sleep apnea    STOP-BANG= 5     SENT TO PCP 11-29-2013   Cataract    Chronic fatigue    DJD (degenerative joint disease)    RIGHT KNEE   History of colon polyps    2011  &  2013 --  ADENOMATOUS   History of pulmonary embolism    Hyperlipidemia    pt denies ever having high chol   Hypertension    Right knee meniscal tear    Ulcerative colitis     Past Surgical History:  Procedure Laterality Date   CATARACT EXTRACTION W/ INTRAOCULAR LENS IMPLANT Left 2014   COLONOSCOPY W/ POLYPECTOMY  MULTIPLE--  LAST ONE 09-12-2011   KNEE ARTHROSCOPY WITH DRILLING/MICROFRACTURE Right 12/03/2013   Procedure: RIGHT KNEE ARTHROSCOPY PARTIAL MEDIAL MENISCECTOMY DEBRIDEMENT SYNOVECTOMY;  Surgeon: Johnn Hai, MD;  Location: [facility];  Service: Orthopedics;  Laterality: Right;   NASAL FRACTURE SURGERY  1985   TRANSTHORACIC ECHOCARDIOGRAM  11-01-2008   MILD LVH/  EF 76%/  GRADE I DIASTOLIC DYSFUNCTION/  MILD LAE    Allergies as of 01/19/2021   No Known Allergies      Medication List        Accurate as of January 19, 2021 10:42 AM. If you have any questions, ask your nurse or doctor.          amLODipine 2.5 MG  tablet Commonly known as: NORVASC Take 1 tablet (2.5 mg total) by mouth daily.   apixaban 5 MG Tabs tablet Commonly known as: ELIQUIS Take 1 tablet (5 mg total) by mouth 2 (two) times daily.   doxazosin 8 MG tablet Commonly known as: CARDURA Take 0.5 tablets (4 mg total) by mouth at bedtime.   mesalamine 0.375 g 24 hr capsule Commonly known as: Apriso Take 4 capsules (1.5 g total) by mouth daily.   mirtazapine 30 MG tablet Commonly known as: REMERON Take 1 tablet (30 mg total) by mouth at bedtime.   predniSONE 10 MG tablet Commonly known as: DELTASONE Take 4 tablets (40 mg total) by mouth daily with breakfast.   terbinafine 250 MG tablet Commonly known as: LamISIL Take 1 tablet (250 mg total) by mouth daily. Start taking on: February 01, 2021           Objective:   Physical Exam BP 132/80 (BP Location: Left Arm, Patient Position: Sitting, Cuff Size: Normal)   Pulse (!) 106   Temp 97.7 F (36.5 C) (Oral)   Resp 18   Ht 6' (1.829 m)   Wt 269 lb 2 oz (122.1 kg)   SpO2  95%   BMI 36.50 kg/m  General:   Well developed, NAD, BMI noted.  HEENT:  Normocephalic . Face symmetric, atraumatic Neck: No JVD at 45 degrees although exam is limited by patient habitus. Lungs:  Slightly decreased breath sounds at bases? Normal respiratory effort, no intercostal retractions, no accessory muscle use. Heart: RRR,  no murmur.  Abdomen:  Not distended, soft, non-tender. No rebound or rigidity.   Skin: Not pale. Not jaundice Lower extremities: ++/+++  pretibial edema bilaterally  Neurologic:  alert & oriented X3.  Speech normal, gait appropriate for age and unassisted Psych--  Cognition and judgment appear intact.  Cooperative with normal attention span and concentration.  Behavior appropriate. No anxious or depressed appearing.     Assessment     80 year old gentleman, PMH includes CAD, saddle pulmonary embolism 11/2020, DVT, anticoagulated, HTN, presents with:  Lower  extremity edema, weight gain: Symptoms as described above in the context of recent PE (without chest pain, breathing improving according to patient), starting amlodipine 11/26/2020 and a  higher doses of prednisone in the last few weeks. He is a slightly tachycardic, EKG today: Sinus tachycardia, rate 100, no acute changes, no A. fib. Plan: To better assess his volume status needs, check a chest x-ray, BNP Check BMP Stop amlodipine, start Lasix 20 mg, office visit next week. Addendum: left w/o going to lab HTN: see above  This visit occurred during the SARS-CoV-2 public health emergency.  Safety protocols were in place, including screening questions prior to the visit, additional usage of staff PPE, and extensive cleaning of exam room while observing appropriate contact time as indicated for disinfecting solutions.

## 2021-01-19 NOTE — Patient Instructions (Addendum)
Stop amlodipine  Start Lasix 20 mg 1 tablet in the mornings.  Check the  blood pressure daily  BP GOAL is between 110/65 and  135/85. If it is consistently higher or lower, let me know  GO TO THE LAB : Get the blood work     [place], [place] back for a checkup in 1 week with Dr. Nani Ravens  STOP BY THE FIRST FLOOR:  get the XR

## 2021-01-23 ENCOUNTER — Telehealth: Payer: Self-pay | Admitting: Internal Medicine

## 2021-01-23 DIAGNOSIS — K51919 Ulcerative colitis, unspecified with unspecified complications: Secondary | ICD-10-CM

## 2021-01-23 DIAGNOSIS — E785 Hyperlipidemia, unspecified: Secondary | ICD-10-CM

## 2021-01-23 DIAGNOSIS — R197 Diarrhea, unspecified: Secondary | ICD-10-CM

## 2021-01-23 MED ORDER — PREDNISONE 10 MG PO TABS: 40.0000 mg | ORAL_TABLET | Freq: Every day | ORAL | 3 refills | Status: DC

## 2021-01-23 NOTE — Telephone Encounter (Signed)
Prednisone refilled

## 2021-01-23 NOTE — Telephone Encounter (Signed)
Patient requesting refill on Prednisone until OV on 02/08/21.

## 2021-01-23 NOTE — Telephone Encounter (Deleted)
Patient called for further refill on Prednisone until OV 02/08/21.

## 2021-01-26 ENCOUNTER — Encounter: Payer: Self-pay | Admitting: Family Medicine

## 2021-01-26 ENCOUNTER — Ambulatory Visit (INDEPENDENT_AMBULATORY_CARE_PROVIDER_SITE_OTHER): Payer: Medicare Other | Admitting: Family Medicine

## 2021-01-26 ENCOUNTER — Other Ambulatory Visit: Payer: Self-pay

## 2021-01-26 ENCOUNTER — Other Ambulatory Visit: Payer: Self-pay | Admitting: Family Medicine

## 2021-01-26 VITALS — BP 140/78 | HR 88 | Temp 98.6°F | Ht 72.0 in | Wt 270.2 lb

## 2021-01-26 DIAGNOSIS — B351 Tinea unguium: Secondary | ICD-10-CM

## 2021-01-26 DIAGNOSIS — R600 Localized edema: Secondary | ICD-10-CM | POA: Diagnosis not present

## 2021-01-26 LAB — COMPREHENSIVE METABOLIC PANEL
ALT: 18 U/L (ref 0–53)
AST: 11 U/L (ref 0–37)
Albumin: 3.6 g/dL (ref 3.5–5.2)
Alkaline Phosphatase: 55 U/L (ref 39–117)
BUN: 24 mg/dL — ABNORMAL HIGH (ref 6–23)
CO2: 25 mEq/L (ref 19–32)
Calcium: 8.8 mg/dL (ref 8.4–10.5)
Chloride: 107 mEq/L (ref 96–112)
Creatinine, Ser: 0.96 mg/dL (ref 0.40–1.50)
GFR: 76.06 mL/min (ref >60.00)
Glucose, Bld: 97 mg/dL (ref 70–99)
Potassium: 4 mEq/L (ref 3.5–5.1)
Sodium: 141 mEq/L (ref 135–145)
Total Bilirubin: 0.3 mg/dL (ref 0.2–1.2)
Total Protein: 6.5 g/dL (ref 6.0–8.3)

## 2021-01-26 MED ORDER — FUROSEMIDE 20 MG PO TABS: 40.0000 mg | ORAL_TABLET | Freq: Every day | ORAL | 0 refills | Status: DC

## 2021-01-26 NOTE — Progress Notes (Signed)
Chief Complaint  Patient presents with   Follow-up    Legs     [name] here for bilateral leg swelling.  Duration: 2 months Hx of prolonged bedrest, recent surgery, travel or injury? No Pain the calf? No SOB? No Personal or family history of clot or bleeding disorder? No This started after he was placed on prednisone for UC by the GI team.  Hx of heart failure, renal failure, hepatic failure? No Started Lasix 20 mg/d 1 week ago.   Past Medical History:  Diagnosis Date   Anxiety    At risk for sleep apnea    STOP-BANG= 5     SENT TO PCP 11-29-2013   Cataract    Chronic fatigue    DJD (degenerative joint disease)    RIGHT KNEE   History of colon polyps    2011  &  2013 --  ADENOMATOUS   History of pulmonary embolism    Hyperlipidemia    pt denies ever having high chol   Hypertension    Right knee meniscal tear    Ulcerative colitis    Family History  Problem Relation Age of Onset   Diabetes Mother    Stomach cancer Father    Lung cancer Brother    Diabetes Paternal Uncle        x 2   Colon cancer Neg Hx    Pancreatic cancer Neg Hx    Esophageal cancer Neg Hx    Colon polyps Neg Hx    Past Surgical History:  Procedure Laterality Date   CATARACT EXTRACTION W/ INTRAOCULAR LENS IMPLANT Left 2014   COLONOSCOPY W/ POLYPECTOMY  MULTIPLE--  LAST ONE 09-12-2011   KNEE ARTHROSCOPY WITH DRILLING/MICROFRACTURE Right 12/03/2013   Procedure: RIGHT KNEE ARTHROSCOPY PARTIAL MEDIAL MENISCECTOMY DEBRIDEMENT SYNOVECTOMY;  Surgeon: Johnn Hai, MD;  Location: [facility];  Service: Orthopedics;  Laterality: Right;   NASAL FRACTURE SURGERY  1985   TRANSTHORACIC ECHOCARDIOGRAM  11-01-2008   MILD LVH/  EF 43%/  GRADE I DIASTOLIC DYSFUNCTION/  MILD LAE    Current Outpatient Medications:    apixaban (ELIQUIS) 5 MG TABS tablet, Take 1 tablet (5 mg total) by mouth 2 (two) times daily., Disp: 180 tablet, Rfl: 0   doxazosin (CARDURA) 8 MG tablet, Take 0.5  tablets (4 mg total) by mouth at bedtime., Disp: , Rfl:    furosemide (LASIX) 20 MG tablet, Take 1 tablet (20 mg total) by mouth daily., Disp: 30 tablet, Rfl: 0   mesalamine (APRISO) 0.375 g 24 hr capsule, Take 4 capsules (1.5 g total) by mouth daily., Disp: 120 capsule, Rfl: 3   mirtazapine (REMERON) 30 MG tablet, Take 1 tablet (30 mg total) by mouth at bedtime., Disp: 30 tablet, Rfl: 0   predniSONE (DELTASONE) 10 MG tablet, Take 4 tablets (40 mg total) by mouth daily with breakfast., Disp: 120 tablet, Rfl: 3   [START ON 02/01/2021] terbinafine (LAMISIL) 250 MG tablet, Take 1 tablet (250 mg total) by mouth daily., Disp: 30 tablet, Rfl: 1  BP 140/78   Pulse 88   Temp 98.6 F (37 C) (Oral)   Ht 6' (1.829 m)   Wt 270 lb 4 oz (122.6 kg)   SpO2 96%   BMI 36.65 kg/m  Gen- awake, alert, appears stated age Heart- RRR, no murmurs, 2+ pitting LE edema tapering at the knees Lungs- CTAB, normal effort w/o accessory muscle use MSK- No b/l calf pain Skin- thickened and yellowed nails of feet b/l  Psych: Age appropriate judgment and insight  Bilateral lower extremity edema - Plan: Comprehensive metabolic panel  Onychomycosis - Plan: Ambulatory referral to Podiatry  Chronic, unstable. Will ck labs, if normal will increase dosage of Lasix from 20 mg/d to 40 mg/d. Will see on labs if he needs K.  Refer to podiatry.  F/u in 10 d. Pt voiced understanding and agreement to the plan.  [place], DO 01/26/21  10:37 AM

## 2021-01-26 NOTE — Patient Instructions (Signed)
Give [place] 2-3 business days to get the results of your labs back.   For the swelling in your lower extremities, be sure to elevate your legs when able, mind the salt intake, stay physically active and consider wearing compression stockings.   Let us know if you need anything.

## 2021-02-05 ENCOUNTER — Other Ambulatory Visit: Payer: Self-pay

## 2021-02-05 ENCOUNTER — Ambulatory Visit (INDEPENDENT_AMBULATORY_CARE_PROVIDER_SITE_OTHER): Payer: Medicare Other | Admitting: Podiatry

## 2021-02-05 DIAGNOSIS — M79676 Pain in unspecified toe(s): Secondary | ICD-10-CM

## 2021-02-05 DIAGNOSIS — L603 Nail dystrophy: Secondary | ICD-10-CM

## 2021-02-05 NOTE — Progress Notes (Signed)
  Subjective:  Patient ID: Bill Fox, male    DOB: July 31, 1940,  MRN: [id number]  Chief Complaint  Patient presents with   Nail Problem    BL 1-10 nials thickening and discoloration x 1 mo - no injury/soreness at nails area - PCP Rx'd lamisil 1 mo ago and has not noticed any improvment   80 y.o. male presents with the above complaint. History confirmed with patient.   Objective:  Physical Exam: warm, good capillary refill, no trophic changes or ulcerative lesions, normal DP and PT pulses, and normal sensory exam.  Nails x10 dystrophic several with transverse ridging, partial lysis distally.  No warmth no erythema no signs of acute infection noted. Assessment:   1. Nail dystrophy   2. Pain around toenail      Plan:  Patient was evaluated and treated and all questions answered.  Onychodystrophy -Debrided nails courtesy today.  I think his changes are more likely due to trauma or other systemic issue rather than true onychomycosis however as he has started Lamisil is reasonable to see how he responds.  We will continue for the full 3 months of therapy and follow-up thereafter to discuss other options  No follow-ups on file.

## 2021-02-06 ENCOUNTER — Encounter: Payer: Self-pay | Admitting: Family Medicine

## 2021-02-06 ENCOUNTER — Ambulatory Visit (INDEPENDENT_AMBULATORY_CARE_PROVIDER_SITE_OTHER): Payer: Medicare Other | Admitting: Family Medicine

## 2021-02-06 VITALS — BP 122/76 | HR 116 | Temp 99.2°F | Ht 72.0 in | Wt 274.0 lb

## 2021-02-06 DIAGNOSIS — R600 Localized edema: Secondary | ICD-10-CM | POA: Diagnosis not present

## 2021-02-06 MED ORDER — FUROSEMIDE 40 MG PO TABS: 40.0000 mg | ORAL_TABLET | Freq: Two times a day (BID) | ORAL | 3 refills | Status: DC

## 2021-02-06 NOTE — Patient Instructions (Addendum)
Give [place] 2-3 business days to get the results of your labs back.   Your legs look quite a bit better today. Strong work.   Elevate your legs and stay as active as possible.   Wear the compression stockings as able.   Keep me in the loop regarding what decision Dr. Henrene Pastor makes with the prednisone.   Let us know if you need anything.

## 2021-02-06 NOTE — Progress Notes (Signed)
Chief Complaint  Patient presents with   Follow-up    Subjective: Patient is a 80 y.o. male here for f/u swelling.  Pt having swelling likely 2/2 prednisone used for UC. Has appt in 2 d w GI to discuss what to do with it moving forward. He had Lasix 20 mg/d increased to 40 mg/d and swelling improved. Not taking K as this had been stable, here to reck this today.    Past Medical History:  Diagnosis Date   Anxiety    At risk for sleep apnea    STOP-BANG= 5     SENT TO PCP 11-29-2013   Cataract    Chronic fatigue    DJD (degenerative joint disease)    RIGHT KNEE   History of colon polyps    2011  &  2013 --  ADENOMATOUS   History of pulmonary embolism    Hyperlipidemia    pt denies ever having high chol   Hypertension    Right knee meniscal tear    Ulcerative colitis     Objective: BP 122/76   Pulse (!) 116   Temp 99.2 F (37.3 C) (Oral)   Ht 6' (1.829 m)   Wt 274 lb (124.3 kg)   SpO2 96%   BMI 37.16 kg/m  General: Awake, appears stated age HEENT: MMM, EOMi Heart: RRR, 2+ LE edema tapering at knee on L, 2+ pitting edema on R tapering at prox 1/3 of tibia MSK: No calf pain Lungs: No accessory muscle use Psych: Age appropriate judgment and insight, normal affect and mood  Assessment and Plan: Bilateral lower extremity edema - Plan: Basic metabolic panel  Cont Lasix 40 mg/d. Ck BMP today. Will await disposition for pred w GI in 2 d.  F/u as originally scheduled in meanwhile.  The patient voiced understanding and agreement to the plan.  [place], DO 02/06/21  2:01 PM

## 2021-02-07 LAB — BASIC METABOLIC PANEL
BUN: 22 mg/dL (ref 6–23)
CO2: 26 mEq/L (ref 19–32)
Calcium: 9.2 mg/dL (ref 8.4–10.5)
Chloride: 100 mEq/L (ref 96–112)
Creatinine, Ser: 1.18 mg/dL (ref 0.40–1.50)
GFR: 59.36 mL/min — ABNORMAL LOW (ref >60.00)
Glucose, Bld: 187 mg/dL — ABNORMAL HIGH (ref 70–99)
Potassium: 4.4 mEq/L (ref 3.5–5.1)
Sodium: 138 mEq/L (ref 135–145)

## 2021-02-08 ENCOUNTER — Encounter: Payer: Self-pay | Admitting: Internal Medicine

## 2021-02-08 ENCOUNTER — Ambulatory Visit (INDEPENDENT_AMBULATORY_CARE_PROVIDER_SITE_OTHER): Payer: Medicare Other | Admitting: Internal Medicine

## 2021-02-08 VITALS — BP 152/78 | HR 84 | Ht 72.0 in | Wt 273.0 lb

## 2021-02-08 DIAGNOSIS — K51919 Ulcerative colitis, unspecified with unspecified complications: Secondary | ICD-10-CM

## 2021-02-08 DIAGNOSIS — R197 Diarrhea, unspecified: Secondary | ICD-10-CM

## 2021-02-08 DIAGNOSIS — E785 Hyperlipidemia, unspecified: Secondary | ICD-10-CM | POA: Diagnosis not present

## 2021-02-08 MED ORDER — PREDNISONE 10 MG PO TABS: ORAL_TABLET | ORAL | 0 refills | Status: DC

## 2021-02-08 NOTE — Progress Notes (Signed)
HISTORY OF PRESENT ILLNESS:  Bill Fox is a 80 y.o. male with left-sided ulcerative colitis who has been evaluated recently for persistent flare of disease.  Last seen December 26, 2020.  He also has a history of recent DVT with pulmonary embolism for which she is on chronic anticoagulation.  At the time of his last visit he was continued on prednisone 40 mg daily as well as Apriso 1.5 g daily.  He presents today for follow-up.  He tells me that he is significantly better since his last visit.  He would say 70% toward his baseline.  He did have a regular formed bowel movement today.  He has not been experiencing issues with pain or bleeding.  He will get occasional urgency.  The side effect of his prednisone has been weight gain and lower extremity edema.  Otherwise, no new issues.  Review of blood work from January 26, 2021 shows normal liver tests.  Last CBC in August revealed hemoglobin 14.1.  C-reactive protein at that time 4.56  REVIEW OF SYSTEMS:  All non-GI ROS negative.  Past Medical History:  Diagnosis Date   Anxiety    At risk for sleep apnea    STOP-BANG= 5     SENT TO PCP 11-29-2013   Cataract    Chronic fatigue    DJD (degenerative joint disease)    RIGHT KNEE   History of colon polyps    2011  &  2013 --  ADENOMATOUS   History of pulmonary embolism    Hyperlipidemia    pt denies ever having high chol   Hypertension    Right knee meniscal tear    Ulcerative colitis     Past Surgical History:  Procedure Laterality Date   CATARACT EXTRACTION W/ INTRAOCULAR LENS IMPLANT Left 2014   COLONOSCOPY W/ POLYPECTOMY  MULTIPLE--  LAST ONE 09-12-2011   KNEE ARTHROSCOPY WITH DRILLING/MICROFRACTURE Right 12/03/2013   Procedure: RIGHT KNEE ARTHROSCOPY PARTIAL MEDIAL MENISCECTOMY DEBRIDEMENT SYNOVECTOMY;  Surgeon: Johnn Hai, MD;  Location: [facility];  Service: Orthopedics;  Laterality: Right;   NASAL FRACTURE SURGERY  1985   TRANSTHORACIC ECHOCARDIOGRAM   11-01-2008   MILD LVH/  EF 38%/  GRADE I DIASTOLIC DYSFUNCTION/  MILD LAE    Social History Bill Fox  reports that he quit smoking about 27 years ago. His smoking use included cigarettes. He has a 30.00 pack-year smoking history. He has never used smokeless tobacco. He reports current alcohol use of about 1.0 standard drink per week. He reports that he does not use drugs.  family history includes Diabetes in his mother and paternal uncle; Lung cancer in his brother; Stomach cancer in his father.  No Known Allergies     PHYSICAL EXAMINATION: Vital signs: BP (!) 152/78   Pulse 84   Ht 6' (1.829 m)   Wt 273 lb (123.8 kg)   BMI 37.03 kg/m   Constitutional: generally well-appearing, no acute distress Psychiatric: alert and oriented x3, cooperative Eyes: extraocular movements intact, anicteric, conjunctiva pink Mouth: oral pharynx moist, no lesions Neck: supple no lymphadenopathy Cardiovascular: heart regular rate and rhythm, no murmur Lungs: clear to auscultation bilaterally Abdomen: soft, obese nontender, nondistended, no obvious ascites, no peritoneal signs, normal bowel sounds, no organomegaly Rectal: Omitted Extremities: no clubbing or cyanosis.  2+ lower extremity edema bilaterally.   Skin: no lesions on visible extremities except on mycoses of the toes Neuro: No focal deficits.   ASSESSMENT:  1.  Left-sided ulcerative colitis.  Recent flare improving on prednisone. 2.  Recent DVT with pulmonary embolism on anticoagulation therapy 3.  Multiple medical problems   PLAN:  1.  Prednisone taper.  Decrease prednisone to 30 mg daily for 2 weeks, then 20 mg daily for 2 weeks, then 10 mg daily for 2 weeks, then stop. 2.  Continue Apriso 1.5 g daily 3.  Office follow-up 2 months.  Contact the office in the interim for questions or problems

## 2021-02-08 NOTE — Patient Instructions (Signed)
If you are age 78 or older, your body mass index should be between 23-30. Your Body mass index is 37.03 kg/m. If this is out of the aforementioned range listed, please consider follow up with your Primary Care Provider.  If you are age 75 or younger, your body mass index should be between 19-25. Your Body mass index is 37.03 kg/m. If this is out of the aformentioned range listed, please consider follow up with your Primary Care Provider.   __________________________________________________________  The [facility] providers would like to encourage you to use [facility] to communicate with providers for non-urgent requests or questions.  Due to long hold times on the telephone, sending your provider a message by [facility] may be a faster and more efficient way to get a response.  Please allow 48 business hours for a response.  Please remember that this is for non-urgent requests.   We have sent the following medications to your pharmacy for you to pick up at your convenience:  Prednisone - decrease to 21m for 2 weeks, then 251mfor 2 weeks then 1066mor 2 weeks then stop.  Please follow up on 04/05/2021 at 11:20pm

## 2021-02-12 ENCOUNTER — Telehealth: Payer: Self-pay | Admitting: Family Medicine

## 2021-02-12 ENCOUNTER — Telehealth: Payer: Self-pay | Admitting: Internal Medicine

## 2021-02-12 DIAGNOSIS — E785 Hyperlipidemia, unspecified: Secondary | ICD-10-CM

## 2021-02-12 DIAGNOSIS — R197 Diarrhea, unspecified: Secondary | ICD-10-CM

## 2021-02-12 DIAGNOSIS — K51919 Ulcerative colitis, unspecified with unspecified complications: Secondary | ICD-10-CM

## 2021-02-12 MED ORDER — PREDNISONE 10 MG PO TABS: ORAL_TABLET | ORAL | 0 refills | Status: AC

## 2021-02-12 NOTE — Telephone Encounter (Signed)
Inbound call from pt requesting a call back stating that he does not have enough prednisone to carry him out as directed for him to take for 2 weeks. Please advise. Thank you.

## 2021-02-12 NOTE — Telephone Encounter (Signed)
Resent Prednisone rx

## 2021-02-12 NOTE — Telephone Encounter (Signed)
Pt. Called in and wanted to inform you of what his GI is having him do regarding prednisone: Dr.Perry is starting to wean him off medication.  He also wanted you to know that the swelling in his feet has gone down.

## 2021-02-13 ENCOUNTER — Encounter: Payer: Self-pay | Admitting: Pulmonary Disease

## 2021-02-13 ENCOUNTER — Ambulatory Visit (INDEPENDENT_AMBULATORY_CARE_PROVIDER_SITE_OTHER): Payer: Medicare Other | Admitting: Pulmonary Disease

## 2021-02-13 ENCOUNTER — Other Ambulatory Visit: Payer: Self-pay

## 2021-02-13 VITALS — BP 140/82 | HR 76 | Temp 98.2°F | Ht 72.0 in | Wt 270.8 lb

## 2021-02-13 DIAGNOSIS — R0600 Dyspnea, unspecified: Secondary | ICD-10-CM | POA: Diagnosis not present

## 2021-02-13 DIAGNOSIS — R0609 Other forms of dyspnea: Secondary | ICD-10-CM

## 2021-02-13 NOTE — Patient Instructions (Signed)
Nice to see you again  I recommend to continue the blood thinner for the foreseeable future, potentially lifelong as long as you have no bleeding complications.  We will get pulmonary function tests at your next visit.  This will help to further evaluate why he may be short of breath.  I recommend graduated exercise at Home, 5 minutes a day Monday through Friday for 2 weeks then increase by 2 to 3 minutes every day until you build up your stamina.  Return to clinic in 6 months with PFTs same day follow-up with Dr. Silas Flood

## 2021-02-13 NOTE — Progress Notes (Signed)
@Patient  ID: Bill Fox, male    DOB: 11/18/1940, 80 y.o.   MRN: [id number]  Chief Complaint  Patient presents with   Consult    Prednisone    Referring provider: Shelda Fox*  HPI:   80 y.o. man whom we are seeing in hospital follow-up for evaluation of unprovoked PE.  Notably, this is a second pulmonary embolus in his life, first in the setting of pneumonia and relative immobility.  Overall, he is doing okay.  No issues with Eliquis.  No bleeding.  No hematochezia or black stools.  Notably has UC.  He continues on pretty heavy dose of steroids for uncontrolled UC.  Gnosis is caused weight gain, lower extremity swelling.  Notably, his weight is up about 13 pounds since seen in the hospital in July.  He does have baseline dyspnea on exertion.  Again, improved from when he is in the hospital with acute PE.  But overall some shortness of breath.  He notes he is not as active as he once was.  He endorses weight gain.  We discussed role of cigarette smoking and possibly contributing.    Questionaires / Pulmonary Flowsheets:   ACT:  No flowsheet data found.  MMRC: No flowsheet data found.  Epworth:  No flowsheet data found.  Tests:   FENO:  No results found for: NITRICOXIDE  PFT: No flowsheet data found.  WALK:  No flowsheet data found.  Imaging: Personally reviewed and as per EMR discussion of this note DG Chest 2 View  Result Date: 01/19/2021 CLINICAL DATA:  Edema.  Weight gain.  History of blood clots. EXAM: CHEST - 2 VIEW COMPARISON:  11/20/2020 FINDINGS: Heart size is normal. Decreased lung volumes. Blunting of the posterior costophrenic angles identified which may reflect trace pleural effusions. No interstitial edema. No airspace opacities. Remote left posterior rib fractures. IMPRESSION: Low lung volumes. Blunting of the posterior costophrenic angles may reflect trace pleural effusions. No edema. Electronically Signed   By: Kerby Moors M.D.   On:  01/19/2021 12:03    Lab Results: Personally reviewed CBC    Component Value Date/Time   WBC 9.0 01/02/2021 1419   RBC 4.82 01/02/2021 1419   HGB 14.1 01/02/2021 1419   HCT 42.8 01/02/2021 1419   PLT 209.0 01/02/2021 1419   MCV 88.7 01/02/2021 1419   MCH 28.2 11/25/2020 0426   MCHC 32.9 01/02/2021 1419   RDW 17.7 (H) 01/02/2021 1419   LYMPHSABS 2.4 12/20/2020 1325   MONOABS 0.8 12/20/2020 1325   EOSABS 0.1 12/20/2020 1325   BASOSABS 0.0 12/20/2020 1325    BMET    Component Value Date/Time   NA 138 02/06/2021 1346   K 4.4 02/06/2021 1346   CL 100 02/06/2021 1346   CO2 26 02/06/2021 1346   GLUCOSE 187 (H) 02/06/2021 1346   BUN 22 02/06/2021 1346   CREATININE 1.18 02/06/2021 1346   CALCIUM 9.2 02/06/2021 1346   GFRNONAA >60 11/25/2020 0426   GFRAA >60 07/12/2015 0614    BNP    Component Value Date/Time   BNP 42.9 11/20/2020 1608    ProBNP    Component Value Date/Time   PROBNP <30.0 07/06/2008 0941    Specialty Problems       Pulmonary Problems   PULMONARY EMBOLISM AND INFARCTION    Qualifier: Diagnosis of  By: Melvyn Novas MD, Legrand Como B       Cough            Solitary pulmonary nodule  First seen L [facility] 01/30/2015 > CT chest 02/06/15 > no nodule       No Known Allergies  Immunization History  Administered Date(s) Administered   Influenza Split 02/18/2016   Influenza Whole 02/16/2009, 01/31/2010   Influenza, High Dose Seasonal PF 03/11/2018   Influenza,inj,Quad PF,6+ Mos 03/03/2014, 01/30/2015   Janssen (J&J) SARS-COV-2 Vaccination 07/29/2019   Moderna Sars-Covid-2 Vaccination 05/22/2020   Pneumococcal Conjugate-13 01/30/2015   Pneumococcal Polysaccharide-23 06/20/2008   Tdap 09/03/2011    Past Medical History:  Diagnosis Date   Anxiety    At risk for sleep apnea    STOP-BANG= 5     SENT TO PCP 11-29-2013   Cataract    Chronic fatigue    DJD (degenerative joint disease)    RIGHT KNEE   History of colon polyps    2011  &  2013 --   ADENOMATOUS   History of pulmonary embolism    Hyperlipidemia    pt denies ever having high chol   Hypertension    Right knee meniscal tear    Ulcerative colitis     Tobacco History: Social History   Tobacco Use  Smoking Status Former   Packs/day: 1.00   Years: 30.00   Pack years: 30.00   Types: Cigarettes   Quit date: 05/20/1993   Years since quitting: 27.7  Smokeless Tobacco Never   Counseling given: Not Answered   Continue to not smoke  Outpatient Encounter Medications as of 02/13/2021  Medication Sig   apixaban (ELIQUIS) 5 MG TABS tablet Take 1 tablet (5 mg total) by mouth 2 (two) times daily.   doxazosin (CARDURA) 8 MG tablet Take 0.5 tablets (4 mg total) by mouth at bedtime.   furosemide (LASIX) 40 MG tablet Take 1 tablet (40 mg total) by mouth 2 (two) times daily.   mesalamine (APRISO) 0.375 g 24 hr capsule Take 4 capsules (1.5 g total) by mouth daily.   mirtazapine (REMERON) 30 MG tablet Take 1 tablet (30 mg total) by mouth at bedtime.   predniSONE (DELTASONE) 10 MG tablet Take 3 tablets (30 mg total) by mouth daily with breakfast for 14 days, THEN 2 tablets (20 mg total) daily with breakfast for 14 days, THEN 1 tablet (10 mg total) daily with breakfast for 14 days.   terbinafine (LAMISIL) 250 MG tablet Take 1 tablet (250 mg total) by mouth daily.   No facility-administered encounter medications on file as of 02/13/2021.     Review of Systems  Review of Systems  N/a Physical Exam  BP 140/82 (BP Location: Left Arm, Patient Position: Sitting, Cuff Size: Large)   Pulse 76   Temp 98.2 F (36.8 C) (Oral)   Ht 6' (1.829 m)   Wt 270 lb 12.8 oz (122.8 kg)   SpO2 96%   BMI 36.73 kg/m   Wt Readings from Last 5 Encounters:  02/13/21 270 lb 12.8 oz (122.8 kg)  02/08/21 273 lb (123.8 kg)  02/06/21 274 lb (124.3 kg)  01/26/21 270 lb 4 oz (122.6 kg)  01/19/21 269 lb 2 oz (122.1 kg)    BMI Readings from Last 5 Encounters:  02/13/21 36.73 kg/m  02/08/21 37.03  kg/m  02/06/21 37.16 kg/m  01/26/21 36.65 kg/m  01/19/21 36.50 kg/m     Physical Exam  General: Well-appearing, no distress Eyes: EOMI, no icterus Neck: Supple, no JVP Pulmonary: No work of breathing, clear Cardiovascular regular rate and rhythm, no murmur Extremities: Warm, no edema MSK: No synovitis, no joint effusion  Assessment & Plan:   Submassive PE: Seems unprovoked, in setting of UC flare so possible hyperinflammatory state as predisposing factor.  Obesity also a risk factor.  2nd PE, had one many years ago with Pna and relative immobility in hospital. Recommend lifelong AC, discussed with patient. May difficult given UC and intermittent hematochezia. We will have to play by ear and be flexible.  Repeat TTE with improvement and RV size and function.  Symptoms overall improved.  Dyspnea on exertion: Likely multifactorial.  High suspicion for deconditioning.  Obesity also contributing.  Increasing weight gain on prednisone for UC.  PFTs for further evaluation at next visit.  Return in about 6 months (around 08/13/2021).   Lanier Clam, MD 02/13/2021   This appointment required 35 minutes of patient care (this includes precharting, chart review, review of results, face-to-face care, etc.).

## 2021-03-07 ENCOUNTER — Other Ambulatory Visit: Payer: Self-pay | Admitting: Family Medicine

## 2021-03-29 NOTE — Telephone Encounter (Signed)
error 

## 2021-04-02 ENCOUNTER — Ambulatory Visit (INDEPENDENT_AMBULATORY_CARE_PROVIDER_SITE_OTHER): Payer: Medicare Other | Admitting: Family Medicine

## 2021-04-02 ENCOUNTER — Other Ambulatory Visit: Payer: Self-pay

## 2021-04-02 ENCOUNTER — Encounter: Payer: Self-pay | Admitting: Family Medicine

## 2021-04-02 VITALS — BP 138/80 | HR 84 | Temp 99.2°F | Ht 72.0 in | Wt 280.1 lb

## 2021-04-02 DIAGNOSIS — Z1159 Encounter for screening for other viral diseases: Secondary | ICD-10-CM

## 2021-04-02 DIAGNOSIS — B351 Tinea unguium: Secondary | ICD-10-CM

## 2021-04-02 DIAGNOSIS — I10 Essential (primary) hypertension: Secondary | ICD-10-CM

## 2021-04-02 MED ORDER — TERBINAFINE HCL 250 MG PO TABS: 250.0000 mg | ORAL_TABLET | Freq: Every day | ORAL | 2 refills | Status: DC

## 2021-04-02 NOTE — Patient Instructions (Addendum)
Give [place] 2-3 business days to get the results of your labs back.   Keep the diet clean and stay active.  Aim to do some physical exertion for 150 minutes per week. This is typically divided into 5 days per week, 30 minutes per day. The activity should be enough to get your heart rate up. Anything is better than nothing if you have time constraints.  The new Shingrix vaccine (for shingles) is a 2 shot series. It can make people feel low energy, achy and almost like they have the flu for 48 hours after injection. Please plan accordingly when deciding on when to get this shot. Call your pharmacy to get this. The second shot of the series is less severe regarding the side effects, but it still lasts 48 hours.   When you take the Lasix (water pill), take it at least 6 hours prior to planned bedtime. OK to take it as needed for swelling.   Let us know if you need anything.

## 2021-04-02 NOTE — Progress Notes (Signed)
Chief Complaint  Patient presents with   Follow-up    Subjective Bill Fox is a 80 y.o. male who presents for hypertension follow up. He does monitor home blood pressures. Blood pressures ranging from 120-130's/70-80's on average. He is compliant with medication- Cardura 4 mg/d. Patient has these side effects of medication: none He is sometimes adhering to a healthy diet overall. Current exercise: some walking No Cp or SOB.  Toenail fungus Taking Lamixil 250 mg/d. Compliant, no AE's. New nail finally coming in, not much change at this point. Has been taking for nearly 2 mo.    Past Medical History:  Diagnosis Date   Anxiety    At risk for sleep apnea    STOP-BANG= 5     SENT TO PCP 11-29-2013   Cataract    Chronic fatigue    DJD (degenerative joint disease)    RIGHT KNEE   History of colon polyps    2011  &  2013 --  ADENOMATOUS   History of pulmonary embolism    Hyperlipidemia    pt denies ever having high chol   Hypertension    Right knee meniscal tear    Ulcerative colitis     Exam BP 138/80   Pulse 84   Temp 99.2 F (37.3 C) (Oral)   Ht 6' (1.829 m)   Wt 280 lb 2 oz (127.1 kg)   SpO2 95%   BMI 37.99 kg/m  General:  well developed, well nourished, in no apparent distress Heart: RRR, no bruits, 2+ pitting b/l LE edema tapering at distal 1/3 of tibia Lungs: clear to auscultation, no accessory muscle use Skin: New nail coming in appears to be free of hyperkeratinization. No erythema or ttp.  Psych: well oriented with normal range of affect and appropriate judgment/insight  Essential hypertension  Onychomycosis - Plan: Comprehensive metabolic panel  Encounter for hepatitis C screening test for low risk patient - Plan: Hepatitis C antibody  Chronic, stable. Cont Cardura 4 mg/d. Counseled on diet and exercise. Chronic, stable. Ck hep function today. Cont Lamisil as the new nail coming in appears normal.  F/u in 6 mo. The patient voiced understanding  and agreement to the plan.  [place], DO 04/02/21  3:22 PM

## 2021-04-03 ENCOUNTER — Other Ambulatory Visit: Payer: Self-pay | Admitting: Family Medicine

## 2021-04-03 DIAGNOSIS — E876 Hypokalemia: Secondary | ICD-10-CM

## 2021-04-03 LAB — COMPREHENSIVE METABOLIC PANEL
ALT: 18 U/L (ref 0–53)
AST: 20 U/L (ref 0–37)
Albumin: 4 g/dL (ref 3.5–5.2)
Alkaline Phosphatase: 52 U/L (ref 39–117)
BUN: 16 mg/dL (ref 6–23)
CO2: 26 mEq/L (ref 19–32)
Calcium: 9.1 mg/dL (ref 8.4–10.5)
Chloride: 99 mEq/L (ref 96–112)
Creatinine, Ser: 1.32 mg/dL (ref 0.40–1.50)
GFR: 51.83 mL/min — ABNORMAL LOW (ref >60.00)
Glucose, Bld: 145 mg/dL — ABNORMAL HIGH (ref 70–99)
Potassium: 3.4 mEq/L — ABNORMAL LOW (ref 3.5–5.1)
Sodium: 138 mEq/L (ref 135–145)
Total Bilirubin: 0.5 mg/dL (ref 0.2–1.2)
Total Protein: 7.3 g/dL (ref 6.0–8.3)

## 2021-04-03 LAB — HEPATITIS C ANTIBODY
Hepatitis C Ab: NONREACTIVE
SIGNAL TO CUT-OFF: 0.02 (ref <1.00)

## 2021-04-03 MED ORDER — POTASSIUM CHLORIDE ER 10 MEQ PO TBCR: EXTENDED_RELEASE_TABLET | ORAL | 2 refills | Status: DC

## 2021-04-05 ENCOUNTER — Telehealth: Payer: Self-pay | Admitting: *Deleted

## 2021-04-05 ENCOUNTER — Ambulatory Visit (INDEPENDENT_AMBULATORY_CARE_PROVIDER_SITE_OTHER): Payer: Medicare Other | Admitting: Internal Medicine

## 2021-04-05 ENCOUNTER — Encounter: Payer: Self-pay | Admitting: Internal Medicine

## 2021-04-05 VITALS — BP 126/84 | HR 100 | Ht 72.0 in | Wt 280.0 lb

## 2021-04-05 DIAGNOSIS — R194 Change in bowel habit: Secondary | ICD-10-CM

## 2021-04-05 DIAGNOSIS — K51919 Ulcerative colitis, unspecified with unspecified complications: Secondary | ICD-10-CM | POA: Diagnosis not present

## 2021-04-05 NOTE — Telephone Encounter (Signed)
OK to cancel hep function panel. Ty.

## 2021-04-05 NOTE — Telephone Encounter (Signed)
Pt has lab appointment on Tuesday for a basic metabolic panel. He also has a hepatic function panel ordered in August that has not been completed. Do you want that completed at his upcoming appointment or should we cancel that at this time?

## 2021-04-05 NOTE — Progress Notes (Signed)
HISTORY OF PRESENT ILLNESS:  Bill Fox is a 80 y.o. male with chronic left-sided ulcerative colitis.  He was last seen in the office February 08, 2021 regarding a recent flare of his colitis.  At that time he was improving on prednisone.  We continued with a prednisone taper.  He completed his prednisone last week.  He is also on Apriso 1.5 g daily.  He tells me that few weeks after his office visit his bowel habits returned to normal.  No further problems with diarrhea.  No rectal bleeding.  No abdominal pain.  He has had weight gain and lower extremity edema while on prednisone.  He tells me that over the past 4 days he has not had a bowel movement.  He does have a tendency toward constipation when he is not having active issues with his colitis.  No new abdominal complaints  REVIEW OF SYSTEMS:  All non-GI ROS negative.  Past Medical History:  Diagnosis Date   Anxiety    At risk for sleep apnea    STOP-BANG= 5     SENT TO PCP 11-29-2013   Cataract    Chronic fatigue    DJD (degenerative joint disease)    RIGHT KNEE   History of colon polyps    2011  &  2013 --  ADENOMATOUS   History of pulmonary embolism    Hyperlipidemia    pt denies ever having high chol   Hypertension    Right knee meniscal tear    Ulcerative colitis     Past Surgical History:  Procedure Laterality Date   CATARACT EXTRACTION W/ INTRAOCULAR LENS IMPLANT Left 2014   COLONOSCOPY W/ POLYPECTOMY  MULTIPLE--  LAST ONE 09-12-2011   KNEE ARTHROSCOPY WITH DRILLING/MICROFRACTURE Right 12/03/2013   Procedure: RIGHT KNEE ARTHROSCOPY PARTIAL MEDIAL MENISCECTOMY DEBRIDEMENT SYNOVECTOMY;  Surgeon: Johnn Hai, MD;  Location: [facility];  Service: Orthopedics;  Laterality: Right;   NASAL FRACTURE SURGERY  1985   TRANSTHORACIC ECHOCARDIOGRAM  11-01-2008   MILD LVH/  EF 58%/  GRADE I DIASTOLIC DYSFUNCTION/  MILD LAE    Social History [name]  reports that he quit smoking about 27 years  ago. His smoking use included cigarettes. He has a 30.00 pack-year smoking history. He has never used smokeless tobacco. He reports current alcohol use of about 1.0 standard drink per week. He reports that he does not use drugs.  family history includes Diabetes in his mother and paternal uncle; Lung cancer in his brother; Stomach cancer in his father.  No Known Allergies     PHYSICAL EXAMINATION: Vital signs: BP 126/84   Pulse 100   Ht 6' (1.829 m)   Wt 280 lb (127 kg)   BMI 37.97 kg/m   Constitutional: generally well-appearing, no acute distress Psychiatric: alert and oriented x3, cooperative Eyes: extraocular movements intact, anicteric, conjunctiva pink Mouth: oral pharynx moist, no lesions Neck: supple no lymphadenopathy Cardiovascular: heart regular rate and rhythm, no murmur Lungs: clear to auscultation bilaterally Abdomen: soft, obese, nontender, nondistended, no obvious ascites, no peritoneal signs, normal bowel sounds, no organomegaly Rectal: Omitted Extremities: no clubbing or cyanosis.  1+ lower extremity edema bilaterally Skin: no lesions on visible extremities Neuro: No focal deficits.  Cranial nerves intact  ASSESSMENT:  1.  Left-sided ulcerative colitis.  Currently asymptomatic after completing course of prednisone for recent flare.  Also on Apriso. 2.  History of DVT with pulmonary embolism on chronic anticoagulation therapy 3.  Multiple medical  problems.   PLAN:  1.  Continue Apriso 2.  MiraLAX as needed for constipation 3.  Routine GI follow-up 6 months.  Contact the office in the interim for any questions or problems 4.  Ongoing general medical care with PCP.

## 2021-04-05 NOTE — Patient Instructions (Signed)
If you are age 78 or older, your body mass index should be between 23-30. Your Body mass index is 37.97 kg/m. If this is out of the aforementioned range listed, please consider follow up with your Primary Care Provider.  If you are age 74 or younger, your body mass index should be between 19-25. Your Body mass index is 37.97 kg/m. If this is out of the aformentioned range listed, please consider follow up with your Primary Care Provider.   ________________________________________________________  The [facility] providers would like to encourage you to use [facility] to communicate with providers for non-urgent requests or questions.  Due to long hold times on the telephone, sending your provider a message by [facility] may be a faster and more efficient way to get a response.  Please allow 48 business hours for a response.  Please remember that this is for non-urgent requests.  _______________________________________________________  Please follow up in 6 months

## 2021-04-09 ENCOUNTER — Ambulatory Visit (INDEPENDENT_AMBULATORY_CARE_PROVIDER_SITE_OTHER): Payer: Medicare Other | Admitting: Podiatry

## 2021-04-09 ENCOUNTER — Other Ambulatory Visit: Payer: Self-pay

## 2021-04-09 DIAGNOSIS — L603 Nail dystrophy: Secondary | ICD-10-CM | POA: Diagnosis not present

## 2021-04-09 DIAGNOSIS — M79676 Pain in unspecified toe(s): Secondary | ICD-10-CM

## 2021-04-09 DIAGNOSIS — B351 Tinea unguium: Secondary | ICD-10-CM

## 2021-04-09 NOTE — Progress Notes (Signed)
  Subjective:  Patient ID: Bill Fox, male    DOB: [birth year],  MRN: [id number]  Chief Complaint  Patient presents with   lamisil    F/U lamisil check and nail fungus -pt states no improvement and toenails look the same. - Tx; lamisil ( no reaction)    80 y.o. male presents with the above complaint. History confirmed with patient.   Objective:  Physical Exam: warm, good capillary refill, no trophic changes or ulcerative lesions, normal DP and PT pulses, and normal sensory exam.  Nails x10 dystrophic several with transverse ridging, partial lysis distally.  No warmth no erythema no signs of acute infection noted. Assessment:   1. Nail dystrophy   2. Pain around toenail   3. Onychomycosis    Plan:  Patient was evaluated and treated and all questions answered.  Onychodystrophy -Again debrided nails courtesy today. There appears to be some improvement with lamisil. Advised patient complete treatment and f/u should he not notice full resolution and would like to discuss other options. As previously discussed some of these are likely traumatic change and may not resolve.  No follow-ups on file.

## 2021-04-10 ENCOUNTER — Other Ambulatory Visit (INDEPENDENT_AMBULATORY_CARE_PROVIDER_SITE_OTHER): Payer: Medicare Other

## 2021-04-10 DIAGNOSIS — E876 Hypokalemia: Secondary | ICD-10-CM

## 2021-04-11 ENCOUNTER — Other Ambulatory Visit: Payer: Self-pay | Admitting: Family Medicine

## 2021-04-11 DIAGNOSIS — E876 Hypokalemia: Secondary | ICD-10-CM

## 2021-04-11 LAB — BASIC METABOLIC PANEL
BUN: 15 mg/dL (ref 6–23)
CO2: 25 mEq/L (ref 19–32)
Calcium: 9.2 mg/dL (ref 8.4–10.5)
Chloride: 100 mEq/L (ref 96–112)
Creatinine, Ser: 1.53 mg/dL — ABNORMAL HIGH (ref 0.40–1.50)
GFR: 43.41 mL/min — ABNORMAL LOW (ref >60.00)
Glucose, Bld: 114 mg/dL — ABNORMAL HIGH (ref 70–99)
Potassium: 3.3 mEq/L — ABNORMAL LOW (ref 3.5–5.1)
Sodium: 139 mEq/L (ref 135–145)

## 2021-04-11 MED ORDER — POTASSIUM CHLORIDE ER 10 MEQ PO TBCR: EXTENDED_RELEASE_TABLET | ORAL | 2 refills | Status: DC

## 2021-04-11 NOTE — Telephone Encounter (Signed)
Orders were updated.

## 2021-04-16 ENCOUNTER — Other Ambulatory Visit (INDEPENDENT_AMBULATORY_CARE_PROVIDER_SITE_OTHER): Payer: Medicare Other

## 2021-04-16 ENCOUNTER — Other Ambulatory Visit: Payer: Self-pay

## 2021-04-16 DIAGNOSIS — E876 Hypokalemia: Secondary | ICD-10-CM | POA: Diagnosis not present

## 2021-04-17 ENCOUNTER — Other Ambulatory Visit: Payer: Self-pay | Admitting: Family Medicine

## 2021-04-17 LAB — BASIC METABOLIC PANEL
BUN: 16 mg/dL (ref 6–23)
CO2: 24 mEq/L (ref 19–32)
Calcium: 9.3 mg/dL (ref 8.4–10.5)
Chloride: 105 mEq/L (ref 96–112)
Creatinine, Ser: 1.57 mg/dL — ABNORMAL HIGH (ref 0.40–1.50)
GFR: 42.08 mL/min — ABNORMAL LOW (ref >60.00)
Glucose, Bld: 136 mg/dL — ABNORMAL HIGH (ref 70–99)
Potassium: 3.7 mEq/L (ref 3.5–5.1)
Sodium: 140 mEq/L (ref 135–145)

## 2021-04-17 LAB — MAGNESIUM: Magnesium: 1.9 mg/dL (ref 1.5–2.5)

## 2021-04-18 ENCOUNTER — Telehealth: Payer: Self-pay | Admitting: Family Medicine

## 2021-04-18 NOTE — Telephone Encounter (Signed)
Patient informed. 

## 2021-04-18 NOTE — Telephone Encounter (Signed)
The patient was not sure why he is taking lasik.

## 2021-04-20 ENCOUNTER — Ambulatory Visit: Payer: Medicare Other

## 2021-04-30 DIAGNOSIS — H26493 Other secondary cataract, bilateral: Secondary | ICD-10-CM | POA: Diagnosis not present

## 2021-06-05 ENCOUNTER — Telehealth: Payer: Self-pay | Admitting: Family Medicine

## 2021-06-05 ENCOUNTER — Telehealth: Payer: Self-pay

## 2021-06-05 MED ORDER — MIRTAZAPINE 30 MG PO TABS: 30.0000 mg | ORAL_TABLET | Freq: Every day | ORAL | 0 refills | Status: DC

## 2021-06-05 NOTE — Telephone Encounter (Signed)
Called left message to call back (called both numbers)

## 2021-06-05 NOTE — Telephone Encounter (Signed)
Done and patient informed

## 2021-06-05 NOTE — Telephone Encounter (Signed)
Pt would like for someone to give him a call to review meds he should be taken. Please advise.

## 2021-06-05 NOTE — Telephone Encounter (Signed)
Pt called stating he only has 3 pills left on his mirtazapine.  I advised pt a script was sent into his mail order pharmacy back in November, but he stated he never received it.  He is requesting a refill be sent to [facility] until the mail order pharmacy refill can be sorted out.

## 2021-06-05 NOTE — Telephone Encounter (Signed)
Called left message to call back 

## 2021-06-06 NOTE — Telephone Encounter (Signed)
Clarified medication instructions with the patient

## 2021-06-06 NOTE — Telephone Encounter (Signed)
Take daily as needed for swelling at this point, please change sig. Ty.

## 2021-06-06 NOTE — Telephone Encounter (Signed)
Patient returned call His wife had shredded his recent AVS and he wanted a list of his medications Emailed his med list to him at [email or web address]

## 2021-06-06 NOTE — Telephone Encounter (Signed)
Pt called again to review med list. Please advise.

## 2021-06-06 NOTE — Telephone Encounter (Signed)
The patient needs clarification on his lasix. He has his medication list but is still confused. His wife shredded his last AVS and I did read his instructions to him as well as instructions from the past few lab test results. But he is still confused regarding the lasix and potassium. He did say swelling is gone

## 2021-06-06 NOTE — Telephone Encounter (Signed)
Ok updated list. Called the patient left message to call back.

## 2021-06-11 ENCOUNTER — Telehealth: Payer: Self-pay | Admitting: Internal Medicine

## 2021-06-11 NOTE — Telephone Encounter (Signed)
Pt states that his medication(apriso) is not working. He reports he has been on it for about 3 months. He is c/o being full of gas and when he expels some of the gas there is often anal leakage with the gas. He is having about 3 BM's/week but is not happy with all the gas and leakage. Pt wants to know what Dr. Henrene Pastor recommends. Please advise.

## 2021-06-11 NOTE — Telephone Encounter (Signed)
2 tablespoons of Citrucel daily

## 2021-06-11 NOTE — Telephone Encounter (Signed)
Patient called and stated that his [place] is not working and that he is full of gas. Seeking advice, please advise.

## 2021-06-11 NOTE — Telephone Encounter (Signed)
Spoke with pt and he is aware.

## 2021-06-29 ENCOUNTER — Other Ambulatory Visit: Payer: Self-pay | Admitting: Internal Medicine

## 2021-07-02 ENCOUNTER — Telehealth: Payer: Self-pay | Admitting: Family Medicine

## 2021-07-02 NOTE — Telephone Encounter (Signed)
Pt would like for a copy of current medications emailed to him [email or web address]  Please advise.

## 2021-07-02 NOTE — Telephone Encounter (Signed)
Printed and emailed to the patient. Patient informed

## 2021-07-05 ENCOUNTER — Telehealth: Payer: Self-pay | Admitting: Family Medicine

## 2021-07-05 MED ORDER — POTASSIUM CHLORIDE ER 10 MEQ PO TBCR: EXTENDED_RELEASE_TABLET | ORAL | 2 refills | Status: DC

## 2021-07-05 MED ORDER — FUROSEMIDE 40 MG PO TABS: 40.0000 mg | ORAL_TABLET | Freq: Two times a day (BID) | ORAL | 3 refills | Status: DC

## 2021-07-05 NOTE — Telephone Encounter (Signed)
Patient would like to know if he is supposed to continue taking his lasix and potassium, or if he was supposed to stop them. If he needs to continue taking them, he would like a refill sent to his pharmacy. Please advise.    [address], [place], [place] [address]  Phone:  [phone]  Fax:  [phone]

## 2021-07-05 NOTE — Telephone Encounter (Signed)
Pt notified of refill and for purpose of prn swelling

## 2021-07-05 NOTE — Telephone Encounter (Signed)
It's an as needed use for swelling medication at this point. OK to refill if he needs. Ty.

## 2021-07-18 ENCOUNTER — Ambulatory Visit (INDEPENDENT_AMBULATORY_CARE_PROVIDER_SITE_OTHER): Payer: Medicare Other | Admitting: Family Medicine

## 2021-07-18 ENCOUNTER — Encounter: Payer: Self-pay | Admitting: Family Medicine

## 2021-07-18 VITALS — BP 110/72 | HR 93 | Temp 98.0°F | Ht 72.0 in | Wt 258.2 lb

## 2021-07-18 DIAGNOSIS — M7989 Other specified soft tissue disorders: Secondary | ICD-10-CM

## 2021-07-18 DIAGNOSIS — K51919 Ulcerative colitis, unspecified with unspecified complications: Secondary | ICD-10-CM

## 2021-07-18 DIAGNOSIS — B351 Tinea unguium: Secondary | ICD-10-CM | POA: Diagnosis not present

## 2021-07-18 DIAGNOSIS — I509 Heart failure, unspecified: Secondary | ICD-10-CM | POA: Diagnosis not present

## 2021-07-18 DIAGNOSIS — R0609 Other forms of dyspnea: Secondary | ICD-10-CM | POA: Diagnosis not present

## 2021-07-18 LAB — COMPREHENSIVE METABOLIC PANEL
ALT: 8 U/L (ref 0–53)
AST: 13 U/L (ref 0–37)
Albumin: 4.2 g/dL (ref 3.5–5.2)
Alkaline Phosphatase: 59 U/L (ref 39–117)
BUN: 24 mg/dL — ABNORMAL HIGH (ref 6–23)
CO2: 26 mEq/L (ref 19–32)
Calcium: 9.1 mg/dL (ref 8.4–10.5)
Chloride: 101 mEq/L (ref 96–112)
Creatinine, Ser: 1.52 mg/dL — ABNORMAL HIGH (ref 0.40–1.50)
GFR: 43.67 mL/min — ABNORMAL LOW (ref >60.00)
Glucose, Bld: 99 mg/dL (ref 70–99)
Potassium: 4 mEq/L (ref 3.5–5.1)
Sodium: 138 mEq/L (ref 135–145)
Total Bilirubin: 0.4 mg/dL (ref 0.2–1.2)
Total Protein: 7.8 g/dL (ref 6.0–8.3)

## 2021-07-18 MED ORDER — FUROSEMIDE 40 MG PO TABS: ORAL_TABLET | ORAL | 3 refills | Status: DC

## 2021-07-18 NOTE — Patient Instructions (Addendum)
Give [place] 2-3 business days to get the results of your labs back.  ? ?Keep the diet clean and stay active. ? ?Use the Lasix as needed for swelling. Take 2 tabs of the potassium with each dose of the Lasix.  ? ?Let us know if you need anything. ?

## 2021-07-18 NOTE — Progress Notes (Signed)
Chief Complaint  ?Patient presents with  ? Follow-up  ? ? ?Subjective: ?Patient is a 81 y.o. male here for f/u. ? ?Patient with history of chronic lower extremity swelling.  He takes Lasix 40 mg twice daily as needed for swelling.  He is only been taking 10 mEq of potassium.  Unsure if this is something he should change.  He feels fine otherwise.  Swelling has gone down since stopping the long prednisone taper for ulcerative colitis. ? ?Patient has a history of onychomycosis of all toenails.  He has been taking Lamisil 250 mg daily.  He reports compliance and no adverse effects.  Liver function is thus far been normal.  The new nail that is growing in appears to be normal.  He still has thickened and yellowed nails. ? ?Past Medical History:  ?Diagnosis Date  ? Anxiety   ? At risk for sleep apnea   ? STOP-BANG= 5     SENT TO PCP 11-29-2013  ? Cataract   ? Chronic fatigue   ? DJD (degenerative joint disease)   ? RIGHT KNEE  ? History of colon polyps   ? 2011  &  2013 --  ADENOMATOUS  ? History of pulmonary embolism   ? Hyperlipidemia   ? pt denies ever having high chol  ? Hypertension   ? Right knee meniscal tear   ? Ulcerative colitis   ? ? ?Objective: ?BP 110/72   Pulse 93   Temp 98 ?F (36.7 ?C) (Oral)   Ht 6' (1.829 m)   Wt 258 lb 4 oz (117.1 kg)   SpO2 94%   BMI 35.02 kg/m?  ?General: Awake, appears stated age ?Heart: RRR, 1+ pitting b/l LE edema tapering at mid tibia ?Lungs: CTAB, no rales, wheezes or rhonchi. No accessory muscle use ?Skin: thickened and yellowed nails growing out from each toe b/l; no pain or erythema of periungual skin ?Psych: Age appropriate judgment and insight, normal affect and mood ? ?Assessment and Plan: ?Leg swelling - Plan: Basic metabolic panel ? ?Onychomycosis ? ?Congestive heart failure, unspecified HF chronicity, unspecified heart failure type ([place]), Chronic ? ?Chronic ulcerative colitis, unspecified complication ([place]), Chronic ? ?Chronic, stable. Working w cardiology  regarding [place]. Cont Lasix 40 mg bid prn. Klor con 20 mEq w every dose of 40 mg. Elevating legs, wt loss and physical activity will help as well. ?Chronic, stable at this tsage. Cont Lamisil 250 mg/d. Monitor liver function. ?F/u in 6 mo or prn.  ?The patient voiced understanding and agreement to the plan. ? ?Bill Pal, DO ?07/18/21  ?10:05 AM ? ? ? ? ?

## 2021-07-30 ENCOUNTER — Telehealth: Payer: Self-pay | Admitting: Family Medicine

## 2021-07-30 NOTE — Telephone Encounter (Signed)
Pt would like CMA to contact him regarding coming off meds  ? ?Pt states swelling has gone away  ? ? ?Please advise  ?

## 2021-07-30 NOTE — Telephone Encounter (Signed)
He would like to discontinue lasix as swelling has gone from legs and feet. ?The patient states he has been taking every day, (instructions state take only prn for swelling). ?

## 2021-07-30 NOTE — Telephone Encounter (Signed)
Both medications removed ?Called the patient and informed as well. ?

## 2021-08-08 ENCOUNTER — Other Ambulatory Visit: Payer: Self-pay | Admitting: Family Medicine

## 2021-08-08 DIAGNOSIS — H26493 Other secondary cataract, bilateral: Secondary | ICD-10-CM | POA: Diagnosis not present

## 2021-08-08 DIAGNOSIS — Z961 Presence of intraocular lens: Secondary | ICD-10-CM | POA: Diagnosis not present

## 2021-08-14 ENCOUNTER — Ambulatory Visit: Payer: Medicare Other | Admitting: Pulmonary Disease

## 2021-09-05 ENCOUNTER — Other Ambulatory Visit: Payer: Self-pay | Admitting: Internal Medicine

## 2021-09-25 ENCOUNTER — Ambulatory Visit (INDEPENDENT_AMBULATORY_CARE_PROVIDER_SITE_OTHER): Payer: Medicare Other | Admitting: Pulmonary Disease

## 2021-09-25 ENCOUNTER — Encounter: Payer: Self-pay | Admitting: Pulmonary Disease

## 2021-09-25 VITALS — BP 128/70 | HR 99 | Temp 98.0°F | Ht 78.0 in | Wt 259.8 lb

## 2021-09-25 DIAGNOSIS — R0609 Other forms of dyspnea: Secondary | ICD-10-CM

## 2021-09-25 DIAGNOSIS — E663 Overweight: Secondary | ICD-10-CM | POA: Diagnosis not present

## 2021-09-25 NOTE — Patient Instructions (Addendum)
Nice to see you again ? ?Your pulmonary function test performed today were all normal, this is great news.  This indicates the lungs are functioning well and are very healthy. ? ?I think the best thing we can do for your shortness of breath is to slowly increase her physical activity.  I recommend walking on the treadmill for 5 minutes twice a day.  Increase this by 2 to 3 minutes every 2 weeks.  Once you get to 10 minutes at a time, you can consider only doing once a day.  Pick a goal of time you like to be able to walk and shoot for that.  Increasing 2 to 3 minutes every 2 weeks.  It may take a few months but I think you will be able to get to where you are walking more comfortably and with less symptoms. ? ?I sent a referral to a dietician in [place].  ? ?Return to clinic in 6 months or sooner as needed with Dr. Silas Flood ?

## 2021-09-25 NOTE — Progress Notes (Signed)
? ?@Patient  ID: Bill Fox, male    DOB: July 26, 1940, 81 y.o.   MRN: [id number] ? ?Chief Complaint  ?Patient presents with  ? Follow-up  ?  Pt states that his breathing is fair when he is up walking around. Pt had full PFTs done today. Pt is not currently on any inhalers   ? ? ?Referring provider: ?[name]* ? ?HPI:  ? ?81 y.o. man whom with history of recurrent PE whom we are seeing in follow-up for DOE. ? ?Overall, he is doing okay. PFT today reviewed and explained to patient in detail. PFTs normal.  He continues to have baseline dyspnea.  He is very inactive.  Used to walk to his mailbox.  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  Discussed in detail role and rationale for graduated exercise program and effort to improve his symptoms. ? ? ? ?Questionaires / Pulmonary Flowsheets:  ? ?ACT:  ?   ? View : No data to display.  ?  ?  ?  ? ? ?MMRC: ?   ? View : No data to display.  ?  ?  ?  ? ? ?[place]:  ?   ? View : No data to display.  ?  ?  ?  ? ? ?Tests:  ? ?FENO:  ?No results found for: NITRICOXIDE ? ?PFT: ?   ? View : No data to display.  ?  ?  ?  ?09/25/2021 personally reviewed and interpreted as normal spirometry, no bronchodilator response, lung volumes within normal limits, DLCO within normal limits. ? ?WALK:  ?   ? View : No data to display.  ?  ?  ?  ? ? ?Imaging: ?Personally reviewed and as per EMR discussion of this note ?No results found. ? ?Lab Results: ?Personally reviewed ?CBC ?   ?Component Value Date/Time  ? WBC 9.0 01/02/2021 1419  ? RBC 4.82 01/02/2021 1419  ? HGB 14.1 01/02/2021 1419  ? HCT 42.8 01/02/2021 1419  ? PLT 209.0 01/02/2021 1419  ? MCV 88.7 01/02/2021 1419  ? MCH 28.2 11/25/2020 0426  ? MCHC 32.9 01/02/2021 1419  ? RDW 17.7 (H) 01/02/2021 1419  ? LYMPHSABS 2.4 12/20/2020 1325  ? MONOABS 0.8 12/20/2020 1325  ? EOSABS 0.1 12/20/2020 1325  ? BASOSABS 0.0 12/20/2020 1325  ? ? ?BMET ?   ?Component Value Date/Time  ? NA 138 07/18/2021 1015  ? K 4.0 07/18/2021 1015  ? CL 101 07/18/2021 1015  ?  CO2 26 07/18/2021 1015  ? GLUCOSE 99 07/18/2021 1015  ? BUN 24 (H) 07/18/2021 1015  ? CREATININE 1.52 (H) 07/18/2021 1015  ? CALCIUM 9.1 07/18/2021 1015  ? GFRNONAA >60 11/25/2020 0426  ? GFRAA >60 07/12/2015 5465  ? ? ?BNP ?   ?Component Value Date/Time  ? BNP 42.9 11/20/2020 1608  ? ? ?ProBNP ?   ?Component Value Date/Time  ? PROBNP <30.0 07/06/2008 0941  ? ? ?Specialty Problems   ? ?  ? Pulmonary Problems  ? Cough  ?    ? ?  ?  ? Solitary pulmonary nodule  ?  First seen L [facility] 01/30/2015 > CT chest 02/06/15 > no nodule ? ?  ?  ? ? ?No Known Allergies ? ?Immunization History  ?Administered Date(s) Administered  ? Influenza Split 02/18/2016  ? Influenza Whole 02/16/2009, 01/31/2010  ? Influenza, High Dose Seasonal PF 03/11/2018  ? Influenza,inj,Quad PF,6+ Mos 03/03/2014, 01/30/2015  ? Influenza-Unspecified 03/12/2021  ? Janssen (J&J) SARS-COV-2 Vaccination 07/29/2019  ? Moderna Covid-19  Vaccine Bivalent Booster 98yr & up 03/02/2021  ? Moderna Sars-Covid-2 Vaccination 05/22/2020, 11/19/2020  ? Pneumococcal Conjugate-13 01/30/2015  ? Pneumococcal Polysaccharide-23 06/20/2008  ? Tdap 09/03/2011  ? ? ?Past Medical History:  ?Diagnosis Date  ? Anxiety   ? At risk for sleep apnea   ? STOP-BANG= 5     SENT TO PCP 11-29-2013  ? Cataract   ? Chronic fatigue   ? DJD (degenerative joint disease)   ? RIGHT KNEE  ? History of colon polyps   ? 2011  &  2013 --  ADENOMATOUS  ? History of pulmonary embolism   ? Hyperlipidemia   ? pt denies ever having high chol  ? Hypertension   ? Right knee meniscal tear   ? Ulcerative colitis   ? ? ?Tobacco History: ?Social History  ? ?Tobacco Use  ?Smoking Status Former  ? Packs/day: 1.00  ? Years: 30.00  ? Pack years: 30.00  ? Types: Cigarettes  ? Quit date: 05/20/1993  ? Years since quitting: 28.3  ?Smokeless Tobacco Never  ? ?Counseling given: Not Answered ? ? ?Continue to not smoke ? ?Outpatient Encounter Medications as of 09/25/2021  ?Medication Sig  ? doxazosin (CARDURA) 8 MG tablet TAKE  ONE-HALF (1/2) TABLET AT BEDTIME  ? ELIQUIS 5 MG TABS tablet TAKE 2 TABLETS TWICE A DAY FOR 5 DAYS, THEN 1 TABLET TWICE A DAY THEREAFTER  ? furosemide (LASIX) 40 MG tablet Take 40 mg by mouth 2 (two) times daily.  ? mesalamine (APRISO) 0.375 g 24 hr capsule Take 4 capsules (1.5 g total) by mouth daily.  ? mirtazapine (REMERON) 30 MG tablet Take 1 tablet (30 mg total) by mouth at bedtime.  ? terbinafine (LAMISIL) 250 MG tablet Take 1 tablet (250 mg total) by mouth daily.  ? ?No facility-administered encounter medications on file as of 09/25/2021.  ? ? ? ?Review of Systems ? ?Review of Systems  ?N/a ?Physical Exam ? ?BP 128/70 (BP Location: Left Arm, Patient Position: Sitting, Cuff Size: Normal)   Pulse 99   Temp 98 ?F (36.7 ?C) (Oral)   Ht 6' 6"  (1.981 m)   Wt 259 lb 12.8 oz (117.8 kg)   SpO2 95%   BMI 30.02 kg/m?  ? ?Wt Readings from Last 5 Encounters:  ?09/25/21 259 lb 12.8 oz (117.8 kg)  ?07/18/21 258 lb 4 oz (117.1 kg)  ?04/05/21 280 lb (127 kg)  ?04/02/21 280 lb 2 oz (127.1 kg)  ?02/13/21 270 lb 12.8 oz (122.8 kg)  ? ? ?BMI Readings from Last 5 Encounters:  ?09/25/21 30.02 kg/m?  ?07/18/21 35.02 kg/m?  ?04/05/21 37.97 kg/m?  ?04/02/21 37.99 kg/m?  ?02/13/21 36.73 kg/m?  ? ? ? ?Physical Exam ? ?General: Well-appearing, no distress ?Eyes: EOMI, no icterus ?Neck: Supple, no JVP ?Pulmonary: No work of breathing, clear ?Cardiovascular regular rate and rhythm, no murmur ?Extremities: Warm, no edema ?MSK: No synovitis, no joint effusion ? ?Assessment & Plan:  ? ?Submassive PE: Seems unprovoked, in setting of UC flare so possible hyperinflammatory state as predisposing factor.  Obesity also a risk factor.  2nd PE, had one many years ago with Pna and relative immobility in hospital. Recommend lifelong AC, discussed with patient. May difficult given UC and intermittent hematochezia. We will have to play by ear and be flexible.  Repeat TTE with improvement and RV size and function.   ? ?Dyspnea on exertion: Likely  multifactorial.  High suspicion for deconditioning.  Obesity also contributing.  Increasing weight gain on prednisone for  UC.  PFT today's 09/25/2021 normal.  Recommended graduated exercise program to help with dyspnea on exertion. ? ?Return in about 6 months (around 03/28/2022). ? ? ?Lanier Clam, MD ?09/25/2021 ? ? ? ?

## 2021-09-25 NOTE — Progress Notes (Signed)
PFT done today. 

## 2021-09-28 ENCOUNTER — Other Ambulatory Visit: Payer: Self-pay

## 2021-09-28 DIAGNOSIS — E663 Overweight: Secondary | ICD-10-CM

## 2021-09-28 DIAGNOSIS — Z713 Dietary counseling and surveillance: Secondary | ICD-10-CM

## 2021-10-18 ENCOUNTER — Ambulatory Visit: Payer: Medicare Other

## 2021-10-18 ENCOUNTER — Ambulatory Visit (INDEPENDENT_AMBULATORY_CARE_PROVIDER_SITE_OTHER): Payer: Medicare Other

## 2021-10-18 ENCOUNTER — Other Ambulatory Visit: Payer: Self-pay | Admitting: Family Medicine

## 2021-10-18 VITALS — Temp 98.2°F | Ht 72.0 in | Wt 256.8 lb

## 2021-10-18 DIAGNOSIS — Z0000 Encounter for general adult medical examination without abnormal findings: Secondary | ICD-10-CM | POA: Diagnosis not present

## 2021-10-18 NOTE — Progress Notes (Signed)
Subjective:   Bill Fox is a 81 y.o. male who presents for an Initial Medicare Annual Wellness Visit.  Review of Systems     Cardiac Risk Factors include: advanced age (>70mn, >>65women);hypertension;dyslipidemia     Objective:    Today's Vitals   10/18/21 1501  Temp: 98.2 F (36.8 C)  Weight: 256 lb 12.8 oz (116.5 kg)  Height: 6' (1.829 m)   Body mass index is 34.83 kg/m.     10/18/2021    3:10 PM 11/21/2020   10:19 AM 11/20/2020    2:13 PM 10/12/2020    2:47 PM 09/09/2019    9:34 AM 09/03/2018    9:17 AM 09/01/2017    9:24 AM  Advanced Directives  Does Patient Have a Medical Advance Directive? Yes No No No No No No  [place]pink MOST or yellow form);Living will        Does patient want to make changes to medical advance directive? No - Patient declined        Copy of [place] Chart? No - copy requested        Would patient like information on creating a medical advance directive?  No - Patient declined  Yes (MAU/Ambulatory/Procedural Areas - Information given) No - Patient declined Yes (MAU/Ambulatory/Procedural Areas - Information given) Yes (MAU/Ambulatory/Procedural Areas - Information given)    Current Medications (verified) Outpatient Encounter Medications as of 10/18/2021  Medication Sig   doxazosin (CARDURA) 8 MG tablet TAKE ONE-HALF (1/2) TABLET AT BEDTIME   ELIQUIS 5 MG TABS tablet TAKE 2 TABLETS TWICE A DAY FOR 5 DAYS, THEN 1 TABLET TWICE A DAY THEREAFTER   furosemide (LASIX) 40 MG tablet Take 40 mg by mouth 2 (two) times daily.   mesalamine (APRISO) 0.375 g 24 hr capsule Take 4 capsules (1.5 g total) by mouth daily.   mirtazapine (REMERON) 30 MG tablet Take 1 tablet (30 mg total) by mouth at bedtime.   terbinafine (LAMISIL) 250 MG tablet Take 1 tablet (250 mg total) by mouth daily.   No facility-administered encounter medications on file as of 10/18/2021.     Allergies (verified) Patient has no known allergies.   History: Past Medical History:  Diagnosis Date   Anxiety    At risk for sleep apnea    STOP-BANG= 5     SENT TO PCP 11-29-2013   Cataract    Chronic fatigue    DJD (degenerative joint disease)    RIGHT KNEE   History of colon polyps    2011  &  2013 --  ADENOMATOUS   History of pulmonary embolism    Hyperlipidemia    pt denies ever having high chol   Hypertension    Right knee meniscal tear    Ulcerative colitis    Past Surgical History:  Procedure Laterality Date   CATARACT EXTRACTION W/ INTRAOCULAR LENS IMPLANT Left 2014   COLONOSCOPY W/ POLYPECTOMY  MULTIPLE--  LAST ONE 09-12-2011   KNEE ARTHROSCOPY WITH DRILLING/MICROFRACTURE Right 12/03/2013   Procedure: RIGHT KNEE ARTHROSCOPY PARTIAL MEDIAL MENISCECTOMY DEBRIDEMENT SYNOVECTOMY;  Surgeon: JJohnn Hai MD;  [facility]  Service: Orthopedics;  Laterality: Right;   NASAL FRACTURE SURGERY  1985   TRANSTHORACIC ECHOCARDIOGRAM  11-01-2008   MILD LVH/  EF 562%/ GRADE I DIASTOLIC DYSFUNCTION/  MILD LAE   Family History  Problem Relation Age of Onset   Diabetes Mother  Stomach cancer Father    Lung cancer Brother    Diabetes Paternal Uncle        x 2   Colon cancer Neg Hx    Pancreatic cancer Neg Hx    Esophageal cancer Neg Hx    Colon polyps Neg Hx    Social History   Socioeconomic History   Marital status: Married    Spouse name: Not on file   Number of children: Not on file   Years of education: Not on file   Highest education level: Not on file  Occupational History   Occupation: retired  Tobacco Use   Smoking status: Former    Packs/day: 1.00    Years: 30.00    Pack years: 30.00    Types: Cigarettes    Quit date: 05/20/1993    Years since quitting: 28.4   Smokeless tobacco: Never  Vaping Use   Vaping Use: Never used  Substance and Sexual Activity   Alcohol use: Yes    Alcohol/week: 1.0 standard drink    Types:  1 Cans of beer per week    Comment: beer on occ   Drug use: No   Sexual activity: Yes  Other Topics Concern   Not on file  Social History Narrative   Not on file   Social Determinants of Health   Financial Resource Strain: Low Risk    Difficulty of Paying Living Expenses: Not hard at all  Food Insecurity: No Food Insecurity   Worried About Charity fundraiser in the Last Year: Never true   [place] in the Last Year: Never true  Transportation Needs: No Transportation Needs   Lack of Transportation (Medical): No   Lack of Transportation (Non-Medical): No  Physical Activity: Insufficiently Active   Days of Exercise per Week: 7 days   Minutes of Exercise per Session: 20 min  Stress: No Stress Concern Present   Feeling of Stress : Not at all  Social Connections: Moderately Integrated   Frequency of Communication with Friends and Family: More than three times a week   Frequency of Social Gatherings with Friends and Family: More than three times a week   Attends Religious Services: More than 4 times per year   Active Member of [employer] or Organizations: Yes   Attends Music therapist: More than 4 times per year   Marital Status: Divorced    Tobacco Counseling Counseling given: Not Answered   Clinical Intake:  Pre-visit preparation completed: Yes  Pain : No/denies pain     Nutritional Risks: None Diabetes: No  How often do you need to have someone help you when you read instructions, pamphlets, or other written materials from your doctor or pharmacy?: 1 - Never  Diabetic?No  Interpreter Needed?: No  Information entered by :: [place] [facility] Daily Living    10/18/2021    3:16 PM 11/21/2020   10:20 AM  In your present state of health, do you have any difficulty performing the following activities:  Hearing? 0 0  Vision? 0 0  Difficulty concentrating or making decisions? 0 0  Walking or climbing stairs? 0 1  Comment  secondary to  shortness ofbreath  Dressing or bathing? 0 0  Doing errands, shopping? 0 0  Preparing Food and eating ? N   Using the Toilet? N   In the past six months, have you accidently leaked urine? N   Do you have problems with loss of bowel  control? Y   Managing your Medications? N   Managing your Finances? N   Housekeeping or managing your Housekeeping? N     Patient Care Team: [name], DO as PCP - General (Family Medicine)  Indicate any recent Medical Services you may have received from other than [facility] providers in the past year (date may be approximate).     Assessment:   This is a routine wellness examination for Bill Fox.  Hearing/Vision screen No results found.  Dietary issues and exercise activities discussed: Current Exercise Habits: Home exercise routine, Type of exercise: walking, Time (Minutes): 20, Frequency (Times/Week): 7, Weekly Exercise (Minutes/Week): 140, Intensity: Mild, Exercise limited by: None identified   Goals Addressed             This Visit's Progress    Patient Stated   On track    Drink more water       Depression Screen    10/18/2021    3:14 PM 10/12/2020    2:52 PM 09/09/2019    9:39 AM 09/03/2018    9:18 AM 09/01/2017    9:32 AM  PHQ 2/9 Scores  PHQ - 2 Score 0 0 0 0 0    Fall Risk    10/18/2021    3:12 PM 10/12/2020    2:51 PM 09/09/2019    9:39 AM 09/03/2018    9:18 AM 09/01/2017    9:32 AM  [place] in the past year? 1 0 1 0 No  Number falls in past yr: 0 0 0    Injury with Fall? 0 0 1    Risk for fall due to : History of fall(s)      Follow up Falls evaluation completed Falls prevention discussed Education provided;Falls prevention discussed      FALL RISK PREVENTION PERTAINING TO THE HOME:  Any stairs in or around the home? Yes  If so, are there any without handrails? No  Home free of loose throw rugs in walkways, pet beds, electrical cords, etc? Yes  Adequate lighting in your home to reduce risk of falls?  Yes   ASSISTIVE DEVICES UTILIZED TO PREVENT FALLS:  Life alert? No  Use of a cane, walker or w/c? No  Grab bars in the bathroom? Yes  Shower chair or bench in shower? No  Elevated toilet seat or a handicapped toilet? Yes   TIMED UP AND GO:  Was the test performed? Yes .  Length of time to ambulate 10 feet: 10 sec.   Gait steady and fast without use of assistive device  Cognitive Function:    09/01/2017    9:33 AM  MMSE - Mini Mental State Exam  Orientation to time 5  Orientation to Place 5  Registration 3  Attention/ Calculation 5  Recall 3  Language- name 2 objects 2  Language- repeat 1  Language- follow 3 step command 3  Language- read & follow direction 1  Write a sentence 1  Copy design 1  Total score 30        10/18/2021    3:35 PM 10/18/2021    3:22 PM  6CIT Screen  What Year? 0 points 0 points  What month? 0 points 0 points  What time? 0 points 0 points  Count back from 20 0 points 0 points  Months in reverse 0 points 0 points  Repeat phrase 10 points 10 points  Total Score 10 points 10 points    Immunizations Immunization History  Administered Date(s) Administered   Influenza Split 02/18/2016   Influenza Whole 02/16/2009, 01/31/2010   Influenza, High Dose Seasonal PF 03/11/2018   Influenza,inj,Quad PF,6+ Mos 03/03/2014, 01/30/2015   Influenza-Unspecified 03/12/2021   Janssen (J&J) SARS-COV-2 Vaccination 07/29/2019   Moderna Covid-19 Vaccine Bivalent Booster 57yr & up 03/02/2021   Moderna Sars-Covid-2 Vaccination 05/22/2020, 11/19/2020   Pneumococcal Conjugate-13 01/30/2015   Pneumococcal Polysaccharide-23 06/20/2008   Tdap 09/03/2011    TDAP status: Due, Education has been provided regarding the importance of this vaccine. Advised may receive this vaccine at local pharmacy or Health Dept. Aware to provide a copy of the vaccination record if obtained from local pharmacy or Health Dept. Verbalized acceptance and understanding.  Flu Vaccine status:  Up to date  Pneumococcal vaccine status: Up to date  Covid-19 vaccine status: Completed vaccines  Qualifies for Shingles Vaccine? Yes   Zostavax completed No   Shingrix Completed?: No.    Education has been provided regarding the importance of this vaccine. Patient has been advised to call insurance company to determine out of pocket expense if they have not yet received this vaccine. Advised may also receive vaccine at local pharmacy or Health Dept. Verbalized acceptance and understanding.  Screening Tests Health Maintenance  Topic Date Due   Zoster Vaccines- Shingrix (1 of 2) Never done   TETANUS/TDAP  09/02/2021   INFLUENZA VACCINE  12/18/2021   COLONOSCOPY (Pts 45-440yrInsurance coverage will need to be confirmed)  05/31/2023   Pneumonia Vaccine 81Years old  Completed   COVID-19 Vaccine  Completed   Hepatitis C Screening  Completed   HPV VACCINES  Aged Out    Health Maintenance  Health Maintenance Due  Topic Date Due   Zoster Vaccines- Shingrix (1 of 2) Never done   TETANUS/TDAP  09/02/2021    Colorectal cancer screening: Type of screening: Colonoscopy. Completed 05/30/20. Repeat every 3 years  Lung Cancer Screening: (Low Dose CT Chest recommended if Age 79-80ears, 30 pack-year currently smoking OR have quit w/in 15years.) does not qualify.   Lung Cancer Screening Referral: N/A  Additional Screening:  Hepatitis C Screening: does qualify; Completed 04/02/21  Vision Screening: Recommended annual ophthalmology exams for early detection of glaucoma and other disorders of the eye. Is the patient up to date with their annual eye exam?  Yes  Who is the provider or what is the name of the office in which the patient attends annual eye exams? [facility], would they like to be referred to a provider to establish care? No .   Dental Screening: Recommended annual dental exams for proper oral hygiene  Community Resource  Referral / Chronic Care Management: CRR required this visit?  No   CCM required this visit?  No      Plan:     I have personally reviewed and noted the following in the patient's chart:   Medical and social history Use of alcohol, tobacco or illicit drugs  Current medications and supplements including opioid prescriptions. Patient is not currently taking opioid prescriptions. Functional ability and status Nutritional status Physical activity Advanced directives List of other physicians Hospitalizations, surgeries, and ER visits in previous 12 months Vitals Screenings to include cognitive, depression, and falls Referrals and appointments  In addition, I have reviewed and discussed with patient certain preventive protocols, quality metrics, and best practice recommendations. A written personalized care plan for preventive services as well as general preventive health recommendations were provided to  patient.     Duard Brady Zamira Hickam, CMA   10/18/2021   Nurse Notes: none

## 2021-10-18 NOTE — Patient Instructions (Signed)
Bill Fox , Thank you for taking time to come for your Medicare Wellness Visit. I appreciate your ongoing commitment to your health goals. Please review the following plan we discussed and let me know if I can assist you in the future.   Screening recommendations/referrals: Colonoscopy: 05/30/20 due 05/31/23 Recommended yearly ophthalmology/optometry visit for glaucoma screening and checkup Recommended yearly dental visit for hygiene and checkup  Vaccinations: Influenza vaccine: up to date Pneumococcal vaccine: up to date Tdap vaccine: Due-May obtain vaccine at your local pharmacy.  Shingles vaccine: Due-May obtain vaccine at your local pharmacy.    Covid-19: completed  Advanced directives: yes, not on file  Conditions/risks identified: see problem list   Next appointment: Follow up in one year for your annual wellness visit.   Preventive Care 79 Years and Older, Male Preventive care refers to lifestyle choices and visits with your health care provider that can promote health and wellness. What does preventive care include? A yearly physical exam. This is also called an annual well check. Dental exams once or twice a year. Routine eye exams. Ask your health care provider how often you should have your eyes checked. Personal lifestyle choices, including: Daily care of your teeth and gums. Regular physical activity. Eating a healthy diet. Avoiding tobacco and drug use. Limiting alcohol use. Practicing safe sex. Taking low doses of aspirin every day. Taking vitamin and mineral supplements as recommended by your health care provider. What happens during an annual well check? The services and screenings done by your health care provider during your annual well check will depend on your age, overall health, lifestyle risk factors, and family history of disease. Counseling  Your health care provider may ask you questions about your: Alcohol use. Tobacco use. Drug use. Emotional  well-being. Home and relationship well-being. Sexual activity. Eating habits. History of falls. Memory and ability to understand (cognition). Work and work Statistician. Screening  You may have the following tests or measurements: Height, weight, and BMI. Blood pressure. Lipid and cholesterol levels. These may be checked every 5 years, or more frequently if you are over 68 years old. Skin check. Lung cancer screening. You may have this screening every year starting at age 46 if you have a 30-pack-year history of smoking and currently smoke or have quit within the past 15 years. Fecal occult blood test (FOBT) of the stool. You may have this test every year starting at age 82. Flexible sigmoidoscopy or colonoscopy. You may have a sigmoidoscopy every 5 years or a colonoscopy every 10 years starting at age 21. Prostate cancer screening. Recommendations will vary depending on your family history and other risks. Hepatitis C blood test. Hepatitis B blood test. Sexually transmitted disease (STD) testing. Diabetes screening. This is done by checking your blood sugar (glucose) after you have not eaten for a while (fasting). You may have this done every 1-3 years. Abdominal aortic aneurysm (AAA) screening. You may need this if you are a current or former smoker. Osteoporosis. You may be screened starting at age 72 if you are at high risk. Talk with your health care provider about your test results, treatment options, and if necessary, the need for more tests. Vaccines  Your health care provider may recommend certain vaccines, such as: Influenza vaccine. This is recommended every year. Tetanus, diphtheria, and acellular pertussis (Tdap, Td) vaccine. You may need a Td booster every 10 years. Zoster vaccine. You may need this after age 73. Pneumococcal 13-valent conjugate (PCV13) vaccine. One dose is recommended  after age 20. Pneumococcal polysaccharide (PPSV23) vaccine. One dose is recommended after  age 79. Talk to your health care provider about which screenings and vaccines you need and how often you need them. This information is not intended to replace advice given to you by your health care provider. Make sure you discuss any questions you have with your health care provider. Document Released: 06/02/2015 Document Revised: 01/24/2016 Document Reviewed: 03/07/2015 Elsevier Interactive Patient Education  2017 [place] Prevention in the Home Falls can cause injuries. They can happen to people of all ages. There are many things you can do to make your home safe and to help prevent falls. What can I do on the outside of my home? Regularly fix the edges of walkways and driveways and fix any cracks. Remove anything that might make you trip as you walk through a door, such as a raised step or threshold. Trim any bushes or trees on the path to your home. Use bright outdoor lighting. Clear any walking paths of anything that might make someone trip, such as rocks or tools. Regularly check to see if handrails are loose or broken. Make sure that both sides of any steps have handrails. Any raised decks and porches should have guardrails on the edges. Have any leaves, snow, or ice cleared regularly. Use sand or salt on walking paths during winter. Clean up any spills in your garage right away. This includes oil or grease spills. What can I do in the bathroom? Use night lights. Install grab bars by the toilet and in the tub and shower. Do not use towel bars as grab bars. Use non-skid mats or decals in the tub or shower. If you need to sit down in the shower, use a plastic, non-slip stool. Keep the floor dry. Clean up any water that spills on the floor as soon as it happens. Remove soap buildup in the tub or shower regularly. Attach bath mats securely with double-sided non-slip rug tape. Do not have throw rugs and other things on the floor that can make you trip. What can I do in the  bedroom? Use night lights. Make sure that you have a light by your bed that is easy to reach. Do not use any sheets or blankets that are too big for your bed. They should not hang down onto the floor. Have a firm chair that has side arms. You can use this for support while you get dressed. Do not have throw rugs and other things on the floor that can make you trip. What can I do in the kitchen? Clean up any spills right away. Avoid walking on wet floors. Keep items that you use a lot in easy-to-reach places. If you need to reach something above you, use a strong step stool that has a grab bar. Keep electrical cords out of the way. Do not use floor polish or wax that makes floors slippery. If you must use wax, use non-skid floor wax. Do not have throw rugs and other things on the floor that can make you trip. What can I do with my stairs? Do not leave any items on the stairs. Make sure that there are handrails on both sides of the stairs and use them. Fix handrails that are broken or loose. Make sure that handrails are as long as the stairways. Check any carpeting to make sure that it is firmly attached to the stairs. Fix any carpet that is loose or worn. Avoid having throw  rugs at the top or bottom of the stairs. If you do have throw rugs, attach them to the floor with carpet tape. Make sure that you have a light switch at the top of the stairs and the bottom of the stairs. If you do not have them, ask someone to add them for you. What else can I do to help prevent falls? Wear shoes that: Do not have high heels. Have rubber bottoms. Are comfortable and fit you well. Are closed at the toe. Do not wear sandals. If you use a stepladder: Make sure that it is fully opened. Do not climb a closed stepladder. Make sure that both sides of the stepladder are locked into place. Ask someone to hold it for you, if possible. Clearly mark and make sure that you can see: Any grab bars or  handrails. First and last steps. Where the edge of each step is. Use tools that help you move around (mobility aids) if they are needed. These include: Canes. Walkers. Scooters. Crutches. Turn on the lights when you go into a dark area. Replace any light bulbs as soon as they burn out. Set up your furniture so you have a clear path. Avoid moving your furniture around. If any of your floors are uneven, fix them. If there are any pets around you, be aware of where they are. Review your medicines with your doctor. Some medicines can make you feel dizzy. This can increase your chance of falling. Ask your doctor what other things that you can do to help prevent falls. This information is not intended to replace advice given to you by your health care provider. Make sure you discuss any questions you have with your health care provider. Document Released: 03/02/2009 Document Revised: 10/12/2015 Document Reviewed: 06/10/2014 Elsevier Interactive Patient Education  2017 [employer].

## 2021-10-22 LAB — PULMONARY FUNCTION TEST
DL/VA % pred: 112 %
DL/VA: 4.4 ml/min/mmHg/L
DLCO cor % pred: 104 %
DLCO cor: 25.73 ml/min/mmHg
DLCO unc % pred: 104 %
DLCO unc: 25.73 ml/min/mmHg
FEF 25-75 Post: 1.98 L/sec
FEF 25-75 Pre: 1.77 L/sec
FEF2575-%Change-Post: 12 %
FEF2575-%Pred-Post: 95 %
FEF2575-%Pred-Pre: 85 %
FEV1-%Change-Post: 3 %
FEV1-%Pred-Post: 92 %
FEV1-%Pred-Pre: 89 %
FEV1-Post: 2.72 L
FEV1-Pre: 2.64 L
FEV1FVC-%Change-Post: 1 %
FEV1FVC-%Pred-Pre: 99 %
FEV6-%Change-Post: 3 %
FEV6-%Pred-Post: 96 %
FEV6-%Pred-Pre: 93 %
FEV6-Post: 3.7 L
FEV6-Pre: 3.58 L
FEV6FVC-%Change-Post: 1 %
FEV6FVC-%Pred-Post: 105 %
FEV6FVC-%Pred-Pre: 104 %
FVC-%Change-Post: 1 %
FVC-%Pred-Post: 91 %
FVC-%Pred-Pre: 89 %
FVC-Post: 3.75 L
FVC-Pre: 3.68 L
Post FEV1/FVC ratio: 73 %
Post FEV6/FVC ratio: 99 %
Pre FEV1/FVC ratio: 72 %
Pre FEV6/FVC Ratio: 97 %
RV % pred: 115 %
RV: 3.03 L
TLC % pred: 94 %
TLC: 6.65 L

## 2021-10-31 ENCOUNTER — Ambulatory Visit (INDEPENDENT_AMBULATORY_CARE_PROVIDER_SITE_OTHER): Payer: Medicare Other | Admitting: Family Medicine

## 2021-10-31 ENCOUNTER — Telehealth: Payer: Self-pay | Admitting: Family Medicine

## 2021-10-31 ENCOUNTER — Encounter: Payer: Self-pay | Admitting: Family Medicine

## 2021-10-31 VITALS — BP 120/61 | HR 102 | Ht 72.0 in | Wt 257.0 lb

## 2021-10-31 DIAGNOSIS — R42 Dizziness and giddiness: Secondary | ICD-10-CM

## 2021-10-31 NOTE — Patient Instructions (Signed)
Glad you are feeling mostly better! Checking some blood work today to ensure nothing is off.  Vitals look good and no signs of orthostatic hypotension. Make sure you are eating a healthy/balanced diet and staying well hydrated.   Please contact office for follow-up if symptoms do not improve or worsen. Seek emergency care if symptoms become severe.

## 2021-10-31 NOTE — Telephone Encounter (Signed)
Pt called stating he was experiencing dizzy spells for th last few days that didn't have any common trigger. Pt stated that he has not passed out or had any headaches. Pt was transferred to triage nurse for further eval.

## 2021-10-31 NOTE — Telephone Encounter (Signed)
Nurse Assessment Nurse: Thad Ranger RN, Langley Gauss Date/Time (Eastern Time): 10/31/2021 9:30:07 AM Confirm and document reason for call. If symptomatic, describe symptoms. ---Caller states he has been getting dizzy off and on since Sunday. Does the patient have any new or worsening symptoms? ---Yes Will a triage be completed? ---Yes Related visit to physician within the last 2 weeks? ---No Does the PT have any chronic conditions? (i.e. diabetes, asthma, this includes High risk factors for pregnancy, etc.) ---Yes List chronic conditions. ---UC w/immunosuppressive meds. Is this a behavioral health or substance abuse call? ---No Guidelines Guideline Title Affirmed Question Affirmed Notes Nurse Date/Time (Eastern Time) Dizziness - Lightheadedness [1] MODERATE dizziness (e.g., interferes with normal activities) AND [2] has NOT been evaluated by doctor (or NP/PA) for this (Exception: Dizziness caused by heat exposure, sudden standing, or poor fluid intake.) Carmon, RN, Langley Gauss 10/31/2021 9:31:13 AM PLEASE NOTE: All timestamps contained within this report are represented as [place] Standard Time. CONFIDENTIALTY NOTICE: This fax transmission is intended only for the addressee. It contains information that is legally privileged, confidential or otherwise protected from use or disclosure. If you are not the intended recipient, you are strictly prohibited from reviewing, disclosing, copying using or disseminating any of this information or taking any action in reliance on or regarding this information. If you have received this fax in error, please notify us immediately by telephone so that we can arrange for its return to [place]. Phone: [phone], Toll-Free: [phone], Fax: [phone] Page: 2 of 2 Call Id: [id number] [place]. Time Eilene Ghazi Time) Disposition Final User 10/31/2021 9:34:23 AM See PCP within 24 Hours Yes Carmon, RN, Yevette Edwards Disagree/Comply Comply Caller Understands  Yes PreDisposition Call Doctor Care Advice Given Per Guideline SEE PCP WITHIN 24 HOURS: DRINK FLUIDS: * Drink several glasses of fruit juice, other clear fluids or water. * This will improve hydration and blood glucose. LIE DOWN AND REST: * Lie down with feet elevated for 1 hour. * This will improve circulation and increase blood flow to the brain. CALL BACK IF: * Passes out (faints) * You become worse CARE ADVICE given per Dizziness (Adult) guideline. Referrals REFERRED TO PCP OFFICE

## 2021-10-31 NOTE — Progress Notes (Signed)
   Acute Office Visit  Subjective:     Patient ID: Bill Fox, male    DOB: 06/11/1940, 81 y.o.   MRN: [id number]  CC: dizziness    HPI Patient is in today for dizziness.  Patient states that 3 days ago, on Sunday, he started to feel little swimmy headed/dizzy but he did get some improvement when he rested and drink some water.  States that on Monday/Tuesday he continued to have some intermittent dizziness he would occasionally feel like his head was spinning.  He could not find any specific triggers or correlation to activity.  He did get some improvement with sitting still working at his computer.  He denies any recent URI/allergy symptoms.  States does not feel like vertigo which he has had several years ago.  He denies any recent changes to diet or hydration.  He denied any nausea, vision changes, syncope, chest pain, dyspnea.  Reports that today he feels back to normal.   ROS All review of systems negative except what is listed in the HPI      Objective:    BP 120/61   Pulse (!) 102   Ht 6' (1.829 m)   Wt 257 lb (116.6 kg)   BMI 34.86 kg/m    Modified orthostatics: Sitting = 114/68 HR 100; standing = 105/65 HR 103  Physical Exam Vitals reviewed.  Constitutional:      General: He is not in acute distress.    Appearance: Normal appearance. He is not ill-appearing.  HENT:     Head: Normocephalic and atraumatic.     Right Ear: Tympanic membrane normal.     Left Ear: Tympanic membrane normal.  Cardiovascular:     Rate and Rhythm: Normal rate and regular rhythm.  Pulmonary:     Effort: Pulmonary effort is normal.     Breath sounds: Normal breath sounds.  Musculoskeletal:     Cervical back: Normal range of motion and neck supple.  Skin:    General: Skin is warm and dry.  Neurological:     General: No focal deficit present.     Mental Status: He is alert and oriented to person, place, and time. Mental status is at baseline.  Psychiatric:        Mood and  Affect: Mood normal.        Behavior: Behavior normal.        Thought Content: Thought content normal.        Judgment: Judgment normal.     No results found for any visits on 10/31/21.      Assessment & Plan:   1. Dizziness  Glad you are feeling mostly better! Checking some blood work today to ensure nothing is off.  Vitals look good and no signs of orthostatic hypotension. Make sure you are eating a healthy/balanced diet and staying well hydrated.   Please contact office for follow-up if symptoms do not improve or worsen. Seek emergency care if symptoms become severe.  - CBC - Comprehensive metabolic panel   Return if symptoms worsen or fail to improve.  Terrilyn Saver, NP

## 2021-10-31 NOTE — Telephone Encounter (Signed)
Appt scheduled

## 2021-10-31 NOTE — Progress Notes (Signed)
Started Sunday Getting better today

## 2021-11-01 ENCOUNTER — Encounter: Payer: Self-pay | Admitting: Family Medicine

## 2021-11-01 ENCOUNTER — Telehealth: Payer: Self-pay

## 2021-11-01 LAB — COMPREHENSIVE METABOLIC PANEL
ALT: 9 U/L (ref 0–53)
AST: 14 U/L (ref 0–37)
Albumin: 4.1 g/dL (ref 3.5–5.2)
Alkaline Phosphatase: 59 U/L (ref 39–117)
BUN: 13 mg/dL (ref 6–23)
CO2: 23 mEq/L (ref 19–32)
Calcium: 9.2 mg/dL (ref 8.4–10.5)
Chloride: 103 mEq/L (ref 96–112)
Creatinine, Ser: 1.35 mg/dL (ref 0.40–1.50)
GFR: 50.25 mL/min — ABNORMAL LOW (ref >60.00)
Glucose, Bld: 123 mg/dL — ABNORMAL HIGH (ref 70–99)
Potassium: 4 mEq/L (ref 3.5–5.1)
Sodium: 138 mEq/L (ref 135–145)
Total Bilirubin: 0.4 mg/dL (ref 0.2–1.2)
Total Protein: 7.7 g/dL (ref 6.0–8.3)

## 2021-11-01 LAB — CBC
HCT: 43.6 % (ref 39.0–52.0)
Hemoglobin: 14.3 g/dL (ref 13.0–17.0)
MCHC: 32.9 g/dL (ref 30.0–36.0)
MCV: 83.2 fl (ref 78.0–100.0)
Platelets: 219 10*3/uL (ref 150.0–400.0)
RBC: 5.24 Mil/uL (ref 4.22–5.81)
RDW: 15.1 % (ref 11.5–15.5)
WBC: 7.5 10*3/uL (ref 4.0–10.5)

## 2021-11-01 NOTE — Telephone Encounter (Signed)
Pt called in with update- he is no longer having dizziness today but did pull off 2 ticks earlier this afternoon, he is unsure how long the ticks have been there and wonders if this could have been causing his sx's.

## 2021-11-02 ENCOUNTER — Encounter: Payer: Self-pay | Admitting: *Deleted

## 2021-11-02 NOTE — Telephone Encounter (Signed)
LMOM informing per Bill Fox that she thinks tick bite is probably not related. To look for worsening symptoms, rash around tick bite site.

## 2021-11-06 DIAGNOSIS — M79621 Pain in right upper arm: Secondary | ICD-10-CM | POA: Diagnosis not present

## 2021-11-06 DIAGNOSIS — M25511 Pain in right shoulder: Secondary | ICD-10-CM | POA: Diagnosis not present

## 2021-11-26 ENCOUNTER — Telehealth: Payer: Self-pay | Admitting: Internal Medicine

## 2021-11-27 ENCOUNTER — Telehealth: Payer: Self-pay | Admitting: Gastroenterology

## 2021-11-27 MED ORDER — MESALAMINE ER 0.375 G PO CP24: 1500.0000 mg | ORAL_CAPSULE | Freq: Every day | ORAL | 6 refills | Status: DC

## 2021-11-27 NOTE — Telephone Encounter (Signed)
Patient called stating [facility] has tried to reach [place] several times because patient is almost out of his mesalamine and needs refills.  [facility] needs you to call them at [phone] for the approval/authorization.  Please call patient and advise once this has been taken care of.  Thank you.

## 2021-11-27 NOTE — Telephone Encounter (Signed)
Sent refills of Apriso per patient's request

## 2021-11-28 NOTE — Telephone Encounter (Signed)
I sent in a new rx but I didn't call them - I thought it was just an issue of them not having refills

## 2021-12-25 DIAGNOSIS — J0100 Acute maxillary sinusitis, unspecified: Secondary | ICD-10-CM | POA: Diagnosis not present

## 2021-12-25 DIAGNOSIS — J029 Acute pharyngitis, unspecified: Secondary | ICD-10-CM | POA: Diagnosis not present

## 2022-01-11 ENCOUNTER — Telehealth: Payer: Self-pay | Admitting: Internal Medicine

## 2022-01-11 ENCOUNTER — Other Ambulatory Visit: Payer: Self-pay | Admitting: Family Medicine

## 2022-01-11 ENCOUNTER — Other Ambulatory Visit: Payer: Self-pay

## 2022-01-11 DIAGNOSIS — R197 Diarrhea, unspecified: Secondary | ICD-10-CM

## 2022-01-11 MED ORDER — TERBINAFINE HCL 250 MG PO TABS: 250.0000 mg | ORAL_TABLET | Freq: Every day | ORAL | 2 refills | Status: DC

## 2022-01-11 MED ORDER — PREDNISONE 10 MG PO TABS: 40.0000 mg | ORAL_TABLET | Freq: Every day | ORAL | 0 refills | Status: DC

## 2022-01-11 MED ORDER — AMBULATORY NON FORMULARY MEDICATION: 1 refills | Status: DC

## 2022-01-11 NOTE — Telephone Encounter (Signed)
1.  Check stool for C. difficile 2.  Start prednisone 40 mg daily for probable colitis flare 3.  Prescribe diltiazem 2% ointment for probable recurrent anal fissure.  Apply circumferentially to anal sphincter 5 times daily 4.  Office follow-up in 2 weeks

## 2022-01-11 NOTE — Telephone Encounter (Signed)
Patient states he is having to use the bathroom every 10 min, he is having a burning sensation when using the bathroom, states he is in pain. Would like a call back, thank you.

## 2022-01-11 NOTE — Telephone Encounter (Signed)
Pt scheduled to see Dr. Henrene Pastor 01/25/22@3 :40pm, script sent in for prednisone to [facility], Script sent to [facility] for diltiazem, order in epic for lab. Pt aware.

## 2022-01-11 NOTE — Telephone Encounter (Signed)
Sounds good. Thanks 

## 2022-01-11 NOTE — Telephone Encounter (Signed)
Pt calling stating for about a week he has been having multiple looser stools. Reports he is having to go to the bathroom about every 10-33mn and now his bottom is burning and painful. Pt states he is taking Apriso 1.5gm daily. States he has not been on any antibiotics recently.He wants to  know what Dr. PHenrene Pastorrecommends, please advise.\

## 2022-01-11 NOTE — Telephone Encounter (Signed)
Do you want me to put him in your 3:40pm slot on 9/8?

## 2022-01-14 ENCOUNTER — Other Ambulatory Visit: Payer: Medicare Other

## 2022-01-25 ENCOUNTER — Ambulatory Visit (INDEPENDENT_AMBULATORY_CARE_PROVIDER_SITE_OTHER): Payer: Medicare Other | Admitting: Internal Medicine

## 2022-01-25 ENCOUNTER — Encounter: Payer: Self-pay | Admitting: Internal Medicine

## 2022-01-25 VITALS — BP 130/74 | HR 96 | Ht 72.0 in | Wt 252.1 lb

## 2022-01-25 DIAGNOSIS — R197 Diarrhea, unspecified: Secondary | ICD-10-CM

## 2022-01-25 DIAGNOSIS — K51919 Ulcerative colitis, unspecified with unspecified complications: Secondary | ICD-10-CM

## 2022-01-25 DIAGNOSIS — K625 Hemorrhage of anus and rectum: Secondary | ICD-10-CM | POA: Diagnosis not present

## 2022-01-25 NOTE — Patient Instructions (Signed)
Dr Henrene Pastor has advised that you be on a prednisone taper. The taper instructions are as follows:  Prednisone 30 mg daily x 7 days Prednisone 20 mg daily x 7 days Prednisone 10 mg daily x 7 days Then, discontinue.  Please follow up with Dr Henrene Pastor in 6 months.  _______________________________________________________  If you are age 79 or older, your body mass index should be between 23-30. Your Body mass index is 34.19 kg/m. If this is out of the aforementioned range listed, please consider follow up with your Primary Care Provider.  If you are age 62 or younger, your body mass index should be between 19-25. Your Body mass index is 34.19 kg/m. If this is out of the aformentioned range listed, please consider follow up with your Primary Care Provider.   ________________________________________________________  The [place] [facility] providers would like to encourage you to use [facility] to communicate with providers for non-urgent requests or questions.  Due to long hold times on the telephone, sending your provider a message by [facility] may be a faster and more efficient way to get a response.  Please allow 48 business hours for a response.  Please remember that this is for non-urgent requests.  _______________________________________________________  Due to recent changes in healthcare laws, you may see the results of your imaging and laboratory studies on MyChart before your provider has had a chance to review them.  We understand that in some cases there may be results that are confusing or concerning to you. Not all laboratory results come back in the same time frame and the provider may be waiting for multiple results in order to interpret others.  Please give [place] 48 hours in order for your provider to thoroughly review all the results before contacting the office for clarification of your results.

## 2022-01-25 NOTE — Progress Notes (Signed)
HISTORY OF PRESENT ILLNESS:  Bill Fox is a 81 y.o. male with chronic left-sided ulcerative colitis.  Last seen in the office November 2022.  He has been on chronic mesalamine therapy.  He was doing well until January 11, 2022 when he contacted the office reporting multiple loose stools and rectal burning.  He does have a history of anal fissure.  Stool testing for C. difficile was negative.  He was placed on prednisone 40 mg daily.  Also prescribed 2% diltiazem ointment.  He follows up at this time.  He is pleased to report that his loose stools have improved.  He had a nice formed bowel movement this morning.  He also reports that the diltiazem ointment has resolved his issues with rectal discomfort with defecation.  No other complaints.  He has gained significant weight since starting prednisone and hopes to have a rapid taper.  Colonoscopy January 2022  REVIEW OF SYSTEMS:  All non-GI ROS negative unless otherwise stated in the HPI. Past Medical History:  Diagnosis Date   Anxiety    At risk for sleep apnea    STOP-BANG= 5     SENT TO PCP 11-29-2013   Cataract    Chronic fatigue    DJD (degenerative joint disease)    RIGHT KNEE   History of colon polyps    2011  &  2013 --  ADENOMATOUS   History of pulmonary embolism    Hyperlipidemia    pt denies ever having high chol   Hypertension    Right knee meniscal tear    Ulcerative colitis     Past Surgical History:  Procedure Laterality Date   CATARACT EXTRACTION W/ INTRAOCULAR LENS IMPLANT Left 2014   COLONOSCOPY W/ POLYPECTOMY  MULTIPLE--  LAST ONE 09-12-2011   KNEE ARTHROSCOPY WITH DRILLING/MICROFRACTURE Right 12/03/2013   Procedure: RIGHT KNEE ARTHROSCOPY PARTIAL MEDIAL MENISCECTOMY DEBRIDEMENT SYNOVECTOMY;  Surgeon: Johnn Hai, MD;  Location: [facility];  Service: Orthopedics;  Laterality: Right;   NASAL FRACTURE SURGERY  1985   TRANSTHORACIC ECHOCARDIOGRAM  11-01-2008   MILD LVH/  EF 55%/  GRADE I  DIASTOLIC DYSFUNCTION/  MILD LAE    Social History FABIO WAH  reports that he quit smoking about 28 years ago. His smoking use included cigarettes. He has a 30.00 pack-year smoking history. He has never used smokeless tobacco. He reports current alcohol use of about 1.0 standard drink of alcohol per week. He reports that he does not use drugs.  family history includes Diabetes in his mother and paternal uncle; Lung cancer in his brother; Stomach cancer in his father.  No Known Allergies     PHYSICAL EXAMINATION: Vital signs: BP 130/74   Pulse 96   Ht 6' (1.829 m)   Wt 252 lb 2 oz (114.4 kg)   BMI 34.19 kg/m   Constitutional: generally well-appearing, no acute distress Psychiatric: alert and oriented x3, cooperative Eyes: extraocular movements intact, anicteric, conjunctiva pink Mouth: oral pharynx moist, no lesions Neck: supple no lymphadenopathy Cardiovascular: heart regular rate and rhythm, no murmur Lungs: clear to auscultation bilaterally Abdomen: soft, obese, nontender, nondistended, no obvious ascites, no peritoneal signs, normal bowel sounds, no organomegaly Rectal: Omitted Extremities: no clubbing, cyanosis, or lower extremity edema bilaterally Skin: no lesions on visible extremities save onychomycoses of the toenails Neuro: No focal deficits.  Nerves intact  ASSESSMENT:  1.  Chronic left-sided ulcerative colitis.  Recent flare.  Proved on prednisone 2.  Recent problems with rectal burning  likely recurrent fissure.  Improved on diltiazem 3.  Chronic medical problems.  Stable 4.  Last colonoscopy January 2022  PLAN:  1.  Decrease prednisone to 30 mg daily for 1 week, then 20 mg daily for 1 week, then 10 mg daily for 1 week, then stop 2.  Continue mesalamine 3.  Diltiazem ointment as needed 4.  Routine GI office follow-up 6 months.  Contact the office in the interim for questions or problems.  He agrees 5.  Will be due for colonoscopy around January  2024 Total time of 30 minutes was spent preparing to see the patient, obtaining comprehensive history, performing medically appropriate physical examination, reviewing records, ordering stool studies and medication, counseling and educating the patient regarding the above listed issues, directing medical therapy, recommending follow-up, and documenting clinical information in the health record

## 2022-02-04 ENCOUNTER — Other Ambulatory Visit: Payer: Self-pay | Admitting: Family Medicine

## 2022-02-06 ENCOUNTER — Telehealth: Payer: Self-pay | Admitting: Family Medicine

## 2022-02-06 NOTE — Telephone Encounter (Signed)
Patient wants to speak to Dr. Irene Limbo nurse regarding results he received from his GI provider. Please call patient back to discuss.

## 2022-02-08 ENCOUNTER — Telehealth: Payer: Self-pay | Admitting: Family Medicine

## 2022-02-08 NOTE — Telephone Encounter (Signed)
Pt called asking if he need to follow up with Dr. Nani Ravens after he had his GI appt. Pt would like a call back when possible with this info.

## 2022-02-11 NOTE — Telephone Encounter (Signed)
Called the patient informed of PCP response. He needs more clarification and will call GI

## 2022-03-05 ENCOUNTER — Other Ambulatory Visit: Payer: Self-pay

## 2022-03-05 MED ORDER — MESALAMINE ER 0.375 G PO CP24: 1500.0000 mg | ORAL_CAPSULE | Freq: Every day | ORAL | 3 refills | Status: DC

## 2022-03-05 MED ORDER — MESALAMINE ER 0.375 G PO CP24: 1500.0000 mg | ORAL_CAPSULE | Freq: Every day | ORAL | 6 refills | Status: DC

## 2022-03-11 ENCOUNTER — Telehealth: Payer: Self-pay | Admitting: Internal Medicine

## 2022-03-11 NOTE — Telephone Encounter (Signed)
Patient called requesting to speak with someone by today regarding the medication changes.

## 2022-03-12 NOTE — Telephone Encounter (Signed)
Patient was curious as to why I had refilled his Apriso when he was under the impression he was taking Lialda.  After looking though his chart, I clarified that Bill Fox is what he has been taking.  He also questioned why he only got a 30 day supply instead of a 90 day supply.  I called [facility] who told me it was too soon to refill the whole prescription but that the rest of the supply would process after the 25th and be shipped out.  Turn around time to process is 7-10 days so he should receive it before he runs out of his current amount

## 2022-03-17 ENCOUNTER — Emergency Department (HOSPITAL_COMMUNITY): Payer: Medicare Other

## 2022-03-17 ENCOUNTER — Encounter (HOSPITAL_COMMUNITY): Payer: Self-pay

## 2022-03-17 ENCOUNTER — Emergency Department (HOSPITAL_COMMUNITY)
Admission: EM | Admit: 2022-03-17 | Discharge: 2022-03-17 | Disposition: A | Discharge status: Home / Self Care | Payer: Medicare Other | Attending: Emergency Medicine | Admitting: Emergency Medicine

## 2022-03-17 ENCOUNTER — Other Ambulatory Visit: Payer: Self-pay

## 2022-03-17 DIAGNOSIS — Z7901 Long term (current) use of anticoagulants: Secondary | ICD-10-CM | POA: Diagnosis not present

## 2022-03-17 DIAGNOSIS — Z79899 Other long term (current) drug therapy: Secondary | ICD-10-CM | POA: Insufficient documentation

## 2022-03-17 DIAGNOSIS — R0602 Shortness of breath: Secondary | ICD-10-CM | POA: Insufficient documentation

## 2022-03-17 DIAGNOSIS — J9811 Atelectasis: Secondary | ICD-10-CM | POA: Diagnosis not present

## 2022-03-17 DIAGNOSIS — R0600 Dyspnea, unspecified: Secondary | ICD-10-CM | POA: Diagnosis not present

## 2022-03-17 DIAGNOSIS — R0789 Other chest pain: Secondary | ICD-10-CM | POA: Diagnosis not present

## 2022-03-17 DIAGNOSIS — R079 Chest pain, unspecified: Secondary | ICD-10-CM

## 2022-03-17 DIAGNOSIS — I10 Essential (primary) hypertension: Secondary | ICD-10-CM | POA: Diagnosis not present

## 2022-03-17 DIAGNOSIS — Z1152 Encounter for screening for COVID-19: Secondary | ICD-10-CM | POA: Insufficient documentation

## 2022-03-17 LAB — CBC
HCT: 43.6 % (ref 39.0–52.0)
Hemoglobin: 14 g/dL (ref 13.0–17.0)
MCH: 28.2 pg (ref 26.0–34.0)
MCHC: 32.1 g/dL (ref 30.0–36.0)
MCV: 87.7 fL (ref 80.0–100.0)
Platelets: 208 10*3/uL (ref 150–400)
RBC: 4.97 MIL/uL (ref 4.22–5.81)
RDW: 15 % (ref 11.5–15.5)
WBC: 8.1 10*3/uL (ref 4.0–10.5)
nRBC: 0 % (ref 0.0–0.2)

## 2022-03-17 LAB — BRAIN NATRIURETIC PEPTIDE: B Natriuretic Peptide: 25.6 pg/mL (ref 0.0–100.0)

## 2022-03-17 LAB — RESP PANEL BY RT-PCR (FLU A&B, COVID) ARPGX2
Influenza A by PCR: NEGATIVE
Influenza B by PCR: NEGATIVE
SARS Coronavirus 2 by RT PCR: NEGATIVE

## 2022-03-17 LAB — COMPREHENSIVE METABOLIC PANEL
ALT: 13 U/L (ref 0–44)
AST: 17 U/L (ref 15–41)
Albumin: 3.7 g/dL (ref 3.5–5.0)
Alkaline Phosphatase: 63 U/L (ref 38–126)
Anion gap: 9 (ref 5–15)
BUN: 15 mg/dL (ref 8–23)
CO2: 23 mmol/L (ref 22–32)
Calcium: 8.8 mg/dL — ABNORMAL LOW (ref 8.9–10.3)
Chloride: 107 mmol/L (ref 98–111)
Creatinine, Ser: 1.26 mg/dL — ABNORMAL HIGH (ref 0.61–1.24)
GFR, Estimated: 58 mL/min — ABNORMAL LOW (ref >60)
Glucose, Bld: 98 mg/dL (ref 70–99)
Potassium: 3.8 mmol/L (ref 3.5–5.1)
Sodium: 139 mmol/L (ref 135–145)
Total Bilirubin: 0.4 mg/dL (ref 0.3–1.2)
Total Protein: 7.5 g/dL (ref 6.5–8.1)

## 2022-03-17 LAB — TROPONIN I (HIGH SENSITIVITY)
Troponin I (High Sensitivity): 3 ng/L (ref <18)
Troponin I (High Sensitivity): 3 ng/L (ref <18)

## 2022-03-17 LAB — PROTIME-INR
INR: 1.5 — ABNORMAL HIGH (ref 0.8–1.2)
Prothrombin Time: 17.5 seconds — ABNORMAL HIGH (ref 11.4–15.2)

## 2022-03-17 MED ORDER — IOHEXOL 350 MG/ML SOLN
75.0000 mL | Freq: Once | INTRAVENOUS | Status: AC | PRN
  Administered 2022-03-17: 75 mL via INTRAVENOUS

## 2022-03-17 NOTE — Discharge Instructions (Addendum)
Your CT scan was negative for blood clot or other acute abnormality. It did show coronary calcifications and follow-up outpatient with cardiology is warranted. Your PCP may be able to schedule a cardiac stress test sooner. Your laboratory testing was reassuring.  CT PE Study:  FINDINGS:  Cardiovascular: Satisfactory opacification of the pulmonary arteries  to the segmental level. No evidence of pulmonary embolism. The  previously seen filling defects are no longer identified. Vascular  calcifications are seen in the coronary arteries and aortic arch.  Normal heart size. No pericardial effusion.    Mediastinum/Nodes: No enlarged mediastinal, hilar, or axillary lymph  nodes. Thyroid gland, trachea, and esophagus demonstrate no  significant findings.    Lungs/Pleura: There is mild-to-moderate bilateral atelectasis. No  pleural effusion or pneumothorax.    Upper Abdomen: Cholelithiasis without evidence of acute  cholecystitis.    Musculoskeletal: Degenerative changes are seen in the spine. Chronic  left-sided rib fractures are noted.    Review of the MIP images confirms the above findings.    IMPRESSION:  1. No evidence of pulmonary embolism.  2. Mild-to-moderate bilateral atelectasis.    Aortic Atherosclerosis (ICD10-I70.0).

## 2022-03-17 NOTE — ED Triage Notes (Addendum)
Patient c/o intermittent SOB and chest pain under the left breast area x 2 hours ago. Patient went to an UC  and was told to come to the ED.

## 2022-03-17 NOTE — ED Provider Notes (Signed)
[facility] DEPT Provider Note   CSN: [id number] Arrival date & time: 03/17/22  1801     History {Add pertinent medical, surgical, social history, OB history to HPI:1} Chief Complaint  Patient presents with   Shortness of Breath   Chest Pain    Bill Fox is a 81 y.o. male.   Shortness of Breath Associated symptoms: chest pain   Chest Pain Associated symptoms: shortness of breath        Home Medications Prior to Admission medications   Medication Sig Start Date End Date Taking? Authorizing Provider  AMBULATORY NON FORMULARY MEDICATION Medication Name: Diltiazem 2% ointment Apply around anus 5 times daily 01/11/22   Irene Shipper, MD  doxazosin (CARDURA) 8 MG tablet TAKE ONE-HALF (1/2) TABLET AT BEDTIME 02/04/22   Wendling, Crosby Oyster, DO  ELIQUIS 5 MG TABS tablet TAKE 2 TABLETS TWICE A DAY FOR 5 DAYS, THEN 1 TABLET TWICE A DAY THEREAFTER 03/07/21   Wendling, Crosby Oyster, DO  mesalamine (APRISO) 0.375 g 24 hr capsule Take 4 capsules (1.5 g total) by mouth daily. 03/05/22   Irene Shipper, MD  mirtazapine (REMERON) 30 MG tablet Take 1 tablet (30 mg total) by mouth at bedtime. 06/05/21   [name], DO  predniSONE (DELTASONE) 10 MG tablet Take 4 tablets (40 mg total) by mouth daily with breakfast. 01/11/22   Irene Shipper, MD  terbinafine (LAMISIL) 250 MG tablet Take 1 tablet (250 mg total) by mouth daily. 01/11/22   [name], DO      Allergies    Patient has no known allergies.    Review of Systems   Review of Systems  Respiratory:  Positive for shortness of breath.   Cardiovascular:  Positive for chest pain.    Physical Exam Updated Vital Signs BP (!) 165/94   Pulse 79   Temp 98.4 F (36.9 C) (Oral)   Resp 14   Ht 6' (1.829 m)   Wt 115.7 kg   SpO2 98%   BMI 34.58 kg/m  Physical Exam  ED Results / Procedures / Treatments   Labs (all labs ordered are listed, but only abnormal results are  displayed) Labs Reviewed  COMPREHENSIVE METABOLIC PANEL - Abnormal; Notable for the following components:      Result Value   Creatinine, Ser 1.26 (*)    Calcium 8.8 (*)    GFR, Estimated 58 (*)    All other components within normal limits  PROTIME-INR - Abnormal; Notable for the following components:   Prothrombin Time 17.5 (*)    INR 1.5 (*)    All other components within normal limits  CBC  TROPONIN I (HIGH SENSITIVITY)  TROPONIN I (HIGH SENSITIVITY)    EKG None  Radiology DG Chest 2 View  Result Date: 03/17/2022 CLINICAL DATA:  Episode of sharp left-sided chest pain EXAM: CHEST - 2 VIEW COMPARISON:  01/19/2021 FINDINGS: The heart size and mediastinal contours are within normal limits. Both lungs are clear. Disc degenerative disease of the thoracic spine. IMPRESSION: No acute abnormality of the lungs. Electronically Signed   By: Delanna Ahmadi M.D.   On: 03/17/2022 18:43    Procedures Procedures  {Document cardiac monitor, telemetry assessment procedure when appropriate:1}  Medications Ordered in ED Medications - No data to display  ED Course/ Medical Decision Making/ A&P  Medical Decision Making  ***  {Document critical care time when appropriate:1} {Document review of labs and clinical decision tools ie heart score, Chads2Vasc2 etc:1}  {Document your independent review of radiology images, and any outside records:1} {Document your discussion with family members, caretakers, and with consultants:1} {Document social determinants of health affecting pt's care:1} {Document your decision making why or why not admission, treatments were needed:1} Final Clinical Impression(s) / ED Diagnoses Final diagnoses:  None    Rx / DC Orders ED Discharge Orders     None

## 2022-03-17 NOTE — ED Provider Triage Note (Signed)
Emergency Medicine Provider Triage Evaluation Note  Bill Fox , a 81 y.o. male  was evaluated in triage.  Pt complains of chest pain. Had onset of sharp pain under L breast while typing on the computer today which resolved after ~20 minutes. Had recurrence of another episode shortly after. Currently chest pain free. Went to [facility] who advised ED evaluation. Had some SOB when chest pain present. Pain not exertional, per patient. No fever, cough, leg swelling, jaw pain, diaphoresis.  Hx of saddle PE in 11/2020. Chronically anticoagulated on Eliquis; denies missing any doses.  Review of Systems  Positive: As above Negative: As above  Physical Exam  There were no vitals taken for this visit. Gen:   Awake, no distress   Resp:  Normal effort. Lungs CTAB. MSK:   Moves extremities without difficulty  Other:  No BLE edema.  Medical Decision Making  Medically screening exam initiated at 6:07 PM.  Appropriate orders placed.  Bill Fox was informed that the remainder of the evaluation will be completed by another provider, this initial triage assessment does not replace that evaluation, and the importance of remaining in the ED until their evaluation is complete.  Undifferentiated CP - work up initiated.   Antonietta Breach, PA-C 03/17/22 1821

## 2022-03-20 ENCOUNTER — Ambulatory Visit: Payer: Medicare Other | Admitting: Family Medicine

## 2022-03-25 ENCOUNTER — Encounter: Payer: Self-pay | Admitting: Family Medicine

## 2022-03-25 ENCOUNTER — Ambulatory Visit (INDEPENDENT_AMBULATORY_CARE_PROVIDER_SITE_OTHER): Payer: Medicare Other | Admitting: Family Medicine

## 2022-03-25 VITALS — BP 132/80 | HR 94 | Temp 98.3°F | Ht 72.0 in | Wt 255.4 lb

## 2022-03-25 DIAGNOSIS — R0789 Other chest pain: Secondary | ICD-10-CM | POA: Diagnosis not present

## 2022-03-25 NOTE — Patient Instructions (Addendum)
Drink a glass of water with Metamucil/Benefiber around 30-45 minutes before meals to help with satiety.  Keep the diet clean and stay active.  Aim to do some physical exertion for 150 minutes per week. This is typically divided into 5 days per week, 30 minutes per day. The activity should be enough to get your heart rate up. Anything is better than nothing if you have time constraints.   OK to take Tylenol 1000 mg (2 extra strength tabs) or 975 mg (3 regular strength tabs) every 6 hours as needed.  Let us know if you need anything.

## 2022-03-25 NOTE — Progress Notes (Signed)
Chief Complaint  Patient presents with   ER Followup    Subjective: Patient is a 81 y.o. male here for ER f/u.  Was seen for CP at the ER on 03/17/22. His workup was negative. Has not had issues since then. He is breathing OK. He does not exercise routinely, but when he does walk, he does not have any issues. He has an appt w cards in 4 d. No a/w meals/positions, trauma, rashes.   Past Medical History:  Diagnosis Date   Anxiety    At risk for sleep apnea    STOP-BANG= 5     SENT TO PCP 11-29-2013   Cataract    Chronic fatigue    DJD (degenerative joint disease)    RIGHT KNEE   History of colon polyps    2011  &  2013 --  ADENOMATOUS   History of pulmonary embolism    Hyperlipidemia    pt denies ever having high chol   Hypertension    Right knee meniscal tear    Ulcerative colitis     Objective: BP 132/80 (BP Location: Left Arm, Patient Position: Sitting, Cuff Size: Normal)   Pulse 94   Temp 98.3 F (36.8 C) (Oral)   Ht 6' (1.829 m)   Wt 255 lb 6 oz (115.8 kg)   SpO2 93%   BMI 34.64 kg/m  General: Awake, appears stated age Heart: RRR, no LE edema Lungs: CTAB, no rales, wheezes or rhonchi. No accessory muscle use MSK: No ttp over chest wall Psych: Age appropriate judgment and insight, normal affect and mood  Assessment and Plan: Atypical chest pain  Hopefully n a good spot. Workup in EKG neg. Tylenol prn. Stay active. Counseled on diet.  F/u as originally scheduled.  The patient voiced understanding and agreement to the plan.  Foster Brook, DO 03/25/22  2:09 PM

## 2022-03-29 ENCOUNTER — Ambulatory Visit: Payer: Medicare Other | Admitting: Cardiovascular Disease

## 2022-04-02 ENCOUNTER — Other Ambulatory Visit: Payer: Self-pay | Admitting: Family Medicine

## 2022-04-02 ENCOUNTER — Telehealth: Payer: Self-pay | Admitting: Family Medicine

## 2022-04-02 DIAGNOSIS — L989 Disorder of the skin and subcutaneous tissue, unspecified: Secondary | ICD-10-CM

## 2022-04-02 NOTE — Telephone Encounter (Signed)
Referral done

## 2022-04-02 NOTE — Telephone Encounter (Signed)
Patient called to request referral for a dermatologist "bad places on his hip and arms." He has bumps and it itches real bad. Patient said he just saw dr. Nani Ravens so he doesn't need to see him again. Patient cannot receive phone calls right now. Advised pt he could call back tomorrow to see if a referral had been placed or what the status is.

## 2022-04-18 ENCOUNTER — Ambulatory Visit: Payer: Medicare Other | Attending: Cardiovascular Disease | Admitting: Cardiovascular Disease

## 2022-04-18 ENCOUNTER — Encounter: Payer: Self-pay | Admitting: Cardiovascular Disease

## 2022-04-18 VITALS — BP 132/74 | HR 94 | Ht 72.0 in | Wt 254.6 lb

## 2022-04-18 DIAGNOSIS — R079 Chest pain, unspecified: Secondary | ICD-10-CM

## 2022-04-18 DIAGNOSIS — E785 Hyperlipidemia, unspecified: Secondary | ICD-10-CM | POA: Insufficient documentation

## 2022-04-18 DIAGNOSIS — I2510 Atherosclerotic heart disease of native coronary artery without angina pectoris: Secondary | ICD-10-CM | POA: Insufficient documentation

## 2022-04-18 DIAGNOSIS — I700 Atherosclerosis of aorta: Secondary | ICD-10-CM | POA: Insufficient documentation

## 2022-04-18 DIAGNOSIS — I10 Essential (primary) hypertension: Secondary | ICD-10-CM | POA: Diagnosis not present

## 2022-04-18 DIAGNOSIS — R072 Precordial pain: Secondary | ICD-10-CM | POA: Diagnosis not present

## 2022-04-18 NOTE — Progress Notes (Signed)
Cardiology Office Note:    Date:  04/21/2022   ID:  Bill Fox, Bill Fox [birth year], MRN [id number]  PCP:  [name], [place] [facility] Cardiologist:  Sanda Klein, MD     Referring MD: Regan Lemming, MD   Chief Complaint  Patient presents with   Consult  Bill Fox is a 81 y.o. male who is being seen today for the evaluation of chest pain at the request of Regan Lemming, MD.   History of Present Illness:    Bill Fox is a 81 y.o. male with a hx of saddle pulmonary embolism (2022) on chronic anticoagulation with Eliquis, ulcerative colitis, essential hypertension, bilateral degenerative joint disease of the knees, recurrent after an episode of chest pain.  Roughly a month ago he had was single episode of severe sharp chest pain in his lower chest radiating from the retrosternal area towards the left flank.  It occurred at rest.  It has not recurred in an entire month.  He has been exercising on the treadmill without recurrent chest pain.  He was evaluated in the emergency room on 03/17/2022 had normal cardiac enzymes, normal electrocardiogram, unremarkable chest x-ray and a CT angiogram that excluded pulmonary embolism.  Previous imaging studies (CT angiogram 03/17/2022 performed for pulmonary embolism) show evidence of aortic atherosclerosis as well as calcification in the distribution of the proximal LAD artery, but he has never had confirm CAD or PAD.  His blood pressure is well-controlled.  She denies palpitations, dizziness, headaches or syncope.  Abdominal discomfort and frequent bowel movements are generally well-controlled with mesalamine.  He has not had focal neurological events.  Denies intermittent claudication.  Does not have orthopnea, PND or lower extremity edema and is generally able to perform moderate exercise without any limitations.  He is moderately obese with a BMI of 34.  He does not have diabetes mellitus.  Did not  have a recent lipid profile available, but in 2016-2018 his lipid profile was remarkable for very low HDL, consistently around 30.  Most recent LDL was 192.  The echocardiogram performed at the time of his pulm embolism in 2022 showed LVEF 50-55% and depressed right ventricle with McConnell sign.  A follow-up echo performed a month later in January 01, 2021 showed similar findings with improved RV function.He does not have any significant valvular abnormalities  He is a [place] veteran.  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  Past Medical History:  Diagnosis Date   Anxiety    At risk for sleep apnea    STOP-BANG= 5     SENT TO PCP 11-29-2013   Cataract    Chronic fatigue    DJD (degenerative joint disease)    RIGHT KNEE   History of colon polyps    2011  &  2013 --  ADENOMATOUS   History of pulmonary embolism    Hyperlipidemia    pt denies ever having high chol   Hypertension    Right knee meniscal tear    Ulcerative colitis     Past Surgical History:  Procedure Laterality Date   CATARACT EXTRACTION W/ INTRAOCULAR LENS IMPLANT Left 2014   COLONOSCOPY W/ POLYPECTOMY  MULTIPLE--  LAST ONE 09-12-2011   KNEE ARTHROSCOPY WITH DRILLING/MICROFRACTURE Right 12/03/2013   Procedure: RIGHT KNEE ARTHROSCOPY PARTIAL MEDIAL MENISCECTOMY DEBRIDEMENT SYNOVECTOMY;  Surgeon: Johnn Hai, MD;  Location: [place];  Service: Orthopedics;  Laterality: Right;   [place]  TRANSTHORACIC ECHOCARDIOGRAM  11-01-2008   MILD LVH/  EF 16%/  GRADE I DIASTOLIC DYSFUNCTION/  MILD LAE    Current Medications: Current Meds  Medication Sig   doxazosin (CARDURA) 8 MG tablet TAKE ONE-HALF (1/2) TABLET AT BEDTIME   ELIQUIS 5 MG TABS tablet TAKE 2 TABLETS TWICE A DAY FOR 5 DAYS, THEN 1 TABLET TWICE A DAY THEREAFTER   mesalamine (APRISO) 0.375 g 24 hr capsule Take 4 capsules (1.5 g total) by mouth daily.   mirtazapine (REMERON) 30 MG tablet Take 1 tablet (30 mg total) by mouth at  bedtime.   terbinafine (LAMISIL) 250 MG tablet Take 1 tablet (250 mg total) by mouth daily.     Allergies:   Patient has no known allergies.   Social History   Socioeconomic History   Marital status: Married    Spouse name: Not on file   Number of children: Not on file   Years of education: Not on file   Highest education level: Not on file  Occupational History   Occupation: retired  Tobacco Use   Smoking status: Former    Packs/day: 1.00    Years: 30.00    Total pack years: 30.00    Types: Cigarettes    Quit date: 05/20/1993    Years since quitting: 28.9   Smokeless tobacco: Never  Vaping Use   Vaping Use: Never used  Substance and Sexual Activity   Alcohol use: Yes    Alcohol/week: 1.0 standard drink of alcohol    Types: 1 Cans of beer per week    Comment: beer on occ   Drug use: No   Sexual activity: Yes  Other Topics Concern   Not on file  Social History Narrative   Not on file   Social Determinants of Health   Financial Resource Strain: Low Risk  (10/18/2021)   Overall Financial Resource Strain (CARDIA)    Difficulty of Paying Living Expenses: Not hard at all  Food Insecurity: No Food Insecurity (10/18/2021)   Hunger Vital Sign    Worried About Running Out of Food in the Last Year: Never true    Ran Out of Food in the Last Year: Never true  Transportation Needs: No Transportation Needs (10/18/2021)   PRAPARE - Hydrologist (Medical): No    Lack of Transportation (Non-Medical): No  Physical Activity: Insufficiently Active (10/18/2021)   Exercise Vital Sign    Days of Exercise per Week: 7 days    Minutes of Exercise per Session: 20 min  Stress: No Stress Concern Present (10/18/2021)   [place]    Feeling of Stress : Not at all  Social Connections: Moderately Integrated (10/18/2021)   Social Connection and Isolation Panel [NHANES]    Frequency of Communication with Friends  and Family: More than three times a week    Frequency of Social Gatherings with Friends and Family: More than three times a week    Attends Religious Services: More than 4 times per year    Active Member of [employer] or Organizations: Yes    Attends Music therapist: More than 4 times per year    Marital Status: Divorced     Family History: The patient's family history includes Diabetes in his mother and paternal uncle; Lung cancer in his brother; Stomach cancer in his father. There is no history of Colon cancer, Pancreatic cancer, Esophageal cancer, or Colon polyps.  ROS:   Please  see the history of present illness.     All other systems reviewed and are negative.  EKGs/Labs/Other Studies Reviewed:    The following studies were reviewed today: Echo 01/01/2021  1. Poor acoustic windows limit study. Consider limited with Definity.   2. Poor acoustic windows. LVEF apprears normal (50 to 55%) . The left  ventricle has normal function.   3. Very poor acoustic windows limit study RV is normal in size RVEF is  probably mildly reduced. Compared to echo from 11/21/20, RV size has  normalized and RVEF is improved. . Right ventricular systolic function is  low normal. The right ventricular size is   normal.   4. The aortic valve is grossly normal. Aortic valve regurgitation is not  visualized.   CT angiogram for pulmonary embolism 03/17/2022  IMPRESSION: 1. No evidence of pulmonary embolism. 2. Mild-to-moderate bilateral atelectasis.   Aortic Atherosclerosis  Cardiovascular: Satisfactory opacification of the pulmonary arteries to the segmental level. No evidence of pulmonary embolism. The previously seen filling defects are no longer identified. Vascular calcifications are seen in the coronary arteries and aortic arch. Normal heart size. No pericardial effusion.  (On my review, coronary calcifications are particularly prominent in the LAD artery)  EKG:  EKG is  ordered  today.  The ekg ordered today demonstrates normal sinus rhythm, left anterior fascicular block, QTc 465 ms  Recent Labs: 03/17/2022: ALT 13; B Natriuretic Peptide 25.6; BUN 15; Creatinine, Ser 1.26; Hemoglobin 14.0; Platelets 208; Potassium 3.8; Sodium 139  Recent Lipid Panel    Component Value Date/Time   CHOL 157 01/02/2017 0926   TRIG 133.0 01/02/2017 0926   HDL 29.20 (L) 01/02/2017 0926   CHOLHDL 5 01/02/2017 0926   VLDL 26.6 01/02/2017 0926   LDLCALC 102 (H) 01/02/2017 0926   LDLDIRECT 113.0 12/25/2015 0929     Risk Assessment/Calculations:       Physical Exam:    VS:  BP 132/74 (BP Location: Left Arm, Patient Position: Sitting, Cuff Size: Large)   Pulse 94   Ht 6' (1.829 m)   Wt 115.5 kg   SpO2 91%   BMI 34.53 kg/m     Wt Readings from Last 3 Encounters:  04/18/22 115.5 kg  03/25/22 115.8 kg  03/17/22 115.7 kg     GEN: Moderately obese, well nourished, well developed in no acute distress HEENT: Normal NECK: No JVD; No carotid bruits LYMPHATICS: No lymphadenopathy CARDIAC: RRR, no murmurs, rubs, gallops RESPIRATORY:  Clear to auscultation without rales, wheezing or rhonchi  ABDOMEN: Soft, non-tender, non-distended MUSCULOSKELETAL:  No edema; No deformity  SKIN: Warm and dry NEUROLOGIC:  Alert and oriented x 3 PSYCHIATRIC:  Normal affect   ASSESSMENT:    1. Precordial pain   2. Atherosclerosis of aorta ([place])   3. Coronary artery calcification seen on CT scan   4. Dyslipidemia (high LDL; low HDL)   5. Essential hypertension    PLAN:    In order of problems listed above:  Chest pain: Highly atypical, does not sound like angina pectoris.  Negative workup in the emergency room at the time, no recurrence in over a month.  I do not think it is necessary to pursue direct coronary imaging studies for stress testing, but it is important to prevent progression of atherosclerotic disease. Aortic atherosclerosis/coronary atherosclerosis: Normal caliber aorta and  no symptoms to suggest significant coronary stenoses.  Focus on improving his lipid parameters.  Need an updated lipid profile.  Suspect will recommend statin therapy to achieve  LDL cholesterol less than 70.  GI issues may be worsened by aspirin, not prescribed at this time. HLP: He has a chronically low HDL cholesterol, for which we do not yet have satisfactory pharmacological therapy.  Recommend weight loss, regular physical activity. HTN: Fair control with only alpha-blocker, prescribed for prostatism.           Medication Adjustments/Labs and Tests Ordered: Current medicines are reviewed at length with the patient today.  Concerns regarding medicines are outlined above.  Orders Placed This Encounter  Procedures   Lipid panel   EKG 12-Lead   No orders of the defined types were placed in this encounter.   Patient Instructions  Medication Instructions:  Your physician recommends that you continue on your current medications as directed. Please refer to the Current Medication list given to you today.  *If you need a refill on your cardiac medications before your next appointment, please call your pharmacy*  Lab Work: Your physician recommends that you return for lab work at your earliest convenience:  Fasting Lipid Panel- DO NOT eat or drink past midnight. Okay to have water  If you have labs (blood work) drawn today and your tests are completely normal, you will receive your results only by: [place] (if you have MyChart) OR A paper copy in the mail If you have any lab test that is abnormal or we need to change your treatment, we will call you to review the results.  Testing/Procedures: NONE ordered at this time of appointment   Follow-Up: At [facility], you and your health needs are our priority.  As part of our continuing mission to provide you with exceptional heart care, we have created designated Provider Care Teams.  These Care Teams include your primary  Cardiologist (physician) and Advanced Practice Providers (APPs -  Physician Assistants and Nurse Practitioners) who all work together to provide you with the care you need, when you need it.  We recommend signing up for the patient portal called "MyChart".  Sign up information is provided on this After Visit Summary.  MyChart is used to connect with patients for Virtual Visits (Telemedicine).  Patients are able to view lab/test results, encounter notes, upcoming appointments, etc.  Non-urgent messages can be sent to your provider as well.   To learn more about what you can do with MyChart, go to [email or web address].    Your next appointment:   As Needed if symptoms worsen or fail to improve  The format for your next appointment:   In Person  Provider:   Sanda Klein, MD     Other Instructions   Important Information About Sugar         Signed, Sanda Klein, MD  04/21/2022 2:57 PM    [place]

## 2022-04-18 NOTE — Patient Instructions (Addendum)
Medication Instructions:  Your physician recommends that you continue on your current medications as directed. Please refer to the Current Medication list given to you today.  *If you need a refill on your cardiac medications before your next appointment, please call your pharmacy*  Lab Work: Your physician recommends that you return for lab work at your earliest convenience:  Fasting Lipid Panel- DO NOT eat or drink past midnight. Okay to have water  If you have labs (blood work) drawn today and your tests are completely normal, you will receive your results only by: [place] (if you have MyChart) OR A paper copy in the mail If you have any lab test that is abnormal or we need to change your treatment, we will call you to review the results.  Testing/Procedures: NONE ordered at this time of appointment   Follow-Up: At [facility], you and your health needs are our priority.  As part of our continuing mission to provide you with exceptional heart care, we have created designated Provider Care Teams.  These Care Teams include your primary Cardiologist (physician) and Advanced Practice Providers (APPs -  Physician Assistants and Nurse Practitioners) who all work together to provide you with the care you need, when you need it.  We recommend signing up for the patient portal called "MyChart".  Sign up information is provided on this After Visit Summary.  MyChart is used to connect with patients for Virtual Visits (Telemedicine).  Patients are able to view lab/test results, encounter notes, upcoming appointments, etc.  Non-urgent messages can be sent to your provider as well.   To learn more about what you can do with MyChart, go to [email or web address].    Your next appointment:   As Needed if symptoms worsen or fail to improve  The format for your next appointment:   In Person  Provider:   Sanda Klein, MD     Other Instructions   Important Information About  Sugar

## 2022-04-21 ENCOUNTER — Encounter: Payer: Self-pay | Admitting: Cardiovascular Disease

## 2022-04-22 DIAGNOSIS — E785 Hyperlipidemia, unspecified: Secondary | ICD-10-CM | POA: Diagnosis not present

## 2022-04-23 LAB — LIPID PANEL
Chol/HDL Ratio: 6.5 ratio — ABNORMAL HIGH (ref 0.0–5.0)
Cholesterol, Total: 195 mg/dL (ref 100–199)
HDL: 30 mg/dL — ABNORMAL LOW (ref >39)
LDL Chol Calc (NIH): 139 mg/dL — ABNORMAL HIGH (ref 0–99)
Triglycerides: 143 mg/dL (ref 0–149)
VLDL Cholesterol Cal: 26 mg/dL (ref 5–40)

## 2022-05-12 ENCOUNTER — Other Ambulatory Visit: Payer: Self-pay | Admitting: Family Medicine

## 2022-05-17 ENCOUNTER — Telehealth: Payer: Self-pay | Admitting: Cardiovascular Disease

## 2022-05-17 DIAGNOSIS — E785 Hyperlipidemia, unspecified: Secondary | ICD-10-CM

## 2022-05-17 DIAGNOSIS — I2510 Atherosclerotic heart disease of native coronary artery without angina pectoris: Secondary | ICD-10-CM

## 2022-05-17 MED ORDER — ROSUVASTATIN CALCIUM 10 MG PO TABS: 10.0000 mg | ORAL_TABLET | Freq: Every day | ORAL | 3 refills | Status: DC

## 2022-05-17 NOTE — Telephone Encounter (Signed)
Spoke with patient regarding the following results. Patient made aware and patient verbalized understanding.    Sanda Klein, MD 04/23/2022  8:27 AM EST     Based on the presence of plaque in the coronaries and the aorta seen on on the CT chest, recommend a target LDL of <70. Would like him to take rosuvastatin 10 mg daily and recheck a lipid profile in 3 months. Please Rx 90 day supply and 3 RF if he is in agreement.   Prescription sent to patient preferred pharmacy, and lab orders placed.   Advised patient to call back to office with any issues, questions, or concerns. Patient verbalized understanding.

## 2022-05-17 NOTE — Telephone Encounter (Signed)
Pt returning call for lab results

## 2022-05-17 NOTE — Telephone Encounter (Signed)
Patient stated he has further questions regarding his results and would like a call back to discuss this.

## 2022-05-17 NOTE — Telephone Encounter (Signed)
Attempted to call patient, left message for patient to call back to office.   

## 2022-05-21 NOTE — Telephone Encounter (Signed)
Patient returned call

## 2022-05-21 NOTE — Telephone Encounter (Signed)
Spoke to patient he stated his wife received a message about test results.Stated he will start taking Rosuvastatin 10 mg daily this week.Advised he will need a lipid panel done 3 months after taking.Lab order mailed to patient's home.Advised no lab appointment is needed.

## 2022-06-06 ENCOUNTER — Other Ambulatory Visit: Payer: Self-pay

## 2022-06-06 ENCOUNTER — Telehealth: Payer: Self-pay | Admitting: Cardiovascular Disease

## 2022-06-06 DIAGNOSIS — I2510 Atherosclerotic heart disease of native coronary artery without angina pectoris: Secondary | ICD-10-CM

## 2022-06-06 DIAGNOSIS — E785 Hyperlipidemia, unspecified: Secondary | ICD-10-CM

## 2022-06-06 NOTE — Telephone Encounter (Signed)
Pt returning nurse's call. Please advise

## 2022-06-06 NOTE — Telephone Encounter (Signed)
Spoke to patient lab results given.Stated he just started taking Rosuvastatin 10 mg daily on 05/25/22.He will repeat fasting lipid panel in 3 months.Lab order mailed.

## 2022-06-10 ENCOUNTER — Telehealth: Payer: Self-pay | Admitting: Family Medicine

## 2022-06-10 NOTE — Telephone Encounter (Signed)
Patient called to advise that he would like a referral to a dermatologist for a rash he has on both arms and his head. Advised patient he may need an appt to discuss with his provider before a referral would be placed. Please call patient to advise on mobile number

## 2022-06-10 NOTE — Telephone Encounter (Signed)
Called the patient and scheduled with PCP on 06/11/22 at 11:45 AM

## 2022-06-11 ENCOUNTER — Ambulatory Visit (INDEPENDENT_AMBULATORY_CARE_PROVIDER_SITE_OTHER): Payer: Medicare Other | Admitting: Family Medicine

## 2022-06-11 ENCOUNTER — Encounter: Payer: Self-pay | Admitting: Family Medicine

## 2022-06-11 VITALS — BP 122/74 | HR 84 | Temp 97.9°F | Ht 72.0 in | Wt 247.0 lb

## 2022-06-11 DIAGNOSIS — L308 Other specified dermatitis: Secondary | ICD-10-CM

## 2022-06-11 MED ORDER — TRIAMCINOLONE ACETONIDE 0.1 % EX CREA: 1.0000 | TOPICAL_CREAM | Freq: Two times a day (BID) | CUTANEOUS | 0 refills | Status: DC

## 2022-06-11 NOTE — Patient Instructions (Signed)
Use the cream twice daily.   Try not to scratch as this can make things worse. Avoid scented products while dealing with this. You may resume when the itchiness resolves. Cold/cool compresses can help.   Stop the Crestor for 2 weeks and send me a message with how you are doing.   Let us know if you need anything.

## 2022-06-11 NOTE — Progress Notes (Signed)
Chief Complaint  Patient presents with   Rash    Bill Fox is a 82 y.o. male here for a skin complaint.  Duration: 2 weeks Location: top of head, arms, legs Pruritic? Yes Painful? No Drainage? No New soaps/lotions/topicals/detergents? No- started Crestor just before this started Sick contacts? No Other associated symptoms: no fevers, spreading, redness Therapies tried thus far: none  Past Medical History:  Diagnosis Date   Anxiety    At risk for sleep apnea    STOP-BANG= 5     SENT TO PCP 11-29-2013   Cataract    Chronic fatigue    DJD (degenerative joint disease)    RIGHT KNEE   History of colon polyps    2011  &  2013 --  ADENOMATOUS   History of pulmonary embolism    Hyperlipidemia    pt denies ever having high chol   Hypertension    Right knee meniscal tear    Ulcerative colitis     BP 122/74 (BP Location: Left Arm, Cuff Size: Normal)   Pulse 84   Temp 97.9 F (36.6 C) (Oral)   Ht 6' (1.829 m)   Wt 247 lb (112 kg)   SpO2 97%   BMI 33.50 kg/m  Gen: awake, alert, appearing stated age Lungs: No accessory muscle use Skin: See below. No drainage, erythema, TTP, fluctuance Psych: Age appropriate judgment and insight       Pruritic dermatitis - Plan: triamcinolone cream (KENALOG) 0.1 %  Orders as above. Adverse effect of med. Stop Crestor for 2 weeks and send a message with how he is doing. Try not to scratch. Avoid scented products. Use a scent-free emollient twice daily.  F/u prn. The patient voiced understanding and agreement to the plan.  [place], DO 06/11/22 12:04 PM

## 2022-06-17 ENCOUNTER — Telehealth: Payer: Self-pay | Admitting: Family Medicine

## 2022-06-17 DIAGNOSIS — L308 Other specified dermatitis: Secondary | ICD-10-CM

## 2022-06-17 MED ORDER — TRIAMCINOLONE ACETONIDE 0.1 % EX CREA: 1.0000 | TOPICAL_CREAM | Freq: Two times a day (BID) | CUTANEOUS | 0 refills | Status: DC

## 2022-06-17 NOTE — Telephone Encounter (Signed)
Prescription Request  06/17/2022  Is this a "Controlled Substance" medicine? No  LOV: 06/11/2022  What is the name of the medication or equipment?   triamcinolone cream (KENALOG) 0.1 % [[id number]]   Have you contacted your pharmacy to request a refill? No   Which pharmacy would you like this sent to?   [facility] - [address], [place] [address] Phone: [phone] Fax: [phone]  Patient notified that their request is being sent to the clinical staff for review and that they should receive a response within 2 business days.   Please advise at Mobile [phone] (mobile)

## 2022-06-17 NOTE — Telephone Encounter (Signed)
Sent and patient informed

## 2022-06-26 ENCOUNTER — Telehealth: Payer: Self-pay | Admitting: Family Medicine

## 2022-06-26 NOTE — Telephone Encounter (Signed)
Patient states he was advised to call back in 12 days in regards of a medication he takes at night. He did not know any more details Please advise.

## 2022-06-26 NOTE — Telephone Encounter (Signed)
He cannot tell any difference  Does he start back? He said he is to stay on it until May per his cardiologist.

## 2022-06-26 NOTE — Telephone Encounter (Signed)
Called the patient and he needs to know if he goes back on Crestor?

## 2022-06-26 NOTE — Telephone Encounter (Signed)
Patient informed and will do

## 2022-07-10 ENCOUNTER — Telehealth: Payer: Self-pay | Admitting: Internal Medicine

## 2022-07-10 NOTE — Telephone Encounter (Signed)
Inbound call from patient states he has blood in his stool.  Please advise.   Call back number [phone]

## 2022-07-11 NOTE — Telephone Encounter (Signed)
Spoke with pt and he states he has been having blood in his stool off and on for the past 2-3 weeks. Pt scheduled to see Dr. Henrene Pastor 07/16/22 at 4pm. Pt aware of appt.

## 2022-07-12 ENCOUNTER — Telehealth: Payer: Self-pay | Admitting: Family Medicine

## 2022-07-12 NOTE — Telephone Encounter (Signed)
Patient said the "bad places on his skin" are not getting better and he tried the ointment but they seem twice as big. Please call to advise

## 2022-07-12 NOTE — Telephone Encounter (Signed)
Scheduled with PCP 07/17/22 at 11:15.

## 2022-07-12 NOTE — Telephone Encounter (Signed)
We can refer to derm, though that may take a while, or we can have him come in next week and take a sample of the tissue and send to the lab to get a better diagnosis. Ty.

## 2022-07-16 ENCOUNTER — Encounter: Payer: Self-pay | Admitting: Internal Medicine

## 2022-07-16 ENCOUNTER — Ambulatory Visit (INDEPENDENT_AMBULATORY_CARE_PROVIDER_SITE_OTHER): Payer: Medicare Other | Admitting: Internal Medicine

## 2022-07-16 VITALS — BP 120/80 | HR 100 | Ht 72.0 in | Wt 243.2 lb

## 2022-07-16 DIAGNOSIS — K625 Hemorrhage of anus and rectum: Secondary | ICD-10-CM

## 2022-07-16 DIAGNOSIS — I2510 Atherosclerotic heart disease of native coronary artery without angina pectoris: Secondary | ICD-10-CM

## 2022-07-16 DIAGNOSIS — K51219 Ulcerative (chronic) proctitis with unspecified complications: Secondary | ICD-10-CM

## 2022-07-16 MED ORDER — MESALAMINE 1000 MG RE SUPP: 1000.0000 mg | Freq: Every day | RECTAL | 6 refills | Status: DC

## 2022-07-16 NOTE — Progress Notes (Signed)
HISTORY OF PRESENT ILLNESS:  Bill Fox is a 82 y.o. male with chronic left-sided ulcerative colitis.  Last seen in the office January 25, 2022.  At that time he was treated with prednisone for recent flare which had improved.  He was also being treated for a fissure.  He successfully weaned off prednisone and states that he did well until about 6 weeks ago when he began to notice intermittent red blood per rectum as well as dark red blood.  Bowel movements did not change significantly nor did his chronic urgency.  No abdominal pain.  He is on chronic anticoagulation.  Issues with vision have resolved.  Last hemoglobin from October 2023 was 14.0.  Last complete colonoscopy January 2022 revealed proctosigmoid ulcerative colitis which was moderately severe.  Follow-up in 2 years recommended.  REVIEW OF SYSTEMS:  All non-GI ROS negative unless otherwise stated in the HPI except for skin rash  Past Medical History:  Diagnosis Date   Anxiety    At risk for sleep apnea    STOP-BANG= 5     SENT TO PCP 11-29-2013   Cataract    Chronic fatigue    DJD (degenerative joint disease)    RIGHT KNEE   History of colon polyps    2011  &  2013 --  ADENOMATOUS   History of pulmonary embolism    Hyperlipidemia    pt denies ever having high chol   Hypertension    Right knee meniscal tear    Ulcerative colitis     Past Surgical History:  Procedure Laterality Date   CATARACT EXTRACTION W/ INTRAOCULAR LENS IMPLANT Left 2014   COLONOSCOPY W/ POLYPECTOMY  MULTIPLE--  LAST ONE 09-12-2011   KNEE ARTHROSCOPY WITH DRILLING/MICROFRACTURE Right 12/03/2013   Procedure: RIGHT KNEE ARTHROSCOPY PARTIAL MEDIAL MENISCECTOMY DEBRIDEMENT SYNOVECTOMY;  Surgeon: Johnn Hai, MD;  Location: [place];  Service: Orthopedics;  Laterality: Right;   NASAL FRACTURE SURGERY  1985   TRANSTHORACIC ECHOCARDIOGRAM  11-01-2008   MILD LVH/  EF [id number]  GRADE I DIASTOLIC DYSFUNCTION/  MILD LAE    Social  History TYRESE ZURLO  reports that he quit smoking about 29 years ago. His smoking use included cigarettes. He has a 30.00 pack-year smoking history. He has never used smokeless tobacco. He reports current alcohol use of about 1.0 standard drink of alcohol per week. He reports that he does not use drugs.  family history includes Diabetes in his mother and paternal uncle; Lung cancer in his brother; Stomach cancer in his father.  No Known Allergies     PHYSICAL EXAMINATION: Vital signs: BP 120/80   Pulse 100   Ht 6' (1.829 m)   Wt 243 lb 3.2 oz (110.3 kg)   BMI 32.98 kg/m   Constitutional: generally well-appearing, no acute distress Psychiatric: alert and oriented x3, cooperative Eyes: extraocular movements intact, anicteric, conjunctiva pink Mouth: oral pharynx moist, no lesions Neck: supple no lymphadenopathy Cardiovascular: heart regular rate and rhythm, no murmur Lungs: clear to auscultation bilaterally Abdomen: soft, nontender, nondistended, no obvious ascites, no peritoneal signs, normal bowel sounds, no organomegaly Rectal: Omitted Extremities: no clubbing or cyanosis.  1+.  Lower extremity edema bilaterally Skin: no lesions on visible extremities save for verrucous rash right lower extremity laterally near the ankle Neuro: No focal deficits.  Nerves intact  ASSESSMENT:  1.  Chronic left-sided ulcerative colitis. 2.  Intermittent rectal bleeding.  Likely related to flare of colitis.  Nontoxic 3.  Next colonoscopy  January 2022 4.  History of fissure.  Inactive 5.  Multiple medical problems including history of pulmonary embolism on chronic anticoagulation   PLAN:  1.  Prescribe Canasa suppositories.  1 g per rectum at night. 2.  Office follow-up 6 weeks.  Continue on suppository therapy until that time 3.  Fiber supplementation with Citrucel, daily. 4.  Consider relook colonoscopy A total time of 30 minutes was spent preparing to see the patient, obtaining  comprehensive history, performing medically appropriate physical examination, counseling educating the patient regarding the above listed issues, ordering medication, arranging follow-up, and documenting clinical information in the health record

## 2022-07-16 NOTE — Patient Instructions (Signed)
_______________________________________________________  If your blood pressure at your visit was 140/90 or greater, please contact your primary care physician to follow up on this.  _______________________________________________________  If you are age 80 or older, your body mass index should be between 23-30. Your Body mass index is 32.98 kg/m. If this is out of the aforementioned range listed, please consider follow up with your Primary Care Provider.  If you are age 82 or younger, your body mass index should be between 19-25. Your Body mass index is 32.98 kg/m. If this is out of the aformentioned range listed, please consider follow up with your Primary Care Provider.   ________________________________________________________  The [place] [facility] providers would like to encourage you to use [facility] to communicate with providers for non-urgent requests or questions.  Due to long hold times on the telephone, sending your provider a message by [facility] may be a faster and more efficient way to get a response.  Please allow 48 business hours for a response.  Please remember that this is for non-urgent requests.  _______________________________________________________  We have sent the following medications to your pharmacy for you to pick up at your convenience:  [facility]

## 2022-07-17 ENCOUNTER — Encounter: Payer: Self-pay | Admitting: Family Medicine

## 2022-07-17 ENCOUNTER — Ambulatory Visit (INDEPENDENT_AMBULATORY_CARE_PROVIDER_SITE_OTHER): Payer: Medicare Other | Admitting: Family Medicine

## 2022-07-17 VITALS — BP 122/80 | HR 91 | Temp 98.3°F | Ht 72.0 in | Wt 243.1 lb

## 2022-07-17 DIAGNOSIS — L989 Disorder of the skin and subcutaneous tissue, unspecified: Secondary | ICD-10-CM | POA: Diagnosis not present

## 2022-07-17 MED ORDER — CEPHALEXIN 500 MG PO CAPS: 500.0000 mg | ORAL_CAPSULE | Freq: Three times a day (TID) | ORAL | 0 refills | Status: AC

## 2022-07-17 MED ORDER — MESALAMINE 1000 MG RE SUPP: 1000.0000 mg | Freq: Every day | RECTAL | 3 refills | Status: DC

## 2022-07-17 NOTE — Telephone Encounter (Signed)
Sent [facility] to [facility] in [place]

## 2022-07-17 NOTE — Patient Instructions (Addendum)
Please consider getting your tetanus booster at the pharmacy.   Cancel the appointment if you are doing better.  Continue using the cream on your arms, not your legs.   Let us know if you need anything.

## 2022-07-17 NOTE — Addendum Note (Signed)
Addended by: Audrea Muscat on: 07/17/2022 02:13 PM   Modules accepted: Orders

## 2022-07-17 NOTE — Telephone Encounter (Signed)
Inbound call from patient requesting supp[facility] number for pharmacy is [phone]

## 2022-07-17 NOTE — Progress Notes (Signed)
Chief Complaint  Patient presents with   Skin Problem    Bill Fox is a 82 y.o. male here for a skin complaint.  Duration: 7 weeks Location: armsa nd legs Pruritic? Not on legs, itchy on arms Painful? No Drainage? No New soaps/lotions/topicals/detergents? No Sick contacts? No Other associated symptoms: crusting up more Therapies tried thus far: Kenalog  Past Medical History:  Diagnosis Date   Anxiety    At risk for sleep apnea    STOP-BANG= 5     SENT TO PCP 11-29-2013   Cataract    Chronic fatigue    DJD (degenerative joint disease)    RIGHT KNEE   History of colon polyps    2011  &  2013 --  ADENOMATOUS   History of pulmonary embolism    Hyperlipidemia    pt denies ever having high chol   Hypertension    Right knee meniscal tear    Ulcerative colitis     BP 122/80 (BP Location: Left Arm, Patient Position: Sitting, Cuff Size: Large)   Pulse 91   Temp 98.3 F (36.8 C) (Oral)   Ht 6' (1.829 m)   Wt 243 lb 2 oz (110.3 kg)   SpO2 93%   BMI 32.97 kg/m  Gen: awake, alert, appearing stated age Lungs: No accessory muscle use Skin: see below.  He also has various excoriated papules on his forearms with some scaling.  No drainage, excessive warmth, TTP, fluctuance Psych: Age appropriate judgment and insight   Left medial lower extremity   R lateral LE  Skin lesion  Suspect possible impetigo/skin infection/folliculitis in the legs.  7 days of Keflex.  Follow-up in 6 days if no improvement.  Would consider biopsy versus a prednisone burst to cover for vasculitis.  He will continue triamcinolone cream over his arms as well as using emollients and avoiding scratching. F/u prn. The patient voiced understanding and agreement to the plan.  [place], DO 07/17/22 11:51 AM

## 2022-07-25 ENCOUNTER — Telehealth: Payer: Self-pay | Admitting: Family Medicine

## 2022-07-25 MED ORDER — PREDNISONE 20 MG PO TABS: 40.0000 mg | ORAL_TABLET | Freq: Every day | ORAL | 0 refills | Status: DC

## 2022-07-25 MED ORDER — PREDNISONE 20 MG PO TABS: 40.0000 mg | ORAL_TABLET | Freq: Every day | ORAL | 0 refills | Status: AC

## 2022-07-25 NOTE — Addendum Note (Signed)
Addended by: Ames Coupe on: 07/25/2022 02:29 PM   Modules accepted: Orders

## 2022-07-25 NOTE — Telephone Encounter (Signed)
Patient states his leg sores are not getting any better and was advised to call back if that was the case. He would like to know what else he can do. Please advise.

## 2022-07-25 NOTE — Telephone Encounter (Signed)
Spoke w/ Pt- informed of recommendations. Pt verbalized understanding. Appt scheduled for next week.

## 2022-07-25 NOTE — Telephone Encounter (Signed)
5 d pred burst sent to cover for vasculitis. Plz sched pt in 5-6 d for a skin procedure (punch biopsy). He can cancel appt if he is doing better. Ty.

## 2022-07-31 ENCOUNTER — Encounter: Payer: Self-pay | Admitting: Family Medicine

## 2022-07-31 ENCOUNTER — Ambulatory Visit (INDEPENDENT_AMBULATORY_CARE_PROVIDER_SITE_OTHER): Payer: Medicare Other | Admitting: Family Medicine

## 2022-07-31 VITALS — BP 121/85 | HR 84 | Temp 97.7°F | Ht 72.0 in | Wt 244.5 lb

## 2022-07-31 DIAGNOSIS — L309 Dermatitis, unspecified: Secondary | ICD-10-CM | POA: Diagnosis not present

## 2022-07-31 DIAGNOSIS — L989 Disorder of the skin and subcutaneous tissue, unspecified: Secondary | ICD-10-CM

## 2022-07-31 NOTE — Patient Instructions (Addendum)
Do not shower for the rest of the day. When you do wash it, use only soap and water. Do not vigorously scrub. Apply triple antibiotic ointment (like Neosporin) twice daily. Keep the area clean and dry.   Things to look out for: increasing pain not relieved by ice/acetaminophen, fevers, spreading redness, drainage of pus, or foul odor.  OK to take Tylenol 1000 mg (2 extra strength tabs) or 975 mg (3 regular strength tabs) every 6 hours as needed.  Ice/cold pack over area for 10-15 min twice daily.  Give [place] 1 week to get the results of your biopsy back.  Let us know if you need anything.

## 2022-07-31 NOTE — Progress Notes (Signed)
Chief Complaint  Patient presents with   Procedure    [name] is a 82 y.o. male here for a skin complaint.  Duration: 9 weeks Location: arms and both legs Pruritic? Yes Painful? No Drainage? No New soaps/lotions/topicals/detergents? No Sick contacts? No Other associated symptoms: Had been crusting up Therapies tried thus far: Kenalog, prednisone, Keflex to cover for Impetigo  Past Medical History:  Diagnosis Date   Anxiety    At risk for sleep apnea    STOP-BANG= 5     SENT TO PCP 11-29-2013   Cataract    Chronic fatigue    DJD (degenerative joint disease)    RIGHT KNEE   History of colon polyps    2011  &  2013 --  ADENOMATOUS   History of pulmonary embolism    Hyperlipidemia    pt denies ever having high chol   Hypertension    Right knee meniscal tear    Ulcerative colitis     BP 121/85 (BP Location: Left Arm, Patient Position: Sitting, Cuff Size: Large)   Pulse 84   Temp 97.7 F (36.5 C) (Oral)   Ht 6' (1.829 m)   Wt 244 lb 8 oz (110.9 kg)   SpO2 93%   BMI 33.16 kg/m  Gen: awake, alert, appearing stated age Lungs: No accessory muscle use Skin: Crusted lesions overlying a pink base on legs laterally and medially on R and L respectively. No drainage, TTP, fluctuance, excoriation Psych: Age appropriate judgment and insight  Procedure note; punch biopsy - diagnosis Informed consent was obtained. The area was cleaned with alcohol and then injected with 1 mL of 1% lidocaine with epinephrine. The area was then cleaned with Hibiclens. Vertical traction was placed along the skin and a 4 mm punch biopsy was used. The specimen was grasped with forceps and separated with iris scissors. The specimen was placed in a sterile specimen cup and sent to the lab. 1 simple 5-0 suture placed. TAO and a band-aid were placed.  There were no complications noted. The patient tolerated the procedure well.  Skin lesion - Plan: Surgical pathology  Cont non-scented  emollients, Kenalog prn. Punch biopsy today.  Aftercare instructions verbalized and written down.  Warning signs and symptoms verbalized written down.  Follow-up in 1 week to remove sutures.  Check skin pathology report for further management. The patient voiced understanding and agreement to the plan.  [place], DO 07/31/22 10:21 AM

## 2022-08-06 ENCOUNTER — Other Ambulatory Visit: Payer: Self-pay | Admitting: Family Medicine

## 2022-08-06 MED ORDER — BETAMETHASONE DIPROPIONATE 0.05 % EX CREA: TOPICAL_CREAM | Freq: Two times a day (BID) | CUTANEOUS | 0 refills | Status: DC

## 2022-08-19 ENCOUNTER — Other Ambulatory Visit: Payer: Self-pay | Admitting: Family Medicine

## 2022-08-27 ENCOUNTER — Ambulatory Visit (INDEPENDENT_AMBULATORY_CARE_PROVIDER_SITE_OTHER): Payer: Medicare Other | Admitting: Internal Medicine

## 2022-08-27 ENCOUNTER — Encounter: Payer: Self-pay | Admitting: Internal Medicine

## 2022-08-27 VITALS — BP 110/78 | HR 97 | Ht 72.0 in | Wt 240.0 lb

## 2022-08-27 DIAGNOSIS — K625 Hemorrhage of anus and rectum: Secondary | ICD-10-CM

## 2022-08-27 DIAGNOSIS — K51919 Ulcerative colitis, unspecified with unspecified complications: Secondary | ICD-10-CM

## 2022-08-27 MED ORDER — MESALAMINE 1000 MG RE SUPP: 1000.0000 mg | Freq: Every day | RECTAL | 6 refills | Status: DC

## 2022-08-27 NOTE — Patient Instructions (Signed)
_______________________________________________________  If your blood pressure at your visit was 140/90 or greater, please contact your primary care physician to follow up on this.  _______________________________________________________  If you are age 80 or older, your body mass index should be between 23-30. Your Body mass index is 32.55 kg/m. If this is out of the aforementioned range listed, please consider follow up with your Primary Care Provider.  If you are age 91 or younger, your body mass index should be between 19-25. Your Body mass index is 32.55 kg/m. If this is out of the aformentioned range listed, please consider follow up with your Primary Care Provider.   ________________________________________________________  The [place] [facility] providers would like to encourage you to use [facility] to communicate with providers for non-urgent requests or questions.  Due to long hold times on the telephone, sending your provider a message by [facility] may be a faster and more efficient way to get a response.  Please allow 48 business hours for a response.  Please remember that this is for non-urgent requests.  _______________________________________________________  We have sent the following medications to your pharmacy for you to pick up at your convenience: Canasa suppositories   I appreciate the opportunity to care for you. Yancey Flemings MD

## 2022-08-27 NOTE — Progress Notes (Signed)
HISTORY OF PRESENT ILLNESS:  Bill Fox is a 82 y.o. male chronic left-sided ulcerative colitis.  Last seen in the office July 16, 2022.  See that dictation for detail.  At that time he was experiencing problems with intermittent rectal bleeding felt secondary to a flare of his colitis.  He was prescribed Canasa suppositories 1 g per rectum at night.  Follow-up at this time recommended.  Patient tells me that he was having some difficulties with suppository retention.  Now that he did better when taking in the morning as opposed tonight.  As time is gone on his situation has improved significantly.  No further bleeding.  Bowel movements are for the most part formed.  Occasionally he will have urgency that is quite significant.  No abdominal pain.  No new complaints.  REVIEW OF SYSTEMS:  All non-GI ROS negative as otherwise stated in the HPI except for anxiety  Past Medical History:  Diagnosis Date   Anxiety    At risk for sleep apnea    STOP-BANG= 5     SENT TO PCP 11-29-2013   Cataract    Chronic fatigue    DJD (degenerative joint disease)    RIGHT KNEE   History of colon polyps    2011  &  2013 --  ADENOMATOUS   History of pulmonary embolism    Hyperlipidemia    pt denies ever having high chol   Hypertension    Right knee meniscal tear    Ulcerative colitis     Past Surgical History:  Procedure Laterality Date   CATARACT EXTRACTION W/ INTRAOCULAR LENS IMPLANT Left 2014   COLONOSCOPY W/ POLYPECTOMY  MULTIPLE--  LAST ONE 09-12-2011   KNEE ARTHROSCOPY WITH DRILLING/MICROFRACTURE Right 12/03/2013   Procedure: RIGHT KNEE ARTHROSCOPY PARTIAL MEDIAL MENISCECTOMY DEBRIDEMENT SYNOVECTOMY;  Surgeon: Javier Docker, MD;  Location: [facility];  Service: Orthopedics;  Laterality: Right;   NASAL FRACTURE SURGERY  1985   TRANSTHORACIC ECHOCARDIOGRAM  11-01-2008   MILD LVH/  EF 55%/  GRADE I DIASTOLIC DYSFUNCTION/  MILD LAE    Social History Bill Fox   reports that he quit smoking about 29 years ago. His smoking use included cigarettes. He has a 30.00 pack-year smoking history. He has never used smokeless tobacco. He reports current alcohol use of about 1.0 standard drink of alcohol per week. He reports that he does not use drugs.  family history includes Diabetes in his mother and paternal uncle; Lung cancer in his brother; Stomach cancer in his father.  No Known Allergies     PHYSICAL EXAMINATION: Vital signs: BP 110/78   Pulse 97   Ht 6' (1.829 m)   Wt 240 lb (108.9 kg)   BMI 32.55 kg/m   Constitutional: generally well-appearing, no acute distress Psychiatric: alert and oriented x3, cooperative Eyes: extraocular movements intact, anicteric, conjunctiva pink Mouth: oral pharynx moist, no lesions Neck: supple no lymphadenopathy Cardiovascular: heart regular rate and rhythm, no murmur Lungs: clear to auscultation bilaterally Abdomen: soft, obese, nontender, nondistended, no obvious ascites, no peritoneal signs, normal bowel sounds, no organomegaly Rectal: Omitted Extremities: no clubbing or cyanosis.  1+ lower extremity edema bilaterally Skin: no lesions on visible extremities Neuro: No focal deficits.  Cranial nerves intact  ASSESSMENT:  1.  Chronic left-sided ulcerative colitis.  Recent flare.  Has responded to Canasa suppositories 2.  Previous colonoscopy January 2022 3.  History of anal fissure 4.  Multiple medical problems including history of pulmonary embolism on  Eliquis   PLAN:  1.  Continue with Canasa suppositories. 2.  Canasa suppository prescription refilled 3.  Routine GI follow-up 6 months.  Contact the office in the interim for any questions or problems

## 2022-08-27 NOTE — Addendum Note (Signed)
Addended by: [place], Nichole Neyer E on: 08/27/2022 04:11 PM   Modules accepted: Orders

## 2022-09-03 ENCOUNTER — Telehealth: Payer: Self-pay | Admitting: Emergency Medicine

## 2022-09-03 NOTE — Telephone Encounter (Signed)
Left message stating that he needs to come in some time this week for fasting lab work.

## 2022-10-10 ENCOUNTER — Other Ambulatory Visit: Payer: Self-pay | Admitting: Family Medicine

## 2022-10-22 ENCOUNTER — Ambulatory Visit: Payer: Medicare Other

## 2022-10-24 ENCOUNTER — Telehealth: Payer: Self-pay | Admitting: Family Medicine

## 2022-10-24 NOTE — Telephone Encounter (Signed)
We need to use Aquaphor every time it gets scaly/dry (usually every 2-4 hours). Use the cream in the morning and then apply Aquaphor. At night, use the cream, then apply Aquaphor, and then wrap with saran wrap. We referred him to [facility] in Nov, did he ever establish? If not, please place referral to derm again. Ty.

## 2022-10-24 NOTE — Telephone Encounter (Signed)
Nurse was unavailable to speak with pt. I let the pt know what was stated by the PCP and he said "none of the stuff given work." Also the pt was not aware that he was referred to go to the [facility] and asked if that referral can be placed again so he can get an appt with them. Pt will call back tomorrow morning to try to speak with a nurse again.

## 2022-10-24 NOTE — Telephone Encounter (Signed)
Patient has called stating that the rash cream given to him is not working and the rash is actually getting worse on his leg. Please advise and give pt a call [phone]

## 2022-10-25 ENCOUNTER — Other Ambulatory Visit: Payer: Self-pay | Admitting: Family Medicine

## 2022-10-25 DIAGNOSIS — L989 Disorder of the skin and subcutaneous tissue, unspecified: Secondary | ICD-10-CM

## 2022-10-25 NOTE — Telephone Encounter (Signed)
Called left message to call back 

## 2022-10-25 NOTE — Telephone Encounter (Signed)
Called the patient to inform he will need to call around to Derm offces to see if he can find one that has appt before 9 months out then let us know and [facility] will send referral there Was not able to LM VM full.

## 2022-10-25 NOTE — Telephone Encounter (Signed)
Pt states dermatology is booked for 9 months so he would like to see another office.

## 2022-10-25 NOTE — Telephone Encounter (Signed)
Sent new derm referral.

## 2022-10-25 NOTE — Addendum Note (Signed)
Addended by: Scharlene Gloss B on: 10/25/2022 09:58 AM   Modules accepted: Orders

## 2022-10-25 NOTE — Telephone Encounter (Signed)
Referral placed.

## 2022-10-28 ENCOUNTER — Telehealth: Payer: Self-pay | Admitting: Family Medicine

## 2022-10-28 NOTE — Telephone Encounter (Signed)
Please refer to other phone note from last week/dated 10/25/2022 Will close this note. Reason for message is the same.

## 2022-10-28 NOTE — Telephone Encounter (Signed)
Called and cell number no answer/VM full Called the home number left message to call back,

## 2022-10-28 NOTE — Telephone Encounter (Signed)
Pt called stating that he is having issues with getting in with a Dermatologist before October and his issue has not resolved. Pt would like a call back at the following number:  [phone]

## 2022-10-30 ENCOUNTER — Ambulatory Visit (INDEPENDENT_AMBULATORY_CARE_PROVIDER_SITE_OTHER): Payer: Medicare Other | Admitting: *Deleted

## 2022-10-30 VITALS — BP 132/82 | HR 83 | Ht 72.0 in | Wt 239.0 lb

## 2022-10-30 DIAGNOSIS — Z0000 Encounter for general adult medical examination without abnormal findings: Secondary | ICD-10-CM

## 2022-10-30 NOTE — Progress Notes (Signed)
Subjective:   Bill Fox is a 82 y.o. male who presents for Medicare Annual/Subsequent preventive examination.  Review of Systems     Cardiac Risk Factors include: advanced age (>73men, >60 women);dyslipidemia;male gender;hypertension     Objective:    Today's Vitals   10/30/22 1347 10/30/22 1410  BP: (!) 146/76 132/82  Pulse: 91 83  Weight: 239 lb (108.4 kg)   Height: 6' (1.829 m)    Body mass index is 32.41 kg/m.     10/30/2022    1:49 PM 03/17/2022    6:28 PM 10/18/2021    3:10 PM 11/21/2020   10:19 AM 11/20/2020    2:13 PM 10/12/2020    2:47 PM 09/09/2019    9:34 AM  Advanced Directives  Does Patient Have a Medical Advance Directive? No No Yes No No No No  Type of Surveyor, minerals;Out of facility DNR (pink MOST or yellow form);Living will      Does patient want to make changes to medical advance directive?   No - Patient declined      Copy of Healthcare Power of Attorney in Chart?   No - copy requested      Would patient like information on creating a medical advance directive? No - Patient declined No - Patient declined  No - Patient declined  Yes (MAU/Ambulatory/Procedural Areas - Information given) No - Patient declined    Current Medications (verified) Outpatient Encounter Medications as of 10/30/2022  Medication Sig   doxazosin (CARDURA) 8 MG tablet TAKE ONE-HALF (1/2) TABLET AT BEDTIME   ELIQUIS 5 MG TABS tablet TAKE 2 TABLETS TWICE A DAY FOR 5 DAYS, THEN 1 TABLET TWICE A DAY THEREAFTER   mesalamine (APRISO) 0.375 g 24 hr capsule Take 4 capsules (1.5 g total) by mouth daily.   mesalamine (CANASA) 1000 MG suppository Place 1 suppository (1,000 mg total) rectally at bedtime.   mirtazapine (REMERON) 30 MG tablet TAKE 1 TABLET AT BEDTIME   rosuvastatin (CRESTOR) 10 MG tablet Take 1 tablet (10 mg total) by mouth daily.   terbinafine (LAMISIL) 250 MG tablet Take 1 tablet (250 mg total) by mouth daily.   [DISCONTINUED] betamethasone  dipropionate 0.05 % cream APPLY ONE APPLICATION TWICE DAILY   No facility-administered encounter medications on file as of 10/30/2022.    Allergies (verified) Patient has no known allergies.   History: Past Medical History:  Diagnosis Date   Anxiety    At risk for sleep apnea    STOP-BANG= 5     SENT TO PCP 11-29-2013   Cataract    Chronic fatigue    DJD (degenerative joint disease)    RIGHT KNEE   History of colon polyps    2011  &  2013 --  ADENOMATOUS   History of pulmonary embolism    Hyperlipidemia    pt denies ever having high chol   Hypertension    Right knee meniscal tear    Ulcerative colitis    Past Surgical History:  Procedure Laterality Date   CATARACT EXTRACTION W/ INTRAOCULAR LENS IMPLANT Left 2014   COLONOSCOPY W/ POLYPECTOMY  MULTIPLE--  LAST ONE 09-12-2011   KNEE ARTHROSCOPY WITH DRILLING/MICROFRACTURE Right 12/03/2013   Procedure: RIGHT KNEE ARTHROSCOPY PARTIAL MEDIAL MENISCECTOMY DEBRIDEMENT SYNOVECTOMY;  Surgeon: Javier Docker, MD;  Location:  [facility];  Service: Orthopedics;  Laterality: Right;   NASAL FRACTURE SURGERY  1985   TRANSTHORACIC ECHOCARDIOGRAM  11-01-2008   MILD LVH/  EF  55%/  GRADE I DIASTOLIC DYSFUNCTION/  MILD LAE   Family History  Problem Relation Age of Onset   Diabetes Mother    Stomach cancer Father    Lung cancer Brother    Diabetes Paternal Uncle        x 2   Colon cancer Neg Hx    Pancreatic cancer Neg Hx    Esophageal cancer Neg Hx    Colon polyps Neg Hx    Social History   Socioeconomic History   Marital status: Married    Spouse name: Not on file   Number of children: Not on file   Years of education: Not on file   Highest education level: Not on file  Occupational History   Occupation: retired  Tobacco Use   Smoking status: Former    Packs/day: 1.00    Years: 30.00    Additional pack years: 0.00    Total pack years: 30.00    Types: Cigarettes    Quit date: 05/20/1993    Years since  quitting: 29.4   Smokeless tobacco: Never  Vaping Use   Vaping Use: Never used  Substance and Sexual Activity   Alcohol use: Yes    Alcohol/week: 1.0 standard drink of alcohol    Types: 1 Cans of beer per week    Comment: beer on occ   Drug use: No   Sexual activity: Yes  Other Topics Concern   Not on file  Social History Narrative   Not on file   Social Determinants of Health   Financial Resource Strain: Low Risk  (10/18/2021)   Overall Financial Resource Strain (CARDIA)    Difficulty of Paying Living Expenses: Not hard at all  Food Insecurity: No Food Insecurity (10/30/2022)   Hunger Vital Sign    Worried About Running Out of Food in the Last Year: Never true    Ran Out of Food in the Last Year: Never true  Transportation Needs: No Transportation Needs (10/30/2022)   PRAPARE - Administrator, Civil Service (Medical): No    Lack of Transportation (Non-Medical): No  Physical Activity: Insufficiently Active (10/18/2021)   Exercise Vital Sign    Days of Exercise per Week: 7 days    Minutes of Exercise per Session: 20 min  Stress: No Stress Concern Present (10/18/2021)   [employer] of Occupational Health - Occupational Stress Questionnaire    Feeling of Stress : Not at all  Social Connections: Moderately Integrated (10/18/2021)   Social Connection and Isolation Panel [NHANES]    Frequency of Communication with Friends and Family: More than three times a week    Frequency of Social Gatherings with Friends and Family: More than three times a week    Attends Religious Services: More than 4 times per year    Active Member of [employer] or Organizations: Yes    Attends Engineer, structural: More than 4 times per year    Marital Status: Divorced    Tobacco Counseling Counseling given: Not Answered   Clinical Intake:  Pre-visit preparation completed: Yes  Pain : No/denies pain     BMI - recorded: 32.41 Nutritional Status: BMI > 30  Obese Nutritional  Risks: None Diabetes: No  How often do you need to have someone help you when you read instructions, pamphlets, or other written materials from your doctor or pharmacy?: 1 - Never  Activities of Daily Living    10/30/2022    1:58 PM  In your present state  of health, do you have any difficulty performing the following activities:  Hearing? 0  Vision? 0  Difficulty concentrating or making decisions? 1  Comment very slight difficulty with memory  Walking or climbing stairs? 0  Dressing or bathing? 0  Doing errands, shopping? 0  Preparing Food and eating ? N  Using the Toilet? N  In the past six months, have you accidently leaked urine? N  Do you have problems with loss of bowel control? Y  Comment ulcerative colitis  Managing your Medications? N  Managing your Finances? N  Housekeeping or managing your Housekeeping? N    Patient Care Team: Sharlene Dory, DO as PCP - General (Family Medicine) Croitoru, Rachelle Hora, MD as PCP - Cardiology (Cardiology)  Indicate any recent Medical Services you may have received from other than [facility] providers in the past year (date may be approximate).     Assessment:   This is a routine wellness examination for [place].  Hearing/Vision screen No results found.  Dietary issues and exercise activities discussed: Current Exercise Habits: The patient does not participate in regular exercise at present, Exercise limited by: None identified   Goals Addressed   None    Depression Screen    10/30/2022    1:57 PM 10/18/2021    3:14 PM 10/12/2020    2:52 PM 09/09/2019    9:39 AM 09/03/2018    9:18 AM 09/01/2017    9:32 AM  PHQ 2/9 Scores  PHQ - 2 Score 0 0 0 0 0 0    Fall Risk    10/30/2022    1:57 PM 06/11/2022   11:56 AM 10/18/2021    3:12 PM 10/12/2020    2:51 PM 09/09/2019    9:39 AM  Fall Risk   Falls in the past year? 0 0 1 0 1  Number falls in past yr: 0 0 0 0 0  Injury with Fall? 0 0 0 0 1  Risk for fall due to : No Fall Risks  Impaired mobility History of fall(s)    Follow up Falls evaluation completed  Falls evaluation completed Falls prevention discussed Education provided;Falls prevention discussed    FALL RISK PREVENTION PERTAINING TO THE HOME:  Any stairs in or around the home? Yes  If so, are there any without handrails? No  Home free of loose throw rugs in walkways, pet beds, electrical cords, etc? Yes  Adequate lighting in your home to reduce risk of falls? Yes   ASSISTIVE DEVICES UTILIZED TO PREVENT FALLS:  Life alert? No  Use of a cane, walker or w/c? No  Grab bars in the bathroom? No  Shower chair or bench in shower? No  Elevated toilet seat or a handicapped toilet? No   TIMED UP AND GO:  Was the test performed? Yes .  Length of time to ambulate 10 feet: 8 sec.   Gait steady and fast without use of assistive device  Cognitive Function:    09/01/2017    9:33 AM  MMSE - Mini Mental State Exam  Orientation to time 5  Orientation to Place 5  Registration 3  Attention/ Calculation 5  Recall 3  Language- name 2 objects 2  Language- repeat 1  Language- follow 3 step command 3  Language- read & follow direction 1  Write a sentence 1  Copy design 1  Total score 30        10/30/2022    2:06 PM 10/18/2021    3:35 PM 10/18/2021  3:22 PM  6CIT Screen  What Year? 0 points 0 points 0 points  What month? 0 points 0 points 0 points  What time? 3 points 0 points 0 points  Count back from 20 0 points 0 points 0 points  Months in reverse 0 points 0 points 0 points  Repeat phrase 2 points 10 points 10 points  Total Score 5 points 10 points 10 points    Immunizations Immunization History  Administered Date(s) Administered   Influenza Split 02/18/2016   Influenza Whole 02/16/2009, 01/31/2010   Influenza, High Dose Seasonal PF 03/11/2018, 01/28/2022   Influenza,inj,Quad PF,6+ Mos 03/03/2014, 01/30/2015   Influenza-Unspecified 03/12/2021   Janssen (J&J) SARS-COV-2 Vaccination 07/29/2019    Moderna Covid-19 Vaccine Bivalent Booster 37yrs & up 03/02/2021   Moderna Sars-Covid-2 Vaccination 05/22/2020, 11/19/2020   Pneumococcal Conjugate-13 01/30/2015   Pneumococcal Polysaccharide-23 06/20/2008   Tdap 09/03/2011    TDAP status: Due, Education has been provided regarding the importance of this vaccine. Advised may receive this vaccine at local pharmacy or Health Dept. Aware to provide a copy of the vaccination record if obtained from local pharmacy or Health Dept. Verbalized acceptance and understanding.  Flu Vaccine status: Up to date  Pneumococcal vaccine status: Up to date  Covid-19 vaccine status: Information provided on how to obtain vaccines.   Qualifies for Shingles Vaccine? Yes   Zostavax completed No   Shingrix Completed?: No.    Education has been provided regarding the importance of this vaccine. Patient has been advised to call insurance company to determine out of pocket expense if they have not yet received this vaccine. Advised may also receive vaccine at local pharmacy or Health Dept. Verbalized acceptance and understanding.  Screening Tests Health Maintenance  Topic Date Due   DTaP/Tdap/Td (2 - Td or Tdap) 09/02/2021   Medicare Annual Wellness (AWV)  10/19/2022   COVID-19 Vaccine (5 - 2023-24 season) 01/15/2023 (Originally 01/18/2022)   Zoster Vaccines- Shingrix (1 of 2) 01/15/2023 (Originally 03/20/1960)   INFLUENZA VACCINE  12/19/2022   Colonoscopy  05/31/2023   Pneumonia Vaccine 72+ Years old  Completed   Hepatitis C Screening  Completed   HPV VACCINES  Aged Out    Health Maintenance  Health Maintenance Due  Topic Date Due   DTaP/Tdap/Td (2 - Td or Tdap) 09/02/2021   Medicare Annual Wellness (AWV)  10/19/2022    Colorectal cancer screening: Type of screening: Colonoscopy. Completed 05/30/20. Repeat every 3 years  Lung Cancer Screening: (Low Dose CT Chest recommended if Age 39-80 years, 30 pack-year currently smoking OR have quit w/in 15years.) does  not qualify.   Additional Screening:  Hepatitis C Screening: does qualify; Completed 04/02/21  Vision Screening: Recommended annual ophthalmology exams for early detection of glaucoma and other disorders of the eye. Is the patient up to date with their annual eye exam?  Yes  Who is the provider or what is the name of the office in which the patient attends annual eye exams? [facility] If pt is not established with a provider, would they like to be referred to a provider to establish care? No .   Dental Screening: Recommended annual dental exams for proper oral hygiene  Community Resource Referral / Chronic Care Management: CRR required this visit?  No   CCM required this visit?  No      Plan:     I have personally reviewed and noted the following in the patient's chart:   Medical and social history Use of alcohol, tobacco  or illicit drugs  Current medications and supplements including opioid prescriptions. Patient is not currently taking opioid prescriptions. Functional ability and status Nutritional status Physical activity Advanced directives List of other physicians Hospitalizations, surgeries, and ER visits in previous 12 months Vitals Screenings to include cognitive, depression, and falls Referrals and appointments  In addition, I have reviewed and discussed with patient certain preventive protocols, quality metrics, and best practice recommendations. A written personalized care plan for preventive services as well as general preventive health recommendations were provided to patient.     Donne Anon, [place]   10/30/2022   Nurse Notes: None

## 2022-10-30 NOTE — Telephone Encounter (Signed)
Called the patient and [facility] was full

## 2022-10-30 NOTE — Patient Instructions (Signed)
[name] , Thank you for taking time to come for your Medicare Wellness Visit. I appreciate your ongoing commitment to your health goals. Please review the following plan we discussed and let me know if I can assist you in the future.      This is a list of the screening recommended for you and due dates:  Health Maintenance  Topic Date Due   DTaP/Tdap/Td vaccine (2 - Td or Tdap) 09/02/2021   COVID-19 Vaccine (5 - 2023-24 season) 01/15/2023*   Zoster (Shingles) Vaccine (1 of 2) 01/15/2023*   Flu Shot  12/19/2022   Colon Cancer Screening  05/31/2023   Medicare Annual Wellness Visit  10/30/2023   Pneumonia Vaccine  Completed   Hepatitis C Screening  Completed   HPV Vaccine  Aged Out  *Topic was postponed. The date shown is not the original due date.    Next appointment: Follow up in one year for your annual wellness visit.   Preventive Care 80 Years and Older, Male Preventive care refers to lifestyle choices and visits with your health care provider that can promote health and wellness. What does preventive care include? A yearly physical exam. This is also called an annual well check. Dental exams once or twice a year. Routine eye exams. Ask your health care provider how often you should have your eyes checked. Personal lifestyle choices, including: Daily care of your teeth and gums. Regular physical activity. Eating a healthy diet. Avoiding tobacco and drug use. Limiting alcohol use. Practicing safe sex. Taking low doses of aspirin every day. Taking vitamin and mineral supplements as recommended by your health care provider. What happens during an annual well check? The services and screenings done by your health care provider during your annual well check will depend on your age, overall health, lifestyle risk factors, and family history of disease. Counseling  Your health care provider may ask you questions about your: Alcohol use. Tobacco use. Drug use. Emotional  well-being. Home and relationship well-being. Sexual activity. Eating habits. History of falls. Memory and ability to understand (cognition). Work and work Astronomer. Screening  You may have the following tests or measurements: Height, weight, and BMI. Blood pressure. Lipid and cholesterol levels. These may be checked every 5 years, or more frequently if you are over 80 years old. Skin check. Lung cancer screening. You may have this screening every year starting at age 80 if you have a 30-pack-year history of smoking and currently smoke or have quit within the past 15 years. Fecal occult blood test (FOBT) of the stool. You may have this test every year starting at age 80. Flexible sigmoidoscopy or colonoscopy. You may have a sigmoidoscopy every 5 years or a colonoscopy every 10 years starting at age 80. Prostate cancer screening. Recommendations will vary depending on your family history and other risks. Hepatitis C blood test. Hepatitis B blood test. Sexually transmitted disease (STD) testing. Diabetes screening. This is done by checking your blood sugar (glucose) after you have not eaten for a while (fasting). You may have this done every 1-3 years. Abdominal aortic aneurysm (AAA) screening. You may need this if you are a current or former smoker. Osteoporosis. You may be screened starting at age 80 if you are at high risk. Talk with your health care provider about your test results, treatment options, and if necessary, the need for more tests. Vaccines  Your health care provider may recommend certain vaccines, such as: Influenza vaccine. This is recommended every year. Tetanus,  diphtheria, and acellular pertussis (Tdap, Td) vaccine. You may need a Td booster every 10 years. Zoster vaccine. You may need this after age 80. Pneumococcal 13-valent conjugate (PCV13) vaccine. One dose is recommended after age 80. Pneumococcal polysaccharide (PPSV23) vaccine. One dose is recommended after  age 80. Talk to your health care provider about which screenings and vaccines you need and how often you need them. This information is not intended to replace advice given to you by your health care provider. Make sure you discuss any questions you have with your health care provider. Document Released: 06/02/2015 Document Revised: 01/24/2016 Document Reviewed: 03/07/2015 Elsevier Interactive Patient Education  2017 [employer].  Fall Prevention in the Home Falls can cause injuries. They can happen to people of all ages. There are many things you can do to make your home safe and to help prevent falls. What can I do on the outside of my home? Regularly fix the edges of walkways and driveways and fix any cracks. Remove anything that might make you trip as you walk through a door, such as a raised step or threshold. Trim any bushes or trees on the path to your home. Use bright outdoor lighting. Clear any walking paths of anything that might make someone trip, such as rocks or tools. Regularly check to see if handrails are loose or broken. Make sure that both sides of any steps have handrails. Any raised decks and porches should have guardrails on the edges. Have any leaves, snow, or ice cleared regularly. Use sand or salt on walking paths during winter. Clean up any spills in your garage right away. This includes oil or grease spills. What can I do in the bathroom? Use night lights. Install grab bars by the toilet and in the tub and shower. Do not use towel bars as grab bars. Use non-skid mats or decals in the tub or shower. If you need to sit down in the shower, use a plastic, non-slip stool. Keep the floor dry. Clean up any water that spills on the floor as soon as it happens. Remove soap buildup in the tub or shower regularly. Attach bath mats securely with double-sided non-slip rug tape. Do not have throw rugs and other things on the floor that can make you trip. What can I do in the  bedroom? Use night lights. Make sure that you have a light by your bed that is easy to reach. Do not use any sheets or blankets that are too big for your bed. They should not hang down onto the floor. Have a firm chair that has side arms. You can use this for support while you get dressed. Do not have throw rugs and other things on the floor that can make you trip. What can I do in the kitchen? Clean up any spills right away. Avoid walking on wet floors. Keep items that you use a lot in easy-to-reach places. If you need to reach something above you, use a strong step stool that has a grab bar. Keep electrical cords out of the way. Do not use floor polish or wax that makes floors slippery. If you must use wax, use non-skid floor wax. Do not have throw rugs and other things on the floor that can make you trip. What can I do with my stairs? Do not leave any items on the stairs. Make sure that there are handrails on both sides of the stairs and use them. Fix handrails that are broken or loose. Make  sure that handrails are as long as the stairways. Check any carpeting to make sure that it is firmly attached to the stairs. Fix any carpet that is loose or worn. Avoid having throw rugs at the top or bottom of the stairs. If you do have throw rugs, attach them to the floor with carpet tape. Make sure that you have a light switch at the top of the stairs and the bottom of the stairs. If you do not have them, ask someone to add them for you. What else can I do to help prevent falls? Wear shoes that: Do not have high heels. Have rubber bottoms. Are comfortable and fit you well. Are closed at the toe. Do not wear sandals. If you use a stepladder: Make sure that it is fully opened. Do not climb a closed stepladder. Make sure that both sides of the stepladder are locked into place. Ask someone to hold it for you, if possible. Clearly mark and make sure that you can see: Any grab bars or  handrails. First and last steps. Where the edge of each step is. Use tools that help you move around (mobility aids) if they are needed. These include: Canes. Walkers. Scooters. Crutches. Turn on the lights when you go into a dark area. Replace any light bulbs as soon as they burn out. Set up your furniture so you have a clear path. Avoid moving your furniture around. If any of your floors are uneven, fix them. If there are any pets around you, be aware of where they are. Review your medicines with your doctor. Some medicines can make you feel dizzy. This can increase your chance of falling. Ask your doctor what other things that you can do to help prevent falls. This information is not intended to replace advice given to you by your health care provider. Make sure you discuss any questions you have with your health care provider. Document Released: 03/02/2009 Document Revised: 10/12/2015 Document Reviewed: 06/10/2014 Elsevier Interactive Patient Education  2017 [employer].

## 2022-11-01 ENCOUNTER — Ambulatory Visit (INDEPENDENT_AMBULATORY_CARE_PROVIDER_SITE_OTHER): Payer: Medicare Other | Admitting: Family Medicine

## 2022-11-01 ENCOUNTER — Encounter: Payer: Self-pay | Admitting: Family Medicine

## 2022-11-01 VITALS — BP 121/70 | HR 76 | Temp 97.6°F | Ht 72.0 in | Wt 238.0 lb

## 2022-11-01 DIAGNOSIS — L989 Disorder of the skin and subcutaneous tissue, unspecified: Secondary | ICD-10-CM

## 2022-11-01 MED ORDER — TRIAMCINOLONE ACETONIDE 0.5 % EX OINT: 1.0000 | TOPICAL_OINTMENT | Freq: Two times a day (BID) | CUTANEOUS | 0 refills | Status: DC

## 2022-11-01 NOTE — Telephone Encounter (Signed)
Patient in the office today 11/01/2022 will discuss

## 2022-11-01 NOTE — Telephone Encounter (Signed)
Will close note Patient has appt today 11/01/2022

## 2022-11-01 NOTE — Patient Instructions (Addendum)
Please call if you don't hear anything about your referral in the next few business days.   [facility]: [phone]   Let us know if you need anything.

## 2022-11-01 NOTE — Progress Notes (Signed)
Chief Complaint  Patient presents with   Rash    CINCH RIPPLE is a 82 y.o. male here for a skin complaint.  Duration: several months Location: R leg Pruritic? Yes Painful? No Drainage? No New soaps/lotions/topicals/detergents? No Sick contacts? No Other associated symptoms: no fevers or spreading; was getting better and ten started coming back Therapies tried thus far: nothing recently  Past Medical History:  Diagnosis Date   Anxiety    At risk for sleep apnea    STOP-BANG= 5     SENT TO PCP 11-29-2013   Cataract    Chronic fatigue    DJD (degenerative joint disease)    RIGHT KNEE   History of colon polyps    2011  &  2013 --  ADENOMATOUS   History of pulmonary embolism    Hyperlipidemia    pt denies ever having high chol   Hypertension    Right knee meniscal tear    Ulcerative colitis     BP 121/70 (BP Location: Left Arm, Patient Position: Sitting, Cuff Size: Large)   Pulse 76   Temp 97.6 F (36.4 C) (Oral)   Ht 6' (1.829 m)   Wt 238 lb (108 kg)   SpO2 95%   BMI 32.28 kg/m  Gen: awake, alert, appearing stated age Lungs: No accessory muscle use Skin: See below. No drainage, erythema, TTP, fluctuance, excoriation Psych: Age appropriate judgment and insight    Skin lesion - Plan: triamcinolone ointment (KENALOG) 0.5 %  Have been working to get him in with derm. Cream as above.  He will call around and we will see if we can do anything from our end. F/u prn. The patient voiced understanding and agreement to the plan.  Jilda Roche [place], DO 11/01/22 11:52 AM

## 2022-11-04 ENCOUNTER — Telehealth: Payer: Self-pay | Admitting: Family Medicine

## 2022-11-04 NOTE — Telephone Encounter (Signed)
Patient called and wanted to inform nurse that he has an appt with [facility] 8.22 at 2pm.

## 2022-11-13 ENCOUNTER — Encounter: Payer: Self-pay | Admitting: Internal Medicine

## 2022-11-25 ENCOUNTER — Telehealth: Payer: Self-pay | Admitting: Internal Medicine

## 2022-11-25 NOTE — Telephone Encounter (Signed)
Inbound call from requesting to speak with a nurse in regards to a letter he received. Please advise.  Thank you

## 2022-11-25 NOTE — Telephone Encounter (Signed)
Pt states he was scheduled to see Dr. Marina Goodell in October but he is having issues with diarrhea. Reports he got a letter stating he dd not need a colon done. He is concerned about this and states if he can't get one here he will go somewhere else. Pt scheduled to see Dr. Marina Goodell 11/27/22 at 10am. Pt aware of appt.

## 2022-11-27 ENCOUNTER — Other Ambulatory Visit (INDEPENDENT_AMBULATORY_CARE_PROVIDER_SITE_OTHER): Payer: Medicare Other

## 2022-11-27 ENCOUNTER — Encounter: Payer: Self-pay | Admitting: Internal Medicine

## 2022-11-27 ENCOUNTER — Telehealth: Payer: Self-pay | Admitting: Internal Medicine

## 2022-11-27 ENCOUNTER — Ambulatory Visit (INDEPENDENT_AMBULATORY_CARE_PROVIDER_SITE_OTHER): Payer: Medicare Other | Admitting: Internal Medicine

## 2022-11-27 VITALS — BP 130/66 | HR 85 | Ht 72.0 in | Wt 232.0 lb

## 2022-11-27 DIAGNOSIS — K625 Hemorrhage of anus and rectum: Secondary | ICD-10-CM | POA: Diagnosis not present

## 2022-11-27 DIAGNOSIS — E8889 Other specified metabolic disorders: Secondary | ICD-10-CM | POA: Diagnosis not present

## 2022-11-27 DIAGNOSIS — E785 Hyperlipidemia, unspecified: Secondary | ICD-10-CM

## 2022-11-27 DIAGNOSIS — R197 Diarrhea, unspecified: Secondary | ICD-10-CM | POA: Diagnosis not present

## 2022-11-27 DIAGNOSIS — K51919 Ulcerative colitis, unspecified with unspecified complications: Secondary | ICD-10-CM

## 2022-11-27 LAB — COMPREHENSIVE METABOLIC PANEL
ALT: 8 U/L (ref 0–53)
AST: 12 U/L (ref 0–37)
Albumin: 4 g/dL (ref 3.5–5.2)
Alkaline Phosphatase: 64 U/L (ref 39–117)
BUN: 15 mg/dL (ref 6–23)
CO2: 25 mEq/L (ref 19–32)
Calcium: 9.1 mg/dL (ref 8.4–10.5)
Chloride: 105 mEq/L (ref 96–112)
Creatinine, Ser: 1.22 mg/dL (ref 0.40–1.50)
GFR: 56.32 mL/min — ABNORMAL LOW (ref >60.00)
Glucose, Bld: 100 mg/dL — ABNORMAL HIGH (ref 70–99)
Potassium: 4 mEq/L (ref 3.5–5.1)
Sodium: 138 mEq/L (ref 135–145)
Total Bilirubin: 0.4 mg/dL (ref 0.2–1.2)
Total Protein: 7.7 g/dL (ref 6.0–8.3)

## 2022-11-27 LAB — CBC WITH DIFFERENTIAL/PLATELET
Basophils Absolute: 0.1 10*3/uL (ref 0.0–0.1)
Basophils Relative: 1.6 % (ref 0.0–3.0)
Eosinophils Absolute: 0.2 10*3/uL (ref 0.0–0.7)
Eosinophils Relative: 3.1 % (ref 0.0–5.0)
HCT: 39.3 % (ref 39.0–52.0)
Hemoglobin: 12.6 g/dL — ABNORMAL LOW (ref 13.0–17.0)
Lymphocytes Relative: 28.1 % (ref 12.0–46.0)
Lymphs Abs: 1.9 10*3/uL (ref 0.7–4.0)
MCHC: 32.2 g/dL (ref 30.0–36.0)
MCV: 81 fl (ref 78.0–100.0)
Monocytes Absolute: 0.9 10*3/uL (ref 0.1–1.0)
Monocytes Relative: 12.7 % — ABNORMAL HIGH (ref 3.0–12.0)
Neutro Abs: 3.7 10*3/uL (ref 1.4–7.7)
Neutrophils Relative %: 54.5 % (ref 43.0–77.0)
Platelets: 249 10*3/uL (ref 150.0–400.0)
RBC: 4.85 Mil/uL (ref 4.22–5.81)
RDW: 14.9 % (ref 11.5–15.5)
WBC: 6.8 10*3/uL (ref 4.0–10.5)

## 2022-11-27 LAB — HIGH SENSITIVITY CRP: CRP, High Sensitivity: 16.57 mg/L — ABNORMAL HIGH (ref 0.000–5.000)

## 2022-11-27 MED ORDER — PREDNISONE 20 MG PO TABS: ORAL_TABLET | ORAL | 2 refills | Status: AC

## 2022-11-27 NOTE — Patient Instructions (Signed)
Your provider has requested that you go to the basement level for lab work before leaving today. Press "B" on the elevator. The lab is located at the first door on the left as you exit the elevator.  We have sent the following medications to your pharmacy for you to pick up at your convenience:  Prednisone  _______________________________________________________  If your blood pressure at your visit was 140/90 or greater, please contact your primary care physician to follow up on this.  _______________________________________________________  If you are age 80 or older, your body mass index should be between 23-30. Your Body mass index is 31.46 kg/m. If this is out of the aforementioned range listed, please consider follow up with your Primary Care Provider.  If you are age 61 or younger, your body mass index should be between 19-25. Your Body mass index is 31.46 kg/m. If this is out of the aformentioned range listed, please consider follow up with your Primary Care Provider.   ________________________________________________________  The  GI providers would like to encourage you to use [facility] to communicate with providers for non-urgent requests or questions.  Due to long hold times on the telephone, sending your provider a message by [facility] may be a faster and more efficient way to get a response.  Please allow 48 business hours for a response.  Please remember that this is for non-urgent requests.  _______________________________________________________

## 2022-11-27 NOTE — Telephone Encounter (Signed)
Spoke to pharmacy and clarified they could fill it for 126 tablets.

## 2022-11-27 NOTE — Progress Notes (Signed)
HISTORY OF PRESENT ILLNESS:  Bill Fox is a 82 y.o. male chronic left-sided ulcerative colitis. Last seen in the office August 27, 2022. See that dictation for detail. At that time he was being treated for recent flare of his chronic left-sided colitis.  At that time he seemed to respond nicely to [facility] suppositories in addition to his oral mesalamine.  Since that time he has had worsening of his colitis and scheduled for this appointment.  He reports that it has been difficult for him to retain suppositories at night.  He is having multiple bowel movements during the day and at night.  Small volume.  Intermittently with blood.  There is urgency with occasional incontinence.  No abdominal pain.  Has lost about 8 pounds.  Last colonoscopy January 2022.   REVIEW OF SYSTEMS:  All non-GI ROS negative unless otherwise stated in the HPI except for anxiety, sleeping problems, fatigue  Past Medical History:  Diagnosis Date   Anxiety    At risk for sleep apnea    STOP-BANG= 5     SENT TO PCP 11-29-2013   Cataract    Chronic fatigue    DJD (degenerative joint disease)    RIGHT KNEE   History of colon polyps    2011  &  2013 --  ADENOMATOUS   History of pulmonary embolism    Hyperlipidemia    pt denies ever having high chol   Hypertension    Right knee meniscal tear    Ulcerative colitis     Past Surgical History:  Procedure Laterality Date   CATARACT EXTRACTION W/ INTRAOCULAR LENS IMPLANT Left 2014   COLONOSCOPY W/ POLYPECTOMY  MULTIPLE--  LAST ONE 09-12-2011   KNEE ARTHROSCOPY WITH DRILLING/MICROFRACTURE Right 12/03/2013   Procedure: RIGHT KNEE ARTHROSCOPY PARTIAL MEDIAL MENISCECTOMY DEBRIDEMENT SYNOVECTOMY;  Surgeon: Javier Docker, MD;  Location: [facility];  Service: Orthopedics;  Laterality: Right;   NASAL FRACTURE SURGERY  1985   TRANSTHORACIC ECHOCARDIOGRAM  11-01-2008   MILD LVH/  EF 55%/  GRADE I DIASTOLIC DYSFUNCTION/  MILD LAE    Social History Bill Fox  reports that he quit smoking about 29 years ago. His smoking use included cigarettes. He has a 30.00 pack-year smoking history. He has never used smokeless tobacco. He reports current alcohol use of about 1.0 standard drink of alcohol per week. He reports that he does not use drugs.  family history includes Colon cancer (age of onset: 88) in his maternal uncle; Diabetes in his mother and paternal uncle; Lung cancer in his brother; Stomach cancer in his father.  No Known Allergies     PHYSICAL EXAMINATION: Vital signs: BP 130/66   Pulse 85   Ht 6' (1.829 m)   Wt 232 lb (105.2 kg)   SpO2 93%   BMI 31.46 kg/m   Constitutional: generally well-appearing, no acute distress Psychiatric: alert and oriented x3, cooperative Eyes: extraocular movements intact, anicteric, conjunctiva pink Mouth: oral pharynx moist, no lesions Neck: supple no lymphadenopathy Cardiovascular: heart regular rate and rhythm, no murmur Lungs: clear to auscultation bilaterally Abdomen: soft, nontender, nondistended, no obvious ascites, no peritoneal signs, normal bowel sounds, no organomegaly Rectal: Omitted Extremities: no clubbing or cyanosis.  Trace lower extremity edema bilaterally with stasis changes on the right Skin: no relevant lesions on visible extremities Neuro: No focal deficits.  Renal nerves intact  ASSESSMENT:  1.  Chronic left-sided colitis.  Currently with significant flare.  Last colonoscopy January 2022. 2.  History of anal fissure 3.  Multiple medical problems including history of pulmonary embolism on Eliquis   PLAN:  1.  Initiate prednisone.  Prescribed.  60 mg daily for 2 weeks, then 50 mg daily for 2 weeks, then 40 mg daily for 2 weeks, then 30 mg daily for 2 weeks then 20 mg daily until follow-up.  Medication risks reviewed 2.  Continue mesalamine 3.  Continue Canasa suppositories 4.  Office follow-up in 2 months A total time of 30 minutes was spent preparing to see the  patient, obtaining comprehensive interval history, performing medically appropriate physical exam, counseling and educating the patient regarding the above listed issues, ordering medication, arranging follow-up, and documenting clinical information in the health record

## 2022-11-27 NOTE — Telephone Encounter (Signed)
Inbound call from patient's pharmacy requesting to know if it is okay to fill prednisone prescription for patient for 126 tablets. Please advise, thank you.

## 2023-01-28 DIAGNOSIS — Z23 Encounter for immunization: Secondary | ICD-10-CM | POA: Diagnosis not present

## 2023-01-29 ENCOUNTER — Telehealth: Payer: Self-pay

## 2023-01-29 ENCOUNTER — Encounter: Payer: Self-pay | Admitting: Internal Medicine

## 2023-01-29 ENCOUNTER — Ambulatory Visit (INDEPENDENT_AMBULATORY_CARE_PROVIDER_SITE_OTHER): Payer: Medicare Other | Admitting: Internal Medicine

## 2023-01-29 VITALS — BP 128/74 | HR 82 | Ht 71.0 in | Wt 257.2 lb

## 2023-01-29 DIAGNOSIS — Z7901 Long term (current) use of anticoagulants: Secondary | ICD-10-CM | POA: Diagnosis not present

## 2023-01-29 DIAGNOSIS — K51919 Ulcerative colitis, unspecified with unspecified complications: Secondary | ICD-10-CM | POA: Diagnosis not present

## 2023-01-29 MED ORDER — PLENVU 140 G PO SOLR: 1.0000 | Freq: Once | ORAL | 0 refills | Status: AC

## 2023-01-29 MED ORDER — DIPHENOXYLATE-ATROPINE 2.5-0.025 MG PO TABS: 1.0000 | ORAL_TABLET | Freq: Two times a day (BID) | ORAL | 6 refills | Status: DC | PRN

## 2023-01-29 NOTE — Patient Instructions (Signed)
We have sent the following medications to your pharmacy for you to pick up at your convenience:  Lomotil  You have been scheduled for a colonoscopy. Please follow written instructions given to you at your visit today.   Please pick up your prep supplies at the pharmacy within the next 1-3 days.  If you use inhalers (even only as needed), please bring them with you on the day of your procedure.  DO NOT TAKE 7 DAYS PRIOR TO TEST- Trulicity (dulaglutide) Ozempic, Wegovy (semaglutide) Mounjaro (tirzepatide) Bydureon Bcise (exanatide extended release)  DO NOT TAKE 1 DAY PRIOR TO YOUR TEST Rybelsus (semaglutide) Adlyxin (lixisenatide) Victoza (liraglutide) Byetta (exanatide) ___________________________________________________________________________  _______________________________________________________  If your blood pressure at your visit was 140/90 or greater, please contact your primary care physician to follow up on this.  _______________________________________________________  If you are age 4 or older, your body mass index should be between 23-30. Your Body mass index is 35.87 kg/m. If this is out of the aforementioned range listed, please consider follow up with your Primary Care Provider.  If you are age 71 or younger, your body mass index should be between 19-25. Your Body mass index is 35.87 kg/m. If this is out of the aformentioned range listed, please consider follow up with your Primary Care Provider.   ________________________________________________________  The [place] [facility] providers would like to encourage you to use [facility] to communicate with providers for non-urgent requests or questions.  Due to long hold times on the telephone, sending your provider a message by [facility] may be a faster and more efficient way to get a response.  Please allow 48 business hours for a response.  Please remember that this is for non-urgent requests.   _______________________________________________________

## 2023-01-29 NOTE — Telephone Encounter (Signed)
   Name: Bill Fox  DOB: [birth year]  MRN: [id number]  Primary Cardiologist: Thurmon Fair, MD   Preoperative team, please contact this patient and set up a phone call appointment for further preoperative risk assessment. Please obtain consent and complete medication review. Thank you for your help.  I confirm that guidance regarding antiplatelet and oral anticoagulation therapy has been completed and, if necessary, noted below.   Patient's Eliquis as prescribed by PCP.  Recommendations for holding Eliquis will need to come from prescribing provider.  Ronney Asters, NP 01/29/2023, 10:59 AM [facility]

## 2023-01-29 NOTE — Progress Notes (Signed)
HISTORY OF PRESENT ILLNESS:  Bill Fox is a 82 y.o. male with multiple medical problems as listed below, including a history of pulmonary embolism for which he is on chronic Eliquis therapy.  He is followed regularly in this office for chronic left-sided ulcerative colitis.  He presents today for follow-up.  Patient was last seen in the office November 27, 2022 regarding a significant flare of his colitis.  He was prescribed prednisone taper which started at 60 mg daily to be tapered by 10 mg every 2 weeks.  He just finished his steroids.  About 1 week after starting steroids his bleeding stopped.  His bowel habits improved.  He recently discontinued steroids after completing his taper as directed.  Unfortunately, he has had more issues with intermittent urgent stools which are loose.  In addition, he has gained 30 pounds while on steroids.  No recurrent bleeding.  No abdominal pain.  He inquires about surveillance colonoscopy.  His last colonoscopy was January 2022.  Follow-up in 2 years recommended.  He denies fevers.  He has not tried antidiarrheals.  Blood work from November 27, 2022 showed unremarkable comprehensive metabolic panel.  CBC revealed mild anemia with hemoglobin 12.6.  C-reactive protein was elevated at 16.6  REVIEW OF SYSTEMS:  All non-GI ROS negative unless otherwise stated in the HPI except for anxiety, back pain, fatigue, sleeping problems, skin rash, excessive urination.  Past Medical History:  Diagnosis Date   Anxiety    At risk for sleep apnea    STOP-BANG= 5     SENT TO PCP 11-29-2013   Cataract    Chronic fatigue    DJD (degenerative joint disease)    RIGHT KNEE   History of colon polyps    2011  &  2013 --  ADENOMATOUS   History of pulmonary embolism    Hyperlipidemia    pt denies ever having high chol   Hypertension    Right knee meniscal tear    Ulcerative colitis     Past Surgical History:  Procedure Laterality Date   CATARACT EXTRACTION W/ INTRAOCULAR  LENS IMPLANT Left 2014   COLONOSCOPY W/ POLYPECTOMY  MULTIPLE--  LAST ONE 09-12-2011   KNEE ARTHROSCOPY WITH DRILLING/MICROFRACTURE Right 12/03/2013   Procedure: RIGHT KNEE ARTHROSCOPY PARTIAL MEDIAL MENISCECTOMY DEBRIDEMENT SYNOVECTOMY;  Surgeon: Javier Docker, MD;  Location: [facility];  Service: Orthopedics;  Laterality: Right;   NASAL FRACTURE SURGERY  1985   TRANSTHORACIC ECHOCARDIOGRAM  11-01-2008   MILD LVH/  EF 55%/  GRADE I DIASTOLIC DYSFUNCTION/  MILD LAE    Social History Bill Fox  reports that he quit smoking about 29 years ago. His smoking use included cigarettes. He started smoking about 59 years ago. He has a 30 pack-year smoking history. He has never used smokeless tobacco. He reports current alcohol use of about 1.0 standard drink of alcohol per week. He reports that he does not use drugs.  family history includes Colon cancer (age of onset: 33) in his maternal uncle; Diabetes in his mother and paternal uncle; Lung cancer in his brother; Stomach cancer in his father.  No Known Allergies     PHYSICAL EXAMINATION: Vital signs: BP 128/74   Pulse 82   Ht 5\' 11"  (1.803 m)   Wt 257 lb 3.2 oz (116.7 kg)   SpO2 95%   BMI 35.87 kg/m   Constitutional: generally well-appearing, no acute distress Psychiatric: alert and oriented x3, cooperative Eyes: extraocular movements intact, anicteric, conjunctiva pink  Mouth: oral pharynx moist, no lesions Neck: supple no lymphadenopathy Cardiovascular: heart regular rate and rhythm, no murmur Lungs: clear to auscultation bilaterally Abdomen: soft, obese, nontender, nondistended, no obvious ascites, no peritoneal signs, normal bowel sounds, no organomegaly Rectal: Deferred until colonoscopy Extremities: no clubbing or cyanosis.  1+ lower extremity edema bilaterally Skin: no lesions on visible extremities save stasis changes Neuro: No focal deficits.  Cranial nerves intact  ASSESSMENT:  1.  Chronic left-sided  ulcerative colitis.  Recurrent symptoms after completing steroid taper.  He is also on chronic mesalamine orally.  Cannot tolerate suppositories (unable to retain). 2.  Multiple general medical problems as noted 3.  History of pulmonary embolism on  PLAN:  1.  Colonoscopy with biopsies.  The patient is high risk given his age, comorbidities, and the need to address his chronic anticoagulation.The nature of the procedure, as well as the risks, benefits, and alternatives were carefully and thoroughly reviewed with the patient. Ample time for discussion and questions allowed. The patient understood, was satisfied, and agreed to proceed. 2.  Consider alternative therapies such as Biologics 3.  Would like to hold his anticoagulation 2 days prior to his procedure.  Would dissipate resuming immediately post procedure.  The pros and cons of interrupting anticoagulation were discussed with the patient.  He understood and agreed.  We will check with his PCP. A total time of 40 minutes was spent preparing to see the patient, reviewing interval data, obtaining comprehensive history, performing medically appropriate physical exam, counseling and educating the patient regarding the above listed issues, ordering endoscopic procedure, conferring with his PCP regarding his anticoagulation, and documenting clinical information in the health record

## 2023-01-29 NOTE — Telephone Encounter (Signed)
Pt takes Eliquis for prior VTE, being managed by PCP. Recommend PCP provide clearance recs.

## 2023-01-29 NOTE — Telephone Encounter (Signed)
I left a message to call back to set up tele pre op appt.

## 2023-01-29 NOTE — Telephone Encounter (Signed)
[facility] Pre-operative Risk Assessment     Request for surgical clearance:     Endoscopy Procedure  What type of surgery is being performed?     colonoscopy  When is this surgery scheduled?     02/27/2023  What type of clearance is required ?   Pharmacy  Are there any medications that need to be held prior to surgery and how long? Eliquis 2 days  Practice name and name of physician performing surgery?      [facility]  What is your office phone and fax number?      Phone- [phone]  Fax- [phone]  Anesthesia type (None, local, MAC, general) ?       MAC

## 2023-01-30 NOTE — Telephone Encounter (Signed)
2nd attempt to reach pt, left another message for pt to call back and ask for the preop team.

## 2023-02-03 ENCOUNTER — Telehealth: Payer: Self-pay

## 2023-02-03 NOTE — Telephone Encounter (Signed)
Surgeon office contacted and will try contact patient

## 2023-02-03 NOTE — Telephone Encounter (Signed)
Lvm of third attempt to contact patient cell number doesn't work  and lvm on home phone. Will be removed from pre-op

## 2023-02-03 NOTE — Telephone Encounter (Signed)
Anti coag clearance called - having trouble reaching patient to schedule a phone appt before clearing Eliquis.  Called patient and left message for him to call me back.

## 2023-02-04 NOTE — Telephone Encounter (Signed)
Inbound call from patient stating he had some complications regarding Eliquis clearance. Patient would like to discuss further. Please advise, thank you.

## 2023-02-04 NOTE — Telephone Encounter (Signed)
Spoke to patient and gave him phone number to call to get phone visit set up for Eliquis clearance.

## 2023-02-04 NOTE — Telephone Encounter (Signed)
Inbound call from patient, states he is returning phone call. Patient states he spoke with cardiologist and they advised they would not give clearance until speaking with Dr. Lamar Sprinkles office. Please advise, thank you.

## 2023-02-06 ENCOUNTER — Telehealth: Payer: Self-pay | Admitting: Cardiovascular Disease

## 2023-02-06 NOTE — Telephone Encounter (Signed)
Spoke with Nettie Elm at [facility] who is going to have the nurse call me back to discuss what patient needs to do to get Eliquis clearance

## 2023-02-06 NOTE — Telephone Encounter (Signed)
Up:      Verlon Au is calling to check on the status of the patient's clearance.

## 2023-02-07 NOTE — Telephone Encounter (Signed)
Spoke to [place] with cardiology and cleared up the confusion regarding patient's need for tele appt.  She told me to have patient call her directly at [phone].  Left this information on patient's voice mail and asked he call back and leave a message to let me know he had scheduled the appt.

## 2023-02-07 NOTE — Telephone Encounter (Signed)
Spoke with Verlon Au for Gastroenterology and she states that she will call the patient so that we can get him scheduled for a tele visit to be cleared for pre-op.

## 2023-02-07 NOTE — Telephone Encounter (Signed)
Spoke with Verlon Au for Gastroenterology and she states that she will call the patient so that we can get him scheduled for a tele visit to be cleared for pre-op.  See phone note from 01/29/23.

## 2023-02-10 NOTE — Telephone Encounter (Signed)
[facility] Pre-operative Risk Assessment     Request for surgical clearance:     Endoscopy Procedure  What type of surgery is being performed?     colonoscopy  When is this surgery scheduled?     02/27/2023  What type of clearance is required ?   Pharmacy  Are there any medications that need to be held prior to surgery and how long? Eliquis 2 days  Practice name and name of physician performing surgery?      [facility]  What is your office phone and fax number?      Phone- [phone]  Fax- [phone]  Anesthesia type (None, local, MAC, general) ?       MAC

## 2023-02-13 NOTE — Telephone Encounter (Signed)
Lm on vm with Natasha's info again and asked pt call me to let me know when he talked to her.

## 2023-02-14 NOTE — Telephone Encounter (Signed)
Inbound call from patient, requesting to speak to Verlon Au, states its urgent best number to call is   [phone]

## 2023-02-14 NOTE — Telephone Encounter (Signed)
Spoke with patient and gave him Natasha's number.  He could not get in touch with her so I called her and left a message

## 2023-02-14 NOTE — Telephone Encounter (Signed)
PT returned call to find out when to hold Eliquis. Please advise.

## 2023-02-17 DIAGNOSIS — Z961 Presence of intraocular lens: Secondary | ICD-10-CM | POA: Diagnosis not present

## 2023-02-17 DIAGNOSIS — H16143 Punctate keratitis, bilateral: Secondary | ICD-10-CM | POA: Diagnosis not present

## 2023-02-17 DIAGNOSIS — H01009 Unspecified blepharitis unspecified eye, unspecified eyelid: Secondary | ICD-10-CM | POA: Diagnosis not present

## 2023-02-17 NOTE — Telephone Encounter (Addendum)
Verlon Au, surgery scheduler for Dr. Marina Goodell called our office as to pt needing a tele visit pre op appt. I assured Verlon Au, that I will call the pt and get him scheduled for tele pre op appt. Per Verlon Au, the pt tried calling back to s/w Natasha. I explained that the pt needed to s/w the pre op team and not ask for a specific person as the team members for pre op change daily.   See original clearance request sent in 01/29/23 by GI office.    I will forward back to pre op.

## 2023-02-17 NOTE — Telephone Encounter (Signed)
Spoke to patient and told him I had not realized that his pcp is who manages his Eliquis.  I reached out to Dr. Carmelia Roller and asked if patient could hold his Eliquis for 2 days prior to his procedure.  He messaged me back that this was fine.  Shared this with patient.

## 2023-02-17 NOTE — Telephone Encounter (Signed)
Left message for the pt to call back to set up tele pre op appt 

## 2023-02-17 NOTE — Telephone Encounter (Signed)
Lm on vm regarding Eliquis

## 2023-02-17 NOTE — Telephone Encounter (Signed)
Lm on vm 

## 2023-02-17 NOTE — Telephone Encounter (Signed)
Bill Fox from [facility] is requesting a callback at [phone] in clinic with Dr. Marina Goodell which is the one doing the procedure. She stated if she can't get to the phone due to her being in the room with a pt, she'd like for a good direct number to be left for her to callback. Please advise

## 2023-02-17 NOTE — Telephone Encounter (Signed)
  Bill Fox [facility] 26-Sep-1940 [id number]  Dear Dr. Carmelia Roller:  We have scheduled the above named patient for a(n) endoscopic procedure. Our records show that (s)he is on anticoagulation therapy.  Please advise as to whether the patient may come off their therapy of Eliquis 2 days prior to their procedure which is scheduled for 02/27/2023.  Please route your response to [facility] or fax response to [phone].  Sincerely,    [facility]

## 2023-02-17 NOTE — Telephone Encounter (Signed)
Inbound call from patient returning phone call. Patient requesting a call back. Please advise, thank you.

## 2023-02-17 NOTE — Telephone Encounter (Signed)
   Name: YORK VALLIANT  DOB: [birth year]  MRN: [id number]  Primary Cardiologist: Thurmon Fair, MD   Preoperative team, please contact this patient and set up a phone call appointment for further preoperative risk assessment. Please obtain consent and complete medication review. Thank you for your help.  I confirm that guidance regarding antiplatelet and oral anticoagulation therapy has been completed and, if necessary, noted below.  Pt takes Eliquis for prior VTE, being managed by PCP. Recommend PCP provide clearance recs.   I also confirmed the patient resides in the state of [place]. As per [facility] Medical Board telemedicine laws, the patient must reside in the state in which the provider is licensed.   Napoleon Form, Leodis Rains, NP 02/17/2023, 10:16 AM [facility]

## 2023-02-17 NOTE — Telephone Encounter (Signed)
I received a secure chat message from [place], surgery scheduler dr Carmelia Roller has already gotten back to me and said it was fine to hold his Eliquis.   Me: Do you still need cardiac clearance? or did you just need the clearance for the blood thinner?   Leslie:  no just for the blood thinner so I guess we are all good. what a fiasco. I am so sorry. this is all fault because I missed that his pcp was managing it.    Me: hey if you talk to the pt before I do tell him then to disregard my call about needing televisit.   Leslie: I am going to call him right now!!! thanks for the help!!    I will update all parties involved.

## 2023-02-18 ENCOUNTER — Other Ambulatory Visit: Payer: Self-pay | Admitting: Internal Medicine

## 2023-02-18 ENCOUNTER — Other Ambulatory Visit: Payer: Self-pay | Admitting: Family Medicine

## 2023-02-19 ENCOUNTER — Telehealth: Payer: Self-pay | Admitting: Internal Medicine

## 2023-02-19 NOTE — Telephone Encounter (Signed)
If he has any further bleeding, have him restart his prednisone 40 mg daily until his upcoming colonoscopy. Thanks

## 2023-02-19 NOTE — Telephone Encounter (Signed)
Spoke with pt and he states this am he had a BM and it was brown on the tissue. He did see some blood when he got out of the shower. States it has seems to have stopped now. Pt is not sure where the blood was coming from but he thinks his rectum. Asked him if he he wiped did he just see blood and he stated no. Offered pt an appt with an APP on 10/9 but pt states he is having a colonoscopy on 10/10. Discussed with pt that if he starts having any bleeding from his rectal area when he does not have a BM, develops and SOB, dizziness, extreme fatigue he would need to proceed to the ER. Let him know Dr. Marina Goodell would be notified.

## 2023-02-19 NOTE — Telephone Encounter (Signed)
Pt aware. States he has discovered the blood is not coming from his rectum. Thinks he may have a cut back there somewhere.

## 2023-02-19 NOTE — Telephone Encounter (Signed)
Patient called stated he just took a shower about 15 mins ago and as he was drying he noticed blood coming down his leg. Has no pain at this time but is highly concerned.

## 2023-02-24 ENCOUNTER — Telehealth: Payer: Self-pay | Admitting: Family Medicine

## 2023-02-24 NOTE — Telephone Encounter (Signed)
Called left message to call back 

## 2023-02-24 NOTE — Telephone Encounter (Signed)
Pt said he has a couple more personal questions to ask. He said he thinks he needs to see Dr. Carmelia Roller. Offered to make an appt but patient wants to wait for CMA to call back.

## 2023-02-24 NOTE — Telephone Encounter (Signed)
Pt said he would like a call back from CMA to discuss his Terbinafine prescription. He thought Dr. Carmelia Roller had taken him off of it but it's still on his list. Pt said he is out of the medication. Please call to advise if he should still be taking it and if so,please send in refill.

## 2023-02-24 NOTE — Telephone Encounter (Signed)
If he has not experienced improved nails then I would not continue this.  If he needs a refill because nail fungus is returning, we can send it again.

## 2023-02-24 NOTE — Telephone Encounter (Signed)
Called the patient Feet/ankles/and calves are swollen He would like to know if the swelling could be due to Eliquis. He will stop Eliquis for 2 days prior to his Colonoscopy. He is scheduled this Thursday. The patient is scheduled to see PCP on Monday 03/03/2023 and can discuss questions and concerns.

## 2023-02-24 NOTE — Telephone Encounter (Signed)
Should he still be taking this?

## 2023-02-25 NOTE — Telephone Encounter (Signed)
Spoke to the patient and he said nails are great and no problem

## 2023-02-27 ENCOUNTER — Ambulatory Visit: Payer: Medicare Other | Admitting: Internal Medicine

## 2023-02-27 ENCOUNTER — Encounter: Payer: Self-pay | Admitting: Internal Medicine

## 2023-02-27 VITALS — BP 132/90 | HR 82 | Temp 98.3°F | Resp 15 | Ht 71.0 in | Wt 257.0 lb

## 2023-02-27 DIAGNOSIS — K5150 Left sided colitis without complications: Secondary | ICD-10-CM

## 2023-02-27 DIAGNOSIS — K51919 Ulcerative colitis, unspecified with unspecified complications: Secondary | ICD-10-CM

## 2023-02-27 DIAGNOSIS — K5190 Ulcerative colitis, unspecified, without complications: Secondary | ICD-10-CM | POA: Diagnosis not present

## 2023-02-27 DIAGNOSIS — F419 Anxiety disorder, unspecified: Secondary | ICD-10-CM | POA: Diagnosis not present

## 2023-02-27 DIAGNOSIS — I10 Essential (primary) hypertension: Secondary | ICD-10-CM | POA: Diagnosis not present

## 2023-02-27 MED ORDER — SODIUM CHLORIDE 0.9 % IV SOLN: 500.0000 mL | Freq: Once | INTRAVENOUS | Status: DC

## 2023-02-27 MED ORDER — PREDNISONE 20 MG PO TABS: 40.0000 mg | ORAL_TABLET | Freq: Every day | ORAL | 1 refills | Status: DC

## 2023-02-27 NOTE — Patient Instructions (Addendum)
Handouts Provided:  Ulcerative Colitis  RESUME Eliquis today at prior dose.   RESUME Prednisone 40 mg daily until follow-up with Dr. Marina Goodell.  Medication sent to your pharmacy.  Office will call you to schedule follow up visit in 3- 4 weeks.   YOU HAD AN ENDOSCOPIC PROCEDURE TODAY AT THE [facility]:   Refer to the procedure report that was given to you for any specific questions about what was found during the examination.  If the procedure report does not answer your questions, please call your gastroenterologist to clarify.  If you requested that your care partner not be given the details of your procedure findings, then the procedure report has been included in a sealed envelope for you to review at your convenience later.  YOU SHOULD EXPECT: Some feelings of bloating in the abdomen. Passage of more gas than usual.  Walking can help get rid of the air that was put into your GI tract during the procedure and reduce the bloating. If you had a lower endoscopy (such as a colonoscopy or flexible sigmoidoscopy) you may notice spotting of blood in your stool or on the toilet paper. If you underwent a bowel prep for your procedure, you may not have a normal bowel movement for a few days.  Please Note:  You might notice some irritation and congestion in your nose or some drainage.  This is from the oxygen used during your procedure.  There is no need for concern and it should clear up in a day or so.  SYMPTOMS TO REPORT IMMEDIATELY:  Following lower endoscopy (colonoscopy or flexible sigmoidoscopy):  Excessive amounts of blood in the stool  Significant tenderness or worsening of abdominal pains  Swelling of the abdomen that is new, acute  Fever of 100F or higher  For urgent or emergent issues, a gastroenterologist can be reached at any hour by calling [phone]. Do not use MyChart messaging for urgent concerns.    DIET:  We do recommend a small meal at first, but then you may  proceed to your regular diet.  Drink plenty of fluids but you should avoid alcoholic beverages for 24 hours.  ACTIVITY:  You should plan to take it easy for the rest of today and you should NOT DRIVE or use heavy machinery until tomorrow (because of the sedation medicines used during the test).    FOLLOW UP: Our staff will call the number listed on your records the next business day following your procedure.  We will call around 7:15- 8:00 am to check on you and address any questions or concerns that you may have regarding the information given to you following your procedure. If we do not reach you, we will leave a message.     If any biopsies were taken you will be contacted by phone or by letter within the next 1-3 weeks.  Please call us at [phone] if you have not heard about the biopsies in 3 weeks.    SIGNATURES/CONFIDENTIALITY: You and/or your care partner have signed paperwork which will be entered into your electronic medical record.  These signatures attest to the fact that that the information above on your After Visit Summary has been reviewed and is understood.  Full responsibility of the confidentiality of this discharge information lies with you and/or your care-partner.

## 2023-02-27 NOTE — Progress Notes (Signed)
Sedate, gd SR, tolerated procedure well, VSS, report to RN 

## 2023-02-27 NOTE — Progress Notes (Signed)
Called to room to assist during endoscopic procedure.  Patient ID and intended procedure confirmed with present staff. Received instructions for my participation in the procedure from the performing physician.  

## 2023-02-27 NOTE — Op Note (Signed)
[facility] Patient Name: Bill Fox Procedure Date: 02/27/2023 1:32 PM MRN: [id number] Endoscopist: Wilhemina Bonito. Marina Goodell , MD, [id number] Age: 82 Referring MD:  Date of Birth: [birth year] Gender: Male Account #: [id number] Procedure:                Colonoscopy with biopsies Indications:              High risk colon cancer surveillance: Ulcerative                            left sided colitis of 8 (or more) years duration.                            Last examination January 2022. Now with acute flare                            which is steroid requiring Medicines:                Monitored Anesthesia Care Procedure:                Pre-Anesthesia Assessment:                           - Prior to the procedure, a History and Physical                            was performed, and patient medications and                            allergies were reviewed. The patient's tolerance of                            previous anesthesia was also reviewed. The risks                            and benefits of the procedure and the sedation                            options and risks were discussed with the patient.                            All questions were answered, and informed consent                            was obtained. Prior Anticoagulants: The patient has                            taken Eliquis (apixaban), last dose was 2 days                            prior to procedure. ASA Grade Assessment: III - A                            patient with severe systemic disease. After  reviewing the risks and benefits, the patient was                            deemed in satisfactory condition to undergo the                            procedure.                           After obtaining informed consent, the colonoscope                            was passed under direct vision. Throughout the                            procedure, the patient's blood pressure, pulse,  and                            oxygen saturations were monitored continuously. The                            CF [id number] #[id number] was introduced through the anus                            and advanced to the the cecum, identified by                            appendiceal orifice and ileocecal valve. The                            ileocecal valve, appendiceal orifice, and rectum                            were photographed. The quality of the bowel                            preparation was good. The colonoscopy was performed                            without difficulty. The patient tolerated the                            procedure well. The bowel preparation used was                            Plenvu via split dose instruction. Scope In: 1:37:56 PM Scope Out: 1:51:44 PM Scope Withdrawal Time: 0 hours 9 minutes 39 seconds  Total Procedure Duration: 0 hours 13 minutes 48 seconds  Findings:                 Ulcerated mucosa was present in the rectum to the                            descending colon contiguously. The extent of  disease was to approximately 50 cm and limited to                            the left colon. See images. There was scarring for                            about 10 cm above this region. Beyond that, the                            mucosa was normal. Biopsies were taken with a cold                            forceps for histology from the abnormal descending                            colon as well as the abnormal rectosigmoid colon.                           The exam was otherwise without abnormality on                            direct and retroflexion views. Complications:            No immediate complications. Estimated blood loss:                            None. Estimated Blood Loss:     Estimated blood loss: none. Impression:               1. Moderately severe active left-sided ulcerative                            colitis                            2. Otherwise unremarkable colonoscopy. Recommendation:           - Repeat colonoscopy (date not yet determined) for                            surveillance.                           - Resume Eliquis (apixaban) today at prior dose.                           - Patient has a contact number available for                            emergencies. The signs and symptoms of potential                            delayed complications were discussed with the                            patient. Return to normal  activities tomorrow.                            Written discharge instructions were provided to the                            patient.                           - Resume previous diet.                           - Continue present medications.                           - Await pathology results.                           - RESUME PREDNISONE 40 mg daily until follow-up                            with Dr. Marina Goodell                           - Office follow-up with Dr. Marina Goodell in 3 to 4 weeks Wilhemina Bonito. Marina Goodell, MD 02/27/2023 2:01:13 PM This report has been signed electronically.

## 2023-02-27 NOTE — Progress Notes (Signed)
Expand All Collapse All HISTORY OF PRESENT ILLNESS:   Bill Fox is a 82 y.o. male with multiple medical problems as listed below, including a history of pulmonary embolism for which he is on chronic Eliquis therapy.  He is followed regularly in this office for chronic left-sided ulcerative colitis.  He presents today for follow-up.   Patient was last seen in the office November 27, 2022 regarding a significant flare of his colitis.  He was prescribed prednisone taper which started at 60 mg daily to be tapered by 10 mg every 2 weeks.  He just finished his steroids.  About 1 week after starting steroids his bleeding stopped.  His bowel habits improved.  He recently discontinued steroids after completing his taper as directed.  Unfortunately, he has had more issues with intermittent urgent stools which are loose.  In addition, he has gained 30 pounds while on steroids.  No recurrent bleeding.  No abdominal pain.  He inquires about surveillance colonoscopy.  His last colonoscopy was January 2022.  Follow-up in 2 years recommended.  He denies fevers.  He has not tried antidiarrheals.   Blood work from November 27, 2022 showed unremarkable comprehensive metabolic panel.  CBC revealed mild anemia with hemoglobin 12.6.  C-reactive protein was elevated at 16.6   REVIEW OF SYSTEMS:   All non-GI ROS negative unless otherwise stated in the HPI except for anxiety, back pain, fatigue, sleeping problems, skin rash, excessive urination.       Past Medical History:  Diagnosis Date   Anxiety     At risk for sleep apnea      STOP-BANG= 5     SENT TO PCP 11-29-2013   Cataract     Chronic fatigue     DJD (degenerative joint disease)      RIGHT KNEE   History of colon polyps      2011  &  2013 --  ADENOMATOUS   History of pulmonary embolism     Hyperlipidemia      pt denies ever having high chol   Hypertension     Right knee meniscal tear     Ulcerative colitis                 Past Surgical History:   Procedure Laterality Date   CATARACT EXTRACTION W/ INTRAOCULAR LENS IMPLANT Left 2014   COLONOSCOPY W/ POLYPECTOMY   MULTIPLE--  LAST ONE 09-12-2011   KNEE ARTHROSCOPY WITH DRILLING/MICROFRACTURE Right 12/03/2013    Procedure: RIGHT KNEE ARTHROSCOPY PARTIAL MEDIAL MENISCECTOMY DEBRIDEMENT SYNOVECTOMY;  Surgeon: Javier Docker, MD;  Location: [facility];  Service: Orthopedics;  Laterality: Right;   NASAL FRACTURE SURGERY   1985   TRANSTHORACIC ECHOCARDIOGRAM   11-01-2008    MILD LVH/  EF 55%/  GRADE I DIASTOLIC DYSFUNCTION/  MILD LAE          Social History Bill Fox  reports that he quit smoking about 29 years ago. His smoking use included cigarettes. He started smoking about 59 years ago. He has a 30 pack-year smoking history. He has never used smokeless tobacco. He reports current alcohol use of about 1.0 standard drink of alcohol per week. He reports that he does not use drugs.   family history includes Colon cancer (age of onset: 79) in his maternal uncle; Diabetes in his mother and paternal uncle; Lung cancer in his brother; Stomach cancer in his father.   Allergies  No Known Allergies  PHYSICAL EXAMINATION: Vital signs: BP 128/74   Pulse 82   Ht 5\' 11"  (1.803 m)   Wt 257 lb 3.2 oz (116.7 kg)   SpO2 95%   BMI 35.87 kg/m   Constitutional: generally well-appearing, no acute distress Psychiatric: alert and oriented x3, cooperative Eyes: extraocular movements intact, anicteric, conjunctiva pink Mouth: oral pharynx moist, no lesions Neck: supple no lymphadenopathy Cardiovascular: heart regular rate and rhythm, no murmur Lungs: clear to auscultation bilaterally Abdomen: soft, obese, nontender, nondistended, no obvious ascites, no peritoneal signs, normal bowel sounds, no organomegaly Rectal: Deferred until colonoscopy Extremities: no clubbing or cyanosis.  1+ lower extremity edema bilaterally Skin: no lesions on visible extremities save stasis  changes Neuro: No focal deficits.  Cranial nerves intact   ASSESSMENT:   1.  Chronic left-sided ulcerative colitis.  Recurrent symptoms after completing steroid taper.  He is also on chronic mesalamine orally.  Cannot tolerate suppositories (unable to retain). 2.  Multiple general medical problems as noted 3.  History of pulmonary embolism on   PLAN:   1.  Colonoscopy with biopsies.  The patient is high risk given his age, comorbidities, and the need to address his chronic anticoagulation.The nature of the procedure, as well as the risks, benefits, and alternatives were carefully and thoroughly reviewed with the patient. Ample time for discussion and questions allowed. The patient understood, was satisfied, and agreed to proceed. 2.  Consider alternative therapies such as Biologics 3.  Would like to hold his anticoagulation 2 days prior to his procedure.  Would dissipate resuming immediately post procedure.  The pros and cons of interrupting anticoagulation were discussed with the patient.  He understood and agreed.  We will check with his PCP.

## 2023-02-28 ENCOUNTER — Telehealth: Payer: Self-pay | Admitting: *Deleted

## 2023-02-28 NOTE — Telephone Encounter (Signed)
  Follow up Call-     02/27/2023    1:10 PM  Call back number  Post procedure Call Back phone  # [phone]  Permission to leave phone message Yes     Patient questions:  Do you have a fever, pain , or abdominal swelling? No. Pain Score  0 *  Have you tolerated food without any problems? Yes.    Have you been able to return to your normal activities? Yes.    Do you have any questions about your discharge instructions: Diet   No. Medications  No. Follow up visit  No.  Do you have questions or concerns about your Care? No.  Actions: * If pain score is 4 or above: No action needed, pain <4.

## 2023-02-28 NOTE — Telephone Encounter (Signed)
Spoke with pt and questions answered. Pt asked about Prednisone. Instructed him of where the prescription was sent. Also informed pt of f/u appointment date and time with Dr. Marina Goodell. No other questions at the end of the call.

## 2023-02-28 NOTE — Telephone Encounter (Signed)
Patient called requesting to speak with nusure regarding the procedure he had yesterday. Please advise.

## 2023-03-03 ENCOUNTER — Ambulatory Visit: Payer: Medicare Other | Admitting: Family Medicine

## 2023-03-03 MED ORDER — DIPHENOXYLATE-ATROPINE 2.5-0.025 MG PO TABS: 2.0000 | ORAL_TABLET | Freq: Three times a day (TID) | ORAL | 0 refills | Status: DC

## 2023-03-03 NOTE — Telephone Encounter (Signed)
Inbound call from patient states has not slept in 24 hours. Due to him experiencing abdominal pain.  Requesting to speak with a nurse.

## 2023-03-03 NOTE — Telephone Encounter (Signed)
See additional phone note.

## 2023-03-03 NOTE — Telephone Encounter (Signed)
Left message for pt to call back. 10/14

## 2023-03-03 NOTE — Telephone Encounter (Signed)
Inbound call from patient, states he is experiencing incontinence and fecal urgency. Would like to discuss with a nurse urgently.

## 2023-03-03 NOTE — Telephone Encounter (Signed)
Pt aware and will start the cream.  He states the prednisone and the lomotil are not helping with his diarrhea. He wants to know should he increase the lomotil or is there anything else he can take?

## 2023-03-03 NOTE — Addendum Note (Signed)
Addended by: Selinda Michaels R on: 03/03/2023 11:30 AM   Modules accepted: Orders

## 2023-03-03 NOTE — Telephone Encounter (Signed)
1.  He can increase Lomotil to 2 tablets 3 times daily 2.  Prednisone was just started.  May take a few days to kick in

## 2023-03-03 NOTE — Telephone Encounter (Signed)
Spoke with pt and he is aware. New script called in for pt.

## 2023-03-03 NOTE — Telephone Encounter (Signed)
Spoke with pt and he states he started taking the prednisone 40mg  daily at breakfast on Friday. He has been taking Lomotil twice a day. States he had watery diarrhea stools every 2 hours, that he has not slept at all since last night and that his bottom is really sore. Pt wants to know what Dr. Marina Goodell recommends. Please advise.

## 2023-03-04 ENCOUNTER — Encounter: Payer: Self-pay | Admitting: Internal Medicine

## 2023-03-04 LAB — SURGICAL PATHOLOGY

## 2023-03-09 DIAGNOSIS — K5190 Ulcerative colitis, unspecified, without complications: Secondary | ICD-10-CM | POA: Diagnosis not present

## 2023-03-09 DIAGNOSIS — K591 Functional diarrhea: Secondary | ICD-10-CM | POA: Diagnosis not present

## 2023-03-10 ENCOUNTER — Ambulatory Visit (INDEPENDENT_AMBULATORY_CARE_PROVIDER_SITE_OTHER): Payer: Medicare Other | Admitting: Family Medicine

## 2023-03-10 ENCOUNTER — Encounter: Payer: Self-pay | Admitting: Family Medicine

## 2023-03-10 VITALS — BP 128/80 | HR 78 | Temp 98.5°F | Ht 66.0 in | Wt 256.2 lb

## 2023-03-10 DIAGNOSIS — T50905A Adverse effect of unspecified drugs, medicaments and biological substances, initial encounter: Secondary | ICD-10-CM

## 2023-03-10 DIAGNOSIS — M7989 Other specified soft tissue disorders: Secondary | ICD-10-CM | POA: Diagnosis not present

## 2023-03-10 MED ORDER — APIXABAN 5 MG PO TABS: 5.0000 mg | ORAL_TABLET | Freq: Two times a day (BID) | ORAL | Status: DC

## 2023-03-10 MED ORDER — FUROSEMIDE 40 MG PO TABS
40.0000 mg | ORAL_TABLET | Freq: Two times a day (BID) | ORAL | 3 refills | Status: DC
Start: 2023-03-10 — End: 2023-07-11

## 2023-03-10 MED ORDER — POTASSIUM CHLORIDE ER 10 MEQ PO TBCR: EXTENDED_RELEASE_TABLET | ORAL | 2 refills | Status: DC

## 2023-03-10 NOTE — Progress Notes (Signed)
Chief Complaint  Patient presents with   discuss eliquis    Subjective: Patient is a 82 y.o. male here for f/u.  Patient has a history of ulcerative colitis.  He recently had colonoscopy with GI team and was placed on prednisone 40 mg daily again.  Historically this has made him very swollen and to gain weight. He was on Lasix in the past while on prednisone. He is not wearing compression stockings and not exercising routinely.   Past Medical History:  Diagnosis Date   Anxiety    At risk for sleep apnea    STOP-BANG= 5     SENT TO PCP 11-29-2013   Cataract    Chronic fatigue    DJD (degenerative joint disease)    RIGHT KNEE   History of colon polyps    2011  &  2013 --  ADENOMATOUS   History of pulmonary embolism    Hyperlipidemia    pt denies ever having high chol   Hypertension    Right knee meniscal tear    Ulcerative colitis     Objective: BP 128/80 (BP Location: Right Arm, Patient Position: Sitting, Cuff Size: Large)   Pulse 78   Temp 98.5 F (36.9 C) (Oral)   Ht 5\' 6"  (1.676 m)   Wt 256 lb 4 oz (116.2 kg)   SpO2 94%   BMI 41.36 kg/m  General: Awake, appears stated age Heart: RRR, 2+ pitting b/l LE edema tapering at the prox 1/3 of tibia. Lungs: CTAB, no rales, wheezes or rhonchi. No accessory muscle use Psych: Age appropriate judgment and insight, normal affect and mood  Assessment and Plan: Adverse effect of drug, initial encounter  Leg swelling - Plan: Basic metabolic panel, furosemide (LASIX) 40 MG tablet, potassium chloride (KLOR-CON 10) 10 MEQ tablet  Adverse effect of medication. Unable to stop from primary care end as his main issue is GI related. Will have him consider compression stockings, elevate legs, mind salt intake and exercise routinely. Add back Lasix 40 mg bid and Klor con 20 mEq with every dose of Lasix. F/u for BMP in 1 week, recheck with [place] in 1 month.  The patient voiced understanding and agreement to the plan.  Jilda Roche [place],  DO 03/10/23  12:00 PM

## 2023-03-10 NOTE — Patient Instructions (Addendum)
For the swelling in your lower extremities, be sure to elevate your legs when able, mind the salt intake, stay physically active and consider wearing compression stockings.  We are starting the water pill again just as long as you on the prednisone.   Let us know if you need anything.

## 2023-03-11 ENCOUNTER — Ambulatory Visit: Payer: Medicare Other | Admitting: Internal Medicine

## 2023-03-11 NOTE — Telephone Encounter (Signed)
Pt states he continues to have what he describes as stools that look like light brown gravy with meat in it. At times he only passes small amounts but he is up and down all night having stools. He also reports when he has the stools his bottom burns and hurts terribly. He states he was seen at urgent care and was given a steroid injection for his bottom. He is still taking prednisone 40mg  in the am and 2 lomotil tid. States he is not getting much sleep as he is up and down all night. Please advise.

## 2023-03-11 NOTE — Telephone Encounter (Signed)
Spoke with pt and he is aware of recommendations per Dr. Marina Goodell.

## 2023-03-11 NOTE — Telephone Encounter (Signed)
1.  He should pick up Desitin cream and apply liberally to the area of irritation and burning 2.  Add Citrucel 2 tablespoons daily.  This might improve his stool consistency 3.  Continue prednisone and Lomotil without change

## 2023-03-11 NOTE — Telephone Encounter (Signed)
Inbound call from patient, is requesting to speak with a nurse in regards to messages below. Patient states his symptoms have not improved is requesting to speak with a nurse.

## 2023-03-17 ENCOUNTER — Other Ambulatory Visit (INDEPENDENT_AMBULATORY_CARE_PROVIDER_SITE_OTHER): Payer: Medicare Other

## 2023-03-17 DIAGNOSIS — M7989 Other specified soft tissue disorders: Secondary | ICD-10-CM

## 2023-03-17 LAB — BASIC METABOLIC PANEL
BUN: 23 mg/dL (ref 6–23)
CO2: 25 meq/L (ref 19–32)
Calcium: 8.8 mg/dL (ref 8.4–10.5)
Chloride: 105 meq/L (ref 96–112)
Creatinine, Ser: 0.94 mg/dL (ref 0.40–1.50)
GFR: 76.84 mL/min (ref >60.00)
Glucose, Bld: 69 mg/dL — ABNORMAL LOW (ref 70–99)
Potassium: 3.7 meq/L (ref 3.5–5.1)
Sodium: 141 meq/L (ref 135–145)

## 2023-03-18 ENCOUNTER — Ambulatory Visit (INDEPENDENT_AMBULATORY_CARE_PROVIDER_SITE_OTHER): Payer: Medicare Other | Admitting: Internal Medicine

## 2023-03-18 ENCOUNTER — Other Ambulatory Visit: Payer: Medicare Other

## 2023-03-18 ENCOUNTER — Encounter: Payer: Self-pay | Admitting: Internal Medicine

## 2023-03-18 VITALS — BP 130/80 | HR 88 | Ht 72.0 in | Wt 260.0 lb

## 2023-03-18 DIAGNOSIS — K51919 Ulcerative colitis, unspecified with unspecified complications: Secondary | ICD-10-CM

## 2023-03-18 MED ORDER — PREDNISONE 20 MG PO TABS: ORAL_TABLET | ORAL | 0 refills | Status: DC

## 2023-03-18 NOTE — Patient Instructions (Signed)
Your provider has requested that you go to the basement level for lab work before leaving today. Press "B" on the elevator. The lab is located at the first door on the left as you exit the elevator.  We have sent the following medications to your pharmacy for you to pick up at your convenience:  Prednisone  Please follow up on 05/01/2024 at 10:40am.  _______________________________________________________  If your blood pressure at your visit was 140/90 or greater, please contact your primary care physician to follow up on this.  _______________________________________________________  If you are age 18 or older, your body mass index should be between 23-30. Your Body mass index is 35.26 kg/m. If this is out of the aforementioned range listed, please consider follow up with your Primary Care Provider.  If you are age 23 or younger, your body mass index should be between 19-25. Your Body mass index is 35.26 kg/m. If this is out of the aformentioned range listed, please consider follow up with your Primary Care Provider.   ________________________________________________________  The [facility] providers would like to encourage you to use [facility] to communicate with providers for non-urgent requests or questions.  Due to long hold times on the telephone, sending your provider a message by [facility] may be a faster and more efficient way to get a response.  Please allow 48 business hours for a response.  Please remember that this is for non-urgent requests.  _______________________________________________________

## 2023-03-18 NOTE — Progress Notes (Signed)
HISTORY OF PRESENT ILLNESS:  Bill Fox is a 82 y.o. male with multiple medical problems as listed below including a history of pulmonary embolism for which she is on chronic Eliquis.  He is followed in this office for left-sided ulcerative colitis.  He was last seen in the office January 29, 2023.  At that time he developed recurrent colitis symptoms after steroid taper.  We discussed alternative therapies such as Biologics.  He was scheduled for colonoscopy with biopsies which was performed February 27, 2023.  He was found to have moderately severe active left-sided ulcerative colitis.  Biopsies revealed chronic active colitis.  No other abnormalities.  He was continued on prednisone 40 mg daily and asked to follow-up at this time.  Patient tells me that he is buttock region was quite sore.  We recommended Desitin.  This helped.  He also tells me that over the past few days he has had significant improvement in his colitis symptoms.  He reports formed bowel movements that are brown the past 2 days.  No abdominal pain.  No bleeding.  As in the past, he has gained weight on prednisone.  REVIEW OF SYSTEMS:  All non-GI ROS negative except for anxiety  Past Medical History:  Diagnosis Date   Anxiety    At risk for sleep apnea    STOP-BANG= 5     SENT TO PCP 11-29-2013   Cataract    Chronic fatigue    DJD (degenerative joint disease)    RIGHT KNEE   History of colon polyps    2011  &  2013 --  ADENOMATOUS   History of pulmonary embolism    Hyperlipidemia    pt denies ever having high chol   Hypertension    Right knee meniscal tear    Ulcerative colitis     Past Surgical History:  Procedure Laterality Date   CATARACT EXTRACTION W/ INTRAOCULAR LENS IMPLANT Left 2014   COLONOSCOPY W/ POLYPECTOMY  MULTIPLE--  LAST ONE 09-12-2011   KNEE ARTHROSCOPY WITH DRILLING/MICROFRACTURE Right 12/03/2013   Procedure: RIGHT KNEE ARTHROSCOPY PARTIAL MEDIAL MENISCECTOMY DEBRIDEMENT SYNOVECTOMY;   Surgeon: Javier Docker, MD;  Location: [facility];  Service: Orthopedics;  Laterality: Right;   NASAL FRACTURE SURGERY  1985   TRANSTHORACIC ECHOCARDIOGRAM  11-01-2008   MILD LVH/  EF 55%/  GRADE I DIASTOLIC DYSFUNCTION/  MILD LAE    Social History Bill Fox  reports that he quit smoking about 29 years ago. His smoking use included cigarettes. He started smoking about 59 years ago. He has a 30 pack-year smoking history. He has never used smokeless tobacco. He reports current alcohol use of about 1.0 standard drink of alcohol per week. He reports that he does not use drugs.  family history includes Colon cancer (age of onset: 38) in his maternal uncle; Diabetes in his mother and paternal uncle; Lung cancer in his brother; Stomach cancer in his father.  No Known Allergies     PHYSICAL EXAMINATION: Vital signs: BP 130/80   Pulse 88   Ht 6' (1.829 m)   Wt 260 lb (117.9 kg)   BMI 35.26 kg/m   Constitutional: generally well-appearing, no acute distress Psychiatric: alert and oriented x3, cooperative Eyes: extraocular movements intact, anicteric, conjunctiva pink Mouth: oral pharynx moist, no lesions Neck: supple no lymphadenopathy Cardiovascular: heart regular rate and rhythm, no murmur Lungs: clear to auscultation bilaterally Abdomen: soft, obese, nontender, nondistended, no obvious ascites, no peritoneal signs, normal bowel sounds, no  organomegaly Rectal: Omitted Extremities: no clubbing or cyanosis.  Trace lower extremity edema bilaterally Skin: no lesions on visible extremities Neuro: No focal deficits.  Cranial nerves intact  ASSESSMENT:  1.  Left-sided ulcerative colitis.  Recent flare.  Now responding to steroids.  Colonoscopy 3 weeks ago as described   PLAN:  1.  Continue prednisone 40 mg daily for 2 weeks, then 30 mg daily for 2 weeks, then 20 mg daily until follow-up 2.  Continue Desitin to buttock as needed 3.  Check QuantiFERON gold and  hepatitis A and B serologies in anticipation of possible biologic therapy.  Would consider Remicade. 4.  Patient provided with information on Biologics 5.  Office follow-up in about 6 weeks  Total time of 30 minutes was spent preparing to see the patient, obtaining interval history, performing medically appropriate physical exam, counseling and educating the patient regarding the above listed issues, reviewing his recent colonoscopy and pathology, directing medical therapy, arranging follow-up, and documenting clinical information in the health record

## 2023-03-19 LAB — HEPATITIS A ANTIBODY, TOTAL: Hepatitis A AB,Total: REACTIVE — AB

## 2023-03-19 LAB — HEPATITIS B SURFACE ANTIBODY,QUALITATIVE: Hep B S Ab: NONREACTIVE

## 2023-03-20 LAB — QUANTIFERON-TB GOLD PLUS
QuantiFERON Mitogen Value: >10 [IU]/mL
QuantiFERON Nil Value: 0.03 [IU]/mL
QuantiFERON TB1 Ag Value: 0.02 [IU]/mL
QuantiFERON TB2 Ag Value: 0.01 [IU]/mL
QuantiFERON-TB Gold Plus: NEGATIVE

## 2023-03-26 ENCOUNTER — Encounter: Payer: Self-pay | Admitting: Podiatry

## 2023-03-26 ENCOUNTER — Ambulatory Visit (INDEPENDENT_AMBULATORY_CARE_PROVIDER_SITE_OTHER): Payer: Medicare Other

## 2023-03-26 ENCOUNTER — Ambulatory Visit (INDEPENDENT_AMBULATORY_CARE_PROVIDER_SITE_OTHER): Payer: Medicare Other | Admitting: Podiatry

## 2023-03-26 DIAGNOSIS — M722 Plantar fascial fibromatosis: Secondary | ICD-10-CM

## 2023-03-26 DIAGNOSIS — L02619 Cutaneous abscess of unspecified foot: Secondary | ICD-10-CM

## 2023-03-26 DIAGNOSIS — L03119 Cellulitis of unspecified part of limb: Secondary | ICD-10-CM | POA: Diagnosis not present

## 2023-03-26 MED ORDER — DOXYCYCLINE HYCLATE 100 MG PO TABS: 100.0000 mg | ORAL_TABLET | Freq: Two times a day (BID) | ORAL | 1 refills | Status: DC

## 2023-03-26 NOTE — Progress Notes (Signed)
Subjective:   Patient ID: Bill Fox, male   DOB: 82 y.o.   MRN: [id number]   HPI Patient presents concerned because he is developed a lot of pain in the bottom of his right heel going on a couple weeks off and on it is sore when walking on it and has not had current treatment.  Patient states that this is been painful at times and he does not remember stepping on anything   ROS      Objective:  Physical Exam  Neurovascular status intact General Hygiene is fair with patient having quite a bit of dirt on the plantar aspect of both feet with an area plantar right which appears to be irritative no edema erythema or drainage surrounding the area     Assessment:  Possible for foreign body or other pathology that may have occurred with poor hygiene of his feet H&P     Plan:  The precautionary x-ray taken [facility] sterile debridement did not note active drainage but there was some irritation of the tissue.  No subcutaneous bone or tendon exposure advised on soaks keeping his feet clean and using Neosporin.  As precautionary measure placed on doxycycline twice daily gave strict instructions to call us if any issues were to occur  X-rays indicate no signs of foreign body did indicate plantar spur no other pathology or lysis noted

## 2023-04-23 ENCOUNTER — Telehealth: Payer: Self-pay | Admitting: Internal Medicine

## 2023-04-23 NOTE — Telephone Encounter (Signed)
Pt states he is still having issues making it to the bathroom. He has an accident at least daily. Reports the stool is not diarrhea it is soft with some pieces. He is no longer on prednisone but states he could not really tell a difference when he was taking the prednisone. Please advise.

## 2023-04-23 NOTE — Telephone Encounter (Signed)
Inbound call from patient, states he is not seeing any improvement after his procedure, would like to speak to someone in regards.

## 2023-04-23 NOTE — Telephone Encounter (Signed)
Resume prednisone 40 mg daily. Imodium 1-2 po tid prn Add metamucil 2 tbsp daily Call him in one week with update Also, please check TPMT metabolizer study, Quntiferon- gold, and chronic hep A and B serologies (both in anticipation of possible biologic therapies) OV follow up with me in 4-5 weeks

## 2023-04-24 ENCOUNTER — Other Ambulatory Visit: Payer: Self-pay

## 2023-04-24 DIAGNOSIS — R197 Diarrhea, unspecified: Secondary | ICD-10-CM

## 2023-04-24 DIAGNOSIS — K51919 Ulcerative colitis, unspecified with unspecified complications: Secondary | ICD-10-CM

## 2023-04-24 MED ORDER — PREDNISONE 20 MG PO TABS: 40.0000 mg | ORAL_TABLET | Freq: Every day | ORAL | 1 refills | Status: DC

## 2023-04-24 NOTE — Telephone Encounter (Signed)
Spoke with pt and he is aware of recommendations per Dr. Marina Goodell. Pt scheduled to see Dr. Marina Goodell 06/02/23 at 3:40pm. Script sent to pharmacy. Lab orders in epic and he knows to come for labs prior to the appt.

## 2023-04-27 ENCOUNTER — Other Ambulatory Visit: Payer: Self-pay | Admitting: Internal Medicine

## 2023-05-02 ENCOUNTER — Ambulatory Visit: Payer: Medicare Other | Admitting: Internal Medicine

## 2023-05-23 ENCOUNTER — Ambulatory Visit: Payer: Self-pay | Admitting: Family Medicine

## 2023-05-23 DIAGNOSIS — M549 Dorsalgia, unspecified: Secondary | ICD-10-CM | POA: Diagnosis not present

## 2023-05-23 NOTE — Telephone Encounter (Signed)
 Pt going to [place]

## 2023-05-23 NOTE — Telephone Encounter (Signed)
 Chief Complaint: Severe back pain Symptoms: Severe back pain in middle lower back Frequency: constant Patient denies: painful urination, abdominal pain Disposition: [x] ED /[] Urgent Care (no appt availability in office) / [] Appointment(In office/virtual)/ []  [facility]/ [] Home Care/ [] Refused Recommended Disposition /[] [facility]/ []  Follow-up with PCP Additional Notes: Patient called in stating he is having severe back pain, rating pain 9/10, in middle lower back. Patient denies known cause for back pain and states it is inhibiting his mobility. Advised patient to visit nearest ED for evaluation due to severity of pain and protocol.    Copied from CRM [id number]. Topic: Clinical - Red Word Triage >> May 23, 2023 12:19 PM Antonio H wrote: Red Word that prompted transfer to Nurse Triage: Severe back pain, starting this morning, can barely walk, able to move but is moving pretty slow Reason for Disposition  [1] SEVERE back pain (e.g., excruciating) AND [2] sudden onset AND [3] age > 60 years  Answer Assessment - Initial Assessment Questions 1. ONSET: When did the pain begin?      Started this morning 2. LOCATION: Where does it hurt? (upper, mid or lower back)     Base/bottom  3. SEVERITY: How bad is the pain?  (e.g., Scale 1-10; mild, moderate, or severe)   - MILD (1-3): Doesn't interfere with normal activities.    - MODERATE (4-7): Interferes with normal activities or awakens from sleep.    - SEVERE (8-10): Excruciating pain, unable to do any normal activities.      9/10 4. PATTERN: Is the pain constant? (e.g., yes, no; constant, intermittent)      Constant, throbbing and shootng 5. RADIATION: Does the pain shoot into your legs or somewhere else?     No 6. CAUSE:  What do you think is causing the back pain?      No 7. BACK OVERUSE:  Any recent lifting of heavy objects, strenuous work or exercise?     No 8. MEDICINES: What have you taken so far for the  pain? (e.g., nothing, acetaminophen , NSAIDS)     No 9. NEUROLOGIC SYMPTOMS: Do you have any weakness, numbness, or problems with bowel/bladder control?     No, can't walk very fast 10. OTHER SYMPTOMS: Do you have any other symptoms? (e.g., fever, abdomen pain, burning with urination, blood in urine)       No  Protocols used: Back Pain-A-AH

## 2023-05-23 NOTE — Telephone Encounter (Signed)
 Patient called back regarding disposition, due to length of drive to the ER, he opted to go the nearest UC instead. Educated patient, if Emergency services were still required, the UC would still send him as well. Patient verbalized understanding.

## 2023-06-01 ENCOUNTER — Telehealth: Payer: Self-pay | Admitting: Gastroenterology

## 2023-06-01 NOTE — Telephone Encounter (Signed)
 Agree. Stay on medication and keep appoint. Thanks

## 2023-06-01 NOTE — Telephone Encounter (Signed)
 Patient calling in today with questions about prescriptions.  He was last seen in December by Dr. Abran and appears he was put on prednisone  40 Mg and Lomotil .  He was wondering if he needs to continue these medications.  He feels they are helping his symptoms.  Discussed that he has an appointment scheduled already tomorrow with Dr. Abran at 340.  Patient was unaware of appointment.  Recommended he keep his appointment and arrive 15 minutes prior to appointment time.  He verbalizes understanding and states he will be at the appointment tomorrow in which Dr. Abran can further discuss medications with him at that time.

## 2023-06-02 ENCOUNTER — Encounter: Payer: Self-pay | Admitting: Internal Medicine

## 2023-06-02 ENCOUNTER — Ambulatory Visit: Payer: Medicare Other | Admitting: Internal Medicine

## 2023-06-02 VITALS — BP 140/80 | HR 107 | Ht 72.0 in | Wt 265.0 lb

## 2023-06-02 DIAGNOSIS — K5150 Left sided colitis without complications: Secondary | ICD-10-CM

## 2023-06-02 DIAGNOSIS — K51919 Ulcerative colitis, unspecified with unspecified complications: Secondary | ICD-10-CM

## 2023-06-02 MED ORDER — PREDNISONE 10 MG PO TABS: ORAL_TABLET | ORAL | 0 refills | Status: DC

## 2023-06-02 MED ORDER — DIPHENOXYLATE-ATROPINE 2.5-0.025 MG PO TABS: 2.0000 | ORAL_TABLET | Freq: Three times a day (TID) | ORAL | 3 refills | Status: DC

## 2023-06-02 NOTE — Patient Instructions (Addendum)
 Continue Prednisone  taper for 8 weeks.   Lomotil  refilled.   Follow up in 3 months.   _______________________________________________________  If your blood pressure at your visit was 140/90 or greater, please contact your primary care physician to follow up on this.  _______________________________________________________  If you are age 81 or older, your body mass index should be between 23-30. Your Body mass index is 35.94 kg/m. If this is out of the aforementioned range listed, please consider follow up with your Primary Care Provider.  If you are age 66 or younger, your body mass index should be between 19-25. Your Body mass index is 35.94 kg/m. If this is out of the aformentioned range listed, please consider follow up with your Primary Care Provider.   ________________________________________________________  The [facility] providers would like to encourage you to use MYCHART to communicate with providers for non-urgent requests or questions.  Due to long hold times on the telephone, sending your provider a message by [facility] may be a faster and more efficient way to get a response.  Please allow 48 business hours for a response.  Please remember that this is for non-urgent requests.  _______________________________________________________

## 2023-06-02 NOTE — Progress Notes (Signed)
 HISTORY OF PRESENT ILLNESS:  Bill Fox is a 83 y.o. male with multiple medical problems including a history of pulmonary embolism for which he is on chronic Eliquis .  He is followed in this office for left-sided ulcerative colitis.  He was last seen in the office March 18, 2023.  His steroids were tapered at that time after a UC flare.  He subsequently contacted the office complaining of recurrent flare.  He was having problems with buttock pain.  He was placed back on prednisone  40 mg daily.  He was also placed on Lomotil  2 p.o. 3 times daily.  He presents for follow-up at this time.  The patient please report that he is as good as he has been in the past 2 years.  Over the past week he has had formed bowel movements.  No bleeding.  No abdominal pain.  No incontinence.  No further issues with perirectal pain.  He has gained 5 pounds since his last visit  REVIEW OF SYSTEMS:  All non-GI ROS negative except for  Past Medical History:  Diagnosis Date   Anxiety    At risk for sleep apnea    STOP-BANG= 5     SENT TO PCP 11-29-2013   Cataract    Chronic fatigue    DJD (degenerative joint disease)    RIGHT KNEE   History of colon polyps    2011  &  2013 --  ADENOMATOUS   History of pulmonary embolism    Hyperlipidemia    pt denies ever having high chol   Hypertension    Right knee meniscal tear    Ulcerative colitis     Past Surgical History:  Procedure Laterality Date   CATARACT EXTRACTION W/ INTRAOCULAR LENS IMPLANT Left 2014   COLONOSCOPY W/ POLYPECTOMY  MULTIPLE--  LAST ONE 09-12-2011   KNEE ARTHROSCOPY WITH DRILLING/MICROFRACTURE Right 12/03/2013   Procedure: RIGHT KNEE ARTHROSCOPY PARTIAL MEDIAL MENISCECTOMY DEBRIDEMENT SYNOVECTOMY;  Surgeon: Reyes JAYSON Billing, MD;  Location: [facility];  Service: Orthopedics;  Laterality: Right;   NASAL FRACTURE SURGERY  1985   TRANSTHORACIC ECHOCARDIOGRAM  11-01-2008   MILD LVH/  EF 55%/  GRADE I DIASTOLIC DYSFUNCTION/   MILD LAE    Social History Bill Fox  reports that he quit smoking about 30 years ago. His smoking use included cigarettes. He started smoking about 60 years ago. He has a 30 pack-year smoking history. He has never used smokeless tobacco. He reports current alcohol use of about 1.0 standard drink of alcohol per week. He reports that he does not use drugs.  family history includes Colon cancer (age of onset: 84) in his maternal uncle; Diabetes in his mother and paternal uncle; Lung cancer in his brother; Stomach cancer in his father.  No Known Allergies     PHYSICAL EXAMINATION: Vital signs: BP (!) 140/80   Pulse (!) 107   Ht 6' (1.829 m)   Wt 265 lb (120.2 kg)   BMI 35.94 kg/m   Constitutional: generally well-appearing for age, no acute distress Psychiatric: alert and oriented x3, cooperative Eyes: extraocular movements intact, anicteric, conjunctiva pink Mouth: oral pharynx moist, no lesions Neck: supple no lymphadenopathy Cardiovascular: heart regular rate and rhythm, no murmur Lungs: clear to auscultation bilaterally Abdomen: soft, obese, nontender, nondistended, no obvious ascites, no peritoneal signs, normal bowel sounds, no organomegaly Rectal: Omitted Extremities: no clubbing or cyanosis.  Trace lower extremity edema bilaterally Skin: no lesions on visible extremities Neuro: No focal deficits.  Cranial nerves intact  ASSESSMENT:  1.  Chronic left-sided ulcerative colitis.  Flare 2.  Multiple medical problems   PLAN:  1.  Prednisone  taper.  Currently on 40 mg daily.  Decrease by 10 mg daily every 2 weeks 2.  Continue Lomotil  2 p.o. 3 times daily.  Decrease if constipation develops 3.  Refill Lomotil .  Medication side effects and risks reviewed 4.  Routine office follow-up 3 months

## 2023-06-18 ENCOUNTER — Other Ambulatory Visit: Payer: Self-pay | Admitting: Internal Medicine

## 2023-06-28 ENCOUNTER — Other Ambulatory Visit: Payer: Self-pay | Admitting: Internal Medicine

## 2023-07-02 ENCOUNTER — Ambulatory Visit: Payer: Self-pay | Admitting: Family Medicine

## 2023-07-02 NOTE — Telephone Encounter (Signed)
  Chief Complaint: Bruising Symptoms: Bruising, tenderness/burning Frequency: Off and on Pertinent Negatives: Patient denies Dizziness, weakness, tachycardia, SOB, chest pain, injury Disposition: [] ED /[] Urgent Care (no appt availability in office) / [x] Appointment(In office/virtual)/ []  [facility]/ [] Home Care/ [] Refused Recommended Disposition /[] [facility]/ []  Follow-up with PCP Additional Notes: Patient called with complaints of bruising/discoloration under both arms. Patient states he is on Eliquis but has never had any problems before with bleeding. Patient states skin discoloration was first noticed two days ago and has not gotten bigger, however is, "Just along under both arms that whole area," in size. Patient states areas sometimes mildly burn/is mildly tender if he moves a certain way or touches the area, but denies injury, lightheadedness, nose bleeds, tachycardia, weakness, SOB, or chest pain. Patient advised by this RN to be seen within 24hours to which patient was agreeable. Appt scheduled. Patient advised by this RN to call back with worsening symptoms. Patient verbalized understanding   Copied from CRM [id number]. Topic: Clinical - Red Word Triage >> Jul 02, 2023  3:05 PM [place] A wrote: [facility] that prompted transfer to Nurse Triage: Patient called said there is discoloration under both arms that goes across his chest. Patient does have pain Reason for Disposition  Taking Coumadin (warfarin) or other strong blood thinner, or known bleeding disorder (e.g., thrombocytopenia)  (Exception: Very small, painless bruise at heparin injection site.)  Answer Assessment - Initial Assessment Questions 1. APPEARANCE of BRUISE: "Describe the bruise."      *No Answer* 2. SIZE: "How large is the bruise?"      *No Answer* 3. NUMBER: "How many bruises are there?"      *No Answer* 4. LOCATION: "Where is the bruise located?"      *No Answer* 5. ONSET: "How long ago did  the bruise occur?"      *No Answer* 6. CAUSE: "Tell me how it happened."     *No Answer* 7. MEDICAL HISTORY: "Do you have any medical problems that can cause easy bruising or bleeding?" (e.g., leukemia, liver disease, recent chemotherapy)     *No Answer* 8. MEDICINES: "Do you take any medications which thin the blood such as: aspirin, heparin, ibuprofen (NSAIDS), Plavix, or Coumadin?"     *No Answer* 9. OTHER SYMPTOMS: "Do you have any other symptoms?"  (e.g., weakness, dizziness, pain, fever, nosebleed, blood in urine/stool)     *No Answer* 10. PREGNANCY: "Is there any chance you are pregnant?" "When was your last menstrual period?"       *No Answer*  Protocols used: [employer]

## 2023-07-02 NOTE — Progress Notes (Unsigned)
      Established patient visit   Patient: Bill Fox   DOB: [birth year]   83 y.o. Male  MRN: [id number] Visit Date: 07/03/2023  Today's healthcare provider: Alfredia Ferguson, PA-C   No chief complaint on file.  Subjective     ***  Medications: Outpatient Medications Prior to Visit  Medication Sig   apixaban (ELIQUIS) 5 MG TABS tablet Take 1 tablet (5 mg total) by mouth 2 (two) times daily.   diphenoxylate-atropine (LOMOTIL) 2.5-0.025 MG tablet Take 1 tablet by mouth 2 (two) times daily as needed for diarrhea or loose stools.   diphenoxylate-atropine (LOMOTIL) 2.5-0.025 MG tablet Take 2 tablets by mouth 3 (three) times daily.   doxazosin (CARDURA) 8 MG tablet TAKE ONE-HALF (1/2) TABLET AT BEDTIME   doxycycline (VIBRA-TABS) 100 MG tablet Take 1 tablet (100 mg total) by mouth 2 (two) times daily.   furosemide (LASIX) 40 MG tablet Take 1 tablet (40 mg total) by mouth 2 (two) times daily.   HYDROcodone-acetaminophen (NORCO/VICODIN) 5-325 MG tablet Take 1 tablet by mouth every 6 (six) hours as needed.   methocarbamol (ROBAXIN) 500 MG tablet Take 500-1,000 mg by mouth every 6 (six) hours as needed.   mirtazapine (REMERON) 30 MG tablet TAKE 1 TABLET AT BEDTIME   potassium chloride (KLOR-CON 10) 10 MEQ tablet Take 2 tabs with every Lasix dose.   predniSONE (DELTASONE) 10 MG tablet Take 4 tablets (40 mg total) by mouth daily with breakfast for 14 days, THEN 3 tablets (30 mg total) daily with breakfast for 14 days, THEN 2 tablets (20 mg total) daily with breakfast for 14 days, THEN 1 tablet (10 mg total) daily with breakfast for 14 days.   predniSONE (DELTASONE) 20 MG tablet TAKE 1 TABLET BY MOUTH DAILY WITH BREAKFAST   rosuvastatin (CRESTOR) 10 MG tablet Take 1 tablet (10 mg total) by mouth daily.   No facility-administered medications prior to visit.    Review of Systems {Insert previous labs (optional):23779} {See past labs  Heme  Chem  Endocrine  Serology  Results Review  (optional):1}   Objective    There were no vitals taken for this visit. {Insert last BP/Wt (optional):23777}{See vitals history (optional):1}  Physical Exam  ***  No results found for any visits on 07/03/23.  Assessment & Plan    There are no diagnoses linked to this encounter.  ***  No follow-ups on file.       Alfredia Ferguson, PA-C  [facility] at [facility] [phone] (phone) [phone] (fax)  [facility]

## 2023-07-03 ENCOUNTER — Other Ambulatory Visit: Payer: Self-pay | Admitting: Cardiovascular Disease

## 2023-07-03 ENCOUNTER — Other Ambulatory Visit: Payer: Self-pay | Admitting: Family Medicine

## 2023-07-03 ENCOUNTER — Ambulatory Visit (INDEPENDENT_AMBULATORY_CARE_PROVIDER_SITE_OTHER): Payer: Medicare Other | Admitting: Physician Assistant

## 2023-07-03 ENCOUNTER — Encounter: Payer: Self-pay | Admitting: Physician Assistant

## 2023-07-03 VITALS — BP 151/75 | HR 94 | Ht 72.0 in | Wt 275.0 lb

## 2023-07-03 DIAGNOSIS — L304 Erythema intertrigo: Secondary | ICD-10-CM

## 2023-07-03 MED ORDER — CLOTRIMAZOLE 1 % EX CREA: 1.0000 | TOPICAL_CREAM | Freq: Two times a day (BID) | CUTANEOUS | 0 refills | Status: DC

## 2023-07-07 ENCOUNTER — Telehealth: Payer: Self-pay | Admitting: Family Medicine

## 2023-07-07 ENCOUNTER — Other Ambulatory Visit: Payer: Self-pay | Admitting: Family Medicine

## 2023-07-07 MED ORDER — APIXABAN 5 MG PO TABS: 5.0000 mg | ORAL_TABLET | Freq: Two times a day (BID) | ORAL | Status: DC

## 2023-07-07 NOTE — Telephone Encounter (Signed)
Called pt was advised refill for apixaban (ELIQUIS) 5 MG was sent.

## 2023-07-07 NOTE — Telephone Encounter (Signed)
Copied from CRM [id number]. Topic: Clinical - Prescription Issue >> Jul 07, 2023  4:09 PM Dennison Nancy wrote: Reason for CRM: Patient checking on the status of the refill for the apixaban (ELIQUIS) 5 MG TABS tablet Patient has 1 tablet left , patient requesting to get refill today   [id number] - [place], [place] - [address] [place] [address] Phone: [phone] Fax: [phone] Hours: Not open 24 hours

## 2023-07-07 NOTE — Telephone Encounter (Signed)
Copied from CRM [id number]. Topic: Clinical - Medication Refill >> Jul 07, 2023  9:59 AM [place] J wrote: Most Recent Primary Care Visit:  Provider: Alfredia Ferguson  Department: [facility]  Visit Type: ACUTE  Date: 07/03/2023  Medication: apixaban (ELIQUIS) 5 MG TABS tablet  Has the patient contacted their pharmacy? Yes (Agent: If no, request that the patient contact the pharmacy for the refill. If patient does not wish to contact the pharmacy document the reason why and proceed with request.) (Agent: If yes, when and what did the pharmacy advise?)  Is this the correct pharmacy for this prescription? Yes If no, delete pharmacy and type the correct one.  This is the patient's preferred pharmacy:  [facility] - [place], [place] - [address] [place] [address] Phone: [phone] Fax: [phone]  [id number] - [place], [place] - [address] [place] [address] Phone: [phone] Fax: [phone]   Has the prescription been filled recently? No  Is the patient out of the medication? Yes  Has the patient been seen for an appointment in the last year OR does the patient have an upcoming appointment? Yes  Can we respond through MyChart? No  Agent: Please be advised that Rx refills may take up to 3 business days. We ask that you follow-up with your pharmacy.

## 2023-07-08 MED ORDER — APIXABAN 5 MG PO TABS: 5.0000 mg | ORAL_TABLET | Freq: Two times a day (BID) | ORAL | 2 refills | Status: DC

## 2023-07-08 NOTE — Addendum Note (Signed)
Addended by: Radene Gunning on: 07/08/2023 07:45 AM   Modules accepted: Orders

## 2023-07-10 ENCOUNTER — Ambulatory Visit: Payer: Self-pay | Admitting: Family Medicine

## 2023-07-10 NOTE — Telephone Encounter (Signed)
Chief Complaint: Edema Symptoms: SOB Frequency: Ongoing Pertinent Negatives: Patient denies pain, fever Disposition: [] ED /[] Urgent Care (no appt availability in office) / [x] Appointment(In office/virtual)/ []  [facility]/ [] Home Care/ [] Refused Recommended Disposition /[] [facility]/ []  Follow-up with PCP Additional Notes: Pt states he has had swelling in his ankles, feet, legs for a couple of days. Pt states he found his potassium chloride and lasix medications today. Pt has not been taking medications but just started taking them today. Pt states he has had SOB since his last OV with Dr. Carmelia Roller. Pt scheduled for an OV tomorrow. This RN educated pt on home care, new-worsening symptoms, when to call back/seek emergent care. Pt verbalized understanding and agrees to plan.   Copied from CRM [id number]. Topic: Clinical - Red Word Triage >> Jul 10, 2023  9:40 AM Denese Killings wrote: Red Word that prompted transfer to Nurse Triage: Patient feet, legs, and ankles are swollen really bad. Reason for Disposition  MILD or MODERATE ankle swelling (e.g., can't move joint normally, can't do usual activities) (Exceptions: Itchy, localized swelling; swelling is chronic.)  Answer Assessment - Initial Assessment Questions 1. LOCATION: "Which ankle is swollen?" "Where is the swelling?"     Ankles, feet, and legs 2. ONSET: "When did the swelling start?"     A couple days ago 3. SWELLING: "How bad is the swelling?" Or, "How large is it?" (e.g., mild, moderate, severe; size of localized swelling)    - NONE: No joint swelling.   - LOCALIZED: Localized; small area of puffy or swollen skin (e.g., insect bite, skin irritation).   - MILD: Joint looks or feels mildly swollen or puffy.   - MODERATE: Swollen; interferes with normal activities (e.g., work or school); decreased range of movement; may be limping.   - SEVERE: Very swollen; can't move swollen joint at all; limping a lot or unable to  walk.     Moderate 4. PAIN: "Is there any pain?" If Yes, ask: "How bad is it?" (Scale 1-10; or mild, moderate, severe)   - NONE (0): no pain.   - MILD (1-3): doesn't interfere with normal activities.    - MODERATE (4-7): interferes with normal activities (e.g., work or school) or awakens from sleep, limping.    - SEVERE (8-10): excruciating pain, unable to do any normal activities, unable to walk.      Denies 5. OTHER SYMPTOMS: "Do you have any other symptoms?" (e.g., fever, chest pain, difficulty breathing, calf pain)     Difficulty breathing  Protocols used: Ankle Swelling-A-AH

## 2023-07-10 NOTE — Telephone Encounter (Signed)
   Chief Complaint: leg swelling Symptoms: swelling  Frequency: constant   Disposition: [] ED /[] Urgent Care (no appt availability in office) / [] Appointment(In office/virtual)/ []  [facility]/ [x] Home Care/ [] Refused Recommended Disposition /[] [facility]/ []  Follow-up with PCP Additional Notes: Pt calling with leg swelling. Pt is extremely hard of hearing. Pt is home alone and has no one to help.  Pt not able to answer questions appropriately. Pt stated "I found some medication I should be taking but haven't. It's Lasix and potassium chloride. Should I take those?" Pt kept repeating "I can't make out what you're saying." PT stated he gets winded while grocery shopping or walking long distances. RN advised pt to take medication as ordered and to prop feet up. PT was able to repeat instructions and stated he would take medication now. RN advised he call back today at 1600 and let us reassess to see it appt needs to be made. Pt verbalized understanding.               Copied from CRM [id number]. Topic: Clinical - Red Word Triage >> Jul 10, 2023  9:40 AM Denese Killings wrote: Red Word that prompted transfer to Nurse Triage: Patient feet, legs, and ankles are swollen really bad. Reason for Disposition  [1] Localized swelling (e.g., small area of puffy or swollen skin) AND [2] itchy  Answer Assessment - Initial Assessment Questions 1. ONSET: "When did the swelling start?" (e.g., minutes, hours, days)     Couple days ago  2. LOCATION: "What part of the leg is swollen?"  "Are both legs swollen or just one leg?"     Not sure  3. SEVERITY: "How bad is the swelling?" (e.g., localized; mild, moderate, severe)   - Localized: Small area of swelling localized to one leg.   - MILD pedal edema: Swelling limited to foot and ankle, pitting edema < 1/4 inch (6 mm) deep, rest and elevation eliminate most or all swelling.   - MODERATE edema: Swelling of lower leg to knee, pitting edema  > 1/4 inch (6 mm) deep, rest and elevation only partially reduce swelling.   - SEVERE edema: Swelling extends above knee, facial or hand swelling present.      Not sure  4. REDNESS: "Does the swelling look red or infected?"     No  5. PAIN: "Is the swelling painful to touch?" If Yes, ask: "How painful is it?"   (Scale 1-10; mild, moderate or severe)     no 6. FEVER: "Do you have a fever?" If Yes, ask: "What is it, how was it measured, and when did it start?"      no 7. CAUSE: "What do you think is causing the leg swelling?"     Not taking lasix  8. MEDICAL HISTORY: "Do you have a history of blood clots (e.g., DVT), cancer, heart failure, kidney disease, or liver failure?"     Unsure  9. RECURRENT SYMPTOM: "Have you had leg swelling before?" If Yes, ask: "When was the last time?" "What happened that time?"     Yes, been a while  10. OTHER SYMPTOMS: "Do you have any other symptoms?" (e.g., chest pain, difficulty breathing)       SOB while walking/grocery shopping  Protocols used: Leg Swelling and Edema-A-AH

## 2023-07-11 ENCOUNTER — Ambulatory Visit (HOSPITAL_BASED_OUTPATIENT_CLINIC_OR_DEPARTMENT_OTHER)
Admission: RE | Admit: 2023-07-11 | Discharge: 2023-07-11 | Disposition: A | Discharge status: Home / Self Care | Payer: Medicare Other | Source: Ambulatory Visit | Attending: Family Medicine | Admitting: Family Medicine

## 2023-07-11 ENCOUNTER — Ambulatory Visit: Payer: Medicare Other | Admitting: Family Medicine

## 2023-07-11 ENCOUNTER — Encounter: Payer: Self-pay | Admitting: Family Medicine

## 2023-07-11 VITALS — BP 134/68 | HR 84 | Temp 98.0°F | Resp 16 | Ht 73.0 in | Wt 266.8 lb

## 2023-07-11 DIAGNOSIS — R0609 Other forms of dyspnea: Secondary | ICD-10-CM | POA: Insufficient documentation

## 2023-07-11 DIAGNOSIS — R0989 Other specified symptoms and signs involving the circulatory and respiratory systems: Secondary | ICD-10-CM | POA: Diagnosis not present

## 2023-07-11 DIAGNOSIS — M7989 Other specified soft tissue disorders: Secondary | ICD-10-CM | POA: Diagnosis not present

## 2023-07-11 MED ORDER — FUROSEMIDE 40 MG PO TABS: 40.0000 mg | ORAL_TABLET | Freq: Two times a day (BID) | ORAL | Status: DC

## 2023-07-11 NOTE — Progress Notes (Signed)
Chief Complaint  Patient presents with   Leg Swelling    Leg Swelling and feet    Bill Fox here for bilateral leg swelling.  Duration: 4 days Hx of prolonged bedrest, recent surgery, travel or injury? No Pain the calf? No SOB? Yes Personal or family history of clot or bleeding disorder? Yes; compliant with Eliquis Hx of heart failure, renal failure, hepatic failure? No  Past Medical History:  Diagnosis Date   Anxiety    At risk for sleep apnea    STOP-BANG= 5     SENT TO PCP 11-29-2013   Cataract    Chronic fatigue    DJD (degenerative joint disease)    RIGHT KNEE   History of colon polyps    2011  &  2013 --  ADENOMATOUS   History of pulmonary embolism    Hyperlipidemia    pt denies ever having high chol   Hypertension    Right knee meniscal tear    Ulcerative colitis    Family History  Problem Relation Age of Onset   Diabetes Mother    Stomach cancer Father    Lung cancer Brother    Diabetes Paternal Uncle        x 2   Colon cancer Maternal Uncle 47   Pancreatic cancer Neg Hx    Esophageal cancer Neg Hx    Colon polyps Neg Hx    Past Surgical History:  Procedure Laterality Date   CATARACT EXTRACTION W/ INTRAOCULAR LENS IMPLANT Left 2014   COLONOSCOPY W/ POLYPECTOMY  MULTIPLE--  LAST ONE 09-12-2011   KNEE ARTHROSCOPY WITH DRILLING/MICROFRACTURE Right 12/03/2013   Procedure: RIGHT KNEE ARTHROSCOPY PARTIAL MEDIAL MENISCECTOMY DEBRIDEMENT SYNOVECTOMY;  Surgeon: Javier Docker, MD;  Location: [facility];  Service: Orthopedics;  Laterality: Right;   NASAL FRACTURE SURGERY  1985   TRANSTHORACIC ECHOCARDIOGRAM  11-01-2008   MILD LVH/  EF 55%/  GRADE I DIASTOLIC DYSFUNCTION/  MILD LAE    Current Outpatient Medications:    apixaban (ELIQUIS) 5 MG TABS tablet, Take 1 tablet (5 mg total) by mouth 2 (two) times daily., Disp: 180 tablet, Rfl: 2   clotrimazole (LOTRIMIN) 1 % cream, Apply 1 Application topically 2 (two) times daily. Until rash is  resolved, Disp: 42 g, Rfl: 0   diphenoxylate-atropine (LOMOTIL) 2.5-0.025 MG tablet, Take 1 tablet by mouth 2 (two) times daily as needed for diarrhea or loose stools., Disp: 60 tablet, Rfl: 6   diphenoxylate-atropine (LOMOTIL) 2.5-0.025 MG tablet, Take 2 tablets by mouth 3 (three) times daily., Disp: 180 tablet, Rfl: 3   doxazosin (CARDURA) 8 MG tablet, TAKE ONE-HALF (1/2) TABLET AT BEDTIME, Disp: 45 tablet, Rfl: 3   HYDROcodone-acetaminophen (NORCO/VICODIN) 5-325 MG tablet, Take 1 tablet by mouth every 6 (six) hours as needed., Disp: , Rfl:    methocarbamol (ROBAXIN) 500 MG tablet, Take 500-1,000 mg by mouth every 6 (six) hours as needed., Disp: , Rfl:    mirtazapine (REMERON) 30 MG tablet, TAKE 1 TABLET AT BEDTIME, Disp: 90 tablet, Rfl: 3   potassium chloride (KLOR-CON 10) 10 MEQ tablet, Take 2 tabs with every Lasix dose., Disp: 120 tablet, Rfl: 2   rosuvastatin (CRESTOR) 10 MG tablet, Take 1 tablet (10 mg total) by mouth daily. Pt must schedule appt with provider for further refills - 1st attempt, Disp: 30 tablet, Rfl: 0   furosemide (LASIX) 40 MG tablet, Take 1 tablet (40 mg total) by mouth 2 (two) times daily., Disp: , Rfl:  BP 134/68 (BP Location: Left Arm, Patient Position: Sitting)   Pulse 84   Temp 98 F (36.7 C) (Oral)   Resp 16   Ht 6\' 1"  (1.854 m)   Wt 266 lb 12.8 oz (121 kg)   SpO2 95%   BMI 35.20 kg/m  Gen- awake, alert, appears stated age Heart- RRR, no murmurs, 2+ pitting LE edema b/l tapering at the knees Lungs- CTAB, normal effort w/o accessory muscle use MSK- no ttp over calves b/l Psych: Age appropriate judgment and insight  DOE (dyspnea on exertion) - Plan: DG Chest 2 View  Leg swelling - Plan: furosemide (LASIX) 40 MG tablet, Basic metabolic panel  Ck CXR. If fluid, will have him get another echo. Go back on Lasix 40 mg bid with associated K supp. Counseled on exercise and elevating legs. BMP in 1 week. F/u prn. Pt voiced understanding and agreement to the  plan.  Jilda Roche [place], DO 07/11/23  10:28 AM

## 2023-07-11 NOTE — Patient Instructions (Addendum)
We will be in touch with your X-ray results. Depending on the results, we may get another scan of your heart.   Please stay on the Lasix and subsequent potassium.   Keep the diet clean and stay active.  Let us know if you need anything.

## 2023-08-05 ENCOUNTER — Other Ambulatory Visit: Payer: Self-pay | Admitting: Internal Medicine

## 2023-08-12 ENCOUNTER — Ambulatory Visit (INDEPENDENT_AMBULATORY_CARE_PROVIDER_SITE_OTHER): Admitting: Family Medicine

## 2023-08-12 ENCOUNTER — Encounter: Payer: Self-pay | Admitting: Family Medicine

## 2023-08-12 ENCOUNTER — Telehealth: Payer: Self-pay | Admitting: Family Medicine

## 2023-08-12 VITALS — BP 132/76 | HR 88 | Temp 98.4°F | Ht 73.0 in | Wt 254.2 lb

## 2023-08-12 DIAGNOSIS — R0609 Other forms of dyspnea: Secondary | ICD-10-CM | POA: Diagnosis not present

## 2023-08-12 DIAGNOSIS — M7989 Other specified soft tissue disorders: Secondary | ICD-10-CM

## 2023-08-12 DIAGNOSIS — H1033 Unspecified acute conjunctivitis, bilateral: Secondary | ICD-10-CM

## 2023-08-12 DIAGNOSIS — E785 Hyperlipidemia, unspecified: Secondary | ICD-10-CM | POA: Diagnosis not present

## 2023-08-12 MED ORDER — POLYMYXIN B-TRIMETHOPRIM 10000-0.1 UNIT/ML-% OP SOLN: 1.0000 [drp] | OPHTHALMIC | 0 refills | Status: DC

## 2023-08-12 MED ORDER — POTASSIUM CHLORIDE ER 10 MEQ PO TBCR: EXTENDED_RELEASE_TABLET | ORAL | 2 refills | Status: DC

## 2023-08-12 MED ORDER — APIXABAN 5 MG PO TABS: 5.0000 mg | ORAL_TABLET | Freq: Two times a day (BID) | ORAL | 3 refills | Status: DC

## 2023-08-12 MED ORDER — MIRTAZAPINE 30 MG PO TABS: 30.0000 mg | ORAL_TABLET | Freq: Every day | ORAL | 3 refills | Status: DC

## 2023-08-12 MED ORDER — FUROSEMIDE 40 MG PO TABS: 40.0000 mg | ORAL_TABLET | Freq: Two times a day (BID) | ORAL | 2 refills | Status: DC

## 2023-08-12 MED ORDER — ROSUVASTATIN CALCIUM 10 MG PO TABS: 10.0000 mg | ORAL_TABLET | Freq: Every day | ORAL | 3 refills | Status: DC

## 2023-08-12 NOTE — Patient Instructions (Addendum)
 Artificial tears like Refresh and Systane may be used for comfort. OK to get generic version. Generally people use them every 2-4 hours, but you can use them as much as you want because there is no medication in it.  Use warm compresses over the eyes.  Continue the Pataday.   Give [place] 2-3 business days to get the results of your labs back.   The meds have all been sent to the [facility] mail order pharmacy.   Let us know if you need anything.

## 2023-08-12 NOTE — Telephone Encounter (Signed)
 Copied from CRM [id number]. Topic: Clinical - Medication Refill >> Aug 12, 2023 10:03 AM Bo Mcclintock wrote: Most Recent Primary Care Visit:  Provider: Sharlene Dory  Department: [facility]  Visit Type: ACUTE  Date: 07/11/2023  Medication: rosuvastatin (CRESTOR) 10 MG tablet  Has the patient contacted their pharmacy? No (Agent: If no, request that the patient contact the pharmacy for the refill. If patient does not wish to contact the pharmacy document the reason why and proceed with request.) (Agent: If yes, when and what did the pharmacy advise?) Pt is requesting all medications be moved to preferred pharmacy on file  Is this the correct pharmacy for this prescription? Yes If no, delete pharmacy and type the correct one.  This is the patient's preferred pharmacy:  [facility] - [place], [place] - [address] [place] [address] Phone: [phone] Fax: [phone]   Has the prescription been filled recently? No  Is the patient out of the medication? Yes  Has the patient been seen for an appointment in the last year OR does the patient have an upcoming appointment? Yes  Can we respond through MyChart? No  Agent: Please be advised that Rx refills may take up to 3 business days. We ask that you follow-up with your pharmacy.

## 2023-08-12 NOTE — Progress Notes (Signed)
 Chief Complaint  Patient presents with   Acute Visit    Patient presents today for medication review    Bill Fox is here for bilateral eye irritation.  Duration: 3 days Chemical exposure? No  Recent URI? No  Contact lenses? No  History of allergies? No  Treatment to date: Pataday drops No vision changes.  LE edema Hx of LE edema initially thought to be related to his prednisone use for UC from the GI team. He is currently on Lasix 40 mg bid along with K. Compliant, no AE's, seems to be working well.   Hx of PE on chronic AC. On Eliquis 5 mg bid. Compliant, no AEs. No SOB. Medicine is pricey, requesting transfer to the mail order pharmacy.   Dyslipidemia Patient presents for dyslipidemia follow up. Currently being treated with Crestor 10 mg/d and compliance with treatment thus far has been good until he ran out a few days ago. He denies myalgias. He is sometimes adhering to a healthy diet. Exercise: limited No CP or SOB.  The patient is not known to have coexisting coronary artery disease.  Past Medical History:  Diagnosis Date   Anxiety    At risk for sleep apnea    STOP-BANG= 5     SENT TO PCP 11-29-2013   Cataract    Chronic fatigue    DJD (degenerative joint disease)    RIGHT KNEE   History of colon polyps    2011  &  2013 --  ADENOMATOUS   History of pulmonary embolism    Hyperlipidemia    pt denies ever having high chol   Hypertension    Right knee meniscal tear    Ulcerative colitis    Family History  Problem Relation Age of Onset   Diabetes Mother    Stomach cancer Father    Lung cancer Brother    Diabetes Paternal Uncle        x 2   Colon cancer Maternal Uncle 47   Pancreatic cancer Neg Hx    Esophageal cancer Neg Hx    Colon polyps Neg Hx     BP 132/76   Pulse 88   Temp 98.4 F (36.9 C)   Ht 6\' 1"  (1.854 m)   Wt 254 lb 3.2 oz (115.3 kg)   SpO2 96%   BMI 33.54 kg/m  Gen: Awake, alert, appears stated age Eyes: Lids injected, Sclera  injected, PERRLA, EOMi Nose: Nares patent without discharge Heart: RRR, 1+ pitting LE edema tapering at mid tibia b/l Lungs: CTAB. No accessory muscle use.  Mouth: MMM, pharynx without erythema or exudate Psych: Age appropriate judgment and insight; mood and affect normal  Acute conjunctivitis of both eyes, unspecified acute conjunctivitis type - Plan: trimethoprim-polymyxin b (POLYTRIM) ophthalmic solution  Leg swelling - Plan: furosemide (LASIX) 40 MG tablet, potassium chloride (KLOR-CON 10) 10 MEQ tablet  DOE (dyspnea on exertion) - Plan: CBC, Comprehensive metabolic panel  Dyslipidemia  Cont Pataday drops. Polytrim as above. Doesn't wear contacts. Instructed to practice good hand hygiene and try not to touch face. Warm compresses and artificial tears also recommended. F/u if no improvement in 7-10 days. Chronic, stable. Cont Lasix 40 mg bid, K with it. Ck labs. If neg will get updated Echo.  Chronic, stable. Cont Crestor 10 mg/d.  Pt voiced understanding and agreement to the plan.  [name], DO 08/12/23 3:27 PM

## 2023-08-13 ENCOUNTER — Ambulatory Visit (INDEPENDENT_AMBULATORY_CARE_PROVIDER_SITE_OTHER)

## 2023-08-13 ENCOUNTER — Other Ambulatory Visit: Payer: Self-pay

## 2023-08-13 DIAGNOSIS — D649 Anemia, unspecified: Secondary | ICD-10-CM | POA: Diagnosis not present

## 2023-08-13 LAB — COMPREHENSIVE METABOLIC PANEL
ALT: 10 U/L (ref 0–53)
AST: 18 U/L (ref 0–37)
Albumin: 4.2 g/dL (ref 3.5–5.2)
Alkaline Phosphatase: 59 U/L (ref 39–117)
BUN: 15 mg/dL (ref 6–23)
CO2: 24 meq/L (ref 19–32)
Calcium: 9.1 mg/dL (ref 8.4–10.5)
Chloride: 102 meq/L (ref 96–112)
Creatinine, Ser: 1.65 mg/dL — ABNORMAL HIGH (ref 0.40–1.50)
GFR: 39.01 mL/min — ABNORMAL LOW (ref >60.00)
Glucose, Bld: 107 mg/dL — ABNORMAL HIGH (ref 70–99)
Potassium: 3.9 meq/L (ref 3.5–5.1)
Sodium: 138 meq/L (ref 135–145)
Total Bilirubin: 0.5 mg/dL (ref 0.2–1.2)
Total Protein: 7.8 g/dL (ref 6.0–8.3)

## 2023-08-13 LAB — CBC
HCT: 39.3 % (ref 39.0–52.0)
Hemoglobin: 12.6 g/dL — ABNORMAL LOW (ref 13.0–17.0)
MCHC: 32 g/dL (ref 30.0–36.0)
MCV: 80.8 fl (ref 78.0–100.0)
Platelets: 269 10*3/uL (ref 150.0–400.0)
RBC: 4.87 Mil/uL (ref 4.22–5.81)
RDW: 17.7 % — ABNORMAL HIGH (ref 11.5–15.5)
WBC: 7.9 10*3/uL (ref 4.0–10.5)

## 2023-08-14 LAB — IBC + FERRITIN
Ferritin: 19 ng/mL — ABNORMAL LOW (ref 22.0–322.0)
Iron: 35 ug/dL — ABNORMAL LOW (ref 42–165)
Saturation Ratios: 8.4 % — ABNORMAL LOW (ref 20.0–50.0)
TIBC: 417.2 ug/dL (ref 250.0–450.0)
Transferrin: 298 mg/dL (ref 212.0–360.0)

## 2023-08-15 MED ORDER — ROSUVASTATIN CALCIUM 10 MG PO TABS: 10.0000 mg | ORAL_TABLET | Freq: Every day | ORAL | 3 refills | Status: DC

## 2023-08-15 MED ORDER — IRON (FERROUS SULFATE) 325 (65 FE) MG PO TABS: 325.0000 mg | ORAL_TABLET | Freq: Every day | ORAL | 0 refills | Status: DC

## 2023-08-15 NOTE — Telephone Encounter (Signed)
 Crestor and ferrous sent to [facility] on [address]

## 2023-08-15 NOTE — Addendum Note (Signed)
 Addended by: Maximino Sarin on: 08/15/2023 01:15 PM   Modules accepted: Orders

## 2023-08-15 NOTE — Telephone Encounter (Signed)
 Copied from CRM [id number]. Topic: Clinical - Prescription Issue >> Aug 15, 2023 12:02 PM Sim Boast F wrote: Reason for CRM: Relayed lab results to patient, patient request the ferrous sulfate ( per Dr. Antoine Poche note) be sent to the [facility] on file

## 2023-08-18 DIAGNOSIS — H16143 Punctate keratitis, bilateral: Secondary | ICD-10-CM | POA: Diagnosis not present

## 2023-08-18 DIAGNOSIS — H0102B Squamous blepharitis left eye, upper and lower eyelids: Secondary | ICD-10-CM | POA: Diagnosis not present

## 2023-08-18 DIAGNOSIS — Z961 Presence of intraocular lens: Secondary | ICD-10-CM | POA: Diagnosis not present

## 2023-08-18 DIAGNOSIS — H0102A Squamous blepharitis right eye, upper and lower eyelids: Secondary | ICD-10-CM | POA: Diagnosis not present

## 2023-08-19 ENCOUNTER — Other Ambulatory Visit: Payer: Self-pay

## 2023-08-19 ENCOUNTER — Emergency Department (HOSPITAL_COMMUNITY): Admission: EM | Admit: 2023-08-19 | Discharge: 2023-08-20 | Disposition: A | Discharge status: Home / Self Care

## 2023-08-19 ENCOUNTER — Encounter (HOSPITAL_COMMUNITY): Payer: Self-pay | Admitting: Emergency Medicine

## 2023-08-19 ENCOUNTER — Emergency Department (HOSPITAL_COMMUNITY)

## 2023-08-19 DIAGNOSIS — G459 Transient cerebral ischemic attack, unspecified: Secondary | ICD-10-CM | POA: Diagnosis not present

## 2023-08-19 DIAGNOSIS — R0689 Other abnormalities of breathing: Secondary | ICD-10-CM | POA: Diagnosis not present

## 2023-08-19 DIAGNOSIS — R531 Weakness: Secondary | ICD-10-CM | POA: Diagnosis not present

## 2023-08-19 DIAGNOSIS — R55 Syncope and collapse: Secondary | ICD-10-CM | POA: Diagnosis present

## 2023-08-19 DIAGNOSIS — E876 Hypokalemia: Secondary | ICD-10-CM | POA: Diagnosis not present

## 2023-08-19 DIAGNOSIS — R42 Dizziness and giddiness: Secondary | ICD-10-CM | POA: Diagnosis not present

## 2023-08-19 DIAGNOSIS — D649 Anemia, unspecified: Secondary | ICD-10-CM | POA: Diagnosis not present

## 2023-08-19 DIAGNOSIS — E041 Nontoxic single thyroid nodule: Secondary | ICD-10-CM | POA: Insufficient documentation

## 2023-08-19 LAB — BASIC METABOLIC PANEL WITH GFR
Anion gap: 10 (ref 5–15)
BUN: 19 mg/dL (ref 8–23)
CO2: 24 mmol/L (ref 22–32)
Calcium: 8.9 mg/dL (ref 8.9–10.3)
Chloride: 104 mmol/L (ref 98–111)
Creatinine, Ser: 1.62 mg/dL — ABNORMAL HIGH (ref 0.61–1.24)
GFR, Estimated: 43 mL/min — ABNORMAL LOW (ref >60)
Glucose, Bld: 132 mg/dL — ABNORMAL HIGH (ref 70–99)
Potassium: 3 mmol/L — ABNORMAL LOW (ref 3.5–5.1)
Sodium: 138 mmol/L (ref 135–145)

## 2023-08-19 LAB — CBC
HCT: 39.6 % (ref 39.0–52.0)
Hemoglobin: 12.2 g/dL — ABNORMAL LOW (ref 13.0–17.0)
MCH: 25.8 pg — ABNORMAL LOW (ref 26.0–34.0)
MCHC: 30.8 g/dL (ref 30.0–36.0)
MCV: 83.7 fL (ref 80.0–100.0)
Platelets: 252 10*3/uL (ref 150–400)
RBC: 4.73 MIL/uL (ref 4.22–5.81)
RDW: 16.3 % — ABNORMAL HIGH (ref 11.5–15.5)
WBC: 7.5 10*3/uL (ref 4.0–10.5)
nRBC: 0 % (ref 0.0–0.2)

## 2023-08-19 LAB — MAGNESIUM: Magnesium: 2.1 mg/dL (ref 1.7–2.4)

## 2023-08-19 LAB — TROPONIN I (HIGH SENSITIVITY): Troponin I (High Sensitivity): 4 ng/L (ref <18)

## 2023-08-19 MED ORDER — IOHEXOL 350 MG/ML SOLN
75.0000 mL | Freq: Once | INTRAVENOUS | Status: AC | PRN
  Administered 2023-08-19: 75 mL via INTRAVENOUS

## 2023-08-19 MED ORDER — SODIUM CHLORIDE 0.9 % IV BOLUS
500.0000 mL | Freq: Once | INTRAVENOUS | Status: AC
  Administered 2023-08-19: 500 mL via INTRAVENOUS

## 2023-08-19 MED ORDER — POTASSIUM CHLORIDE CRYS ER 20 MEQ PO TBCR
40.0000 meq | EXTENDED_RELEASE_TABLET | Freq: Once | ORAL | Status: AC
  Administered 2023-08-19: 40 meq via ORAL
  Filled 2023-08-19: qty 2

## 2023-08-19 NOTE — ED Notes (Signed)
 Called lab to collect mag and trop. Per lab they will be ran at this time

## 2023-08-19 NOTE — ED Provider Notes (Signed)
 [place] EMERGENCY DEPARTMENT AT [facility] Provider Note   CSN: [id number] Arrival date & time: 08/19/23  2144     History {Add pertinent medical, surgical, social history, OB history to HPI:1} Chief Complaint  Patient presents with  . Near Syncope    LOWERY PAULLIN is a 83 y.o. male.   Near Syncope  83 year old male presents emergency department with complaints of dizziness, headache.  Reports he was sitting at home filling out some paperwork when he noticed abrupt onset of frontal headache with associated dizziness.  States the headache only lasted minutes to before went away independent of medication.  Describes dizziness as room spinning sensation.  States he has had this sensation before but has been several years ago.  States that he sat there for 5 to 10 minutes before symptoms resolved.  States that he tried to get up a couple times when the dizziness would return more abruptly causing him to sit down because he felt like he would fall.  States that he currently feels at baseline and does not have any feelings of dizziness, headache.  Denies any associated chest pain, shortness of breath.  Denies any blurry vision, double vision, difficulty swallowing/speaking, weakness/sensory deficit of lower extremities.  Past medical history significant for hypertension, hyperlipidemia, PE on Eliquis, OSA, ulcerative colitis, BPPV, colitis  Home Medications Prior to Admission medications   Medication Sig Start Date End Date Taking? Authorizing Provider  apixaban (ELIQUIS) 5 MG TABS tablet Take 1 tablet (5 mg total) by mouth 2 (two) times daily. 08/12/23   Sharlene Dory, DO  clotrimazole (LOTRIMIN) 1 % cream Apply 1 Application topically 2 (two) times daily. Until rash is resolved Patient not taking: Reported on 08/12/2023 07/03/23   Alfredia Ferguson, PA-C  diphenoxylate-atropine (LOMOTIL) 2.5-0.025 MG tablet TAKE 1 TABLET BY MOUTH TWICE A DAY AS NEEDED FOR DIARRHEA OR  LOOSE STOOLS 08/07/23   Hilarie Fredrickson, MD  doxazosin (CARDURA) 8 MG tablet TAKE ONE-HALF (1/2) TABLET AT BEDTIME 02/18/23   Wendling, Jilda Roche, DO  furosemide (LASIX) 40 MG tablet Take 1 tablet (40 mg total) by mouth 2 (two) times daily. 08/12/23   Sharlene Dory, DO  Iron, Ferrous Sulfate, 325 (65 Fe) MG TABS Take 325 mg by mouth daily. 08/15/23   Sharlene Dory, DO  mirtazapine (REMERON) 30 MG tablet Take 1 tablet (30 mg total) by mouth at bedtime. 08/12/23   Sharlene Dory, DO  potassium chloride (KLOR-CON 10) 10 MEQ tablet Take 2 tabs with every Lasix dose. 08/12/23   Sharlene Dory, DO  rosuvastatin (CRESTOR) 10 MG tablet Take 1 tablet (10 mg total) by mouth daily. Pt must schedule appt with provider for further refills - 1st attempt 08/15/23   Sharlene Dory, DO  trimethoprim-polymyxin b (POLYTRIM) ophthalmic solution Place 1 drop into both eyes every 4 (four) hours. 08/12/23   Sharlene Dory, DO      Allergies    Patient has no known allergies.    Review of Systems   Review of Systems  Cardiovascular:  Positive for near-syncope.  All other systems reviewed and are negative.   Physical Exam Updated Vital Signs BP 131/71 (BP Location: Right Arm)   Pulse 84   Temp 98.7 F (37.1 C) (Oral)   Resp 17   SpO2 97%  Physical Exam Vitals and nursing note reviewed.  Constitutional:      General: He is not in acute distress.    Appearance: He is  well-developed.  HENT:     Head: Normocephalic and atraumatic.  Eyes:     Conjunctiva/sclera: Conjunctivae normal.  Cardiovascular:     Rate and Rhythm: Normal rate and regular rhythm.  Pulmonary:     Effort: Pulmonary effort is normal. No respiratory distress.     Breath sounds: Normal breath sounds. No wheezing, rhonchi or rales.  Abdominal:     Palpations: Abdomen is soft.     Tenderness: There is no abdominal tenderness.  Musculoskeletal:        General: No swelling.     Cervical  back: Neck supple.  Skin:    General: Skin is warm and dry.     Capillary Refill: Capillary refill takes less than 2 seconds.  Neurological:     Mental Status: He is alert.     Comments: Alert and oriented to self, place, time and event.   Speech is fluent, clear without dysarthria or dysphasia.   Strength symmetric in upper/lower extremities  Sensation intact in upper/lower extremities   Normal gait.  Negative Romberg. No pronator drift.  Normal finger-to-nose and feet tapping.  CN I not tested  CN II not tested CN III, IV, VI PERRLA and EOMs intact bilaterally  CN V Intact sensation to sharp and light touch to the face  CN VII facial movements symmetric  CN VIII not tested  CN IX, X no uvula deviation, symmetric rise of soft palate  CN XI symmetric SCM and trapezius strength bilaterally  CN XII Midline tongue protrusion, symmetric L/R movements     Psychiatric:        Mood and Affect: Mood normal.    ED Results / Procedures / Treatments   Labs (all labs ordered are listed, but only abnormal results are displayed) Labs Reviewed  CBC - Abnormal; Notable for the following components:      Result Value   Hemoglobin 12.2 (*)    MCH 25.8 (*)    RDW 16.3 (*)    All other components within normal limits  BASIC METABOLIC PANEL WITH GFR  CBG MONITORING, ED  TROPONIN I (HIGH SENSITIVITY)    EKG None  Radiology No results found.  Procedures Procedures  {Document cardiac monitor, telemetry assessment procedure when appropriate:1}  Medications Ordered in ED Medications - No data to display  ED Course/ Medical Decision Making/ A&P   {   Click here for ABCD2, HEART and other calculatorsREFRESH Note before signing :1}                              Medical Decision Making Amount and/or Complexity of Data Reviewed Labs: ordered. Radiology: ordered.   This patient presents to the ED for concern of dizziness, this involves an extensive number of treatment options,  and is a complaint that carries with it a high risk of complications and morbidity.  The differential diagnosis includes ACS, PE, CVA, PUD, bppv, Mnire's disease, labyrinthitis, medication side effect, other   Co morbidities that complicate the patient evaluation  See HPI   Additional history obtained:  Additional history obtained from EMR External records from outside source obtained and reviewed including hospital records   Lab Tests:  I Ordered, and personally interpreted labs.  The pertinent results include:  ***   Imaging Studies ordered:  I ordered imaging studies including CTA head neck I independently visualized and interpreted imaging which showed *** I agree with the radiologist interpretation   Cardiac Monitoring: /  EKG:  The patient was maintained on a cardiac monitor.  I personally viewed and interpreted the cardiac monitored which showed an underlying rhythm of: ***   Consultations Obtained:  I requested consultation with the ***,  and discussed lab and imaging findings as well as pertinent plan - they recommend: ***   Problem List / ED Course / Critical interventions / Medication management  *** I ordered medication including ***  for ***  Reevaluation of the patient after these medicines showed that the patient {resolved/improved/worsened:23923::"improved"} I have reviewed the patients home medicines and have made adjustments as needed   Social Determinants of Health:  ***   Test / Admission - Considered:  Vitals signs significant for ***. Otherwise within normal range and stable throughout visit. Laboratory/imaging studies significant for: *** *** Worrisome signs and symptoms were discussed with the patient, and the patient acknowledged understanding to return to the ED if noticed. Patient was stable upon discharge.    {Document critical care time when appropriate:1} {Document review of labs and clinical decision tools ie heart score,  Chads2Vasc2 etc:1}  {Document your independent review of radiology images, and any outside records:1} {Document your discussion with family members, caretakers, and with consultants:1} {Document social determinants of health affecting pt's care:1} {Document your decision making why or why not admission, treatments were needed:1} Final Clinical Impression(s) / ED Diagnoses Final diagnoses:  None    Rx / DC Orders ED Discharge Orders     None

## 2023-08-19 NOTE — ED Triage Notes (Signed)
 Pt arriving via EMS from home for sudden onset of dizziness. Pt A&O x4 upon arrival. No hx of SOB, CHF.

## 2023-08-20 MED ORDER — MECLIZINE HCL 12.5 MG PO TABS: 12.5000 mg | ORAL_TABLET | Freq: Three times a day (TID) | ORAL | 0 refills | Status: DC | PRN

## 2023-08-20 NOTE — Discharge Instructions (Addendum)
 As discussed, recommend follow-up with your ent/pcp in the outpatient setting.  Will also attach information for epley maneuvers if you become symptomatic again.  Please and hesitate to return if the worrisome signs and symptoms we discussed become apparent.

## 2023-08-20 NOTE — ED Notes (Signed)
 Patient needs assistance with a ride home. Charge aware.

## 2023-08-20 NOTE — ED Notes (Signed)
 About 15 mins for cab to arrive

## 2023-08-20 NOTE — ED Notes (Signed)
 [employer] called for transportation home

## 2023-08-29 ENCOUNTER — Ambulatory Visit (INDEPENDENT_AMBULATORY_CARE_PROVIDER_SITE_OTHER): Admitting: Family Medicine

## 2023-08-29 ENCOUNTER — Encounter: Payer: Self-pay | Admitting: Family Medicine

## 2023-08-29 VITALS — BP 124/88 | HR 89 | Temp 98.4°F | Ht 73.0 in | Wt 249.6 lb

## 2023-08-29 DIAGNOSIS — E041 Nontoxic single thyroid nodule: Secondary | ICD-10-CM | POA: Diagnosis not present

## 2023-08-29 DIAGNOSIS — L989 Disorder of the skin and subcutaneous tissue, unspecified: Secondary | ICD-10-CM

## 2023-08-29 MED ORDER — TRIAMCINOLONE ACETONIDE 0.5 % EX OINT: 1.0000 | TOPICAL_OINTMENT | Freq: Two times a day (BID) | CUTANEOUS | 1 refills | Status: DC

## 2023-08-29 NOTE — Patient Instructions (Addendum)
 Let me know if there are issues with the skin.  Someone will reach out regarding your ultrasound and we will be in touch with your results.   Let us know if you need anything.

## 2023-08-29 NOTE — Progress Notes (Signed)
 Chief Complaint  Patient presents with   Acute Visit    Patient presents today for skin check to right leg.    Bill Fox is a 83 y.o. male here for a skin complaint.  Duration: several weeks Location: RLE Pruritic? No Painful? No Drainage? No Trauma? No Other associated symptoms: no spreading, fevers, redness Therapies tried thus far: steroid cream which did work last week  ER visit, CTA done showing a 2 cm lesion on thyroid. He is having no s/s's, difficulty swallowing or voice changing.   Past Medical History:  Diagnosis Date   Anxiety    At risk for sleep apnea    STOP-BANG= 5     SENT TO PCP 11-29-2013   Cataract    Chronic fatigue    DJD (degenerative joint disease)    RIGHT KNEE   History of colon polyps    2011  &  2013 --  ADENOMATOUS   History of pulmonary embolism    Hyperlipidemia    pt denies ever having high chol   Hypertension    Right knee meniscal tear    Ulcerative colitis     BP 124/88   Pulse 89   Temp 98.4 F (36.9 C)   Ht 6\' 1"  (1.854 m)   Wt 249 lb 9.6 oz (113.2 kg)   SpO2 97%   BMI 32.93 kg/m  Gen: awake, alert, appearing stated age Lungs: No accessory muscle use Skin: see below. No drainage, erythema, TTP, fluctuance, excoriation Psych: Age appropriate judgment and insight   RLE  Skin lesion - Plan: triamcinolone ointment (KENALOG) 0.5 %  Thyroid nodule greater than or equal to 1.5 cm in diameter incidentally noted on imaging study - Plan: US THYROID  Will re-rx above ointment as this worked well last year. Could consider biopsy if no better.  F/u on [place].  F/u prn. The patient voiced understanding and agreement to the plan.  [name], DO 08/29/23 12:57 PM

## 2023-09-02 ENCOUNTER — Telehealth: Payer: Self-pay | Admitting: Neurology

## 2023-09-02 NOTE — Telephone Encounter (Signed)
 Do we have any samples? Can we refer to Tammy to help? Thx.

## 2023-09-02 NOTE — Telephone Encounter (Signed)
 Copied from CRM [id number]. Topic: Clinical - Prescription Issue >> Sep 02, 2023 12:49 PM Howard Macho wrote: Reason for CRM: patient called stating his apixaban (ELIQUIS) 5 MG TABS tablet has went from $50 to $500 and he want to know if the doctor can put him on something else because he can not afford it

## 2023-09-03 ENCOUNTER — Telehealth: Payer: Self-pay | Admitting: Family Medicine

## 2023-09-03 NOTE — Telephone Encounter (Signed)
 Patient has [facility] as his pharmacy provider. He can get 2 fills of medications at local pharmacy but after he has to use Mail order services - [facility].   Eliquis refill history Dispenses   Dispensed Days Supply Quantity Provider Pharmacy  ELIQUIS 5 MG TABLET 09/02/2023 30 60 tablet Jobe Mulder, [place] [phone] - R...  ELIQUIS 5 MG TABLET 08/03/2023 30 60 each [facility] [place], [place] [facility] [phone] - R...  ELIQUIS 5 MG TABLET 07/08/2023 30 60 each [facility] [place], [place] [facility] [phone] - R...  ELIQUIS 5 MG TABLET 02/18/2023 90 180 tablet Jobe Mulder, [place] [facility]  ELIQUIS 5 MG TABLET 10/21/2022 90 180 tablet Jobe Mulder, [place] [facility]      Called [facility] and they have an Rx on file for Eliquis but patient needs to call to update his address and records. Patient aware to call [facility].  I provided patient #[id number] / 7 days of Eliquis 5mg  samples until he is able to get next Rx from [facility]. - cost should be around $38.   Cecilie Coffee, PharmD Clinical Pharmacist [facility]

## 2023-09-03 NOTE — Telephone Encounter (Signed)
 Copied from CRM [id number]. Topic: Clinical - Prescription Issue >> Sep 02, 2023 12:49 PM Howard Macho wrote: Reason for CRM: patient called stating his apixaban (ELIQUIS) 5 MG TABS tablet has went from $50 to $500 and he want to know if the doctor can put him on something else because he can not afford it >> Sep 03, 2023  4:33 PM Chuck Crater wrote: Patient wants to speak with Tammy in regards to his scripts. He confirmed his address [address] [place], [place] [address] to send his Eliquis Medication. He wants to know if she can give his address to [facility]. He states that he only gets a recording.  Patient is requesting a callback.

## 2023-09-03 NOTE — Telephone Encounter (Signed)
 Duplicate note, closing this encounter.

## 2023-09-03 NOTE — Telephone Encounter (Signed)
 FYI  Copied from CRM [id number]. Topic: Clinical - Prescription Issue >> Sep 03, 2023 10:18 AM Bill Fox wrote: Reason for CRM: THE PT CALLED BACK THIS MORNING IN REGARDS TO HIS ELIQUIS MEDS I DID ADVISE THE DOCTOR WAS WORKING ON IT. THERE WAS A CRM CREATED ON YESTERDAY. FORWARDING MESSAGE TO NURSE

## 2023-09-04 ENCOUNTER — Other Ambulatory Visit (HOSPITAL_BASED_OUTPATIENT_CLINIC_OR_DEPARTMENT_OTHER): Admitting: Radiology

## 2023-09-04 NOTE — Telephone Encounter (Signed)
 Patient called - he did leave a message on VM for [facility] verifying his shipping address ([address]) late yesterday.  Called [facility] today and was able to updated their records with correct shipping address. They are sending Eliquis and any other needed medications. Patient will receive medications in 3 to 5 days.

## 2023-09-06 ENCOUNTER — Telehealth: Payer: Self-pay | Admitting: Family Medicine

## 2023-09-06 NOTE — Telephone Encounter (Signed)
 Copied from CRM [id number]. Topic: General - Other >> Sep 05, 2023  4:07 PM Melissa C wrote: Reason for CRM: Patient asked to speak with Tammy, I advised him that the office was closed due to the Holiday, however I could put a message in for the office to get back with him next week. He would like for Tammy to give him a call upon return next week. Thank you.

## 2023-09-08 ENCOUNTER — Ambulatory Visit (HOSPITAL_BASED_OUTPATIENT_CLINIC_OR_DEPARTMENT_OTHER)
Admission: RE | Admit: 2023-09-08 | Discharge: 2023-09-08 | Disposition: A | Discharge status: Home / Self Care | Source: Ambulatory Visit | Attending: Family Medicine | Admitting: Family Medicine

## 2023-09-08 ENCOUNTER — Other Ambulatory Visit: Payer: Self-pay | Admitting: Family Medicine

## 2023-09-08 DIAGNOSIS — E041 Nontoxic single thyroid nodule: Secondary | ICD-10-CM | POA: Diagnosis not present

## 2023-09-08 DIAGNOSIS — M7989 Other specified soft tissue disorders: Secondary | ICD-10-CM

## 2023-09-09 NOTE — Telephone Encounter (Signed)
 Patient called. He wanted to let me know that he received his delivery from [facility] for Eliquis  - he received a 90 days supply and copay was $26!

## 2023-09-10 ENCOUNTER — Other Ambulatory Visit: Payer: Self-pay

## 2023-09-10 ENCOUNTER — Telehealth: Payer: Self-pay

## 2023-09-10 ENCOUNTER — Other Ambulatory Visit: Payer: Self-pay | Admitting: Family Medicine

## 2023-09-10 ENCOUNTER — Telehealth: Payer: Self-pay | Admitting: Family Medicine

## 2023-09-10 DIAGNOSIS — D649 Anemia, unspecified: Secondary | ICD-10-CM

## 2023-09-10 MED ORDER — IRON (FERROUS SULFATE) 325 (65 FE) MG PO TABS: 325.0000 mg | ORAL_TABLET | Freq: Every day | ORAL | 2 refills | Status: DC

## 2023-09-10 MED ORDER — IRON (FERROUS SULFATE) 325 (65 FE) MG PO TABS: 325.0000 mg | ORAL_TABLET | Freq: Every day | ORAL | 0 refills | Status: DC

## 2023-09-10 NOTE — Telephone Encounter (Unsigned)
 Copied from CRM [id number]. Topic: Clinical - Medical Advice >> Sep 10, 2023  1:44 PM Danae Duncans wrote: Reason for CRM: pt would like a callback from nurse asap- needing clarification on what dr. Gwenette Lennox advise him

## 2023-09-10 NOTE — Telephone Encounter (Signed)
 Copied from CRM [id number]. Topic: Clinical - Medication Refill >> Sep 10, 2023 11:23 AM Freya Jesus wrote: Most Recent Primary Care Visit:  Provider: Jobe Mulder  Department: [facility]  Visit Type: OFFICE VISIT  Date: 08/29/2023  Medication: Iron , Ferrous Sulfate , 325 (65 Fe) MG TABS [[id number]]  Has the patient contacted their pharmacy? Yes (Agent: If no, request that the patient contact the pharmacy for the refill. If patient does not wish to contact the pharmacy document the reason why and proceed with request.) (Agent: If yes, when and what did the pharmacy advise?) need new prescription  Is this the correct pharmacy for this prescription? Yes If no, delete pharmacy and type the correct one.  This is the patient's preferred pharmacy:   [id number] [place], [place] - [address] [place] [address] Phone: [phone] Fax: [phone]   Has the prescription been filled recently? Yes  Is the patient out of the medication? No, almost  Has the patient been seen for an appointment in the last year OR does the patient have an upcoming appointment? Yes  Can we respond through MyChart? No  Agent: Please be advised that Rx refills may take up to 3 business days. We ask that you follow-up with your pharmacy.

## 2023-09-10 NOTE — Telephone Encounter (Signed)
 Copied from CRM [id number]. Topic: Clinical - Medical Advice >> Sep 10, 2023  1:44 PM Danae Duncans wrote: Reason for CRM: pt would like a callback from nurse asap- needing clarification on what dr. Gwenette Lennox advise him >> Sep 10, 2023  3:59 PM [place] J wrote: Patient calling back regarding medication again. He needs to know if he's supposed to be taking a certain medication and if so, if he's doing it correctly. Warm transferred to CAL.  Pt stated he only received a 30 days supply and wanted to know if he needs to do more blood work or how long he needs to be take iron  medication. Spoke with Vanetta Generous and she advised me to send another 2 months, recheck labs after that. We sent 60 days and scheduled lab appt for repeat on iron . Pt stated he understand.

## 2023-09-10 NOTE — Telephone Encounter (Signed)
 Stay on it daily until his recheck and we can go from there. Thx.

## 2023-10-12 ENCOUNTER — Emergency Department (HOSPITAL_COMMUNITY)

## 2023-10-12 ENCOUNTER — Emergency Department (HOSPITAL_COMMUNITY): Admission: EM | Admit: 2023-10-12 | Discharge: 2023-10-12 | Disposition: A | Discharge status: Home / Self Care

## 2023-10-12 ENCOUNTER — Encounter (HOSPITAL_COMMUNITY): Payer: Self-pay

## 2023-10-12 ENCOUNTER — Other Ambulatory Visit: Payer: Self-pay

## 2023-10-12 DIAGNOSIS — G319 Degenerative disease of nervous system, unspecified: Secondary | ICD-10-CM | POA: Diagnosis not present

## 2023-10-12 DIAGNOSIS — R5383 Other fatigue: Secondary | ICD-10-CM | POA: Diagnosis not present

## 2023-10-12 DIAGNOSIS — R4182 Altered mental status, unspecified: Secondary | ICD-10-CM | POA: Diagnosis not present

## 2023-10-12 DIAGNOSIS — I639 Cerebral infarction, unspecified: Secondary | ICD-10-CM | POA: Diagnosis not present

## 2023-10-12 DIAGNOSIS — Z7901 Long term (current) use of anticoagulants: Secondary | ICD-10-CM | POA: Diagnosis not present

## 2023-10-12 DIAGNOSIS — W1839XA Other fall on same level, initial encounter: Secondary | ICD-10-CM | POA: Insufficient documentation

## 2023-10-12 DIAGNOSIS — R519 Headache, unspecified: Secondary | ICD-10-CM | POA: Diagnosis not present

## 2023-10-12 DIAGNOSIS — R42 Dizziness and giddiness: Secondary | ICD-10-CM | POA: Insufficient documentation

## 2023-10-12 LAB — CBC
HCT: 44.4 % (ref 39.0–52.0)
Hemoglobin: 13.5 g/dL (ref 13.0–17.0)
MCH: 26.3 pg (ref 26.0–34.0)
MCHC: 30.4 g/dL (ref 30.0–36.0)
MCV: 86.4 fL (ref 80.0–100.0)
Platelets: 255 10*3/uL (ref 150–400)
RBC: 5.14 MIL/uL (ref 4.22–5.81)
RDW: 15.9 % — ABNORMAL HIGH (ref 11.5–15.5)
WBC: 8.5 10*3/uL (ref 4.0–10.5)
nRBC: 0 % (ref 0.0–0.2)

## 2023-10-12 LAB — COMPREHENSIVE METABOLIC PANEL WITH GFR
ALT: 13 U/L (ref 0–44)
AST: 18 U/L (ref 15–41)
Albumin: 3.7 g/dL (ref 3.5–5.0)
Alkaline Phosphatase: 56 U/L (ref 38–126)
Anion gap: 11 (ref 5–15)
BUN: 17 mg/dL (ref 8–23)
CO2: 23 mmol/L (ref 22–32)
Calcium: 8.7 mg/dL — ABNORMAL LOW (ref 8.9–10.3)
Chloride: 105 mmol/L (ref 98–111)
Creatinine, Ser: 1.31 mg/dL — ABNORMAL HIGH (ref 0.61–1.24)
GFR, Estimated: 55 mL/min — ABNORMAL LOW (ref >60)
Glucose, Bld: 114 mg/dL — ABNORMAL HIGH (ref 70–99)
Potassium: 3.8 mmol/L (ref 3.5–5.1)
Sodium: 139 mmol/L (ref 135–145)
Total Bilirubin: 0.2 mg/dL (ref 0.0–1.2)
Total Protein: 7.8 g/dL (ref 6.5–8.1)

## 2023-10-12 LAB — PROTIME-INR
INR: 1.5 — ABNORMAL HIGH (ref 0.8–1.2)
Prothrombin Time: 17.9 s — ABNORMAL HIGH (ref 11.4–15.2)

## 2023-10-12 LAB — ETHANOL: Alcohol, Ethyl (B): <15 mg/dL (ref <15)

## 2023-10-12 LAB — APTT: aPTT: 35 s (ref 24–36)

## 2023-10-12 LAB — AMMONIA: Ammonia: 21 umol/L (ref 9–35)

## 2023-10-12 NOTE — ED Triage Notes (Addendum)
 Pt arrives via POV. Pt reports Friday night, he began feeling fatigued and dizzy. Pt reports he fell yesterday. Denies any injury. Did not hit his head. PT was seen at [facility] today and was sent to ED for further workup. Pt is AxOx4. Pt reports a slight headache. No focal neurological deficits noted during triage. Pt is on eliquis .

## 2023-10-12 NOTE — Discharge Instructions (Signed)
 Please follow-up with your primary doctor.  Return immediately for fevers, chills, sudden onset headache, vision loss, facial droop, unilateral weakness, lightheadedness, passout, chest pain, shortness of breath or any new or worsening symptoms that are concerning to you.

## 2023-10-12 NOTE — ED Provider Notes (Signed)
 [place] EMERGENCY DEPARTMENT AT [facility] Provider Note   CSN: [id number] Arrival date & time: 10/12/23  1653     History  Chief Complaint  Patient presents with   Dizziness   Fatigue    Bill Fox is a 83 y.o. male.  This is an 83 year old male presenting emergency department with concern for possible stroke.  He reports 2 days ago he noticed that he was more sleepy and that his words were seemingly slurred and he had some intermittent hesitation finding his words, and feeling off balance.  He did fall Friday night, did not hit his head, no LOC.  Does take Eliquis  however.  Denies chest pain, shortness of breath or palpitations.  No fevers no chills.   Dizziness      Home Medications Prior to Admission medications   Medication Sig Start Date End Date Taking? Authorizing Provider  apixaban  (ELIQUIS ) 5 MG TABS tablet Take 1 tablet (5 mg total) by mouth 2 (two) times daily. 08/12/23   Jobe Mulder, DO  diphenoxylate -atropine  (LOMOTIL ) 2.5-0.025 MG tablet TAKE 1 TABLET BY MOUTH TWICE A DAY AS NEEDED FOR DIARRHEA OR LOOSE STOOLS 08/07/23   Tobin Forts, MD  doxazosin  (CARDURA ) 8 MG tablet TAKE ONE-HALF (1/2) TABLET AT BEDTIME 02/18/23   Wendling, Shellie Dials, DO  furosemide  (LASIX ) 40 MG tablet Take 1 tablet (40 mg total) by mouth 2 (two) times daily. 08/12/23   Jobe Mulder, DO  Iron , Ferrous Sulfate , 325 (65 Fe) MG TABS Take 325 mg by mouth daily. 09/10/23   Jobe Mulder, DO  meclizine  (ANTIVERT ) 12.5 MG tablet Take 1 tablet (12.5 mg total) by mouth 3 (three) times daily as needed for dizziness. 08/20/23   Dickenson Butter, PA  mirtazapine  (REMERON ) 30 MG tablet Take 1 tablet (30 mg total) by mouth at bedtime. 08/12/23   Jobe Mulder, DO  potassium chloride  (KLOR-CON  10) 10 MEQ tablet Take 2 tabs with every Lasix  dose. 08/12/23   Jobe Mulder, DO  rosuvastatin  (CRESTOR ) 10 MG tablet Take 1 tablet (10 mg total)  by mouth daily. Pt must schedule appt with provider for further refills - 1st attempt 08/15/23   Jobe Mulder, DO  triamcinolone  ointment (KENALOG ) 0.5 % Apply 1 Application topically 2 (two) times daily. 08/29/23   Jobe Mulder, DO  trimethoprim -polymyxin b  (POLYTRIM ) ophthalmic solution Place 1 drop into both eyes every 4 (four) hours. 08/12/23   Jobe Mulder, DO      Allergies    Patient has no known allergies.    Review of Systems   Review of Systems  Neurological:  Positive for dizziness.    Physical Exam Updated Vital Signs BP 124/76 (BP Location: Left Arm)   Pulse 97   Temp 98.3 F (36.8 C) (Oral)   Resp 20   SpO2 96%  Physical Exam Vitals and nursing note reviewed.  Constitutional:      General: He is not in acute distress.    Appearance: He is not toxic-appearing.  HENT:     Head: Normocephalic.     Nose: Nose normal.     Mouth/Throat:     Mouth: Mucous membranes are moist.  Eyes:     Conjunctiva/sclera: Conjunctivae normal.  Cardiovascular:     Rate and Rhythm: Normal rate and regular rhythm.  Pulmonary:     Effort: Pulmonary effort is normal.     Breath sounds: Normal breath sounds.  Abdominal:  General: Abdomen is flat. There is no distension.     Palpations: Abdomen is soft.     Tenderness: There is no abdominal tenderness. There is no guarding or rebound.  Musculoskeletal:        General: Normal range of motion.  Skin:    General: Skin is warm and dry.     Capillary Refill: Capillary refill takes less than 2 seconds.  Neurological:     General: No focal deficit present.     Mental Status: He is alert and oriented to person, place, and time.     Cranial Nerves: No cranial nerve deficit.     Sensory: No sensory deficit.     Motor: No weakness.     Coordination: Coordination normal.  Psychiatric:        Mood and Affect: Mood normal.        Behavior: Behavior normal.     ED Results / Procedures / Treatments    Labs (all labs ordered are listed, but only abnormal results are displayed) Labs Reviewed  COMPREHENSIVE METABOLIC PANEL WITH GFR - Abnormal; Notable for the following components:      Result Value   Glucose, Bld 114 (*)    Creatinine, Ser 1.31 (*)    Calcium  8.7 (*)    GFR, Estimated 55 (*)    All other components within normal limits  CBC - Abnormal; Notable for the following components:   RDW 15.9 (*)    All other components within normal limits  PROTIME-INR - Abnormal; Notable for the following components:   Prothrombin Time 17.9 (*)    INR 1.5 (*)    All other components within normal limits  APTT  AMMONIA  ETHANOL  URINALYSIS, ROUTINE W REFLEX MICROSCOPIC  CBG MONITORING, ED    EKG EKG Interpretation Date/Time:  Sunday Oct 12 2023 17:16:46 EDT Ventricular Rate:  86 PR Interval:  169 QRS Duration:  109 QT Interval:  389 QTC Calculation: 466 R Axis:   110  Text Interpretation: Sinus rhythm Anterior infarct, old Confirmed by Elise Guile [phone]) on 10/12/2023 8:03:23 PM  Radiology CT Head Wo Contrast Result Date: 10/12/2023 CLINICAL DATA:  Head trauma, minor (Age >= 65y). Fall. Fatigue and dizzy EXAM: CT HEAD WITHOUT CONTRAST TECHNIQUE: Contiguous axial images were obtained from the base of the skull through the vertex without intravenous contrast. RADIATION DOSE REDUCTION: This exam was performed according to the departmental dose-optimization program which includes automated exposure control, adjustment of the mA and/or kV according to patient size and/or use of iterative reconstruction technique. COMPARISON:  CT angio head and neck 08/19/2023 FINDINGS: Brain: Cerebral ventricle sizes are concordant with the degree of cerebral volume loss. Patchy and confluent areas of decreased attenuation are noted throughout the deep and periventricular white matter of the cerebral hemispheres bilaterally, compatible with chronic microvascular ischemic disease. No evidence of  large-territorial acute infarction. No parenchymal hemorrhage. No mass lesion. No extra-axial collection. No mass effect or midline shift. No hydrocephalus. Basilar cisterns are patent. Vascular: No hyperdense vessel. Skull: No acute fracture or focal lesion. Sinuses/Orbits: Paranasal sinuses and mastoid air cells are clear. The orbits are unremarkable. Other: None. IMPRESSION: No acute intracranial abnormality. Electronically Signed   By: Morgane  Naveau M.D.   On: 10/12/2023 19:40   MR BRAIN WO CONTRAST Result Date: 10/12/2023 CLINICAL DATA:  Stroke, follow up EXAM: MRI HEAD WITHOUT CONTRAST TECHNIQUE: Multiplanar, multiecho pulse sequences of the brain and surrounding structures were obtained without intravenous contrast. COMPARISON:  CT head  from today. FINDINGS: Brain: No acute infarction, hemorrhage, hydrocephalus, extra-axial collection or mass lesion. Small remote right cerebellar infarcts. Cerebral atrophy. Vascular: Major arterial flow voids are maintained. Skull and upper cervical spine: Normal marrow signal. Sinuses/Orbits: Negative. IMPRESSION: No evidence of acute intracranial abnormality. Electronically Signed   By: Stevenson Elbe M.D.   On: 10/12/2023 19:11   DG Chest Portable 1 View Result Date: 10/12/2023 CLINICAL DATA:  Altered mental status, fatigue, and dizziness. Fall yesterday. EXAM: PORTABLE CHEST 1 VIEW COMPARISON:  07/11/2023. FINDINGS: The heart size and mediastinal contours are within normal limits. Linear atelectasis or scarring is present at the left lung base. No consolidation, effusion, or pneumothorax is seen. No acute osseous abnormality. IMPRESSION: No active disease. Electronically Signed   By: Wyvonnia Heimlich M.D.   On: 10/12/2023 18:01    Procedures Procedures    Medications Ordered in ED Medications - No data to display  ED Course/ Medical Decision Making/ A&P Clinical Course as of 10/12/23 2343  Sun Oct 12, 2023  1917 MR BRAIN WO CONTRAST IMPRESSION: No  evidence of acute intracranial abnormality.   Electronically Signed   [TY]  2341 Patient's workup reassuring.  No leukocytosis to suggest infectious process.  No metabolic derangements.  Appears to be near baseline kidney function.  Ammonia not elevated, likely not because of his sleepiness.  Alcohol negative.  Head imaging reassuring; no acute stroke.  He reports feeling improved after here in the emergency department.  He is not having chest pain, lightheadedness or near syncope.  His EKG is otherwise reassuring.  He is able to ambulate with steady gait.  Has normal vitals.  Unclear etiology for symptoms, but he is requesting discharge.  Will have him follow-up with his primary doctor. [TY]    Clinical Course User Index [TY] Rolinda Climes, DO                                 Medical Decision Making This is an 83 year old male presenting to the emergency department with concern for stroke.  Last known normal approximately 2 days ago.  He has no focal deficits on neuroexam exam.  He is speaking in full sentences with intelligible words.  He does not sound dysarthric to me.  He is afebrile he is nontachycardic, he is normotensive.  Maintaining oxygen saturation on room air.  He is on some sedative type medications with mirtazapine , meclizine .  No reported overdose.  Will get screening labs.  Will get CT head/MRI and evaluate for traumatic hemorrhage versus stroke.  See ED course for further MDM and disposition.  Amount and/or Complexity of Data Reviewed External Data Reviewed: radiology.    Details: CTA head/neck last month: IMPRESSION: 1. No emergent large vessel occlusion. 2. Moderate stenosis of the distal left P2 segment. 3. Incidental left thyroid  nodule measuring 2 cm. Recommend non-emergent thyroid  ultrasound. Reference: J Am Coll Radiol. 2015 Feb;12(2): 143-50    Labs: ordered. Decision-making details documented in ED Course. Radiology: ordered and independent interpretation  performed. Decision-making details documented in ED Course.    Details: I do not appreciate obvious intracranial hemorrhage or trauma. ECG/medicine tests: independent interpretation performed. Decision-making details documented in ED Course.  Risk Decision regarding hospitalization. Diagnosis or treatment significantly limited by social determinants of health. Risk Details: Poor health literacy          Final Clinical Impression(s) / ED Diagnoses Final diagnoses:  Nonintractable headache, unspecified chronicity  pattern, unspecified headache type    Rx / DC Orders ED Discharge Orders     None         Rolinda Climes, DO 10/12/23 2343

## 2023-10-20 ENCOUNTER — Encounter: Payer: Self-pay | Admitting: Family Medicine

## 2023-10-20 ENCOUNTER — Ambulatory Visit (INDEPENDENT_AMBULATORY_CARE_PROVIDER_SITE_OTHER): Admitting: Family Medicine

## 2023-10-20 VITALS — BP 128/68 | HR 93 | Temp 98.0°F | Resp 16 | Ht 73.0 in | Wt 246.0 lb

## 2023-10-20 DIAGNOSIS — R519 Headache, unspecified: Secondary | ICD-10-CM

## 2023-10-20 MED ORDER — SILDENAFIL CITRATE 100 MG PO TABS: 50.0000 mg | ORAL_TABLET | Freq: Every day | ORAL | 3 refills | Status: AC | PRN

## 2023-10-20 NOTE — Patient Instructions (Addendum)
 Get your tetanus booster at the pharmacy at your convenience.   Sleep Hygiene Tips: Do not watch TV or look at screens within 1 hour of going to bed. If you do, make sure there is a blue light filter (nighttime mode) involved. Try to go to bed around the same time every night. Wake up at the same time within 1 hour of regular time. Ex: If you wake up at 7 AM for work, do not sleep past 8 AM on days that you don't work. Do not drink alcohol before bedtime. Do not consume caffeine-containing beverages after noon or within 9 hours of intended bedtime. Get regular exercise/physical activity in your life, but not within 2 hours of planned bedtime. Do not take naps.  Do not eat within 2 hours of planned bedtime. Melatonin, 3-5 mg 30-60 minutes before planned bedtime may be helpful.  The bed should be for sleep or sex only. If after 20-30 minutes you are unable to fall asleep, get up and do something relaxing. Do this until you feel ready to go to sleep again.   Let us  know if you need anything.

## 2023-10-20 NOTE — Addendum Note (Signed)
 Addended by: Creed Dodrill on: 10/20/2023 11:21 AM   Modules accepted: Orders

## 2023-10-20 NOTE — Progress Notes (Signed)
 Chief Complaint  Patient presents with   Hospitalization Follow-up    Hospital Follow Up     Subjective: Patient is a 83 y.o. male here for ER f/u.  Went to ED on 5/25 for a HA. MRI and CT head were neg. He is no longer having a headache. No balance issues. Sometimes doesn't sleep well but that is rare. He does not wish to start a medicine for that. Lab work was normal in the ED.   Past Medical History:  Diagnosis Date   Anxiety    At risk for sleep apnea    STOP-BANG= 5     SENT TO PCP 11-29-2013   Cataract    Chronic fatigue    DJD (degenerative joint disease)    RIGHT KNEE   History of colon polyps    2011  &  2013 --  ADENOMATOUS   History of pulmonary embolism    Hyperlipidemia    pt denies ever having high chol   Hypertension    Right knee meniscal tear    Ulcerative colitis     Objective: BP 128/68 (BP Location: Left Arm, Patient Position: Sitting)   Pulse 93   Temp 98 F (36.7 C) (Oral)   Resp 16   Ht 6\' 1"  (1.854 m)   Wt 246 lb (111.6 kg)   SpO2 98%   BMI 32.46 kg/m  General: Awake, appears stated age Heart: RRR Neuro: Gait is nml for him, DTR's equal and symmetric in LE's, no clonus Lungs: CTAB, no rales, wheezes or rhonchi. No accessory muscle use Psych: Age appropriate judgment and insight, normal affect and mood  Assessment and Plan: Acute nonintractable headache, unspecified headache type  Resolved. Reviewed imaging and labs with patient. Discussed intermittent insomnia. Sleep hygiene info provided. Politely declined med. F/u as originally scheduled.  Tdap rec'd to get at the pharmacy at his convenience.  The patient voiced understanding and agreement to the plan.  I spent 21 min w the pt discussing the above plan in addition to reviewing his chart on the same day of the visit.   Shellie Dials [place], DO 10/20/23  11:14 AM

## 2023-11-03 ENCOUNTER — Telehealth: Payer: Self-pay | Admitting: Internal Medicine

## 2023-11-03 NOTE — Telephone Encounter (Signed)
 PT is calling to update his pharmacy request. He would like all [facility]. He also wants Lomotil  to go back to the regular dosage of 2 tablets 3 times a day. The lower dosage he is currently on is not as effective. Please advise.

## 2023-11-04 ENCOUNTER — Ambulatory Visit

## 2023-11-04 ENCOUNTER — Telehealth: Payer: Self-pay

## 2023-11-04 MED ORDER — DIPHENOXYLATE-ATROPINE 2.5-0.025 MG PO TABS: 2.0000 | ORAL_TABLET | Freq: Three times a day (TID) | ORAL | 3 refills | Status: DC

## 2023-11-04 NOTE — Telephone Encounter (Signed)
Okay to change as requested. 

## 2023-11-04 NOTE — Telephone Encounter (Signed)
[place] to change this?

## 2023-11-04 NOTE — Telephone Encounter (Signed)
 Unsuccessful attempts to reach patient on preferred number listed in notes for scheduled AWV. Left message on voicemail okay to reschedule.

## 2023-11-04 NOTE — Telephone Encounter (Signed)
Lomotil refilled °

## 2023-11-05 ENCOUNTER — Telehealth: Payer: Self-pay | Admitting: Family Medicine

## 2023-11-05 NOTE — Telephone Encounter (Signed)
 Copied from CRM [id number]. Topic: Appointments - Appointment Cancel/Reschedule >> Nov 04, 2023 10:25 AM Bill Fox wrote: Patient missed annual wellness visit phone call today at 10:10am, please call him to reschedule at [phone]. The schedule is booking into June of next year.

## 2023-11-05 NOTE — Telephone Encounter (Signed)
 Attempted to reach pt. Left message for him to return my call to schedule AWV. Ok to schedule him with PCP office on wellness visit 1 schedule.

## 2023-11-07 ENCOUNTER — Ambulatory Visit (INDEPENDENT_AMBULATORY_CARE_PROVIDER_SITE_OTHER): Admitting: *Deleted

## 2023-11-07 VITALS — Ht 72.0 in | Wt 232.0 lb

## 2023-11-07 DIAGNOSIS — Z0000 Encounter for general adult medical examination without abnormal findings: Secondary | ICD-10-CM | POA: Diagnosis not present

## 2023-11-07 MED ORDER — DIPHENOXYLATE-ATROPINE 2.5-0.025 MG PO TABS: 2.0000 | ORAL_TABLET | Freq: Three times a day (TID) | ORAL | 3 refills | Status: DC

## 2023-11-07 NOTE — Telephone Encounter (Signed)
 Lm on vm that I faxed Lomotil  prescription as requested.  Asked that he call back and clarify the correct pharmacy and I will fax it again.

## 2023-11-07 NOTE — Telephone Encounter (Signed)
 PT confirmed that [facility] [place]

## 2023-11-07 NOTE — Telephone Encounter (Signed)
 [facility] [place]

## 2023-11-07 NOTE — Progress Notes (Signed)
 Subjective:   Bill Fox is a 83 y.o. who presents for a Medicare Wellness preventive visit.  As a reminder, Annual Wellness Visits don't include a physical exam, and some assessments may be limited, especially if this visit is performed virtually. We may recommend an in-person follow-up visit with your provider if needed.  Visit Complete: Virtual I connected with  [name] on 11/07/23 by a audio enabled telemedicine application and verified that I am speaking with the correct person using two identifiers.  Patient Location: Home  Provider Location: Office/Clinic  I discussed the limitations of evaluation and management by telemedicine. The patient expressed understanding and agreed to proceed.  Vital Signs: Because this visit was a virtual/telehealth visit, some criteria may be missing or patient reported. Any vitals not documented were not able to be obtained and vitals that have been documented are patient reported.  VideoDeclined- This patient declined Librarian, academic. Therefore the visit was completed with audio only.  Persons Participating in Visit: Patient.  AWV Questionnaire: No: Patient Medicare AWV questionnaire was not completed prior to this visit.  Cardiac Risk Factors include: dyslipidemia;hypertension;male gender;advanced age (>32men, >61 women);Other (see comment), Risk factor comments: MI     Objective:    Today's Vitals   11/07/23 1058  Weight: 232 lb (105.2 kg)  Height: 6' (1.829 m)   Body mass index is 31.46 kg/m.     11/07/2023   11:18 AM 10/12/2023    5:07 PM 10/30/2022    1:49 PM 03/17/2022    6:28 PM 10/18/2021    3:10 PM 11/21/2020   10:19 AM 11/20/2020    2:13 PM  Advanced Directives  Does Patient Have a Medical Advance Directive? No No No No Yes No No  Type of Agricultural consultant;Out of facility DNR (pink MOST or yellow form);Living will    Does patient want to make changes  to medical advance directive?     No - Patient declined    Copy of Healthcare Power of Attorney in Chart?     No - copy requested    Would patient like information on creating a medical advance directive? No - Patient declined  No - Patient declined No - Patient declined  No - Patient declined     Current Medications (verified) Outpatient Encounter Medications as of 11/07/2023  Medication Sig   apixaban  (ELIQUIS ) 5 MG TABS tablet Take 1 tablet (5 mg total) by mouth 2 (two) times daily.   diphenoxylate -atropine  (LOMOTIL ) 2.5-0.025 MG tablet Take 2 tablets by mouth in the morning, at noon, and at bedtime.   doxazosin  (CARDURA ) 8 MG tablet TAKE ONE-HALF (1/2) TABLET AT BEDTIME   furosemide  (LASIX ) 40 MG tablet Take 1 tablet (40 mg total) by mouth 2 (two) times daily.   Iron , Ferrous Sulfate , 325 (65 Fe) MG TABS Take 325 mg by mouth daily.   meclizine  (ANTIVERT ) 12.5 MG tablet Take 1 tablet (12.5 mg total) by mouth 3 (three) times daily as needed for dizziness.   mirtazapine  (REMERON ) 30 MG tablet Take 1 tablet (30 mg total) by mouth at bedtime.   potassium chloride  (KLOR-CON  10) 10 MEQ tablet Take 2 tabs with every Lasix  dose.   rosuvastatin  (CRESTOR ) 10 MG tablet Take 1 tablet (10 mg total) by mouth daily. Pt must schedule appt with provider for further refills - 1st attempt   sildenafil  (VIAGRA ) 100 MG tablet Take 0.5-1 tablets (50-100 mg total) by mouth  daily as needed for erectile dysfunction.   [DISCONTINUED] triamcinolone  ointment (KENALOG ) 0.5 % Apply 1 Application topically 2 (two) times daily. (Patient not taking: Reported on 11/07/2023)   No facility-administered encounter medications on file as of 11/07/2023.    Allergies (verified) Patient has no known allergies.   History: Past Medical History:  Diagnosis Date   Anxiety    At risk for sleep apnea    STOP-BANG= 5     SENT TO PCP 11-29-2013   Cataract    Chronic fatigue    DJD (degenerative joint disease)    RIGHT KNEE    History of colon polyps    2011  &  2013 --  ADENOMATOUS   History of pulmonary embolism    Hyperlipidemia    pt denies ever having high chol   Hypertension    Right knee meniscal tear    Ulcerative colitis    Past Surgical History:  Procedure Laterality Date   CATARACT EXTRACTION W/ INTRAOCULAR LENS IMPLANT Left 2014   COLONOSCOPY W/ POLYPECTOMY  MULTIPLE--  LAST ONE 09-12-2011   KNEE ARTHROSCOPY WITH DRILLING/MICROFRACTURE Right 12/03/2013   Procedure: RIGHT KNEE ARTHROSCOPY PARTIAL MEDIAL MENISCECTOMY DEBRIDEMENT SYNOVECTOMY;  Surgeon: Loel Ring, MD;  Location: [facility];  Service: Orthopedics;  Laterality: Right;   NASAL FRACTURE SURGERY  1985   TRANSTHORACIC ECHOCARDIOGRAM  11-01-2008   MILD LVH/  EF 55%/  GRADE I DIASTOLIC DYSFUNCTION/  MILD LAE   Family History  Problem Relation Age of Onset   Diabetes Mother    Stomach cancer Father    Lung cancer Brother    Diabetes Paternal Uncle        x 2   Colon cancer Maternal Uncle 47   Pancreatic cancer Neg Hx    Esophageal cancer Neg Hx    Colon polyps Neg Hx    Social History   Socioeconomic History   Marital status: Legally Separated    Spouse name: Not on file   Number of children: Not on file   Years of education: Not on file   Highest education level: Not on file  Occupational History   Occupation: retired  Tobacco Use   Smoking status: Former    Current packs/day: 0.00    Average packs/day: 1 pack/day for 30.0 years (30.0 ttl pk-yrs)    Types: Cigarettes    Start date: 05/21/1963    Quit date: 05/20/1993    Years since quitting: 30.4   Smokeless tobacco: Never  Vaping Use   Vaping status: Never Used  Substance and Sexual Activity   Alcohol use: Not Currently    Comment: no alcohol in 1 yr   Drug use: No   Sexual activity: Yes  Other Topics Concern   Not on file  Social History Narrative   Not on file   Social Drivers of Health   Financial Resource Strain: Low Risk  (11/07/2023)    Overall Financial Resource Strain (CARDIA)    Difficulty of Paying Living Expenses: Not hard at all  Food Insecurity: No Food Insecurity (11/07/2023)   Hunger Vital Sign    Worried About Running Out of Food in the Last Year: Never true    Ran Out of Food in the Last Year: Never true  Transportation Needs: No Transportation Needs (11/07/2023)   PRAPARE - Administrator, Civil Service (Medical): No    Lack of Transportation (Non-Medical): No  Physical Activity: Sufficiently Active (11/07/2023)   Exercise Vital Sign  Days of Exercise per Week: 7 days    Minutes of Exercise per Session: 40 min  Stress: No Stress Concern Present (11/07/2023)   [employer] of Occupational Health - Occupational Stress Questionnaire    Feeling of Stress: Not at all  Social Connections: Moderately Integrated (11/07/2023)   Social Connection and Isolation Panel    Frequency of Communication with Friends and Family: More than three times a week    Frequency of Social Gatherings with Friends and Family: Once a week    Attends Religious Services: More than 4 times per year    Active Member of [employer] or Organizations: Yes    Attends Engineer, structural: More than 4 times per year    Marital Status: Separated    Tobacco Counseling Counseling given: Not Answered    Clinical Intake:  Pre-visit preparation completed: Yes  Pain : No/denies pain     BMI - recorded: 33.36 Nutritional Status: BMI > 30  Obese Nutritional Risks: Other (Comment) Diabetes: No  No results found for: HGBA1C   How often do you need to have someone help you when you read instructions, pamphlets, or other written materials from your doctor or pharmacy?: 1 - Never What is the last grade level you completed in school?: 12th grade  Interpreter Needed?: No  Information entered by :: Susa Engman, CMA   Activities of Daily Living     11/07/2023   11:08 AM  In your present state of health, do you  have any difficulty performing the following activities:  Hearing? 0  Vision? 0  Difficulty concentrating or making decisions? 1  Walking or climbing stairs? 0  Dressing or bathing? 0  Doing errands, shopping? 0  Preparing Food and eating ? N  Using the Toilet? N  In the past six months, have you accidently leaked urine? N  Do you have problems with loss of bowel control? N  Managing your Medications? N  Managing your Finances? N  Housekeeping or managing your Housekeeping? N    Patient Care Team: Jobe Mulder, DO as PCP - General (Family Medicine) Croitoru, Karyl Paget, MD as PCP - Cardiology (Cardiology) [facility] (Ophthalmology)  I have updated your Care Teams any recent Medical Services you may have received from other providers in the past year.     Assessment:   This is a routine wellness examination for Taige.  Hearing/Vision screen Hearing Screening - Comments:: Denies hearing difficulties.  Vision Screening - Comments:: Wears RX glasses -- up to date with routine eye exams. Appt next week, [facility]   Goals Addressed             This Visit's Progress    Patient Stated   On track    Drink more water       Depression Screen     11/07/2023   11:16 AM 10/30/2022    1:57 PM 10/18/2021    3:14 PM 10/12/2020    2:52 PM 09/09/2019    9:39 AM 09/03/2018    9:18 AM 09/01/2017    9:32 AM  PHQ 2/9 Scores  PHQ - 2 Score 0 0 0 0 0 0 0  PHQ- 9 Score 0          Fall Risk     11/07/2023   11:06 AM 10/30/2022    1:57 PM 06/11/2022   11:56 AM 10/18/2021    3:12 PM 10/12/2020    2:51 PM  Fall Risk  Falls in the past year? 0 0 0 1 0  Number falls in past yr: 0 0 0 0 0  Injury with Fall? 0 0 0 0 0  Risk for fall due to : No Fall Risks No Fall Risks Impaired mobility History of fall(s)   Follow up Falls evaluation completed Falls evaluation completed  Falls evaluation completed  Falls prevention discussed      Data saved with a previous  flowsheet row definition    MEDICARE RISK AT HOME:  Medicare Risk at Home Any stairs in or around the home?: Yes If so, are there any without handrails?: No Home free of loose throw rugs in walkways, pet beds, electrical cords, etc?: Yes Adequate lighting in your home to reduce risk of falls?: Yes Life alert?: No Use of a cane, walker or w/c?: No Grab bars in the bathroom?: No Shower chair or bench in shower?: Yes Elevated toilet seat or a handicapped toilet?: No  TIMED UP AND GO:  Was the test performed?  No, audio  Cognitive Function: 6CIT completed    09/01/2017    9:33 AM  MMSE - Mini Mental State Exam  Orientation to time 5  Orientation to Place 5  Registration 3  Attention/ Calculation 5  Recall 3  Language- name 2 objects 2  Language- repeat 1  Language- follow 3 step command 3  Language- read & follow direction 1  Write a sentence 1  Copy design 1  Total score 30        11/07/2023   11:18 AM 10/30/2022    2:06 PM 10/18/2021    3:35 PM 10/18/2021    3:22 PM  6CIT Screen  What Year? 0 points 0 points 0 points 0 points  What month? 0 points 0 points 0 points 0 points  What time? 0 points 3 points 0 points 0 points  Count back from 20 0 points 0 points 0 points 0 points  Months in reverse 0 points 0 points 0 points 0 points  Repeat phrase 0 points 2 points 10 points 10 points  Total Score 0 points 5 points 10 points 10 points    Immunizations Immunization History  Administered Date(s) Administered   Influenza Split 02/18/2016   Influenza Whole 02/16/2009, 01/31/2010   Influenza, High Dose Seasonal PF 03/11/2018, 01/28/2022, 02/05/2023   Influenza,inj,Quad PF,6+ Mos 03/03/2014, 01/30/2015   Influenza-Unspecified 03/12/2021   Janssen (J&J) SARS-COV-2 Vaccination 07/29/2019   Moderna Covid-19 Fall Seasonal Vaccine 5yrs & older 05/27/2022, 01/28/2023   Moderna Covid-19 Vaccine  Bivalent Booster 2yrs & up 03/02/2021   Moderna Sars-Covid-2 Vaccination  05/22/2020, 11/19/2020   Pneumococcal Conjugate-13 01/30/2015   Pneumococcal Polysaccharide-23 06/20/2008   Tdap 09/03/2011, 10/20/2023    Screening Tests Health Maintenance  Topic Date Due   Zoster Vaccines- Shingrix (1 of 2) Never done   Medicare Annual Wellness (AWV)  10/30/2023   COVID-19 Vaccine (7 - 2024-25 season) 05/19/2024 (Originally 07/28/2023)   INFLUENZA VACCINE  12/19/2023   Colonoscopy  02/26/2026   DTaP/Tdap/Td (3 - Td or Tdap) 10/19/2033   Pneumococcal Vaccine: 50+ Years  Completed   HPV VACCINES  Aged Out   Meningococcal B Vaccine  Aged Out   Hepatitis C Screening  Discontinued    Health Maintenance  Health Maintenance Due  Topic Date Due   Zoster Vaccines- Shingrix (1 of 2) Never done   Medicare Annual Wellness (AWV)  10/30/2023   Health Maintenance Items Addressed: Will get Shingrix at local pharmacy.  Additional Screening:  Vision Screening: Recommended annual ophthalmology exams for early detection of glaucoma and other disorders of the eye. Would you like a referral to an eye doctor? No    Dental Screening: Recommended annual dental exams for proper oral hygiene  Community Resource Referral / Chronic Care Management: CRR required this visit?  No   CCM required this visit?  No   Plan:    I have personally reviewed and noted the following in the patient's chart:   Medical and social history Use of alcohol, tobacco or illicit drugs  Current medications and supplements including opioid prescriptions. Patient is not currently taking opioid prescriptions. Functional ability and status Nutritional status Physical activity Advanced directives List of other physicians Hospitalizations, surgeries, and ER visits in previous 12 months Vitals Screenings to include cognitive, depression, and falls Referrals and appointments  In addition, I have reviewed and discussed with patient certain preventive protocols, quality metrics, and best practice  recommendations. A written personalized care plan for preventive services as well as general preventive health recommendations were provided to patient.   Susa Engman, CMA   11/07/2023   After Visit Summary: (Mail) Due to this being a telephonic visit, the after visit summary with patients personalized plan was offered to patient via mail   Notes: Nothing significant to report at this time.

## 2023-11-07 NOTE — Telephone Encounter (Signed)
 Patient called and stated that he has still no received his medication for Lomotil . Patient is requesting a call back. Please advise.

## 2023-11-07 NOTE — Addendum Note (Signed)
 Addended by: Berl Bonfanti K on: 11/07/2023 01:56 PM   Modules accepted: Orders

## 2023-11-07 NOTE — Patient Instructions (Signed)
 Mr. Branscome , Thank you for taking time out of your busy schedule to complete your Annual Wellness Visit with me. I enjoyed our conversation and look forward to speaking with you again next year. I, as well as your care team,  appreciate your ongoing commitment to your health goals. Please review the following plan we discussed and let me know if I can assist you in the future. Your Game plan/ To Do List     Follow up Visits:  Next Medicare AWV with our clinical staff: 11/12/24 11am    telephone call.                   Next Office Visit with your provider: 02/11/24 11am in person  Clinician Recommendations:  Aim for 30 minutes of exercise or brisk walking, 6-8 glasses of water, and 5 servings of fruits and vegetables each day.    Please get the Shingrix (shingles) vaccines at your local pharmacy.   This is a list of the screening recommended for you and due dates:  Health Maintenance  Topic Date Due   Zoster (Shingles) Vaccine (1 of 2) Never done   DTaP/Tdap/Td vaccine (2 - Td or Tdap) 09/02/2021   Medicare Annual Wellness Visit  10/30/2023   COVID-19 Vaccine (5 - 2024-25 season) 05/19/2024*   Flu Shot  12/19/2023   Colon Cancer Screening  02/26/2026   Pneumococcal Vaccine for age over 34  Completed   HPV Vaccine  Aged Out   Meningitis B Vaccine  Aged Out   Hepatitis C Screening  Discontinued  *Topic was postponed. The date shown is not the original due date.    Advanced directives: (Copy Requested) Please bring a copy of your health care power of attorney and living will to the office to be added to your chart at your convenience. You can mail to [facility] [address] [place], [place] [address] or email to [email or web address] Advance Care Planning is important because it:  [x]  Makes sure you receive the medical care that is consistent with your values, goals, and preferences  [x]  It provides guidance to your family and loved ones and reduces their  decisional burden about whether or not they are making the right decisions based on your wishes.  Follow the link provided in your after visit summary or read over the paperwork we have mailed to you to help you started getting your Advance Directives in place. If you need assistance in completing these, please reach out to us  so that we can help you!  See attachments for Preventive Care and Fall Prevention Tips.

## 2023-11-10 ENCOUNTER — Other Ambulatory Visit (INDEPENDENT_AMBULATORY_CARE_PROVIDER_SITE_OTHER)

## 2023-11-10 ENCOUNTER — Ambulatory Visit: Payer: Self-pay | Admitting: Family Medicine

## 2023-11-10 DIAGNOSIS — D649 Anemia, unspecified: Secondary | ICD-10-CM | POA: Diagnosis not present

## 2023-11-10 LAB — IBC + FERRITIN
Ferritin: 36.7 ng/mL (ref 22.0–322.0)
Iron: 51 ug/dL (ref 42–165)
Saturation Ratios: 18.3 % — ABNORMAL LOW (ref 20.0–50.0)
TIBC: 278.6 ug/dL (ref 250.0–450.0)
Transferrin: 199 mg/dL — ABNORMAL LOW (ref 212.0–360.0)

## 2023-11-11 ENCOUNTER — Other Ambulatory Visit: Payer: Self-pay

## 2023-11-11 DIAGNOSIS — D649 Anemia, unspecified: Secondary | ICD-10-CM

## 2023-12-01 ENCOUNTER — Telehealth: Payer: Self-pay | Admitting: Internal Medicine

## 2023-12-01 NOTE — Telephone Encounter (Signed)
 Inbound call from patient requesting a refill for mesalamine  medication. Please advise, thank you.

## 2023-12-02 NOTE — Telephone Encounter (Signed)
 Inbound call from patient states he is returning that call he missed. Requesting a call back   Please advise  Thank you

## 2023-12-02 NOTE — Telephone Encounter (Signed)
PT is returning call. Please advise.  

## 2023-12-02 NOTE — Telephone Encounter (Signed)
 Lm on vm

## 2023-12-03 NOTE — Telephone Encounter (Signed)
 1.  Refill Apriso  as previous 2.  If he is not doing well, get him an office appointment.

## 2023-12-03 NOTE — Telephone Encounter (Signed)
 Patient was requesting a refill of his mesalamine , which is not showing in his medlist or mentioned in his last office note.  The last time I see one is Apriso , which is in his historical medications but looks like it was discontinued.  He claims he is still taking it but he doesn't think it helps.  He does get relief from the Lomotil .  He is a little vague as to why he thinks Apriso  is not working.  I asked if he thought he needed to be switched to another mesalamine  and he said whatever you thought was best.  So I'm not sure what to give him!  Please advise.

## 2023-12-04 NOTE — Telephone Encounter (Signed)
 Lm on vm; need name of pharmacy he wants Apriso  sent to

## 2023-12-05 NOTE — Telephone Encounter (Signed)
 Patient requesting a urgent call back from nurse to discuss his overall health.when asked specifics patient stated he would rater speak with nurse. Please advise, thank you

## 2023-12-05 NOTE — Telephone Encounter (Signed)
 Patient states that he was recently told by our office that he was no longer supposed to be on Apriso  so stopped taking it about 3-4 days ago along with lomotil . Since, he has developed upset stomach/diarrhea. Patient states that he went back on it today and is already feeling somewhat better. Patient is advised that he should CONTINUE Apriso  and should not discontinue unless specifically advised by Dr Abran or his PA/NP to do so. Advised this type of medication is likely something he will take lifelong to keep colitis symptoms controlled. Patient verbalizes understanding. He requests that a refill of Apriso  be sent to [facility]. He currently has about 3 weeks rx left on hand. [facility] his request.  Additionally, patient has been scheduled to see Ellouise Console, PA-C in follow up on 12/26/23 as recommended by Dr Abran.   Patient is advised that should he develop worsening symptoms following reintroduction of Apriso , he should make us  aware as he may need additional treatment. He verbalizes understanding.

## 2023-12-05 NOTE — Telephone Encounter (Signed)
 Inbound call from patient stating name of pharmacy is [facility]. Patient also stated he would like to be scheduled for an office visit but Dr.Perry seems to be booked for September and patient does not wish to see a PA.  Requesting a call back if theres anything sooner since patient states medication is not helping   Please advise  Thank you

## 2023-12-11 ENCOUNTER — Ambulatory Visit: Payer: Self-pay | Admitting: Family Medicine

## 2023-12-11 ENCOUNTER — Other Ambulatory Visit (INDEPENDENT_AMBULATORY_CARE_PROVIDER_SITE_OTHER)

## 2023-12-11 ENCOUNTER — Ambulatory Visit: Payer: Self-pay

## 2023-12-11 ENCOUNTER — Telehealth: Payer: Self-pay

## 2023-12-11 DIAGNOSIS — D649 Anemia, unspecified: Secondary | ICD-10-CM

## 2023-12-11 LAB — IBC PANEL
Iron: 27 ug/dL — ABNORMAL LOW (ref 42–165)
Saturation Ratios: 9.8 % — ABNORMAL LOW (ref 20.0–50.0)
TIBC: 274.4 ug/dL (ref 250.0–450.0)
Transferrin: 196 mg/dL — ABNORMAL LOW (ref 212.0–360.0)

## 2023-12-11 MED ORDER — APIXABAN 5 MG PO TABS: 5.0000 mg | ORAL_TABLET | Freq: Two times a day (BID) | ORAL | 0 refills | Status: DC

## 2023-12-11 NOTE — Telephone Encounter (Signed)
 Pt called and medication sent to [facility] in [place]

## 2023-12-11 NOTE — Telephone Encounter (Signed)
 Pt states that he only has one pill of his Eliquis  left and has to take it tonight and then he will be out. States [facility] and he called them today and they stated it was a mix up and they will get his medication shipped over night. But he is requesting samples of the medication to get him until he get his shipment from [facility].

## 2023-12-11 NOTE — Telephone Encounter (Signed)
 FYI Only or Action Required?: Action required by provider: samples of Eliquis .  Patient was last seen in primary care on 10/20/2023 by Frann Mabel Mt, DO.  Called Nurse Triage reporting Medication Refill.  Symptoms began today.  Interventions attempted: Nothing.  Symptoms are: unchanged.  Triage Disposition: Call PCP Now  Patient/caregiver understands and will follow disposition?: No, wishes to speak with PCP       Copied from CRM #[id number]. Topic: Clinical - Red Word Triage >> Dec 11, 2023  2:56 PM [place] J wrote: [facility] that prompted transfer to Nurse Triage: Patient is out of eliquis  and the office never got back to him in regard to samples. Reason for Disposition  [1] Prescription refill request for ESSENTIAL medicine (i.e., likelihood of harm to patient if not taken) AND [2] triager unable to refill per department policy  Answer Assessment - Initial Assessment Questions 1. DRUG NAME: What medicine do you need to have refilled?     Eliquis   2. REFILLS REMAINING: How many refills are remaining? Notes: The label on the medicine or pill bottle will show how many refills are remaining. If there are no refills remaining, then a renewal may be needed.     [facility]  4. PRESCRIBER: Who prescribed it? Note: The prescribing doctor or group is responsible for refill approvals..     Dr. frann 5. PHARMACY: Have you contacted your pharmacy (drugstore)? Note: Some pharmacies will contact the doctor (or NP/PA).      [facility]  Protocols used: Medication Refill and Renewal Call-A-AH

## 2023-12-11 NOTE — Telephone Encounter (Signed)
 Copied from CRM [id number]. Topic: Clinical - Medication Question >> Dec 11, 2023 12:56 PM Mercedes MATSU wrote: Reason for CRM: Patient called in requesting a sample of Eliquis  due to his medication not being ready for 3 days. Patient wants to know if he can stop the office for the samples. Patient can be reached at [phone].

## 2023-12-11 NOTE — Telephone Encounter (Signed)
 Pt called and left voicemail on house phone , samples left up front for pt Copied from CRM #[id number]. Topic: Clinical - Prescription Issue >> Dec 11, 2023  3:48 PM [place] wrote: Reason for CRM: [facility] [place] and [facility] will not allow him to get the 7day supply. They are making him pay for a whole month, Wanting to know if there are any samples he can get, please reach out to patient as soon as possible, he is out of the medication.

## 2023-12-11 NOTE — Telephone Encounter (Signed)
 Pt called back. Stated he is completely out of rx.

## 2023-12-11 NOTE — Telephone Encounter (Signed)
 [facility] [place]

## 2023-12-11 NOTE — Addendum Note (Signed)
 Addended by: DORLENE CHIQUITA RAMAN on: 12/11/2023 03:18 PM   Modules accepted: Orders

## 2023-12-12 ENCOUNTER — Other Ambulatory Visit: Payer: Self-pay

## 2023-12-12 MED ORDER — IRON (FERROUS SULFATE) 325 (65 FE) MG PO TABS: 325.0000 mg | ORAL_TABLET | Freq: Every day | ORAL | 2 refills | Status: DC

## 2023-12-15 ENCOUNTER — Telehealth: Payer: Self-pay | Admitting: Family Medicine

## 2023-12-15 NOTE — Telephone Encounter (Signed)
 Lvm notifying pt to keep

## 2023-12-15 NOTE — Telephone Encounter (Signed)
 Copied from CRM [id number]. Topic: Clinical - Request for Lab/Test Order >> Dec 15, 2023  8:52 AM Viola FALCON wrote: Reason for CRM: Patient called to let Dr. Konrad nurse know that he scheduled his lab appt for 01/09/24 to recheck iron  levels

## 2023-12-15 NOTE — Telephone Encounter (Signed)
 Copied from CRM [id number]. Topic: General - Call Back - No Documentation >> Dec 15, 2023 10:48 AM Rea BROCKS wrote: Reason for CRM: Patient is calling to ask if he should keep his lab appointment at 8/22 or change it to a further date to make it exactly 30 days. He said he doesn't want to mess up his labs again. Also, he said that he called in his last refill for his iron  pills.   Patient would appreciate a call back.   [phone] BENNIE)

## 2023-12-25 NOTE — Progress Notes (Deleted)
 Ellouise Console, PA-C [address] [place], [place]  [address] Phone: [phone]   Primary Care Physician: Frann Mabel Mt, DO  Primary Gastroenterologist:  Ellouise Console, PA-C / Norleen Kiang, MD   Chief Complaint:  F/U chronic left side ulcerative Colitis       HPI:   Bill Fox is a 83 y.o. male, established patient of Dr. Kiang, returns for 51-month follow-up of chronic left-sided ulcerative colitis.  He last saw Dr. Kiang 05/2023 and was feeling better after steroid taper.    October 2024: Steroid taper for UC flare. December 2025: Steroid taper for UC flare plus Lomotil  2 tabs 3 times daily.  02/2023 colonoscopy: Ulcerated mucosa from the rectum to descending colon (50 cm).  No polyps or dysplasia.  Biopsy showed mild active colitis.  05/2020 colonoscopy: Moderately active ulcerative colitis in the rectum and sigmoid colon (40 cm).  No polyps.  Also had colonoscopies in 2019 (no active colitis), 2016, 2013, 2011, 2009, 2007, 2005, 2002, 2000, 1996.  PMH: Hx pulmonary embolism on Eliquis .  Quit smoking 30 years ago.  Current Outpatient Medications  Medication Sig Dispense Refill   apixaban  (ELIQUIS ) 5 MG TABS tablet Take 1 tablet (5 mg total) by mouth 2 (two) times daily for 7 days. 14 tablet 0   diphenoxylate -atropine  (LOMOTIL ) 2.5-0.025 MG tablet Take 2 tablets by mouth in the morning, at noon, and at bedtime. 180 tablet 3   doxazosin  (CARDURA ) 8 MG tablet TAKE ONE-HALF (1/2) TABLET AT BEDTIME 45 tablet 3   furosemide  (LASIX ) 40 MG tablet Take 1 tablet (40 mg total) by mouth 2 (two) times daily. 180 tablet 2   Iron , Ferrous Sulfate , 325 (65 Fe) MG TABS Take 325 mg by mouth daily. 30 tablet 2   meclizine  (ANTIVERT ) 12.5 MG tablet Take 1 tablet (12.5 mg total) by mouth 3 (three) times daily as needed for dizziness. 30 tablet 0   mirtazapine  (REMERON ) 30 MG tablet Take 1 tablet (30 mg total) by mouth at bedtime. 90 tablet 3   potassium chloride  (KLOR-CON  10)  10 MEQ tablet Take 2 tabs with every Lasix  dose. 360 tablet 2   rosuvastatin  (CRESTOR ) 10 MG tablet Take 1 tablet (10 mg total) by mouth daily. Pt must schedule appt with provider for further refills - 1st attempt 90 tablet 3   sildenafil  (VIAGRA ) 100 MG tablet Take 0.5-1 tablets (50-100 mg total) by mouth daily as needed for erectile dysfunction. 10 tablet 3   No current facility-administered medications for this visit.    Allergies as of 12/26/2023   (No Known Allergies)    Past Medical History:  Diagnosis Date   Anxiety    At risk for sleep apnea    STOP-BANG= 5     SENT TO PCP 11-29-2013   Cataract    Chronic fatigue    DJD (degenerative joint disease)    RIGHT KNEE   History of colon polyps    2011  &  2013 --  ADENOMATOUS   History of pulmonary embolism    Hyperlipidemia    pt denies ever having high chol   Hypertension    Right knee meniscal tear    Ulcerative colitis     Past Surgical History:  Procedure Laterality Date   CATARACT EXTRACTION W/ INTRAOCULAR LENS IMPLANT Left 2014   COLONOSCOPY W/ POLYPECTOMY  MULTIPLE--  LAST ONE 09-12-2011   KNEE ARTHROSCOPY WITH DRILLING/MICROFRACTURE Right 12/03/2013   Procedure: RIGHT KNEE ARTHROSCOPY PARTIAL MEDIAL  MENISCECTOMY DEBRIDEMENT SYNOVECTOMY;  Surgeon: Reyes JAYSON Billing, MD;  Location: [facility];  Service: Orthopedics;  Laterality: Right;   NASAL FRACTURE SURGERY  1985   TRANSTHORACIC ECHOCARDIOGRAM  11-01-2008   MILD LVH/  EF 55%/  GRADE I DIASTOLIC DYSFUNCTION/  MILD LAE    Review of Systems:    All systems reviewed and negative except where noted in HPI.    Physical Exam:  There were no vitals taken for this visit. No LMP for male patient.  General: Well-nourished, well-developed in no acute distress.  Lungs: Clear to auscultation bilaterally. Non-labored. Heart: Regular rate and rhythm, no murmurs rubs or gallops.  Abdomen: Bowel sounds are normal; Abdomen is Soft; No hepatosplenomegaly,  masses or hernias;  No Abdominal Tenderness; No guarding or rebound tenderness. Neuro: Alert and oriented x 3.  Grossly intact.  Psych: Alert and cooperative, normal mood and affect.   Imaging Studies: No results found.  Labs: CBC    Component Value Date/Time   WBC 8.5 10/12/2023 1840   RBC 5.14 10/12/2023 1840   HGB 13.5 10/12/2023 1840   HCT 44.4 10/12/2023 1840   PLT 255 10/12/2023 1840   MCV 86.4 10/12/2023 1840   MCH 26.3 10/12/2023 1840   MCHC 30.4 10/12/2023 1840   RDW 15.9 (H) 10/12/2023 1840   LYMPHSABS 1.9 11/27/2022 1039   MONOABS 0.9 11/27/2022 1039   EOSABS 0.2 11/27/2022 1039   BASOSABS 0.1 11/27/2022 1039    CMP     Component Value Date/Time   NA 139 10/12/2023 1840   K 3.8 10/12/2023 1840   CL 105 10/12/2023 1840   CO2 23 10/12/2023 1840   GLUCOSE 114 (H) 10/12/2023 1840   BUN 17 10/12/2023 1840   CREATININE 1.31 (H) 10/12/2023 1840   CALCIUM  8.7 (L) 10/12/2023 1840   PROT 7.8 10/12/2023 1840   ALBUMIN 3.7 10/12/2023 1840   AST 18 10/12/2023 1840   ALT 13 10/12/2023 1840   ALKPHOS 56 10/12/2023 1840   BILITOT 0.2 10/12/2023 1840   GFRNONAA 55 (L) 10/12/2023 1840   GFRAA >60 07/12/2015 9385       Assessment and Plan:   Bill Fox is a 83 y.o. y/o male returns for follow-up of chronic left-sided ulcerative colitis for over 20 years.  Last colonoscopy 02/2023 showed no polyps and no dysplasia.  He has been treated with steroid tapers during flareups.  1.  Left-sided ulcerative colitis  2. Iron  Deficiency  Ellouise Console, PA-C  Follow up ***

## 2023-12-26 ENCOUNTER — Ambulatory Visit: Admitting: Physician Assistant

## 2023-12-29 ENCOUNTER — Telehealth: Payer: Self-pay | Admitting: Internal Medicine

## 2023-12-29 NOTE — Telephone Encounter (Signed)
 Inbound call from patient stating he was confused on his appointment date and time and missed 8/8 appointment. Patient is requesting a call to discuss if there is any alternative appointments prior to October. Please advise, thank you

## 2023-12-30 NOTE — Telephone Encounter (Signed)
 Left message for patient to call back

## 2023-12-31 NOTE — Telephone Encounter (Signed)
 Patient has scheduled appointment with Elida Shawl, NP on 01/26/24 for IBD follow up.

## 2024-01-07 ENCOUNTER — Telehealth: Payer: Self-pay | Admitting: Family Medicine

## 2024-01-09 ENCOUNTER — Other Ambulatory Visit

## 2024-01-09 DIAGNOSIS — K51919 Ulcerative colitis, unspecified with unspecified complications: Secondary | ICD-10-CM | POA: Diagnosis not present

## 2024-01-09 DIAGNOSIS — R197 Diarrhea, unspecified: Secondary | ICD-10-CM | POA: Diagnosis not present

## 2024-01-10 LAB — HEPATITIS B CORE ANTIBODY, TOTAL: Hep B Core Total Ab: NONREACTIVE

## 2024-01-10 LAB — HEPATITIS B SURFACE ANTIBODY,QUALITATIVE: Hep B S Ab: NONREACTIVE

## 2024-01-10 LAB — HEPATITIS A ANTIBODY, TOTAL: Hepatitis A AB,Total: REACTIVE — AB

## 2024-01-10 LAB — HEPATITIS B SURFACE ANTIGEN: Hepatitis B Surface Ag: NONREACTIVE

## 2024-01-10 LAB — HEPATITIS A ANTIBODY, IGM: Hep A IgM: NONREACTIVE

## 2024-01-12 NOTE — Telephone Encounter (Signed)
 23

## 2024-01-15 ENCOUNTER — Telehealth: Payer: Self-pay | Admitting: Internal Medicine

## 2024-01-15 NOTE — Telephone Encounter (Signed)
 Inbound call from pt requesting to speak to the nurse in regards to some lab work done. Patient was advised those results were not back as of it but they are still wanting to speak to the nurse. Please advise.

## 2024-01-15 NOTE — Telephone Encounter (Signed)
 Left message for patient to call back

## 2024-01-16 NOTE — Telephone Encounter (Signed)
 Patient states that he forgot why he called. States he does not have any questions right now. He is reminded that he does have a scheduled appointment with Dr Abran on 02/04/24 at 11:40 am that he should keep. He verbalizes understanding.

## 2024-01-20 LAB — QUANTIFERON-TB GOLD PLUS
QuantiFERON Mitogen Value: >10 [IU]/mL
QuantiFERON Nil Value: 0.05 [IU]/mL
QuantiFERON TB1 Ag Value: 0.07 [IU]/mL
QuantiFERON TB2 Ag Value: 0.05 [IU]/mL
QuantiFERON-TB Gold Plus: NEGATIVE

## 2024-01-20 LAB — THIOPURINE METHYLTRANSFERASE (TPMT), RBC: TPMT Activity:: 30.6 U/mL{RBCs}

## 2024-01-26 ENCOUNTER — Ambulatory Visit: Admitting: Nurse Practitioner

## 2024-01-28 ENCOUNTER — Other Ambulatory Visit: Payer: Self-pay | Admitting: Family Medicine

## 2024-01-28 DIAGNOSIS — M7989 Other specified soft tissue disorders: Secondary | ICD-10-CM

## 2024-02-02 NOTE — Telephone Encounter (Signed)
 error

## 2024-02-04 ENCOUNTER — Encounter: Payer: Self-pay | Admitting: Internal Medicine

## 2024-02-04 ENCOUNTER — Ambulatory Visit (INDEPENDENT_AMBULATORY_CARE_PROVIDER_SITE_OTHER): Admitting: Internal Medicine

## 2024-02-04 VITALS — BP 122/70 | HR 81 | Ht 72.0 in | Wt 228.0 lb

## 2024-02-04 DIAGNOSIS — K51919 Ulcerative colitis, unspecified with unspecified complications: Secondary | ICD-10-CM

## 2024-02-04 DIAGNOSIS — K5150 Left sided colitis without complications: Secondary | ICD-10-CM | POA: Diagnosis not present

## 2024-02-04 DIAGNOSIS — R197 Diarrhea, unspecified: Secondary | ICD-10-CM

## 2024-02-04 NOTE — Patient Instructions (Signed)
 Please call back in a couple of weeks to schedule an appointment for the middle of December.  _______________________________________________________  If your blood pressure at your visit was 140/90 or greater, please contact your primary care physician to follow up on this.  _______________________________________________________  If you are age 81 or older, your body mass index should be between 23-30. Your Body mass index is 30.92 kg/m. If this is out of the aforementioned range listed, please consider follow up with your Primary Care Provider.  If you are age 42 or younger, your body mass index should be between 19-25. Your Body mass index is 30.92 kg/m. If this is out of the aformentioned range listed, please consider follow up with your Primary Care Provider.   ________________________________________________________  The [facility] providers would like to encourage you to use MYCHART to communicate with providers for non-urgent requests or questions.  Due to long hold times on the telephone, sending your provider a message by [facility] may be a faster and more efficient way to get a response.  Please allow 48 business hours for a response.  Please remember that this is for non-urgent requests.  _______________________________________________________  [facility] is using a team-based approach to care.  Your team is made up of your doctor and two to three APPS. Our APPS (Nurse Practitioners and Physician Assistants) work with your physician to ensure care continuity for you. They are fully qualified to address your health concerns and develop a treatment plan. They communicate directly with your gastroenterologist to care for you. Seeing the Advanced Practice Practitioners on your physician's team can help you by facilitating care more promptly, often allowing for earlier appointments, access to diagnostic testing, procedures, and other specialty referrals.

## 2024-02-04 NOTE — Progress Notes (Signed)
 HISTORY OF PRESENT ILLNESS:  Bill Fox is a 83 y.o. male with multiple medical problems including a history of pulmonary embolism for which he is on chronic Eliquis .  He is followed in this office for left-sided ulcerative colitis.  He underwent colonoscopy February 27, 2023 and was found to have moderately active left-sided colitis.  No dysplasia on biopsies.  He was treated with prednisone  and seen back in follow-up January 2025.  At that time, he was doing quite well.  He subsequently tapered off prednisone .  He has been off prednisone  for 4 to 5 months.  He continues on Apriso  1.5 g daily.  He uses Lomotil , 1 in the morning and 2 at night.  Currently he describes daily bowel movements.  These are either small formed ball-like stools or loose stools.  Has between 1 and 3 bowel movements per day.  He will occasionally have incontinent spells if he has the urge to go and is not near a bathroom.  No bleeding.  No abdominal pain.  Off prednisone , he has lost weight.  Overall no complaints that are new.  Blood work in anticipation of possible biologic therapy showed negative one of your gold.  Normal methyltransferase metabolism.  Immune to hepatitis B but not hepatitis A.  CBC in May 2025 shows normal hemoglobin 13.5  REVIEW OF SYSTEMS:  All non-GI ROS negative except for arthritis  Past Medical History:  Diagnosis Date   Anxiety    At risk for sleep apnea    STOP-BANG= 5     SENT TO PCP 11-29-2013   Cataract    Chronic fatigue    DJD (degenerative joint disease)    RIGHT KNEE   History of colon polyps    2011  &  2013 --  ADENOMATOUS   History of pulmonary embolism    Hyperlipidemia    pt denies ever having high chol   Hypertension    Right knee meniscal tear    Ulcerative colitis     Past Surgical History:  Procedure Laterality Date   CATARACT EXTRACTION W/ INTRAOCULAR LENS IMPLANT Left 2014   COLONOSCOPY W/ POLYPECTOMY  MULTIPLE--  LAST ONE 09-12-2011   KNEE ARTHROSCOPY WITH  DRILLING/MICROFRACTURE Right 12/03/2013   Procedure: RIGHT KNEE ARTHROSCOPY PARTIAL MEDIAL MENISCECTOMY DEBRIDEMENT SYNOVECTOMY;  Surgeon: Reyes JAYSON Billing, MD;  Location: [facility];  Service: Orthopedics;  Laterality: Right;   NASAL FRACTURE SURGERY  1985   TRANSTHORACIC ECHOCARDIOGRAM  11-01-2008   MILD LVH/  EF 55%/  GRADE I DIASTOLIC DYSFUNCTION/  MILD LAE    Social History ODES LOLLI  reports that he quit smoking about 30 years ago. His smoking use included cigarettes. He started smoking about 60 years ago. He has a 30 pack-year smoking history. He has never used smokeless tobacco. He reports that he does not currently use alcohol. He reports that he does not use drugs.  family history includes Colon cancer (age of onset: 43) in his maternal uncle; Diabetes in his mother and paternal uncle; Lung cancer in his brother; Stomach cancer in his father.  No Known Allergies     PHYSICAL EXAMINATION: Vital signs: BP 122/70   Pulse 81   Ht 6' (1.829 m)   Wt 228 lb (103.4 kg)   BMI 30.92 kg/m  General: Elderly but well-developed, well-nourished, no acute distress HEENT: Sclerae are anicteric, conjunctiva pink. Oral mucosa intact Lungs: Clear Heart: Regular Abdomen: soft, nontender, nondistended, no obvious ascites, no peritoneal signs, normal bowel  sounds. No organomegaly. Extremities: No clubbing, cyanosis, or edema Psychiatric: alert and oriented x3. Cooperative     ASSESSMENT:   1.  Chronic left-sided ulcerative colitis.  Disease under reasonable control on Apriso  (mesalamine ) 1.5 g daily and daily Lomotil  2.  Multiple medical problems.  Stable     PLAN:   1.  Continue Apriso  1.5 g daily 2.  Increase Lomotil  2 p.o. twice daily.  Decrease if constipation develops 3.  Refill Lomotil .  Medication side effects and risks reviewed 4.  Routine office follow-up 3 months A total time of 30 minutes was spent preparing to see the patient, obtaining comprehensive  history, performing medically appropriate physical examination, counseling and educating the patient regarding the above listed issues, ordering medication, arranging follow-up, and documenting clinical information in the health record

## 2024-02-06 ENCOUNTER — Other Ambulatory Visit: Payer: Self-pay | Admitting: Family Medicine

## 2024-02-06 MED ORDER — IRON (FERROUS SULFATE) 325 (65 FE) MG PO TABS: 325.0000 mg | ORAL_TABLET | Freq: Every day | ORAL | 0 refills | Status: DC

## 2024-02-06 NOTE — Telephone Encounter (Signed)
 Copied from CRM [id number]. Topic: Clinical - Medication Refill >> Feb 06, 2024  1:15 PM Paige D wrote: Medication:  Iron , Ferrous Sulfate , 325 (65 Fe) MG TABS  Has the patient contacted their pharmacy? Yes (Agent: If no, request that the patient contact the pharmacy for the refill. If patient does not wish to contact the pharmacy document the reason why and proceed with request.) (Agent: If yes, when and what did the pharmacy advise?)  This is the patient's preferred pharmacy:    [id number] - [place], [place] - [address] [facility] [place] [address] Phone: [phone] Fax: [phone]  Is this the correct pharmacy for this prescription? Yes If no, delete pharmacy and type the correct one.   Has the prescription been filled recently? No  Is the patient out of the medication? No, pt states he has enough for 6 days left   Has the patient been seen for an appointment in the last year OR does the patient have an upcoming appointment? Yes  Can we respond through MyChart? No  Agent: Please be advised that Rx refills may take up to 3 business days. We ask that you follow-up with your pharmacy.

## 2024-02-11 ENCOUNTER — Ambulatory Visit: Payer: Self-pay | Admitting: Family Medicine

## 2024-02-11 ENCOUNTER — Ambulatory Visit: Admitting: Family Medicine

## 2024-02-11 ENCOUNTER — Encounter: Payer: Self-pay | Admitting: Family Medicine

## 2024-02-11 VITALS — BP 110/60 | HR 79 | Temp 98.2°F | Resp 18 | Ht 72.0 in | Wt 225.0 lb

## 2024-02-11 DIAGNOSIS — I10 Essential (primary) hypertension: Secondary | ICD-10-CM | POA: Diagnosis not present

## 2024-02-11 DIAGNOSIS — K51919 Ulcerative colitis, unspecified with unspecified complications: Secondary | ICD-10-CM

## 2024-02-11 DIAGNOSIS — E785 Hyperlipidemia, unspecified: Secondary | ICD-10-CM

## 2024-02-11 LAB — IBC + FERRITIN
Ferritin: 59.4 ng/mL (ref 22.0–322.0)
Iron: 34 ug/dL — ABNORMAL LOW (ref 42–165)
Saturation Ratios: 11.3 % — ABNORMAL LOW (ref 20.0–50.0)
TIBC: 301 ug/dL (ref 250.0–450.0)
Transferrin: 215 mg/dL (ref 212.0–360.0)

## 2024-02-11 LAB — CBC
HCT: 42.7 % (ref 39.0–52.0)
Hemoglobin: 14 g/dL (ref 13.0–17.0)
MCHC: 32.8 g/dL (ref 30.0–36.0)
MCV: 84.2 fl (ref 78.0–100.0)
Platelets: 231 K/uL (ref 150.0–400.0)
RBC: 5.08 Mil/uL (ref 4.22–5.81)
RDW: 14.8 % (ref 11.5–15.5)
WBC: 7.1 K/uL (ref 4.0–10.5)

## 2024-02-11 LAB — LIPID PANEL
Cholesterol: 99 mg/dL (ref 0–200)
HDL: 33.3 mg/dL — ABNORMAL LOW (ref >39.00)
LDL Cholesterol: 47 mg/dL (ref 0–99)
NonHDL: 65.3
Total CHOL/HDL Ratio: 3
Triglycerides: 94 mg/dL (ref 0.0–149.0)
VLDL: 18.8 mg/dL (ref 0.0–40.0)

## 2024-02-11 LAB — COMPREHENSIVE METABOLIC PANEL WITH GFR
ALT: 9 U/L (ref 0–53)
AST: 14 U/L (ref 0–37)
Albumin: 4.2 g/dL (ref 3.5–5.2)
Alkaline Phosphatase: 56 U/L (ref 39–117)
BUN: 17 mg/dL (ref 6–23)
CO2: 25 meq/L (ref 19–32)
Calcium: 8.9 mg/dL (ref 8.4–10.5)
Chloride: 103 meq/L (ref 96–112)
Creatinine, Ser: 1.41 mg/dL (ref 0.40–1.50)
GFR: 46.94 mL/min — ABNORMAL LOW (ref >60.00)
Glucose, Bld: 97 mg/dL (ref 70–99)
Potassium: 3.9 meq/L (ref 3.5–5.1)
Sodium: 139 meq/L (ref 135–145)
Total Bilirubin: 0.5 mg/dL (ref 0.2–1.2)
Total Protein: 7.6 g/dL (ref 6.0–8.3)

## 2024-02-11 MED ORDER — APIXABAN 5 MG PO TABS: 5.0000 mg | ORAL_TABLET | Freq: Two times a day (BID) | ORAL | 2 refills | Status: AC

## 2024-02-11 NOTE — Progress Notes (Signed)
 Chief Complaint  Patient presents with   Follow-up    HTN     Subjective Bill Fox is a 83 y.o. male who presents for hypertension follow up. He does monitor home blood pressures. Blood pressures ranging from 110-120's/70's on average. He is compliant with medication- Cardura  4 mg/d. Patient has these side effects of medication: none He is usually adhering to a healthy diet overall. Current exercise: none No CP or SOB.   Dyslipidemia Patient presents for dyslipidemia follow up. Currently being treated with Crestor  10 mg/d and compliance with treatment thus far has been good. He denies myalgias. Diet/exercise as above. No Cp or SOB.  The patient is known to have coexisting coronary artery disease.   Past Medical History:  Diagnosis Date   Anxiety    At risk for sleep apnea    STOP-BANG= 5     SENT TO PCP 11-29-2013   Cataract    Chronic fatigue    DJD (degenerative joint disease)    RIGHT KNEE   History of colon polyps    2011  &  2013 --  ADENOMATOUS   History of pulmonary embolism    Hyperlipidemia    pt denies ever having high chol   Hypertension    Right knee meniscal tear    Ulcerative colitis     Exam BP 110/60   Pulse 79   Temp 98.2 F (36.8 C)   Resp 18   Ht 6' (1.829 m)   Wt 225 lb (102.1 kg)   SpO2 98%   BMI 30.52 kg/m  General:  well developed, well nourished, in no apparent distress Heart: RRR, no bruits, no LE edema Lungs: clear to auscultation, no accessory muscle use Psych: well oriented with normal range of affect and appropriate judgment/insight  Essential hypertension - Plan: Comprehensive metabolic panel with GFR, Lipid panel  Dyslipidemia  Ulcerative colitis with complication, unspecified location (HCC) - Plan: CBC, IBC + Ferritin  Chronic, stable. Cont Cardura  4 mg/d. Counseled on diet and exercise. Chronic, stable. Cont Crestor  10 mg/d.  F/u on iron  levels.  F/u in 6 mo. The patient voiced understanding and agreement to  the plan.  Mabel Mt [place], DO 02/11/24  11:16 AM

## 2024-02-11 NOTE — Patient Instructions (Addendum)
Give us 2-3 business days to get the results of your labs back.   Keep the diet clean and stay active.  I recommend getting the flu shot in mid October. This suggestion would change if the CDC comes out with a different recommendation.   Let us know if you need anything. 

## 2024-02-19 ENCOUNTER — Telehealth: Payer: Self-pay | Admitting: Internal Medicine

## 2024-02-19 NOTE — Telephone Encounter (Signed)
 Inbound call from patient stating he would like future refills on medication Lomotil  to go through [facility]   Please advise  Thank you

## 2024-02-20 ENCOUNTER — Telehealth: Payer: Self-pay

## 2024-02-20 MED ORDER — IRON (FERROUS SULFATE) 325 (65 FE) MG PO TABS: 325.0000 mg | ORAL_TABLET | Freq: Every day | ORAL | 0 refills | Status: DC

## 2024-02-20 NOTE — Telephone Encounter (Signed)
 Copied from CRM [id number]. Topic: Clinical - Medication Question >> Feb 19, 2024  4:59 PM Dedra B wrote: Reason for CRM: Pt wants to notify provider that he wants to change his Iron , Ferrous Sulfate , 325 (65 Fe) MG TABS to [facility]. He does not need a refill right now.

## 2024-02-20 NOTE — Telephone Encounter (Signed)
 Noted

## 2024-03-03 ENCOUNTER — Other Ambulatory Visit: Payer: Self-pay | Admitting: Family Medicine

## 2024-03-03 ENCOUNTER — Other Ambulatory Visit: Payer: Self-pay | Admitting: Internal Medicine

## 2024-03-08 DIAGNOSIS — Z23 Encounter for immunization: Secondary | ICD-10-CM | POA: Diagnosis not present

## 2024-03-18 DIAGNOSIS — R42 Dizziness and giddiness: Secondary | ICD-10-CM | POA: Diagnosis not present

## 2024-03-19 ENCOUNTER — Other Ambulatory Visit: Payer: Self-pay

## 2024-03-19 DIAGNOSIS — M7989 Other specified soft tissue disorders: Secondary | ICD-10-CM

## 2024-03-19 MED ORDER — POTASSIUM CHLORIDE ER 10 MEQ PO TBCR: 20.0000 meq | EXTENDED_RELEASE_TABLET | Freq: Two times a day (BID) | ORAL | 0 refills | Status: AC

## 2024-03-27 DIAGNOSIS — R0981 Nasal congestion: Secondary | ICD-10-CM | POA: Diagnosis not present

## 2024-03-27 DIAGNOSIS — R059 Cough, unspecified: Secondary | ICD-10-CM | POA: Diagnosis not present

## 2024-03-27 DIAGNOSIS — J40 Bronchitis, not specified as acute or chronic: Secondary | ICD-10-CM | POA: Diagnosis not present

## 2024-03-27 DIAGNOSIS — R509 Fever, unspecified: Secondary | ICD-10-CM | POA: Diagnosis not present

## 2024-04-14 ENCOUNTER — Ambulatory Visit: Admitting: Family Medicine

## 2024-04-14 ENCOUNTER — Encounter: Payer: Self-pay | Admitting: Family Medicine

## 2024-04-14 ENCOUNTER — Ambulatory Visit: Payer: Self-pay | Admitting: Family Medicine

## 2024-04-14 VITALS — BP 112/68 | HR 100 | Temp 98.0°F | Resp 16 | Ht 73.0 in | Wt 210.4 lb

## 2024-04-14 DIAGNOSIS — R634 Abnormal weight loss: Secondary | ICD-10-CM | POA: Diagnosis not present

## 2024-04-14 LAB — CBC
HCT: 44.9 % (ref 39.0–52.0)
Hemoglobin: 14.6 g/dL (ref 13.0–17.0)
MCHC: 32.5 g/dL (ref 30.0–36.0)
MCV: 84.2 fl (ref 78.0–100.0)
Platelets: 235 K/uL (ref 150.0–400.0)
RBC: 5.33 Mil/uL (ref 4.22–5.81)
RDW: 14.6 % (ref 11.5–15.5)
WBC: 6.4 K/uL (ref 4.0–10.5)

## 2024-04-14 LAB — COMPREHENSIVE METABOLIC PANEL WITH GFR
ALT: 8 U/L (ref 0–53)
AST: 14 U/L (ref 0–37)
Albumin: 4.3 g/dL (ref 3.5–5.2)
Alkaline Phosphatase: 67 U/L (ref 39–117)
BUN: 15 mg/dL (ref 6–23)
CO2: 25 meq/L (ref 19–32)
Calcium: 9.1 mg/dL (ref 8.4–10.5)
Chloride: 103 meq/L (ref 96–112)
Creatinine, Ser: 1.54 mg/dL — ABNORMAL HIGH (ref 0.40–1.50)
GFR: 42.17 mL/min — ABNORMAL LOW (ref >60.00)
Glucose, Bld: 97 mg/dL (ref 70–99)
Potassium: 3.8 meq/L (ref 3.5–5.1)
Sodium: 139 meq/L (ref 135–145)
Total Bilirubin: 0.5 mg/dL (ref 0.2–1.2)
Total Protein: 7.9 g/dL (ref 6.0–8.3)

## 2024-04-14 LAB — TSH: TSH: 2.83 u[IU]/mL (ref 0.35–5.50)

## 2024-04-14 NOTE — Patient Instructions (Signed)
 Give us  2-3 business days to get the results of your labs back.   Keep the diet clean and stay active.  This could be because of the prednisone .  Let us  know if you need anything.

## 2024-04-14 NOTE — Progress Notes (Signed)
 Chief Complaint  Patient presents with   Weight Loss    Weight Loss and Urgent Care Follow up    Subjective: Patient is a 83 y.o. male here for wt loss.  Down 35-40 lbs over the past 5 mo. Not intentional. No blood in urine. Some blood ins tool consistent w UC s/s's. Those symptoms are stable. He does not exercise routinely. He is not getting full faster. Sometimes will lay down and cough. No trouble swallowing, N/V, coughing up blood, SOB, fevers, night sweats. CXR at [facility] was normal. He has had prednisone  long term for it. Has not needed since this started.   Past Medical History:  Diagnosis Date   Anxiety    At risk for sleep apnea    STOP-BANG= 5     SENT TO PCP 11-29-2013   Cataract    Chronic fatigue    DJD (degenerative joint disease)    RIGHT KNEE   History of colon polyps    2011  &  2013 --  ADENOMATOUS   History of pulmonary embolism    Hyperlipidemia    pt denies ever having high chol   Hypertension    Right knee meniscal tear    Ulcerative colitis     Objective: BP 112/68 (BP Location: Left Arm, Patient Position: Sitting)   Pulse 100   Temp 98 F (36.7 C) (Oral)   Resp 16   Ht 6' 1 (1.854 m)   Wt 210 lb 6.4 oz (95.4 kg)   SpO2 96%   BMI 27.76 kg/m  General: Awake, appears stated age Heart: RRR, no LE edema Mouth: MMM Lungs: CTAB, no rales, wheezes or rhonchi. No accessory muscle use Psych: Age appropriate judgment and insight, normal affect and mood  Assessment and Plan: Unintentional weight loss - Plan: CBC, Comprehensive metabolic panel with GFR, TSH, Urine Microscopic Only  New problem w uncertain prog. Could be be cause he came off of the prednisone  and is no longer retaining fluid. Ck labs. Recent CXR neg. Would have him f/u with GI if workup neg.  The patient voiced understanding and agreement to the plan.  Mabel Mt [place], DO 04/14/24  10:58 AM

## 2024-04-30 ENCOUNTER — Telehealth: Payer: Self-pay | Admitting: Internal Medicine

## 2024-04-30 NOTE — Telephone Encounter (Signed)
 Reviewed. This patient with hypokalemia due to diuretics and chronic diarrhea, it is okay.

## 2024-04-30 NOTE — Telephone Encounter (Signed)
Spoke with pt and he is aware of Dr. Perry's recommendations. 

## 2024-04-30 NOTE — Telephone Encounter (Signed)
 PT is requesting to speak with a nurse. He was told by the pharmacist that he shouldn't take potassium with lomotil . Please advise.

## 2024-04-30 NOTE — Telephone Encounter (Signed)
 See note below and advise. If ok for pt to take lomotil  along with his potassium.

## 2024-05-04 ENCOUNTER — Ambulatory Visit: Admitting: Internal Medicine

## 2024-05-05 ENCOUNTER — Encounter: Payer: Self-pay | Admitting: Family Medicine

## 2024-05-05 ENCOUNTER — Ambulatory Visit: Admitting: Family Medicine

## 2024-05-05 VITALS — BP 122/68 | HR 98 | Temp 98.0°F | Resp 16 | Ht 73.0 in | Wt 208.0 lb

## 2024-05-05 DIAGNOSIS — H8112 Benign paroxysmal vertigo, left ear: Secondary | ICD-10-CM

## 2024-05-05 NOTE — Patient Instructions (Addendum)
 Stay hydrated.  If you do not hear anything about your referral in the next 1-2 weeks, call our office and ask for an update.  Let us  know if you need anything.

## 2024-05-05 NOTE — Progress Notes (Signed)
 Chief Complaint  Patient presents with   Dizziness    Dizziness    Bill Fox is 83 y.o. pt here for dizziness.  Duration: 1 year Happens rarely, meclizine  helps. No side effects.  Pass out? No Spinning? Yes Recent illness/fever? No Headache? No Neurologic signs? No Change in PO intake? No Palpitations? No No EtOH or illicit drug use.   Past Medical History:  Diagnosis Date   Anxiety    At risk for sleep apnea    STOP-BANG= 5     SENT TO PCP 11-29-2013   Cataract    Chronic fatigue    DJD (degenerative joint disease)    RIGHT KNEE   History of colon polyps    2011  &  2013 --  ADENOMATOUS   History of pulmonary embolism    Hyperlipidemia    pt denies ever having high chol   Hypertension    Right knee meniscal tear    Ulcerative colitis     BP 122/68 (BP Location: Left Arm, Patient Position: Sitting)   Pulse 98   Temp 98 F (36.7 C) (Oral)   Resp 16   Ht 6' 1 (1.854 m)   Wt 208 lb (94.3 kg)   SpO2 96%   BMI 27.44 kg/m  General: Awake, alert, appears stated age Eyes: PERRLA, EOMi Ears: Patent, TM's neg b/l Heart: RRR, no murmurs, no carotid bruits Lungs: CTAB, no accessory muscle use MSK: 5/5 strength throughout, gait normal Neuro: No cerebellar signs, patellar reflex 2/4 b/l wo clonus, calcaneal reflex 0/4 b/l wo clonus, biceps reflex 1/4 b/l wo clonus; Dix-Hall-Pike negative on R, + on L. Psych: Age appropriate judgment and insight, normal mood and affect  Benign paroxysmal positional vertigo of left ear - Plan: Ambulatory referral to Physical Therapy  Stay hydrated. Refer vest rehab, [facility] also provided. F/u prn. Pt voiced understanding and agreement to the plan.  [name], DO 05/05/2024 10:19 AM

## 2024-05-06 DIAGNOSIS — M898X6 Other specified disorders of bone, lower leg: Secondary | ICD-10-CM | POA: Diagnosis not present

## 2024-05-07 ENCOUNTER — Telehealth: Payer: Self-pay

## 2024-05-07 NOTE — Telephone Encounter (Signed)
 R what is painful? I'm OK to refer, just need the specific reason why.

## 2024-05-07 NOTE — Telephone Encounter (Signed)
 Copied from CRM #[id number]. Topic: Referral - Request for Referral >> May 06, 2024  4:57 PM Rea ORN wrote: Did the patient discuss referral with their provider in the last year? No (If No - schedule appointment) (If Yes - send message)  Appointment offered? Yes  Type of order/referral and detailed reason for visit: Pt stated right painful. He went to UC (pt did not remember the name) yesterday and they told him to get referral from PCP. Pt declined appt due just being seen for unrelated issue yesterday.  Preference of office, provider, location: Pt would like to see ortho within [place] in same building of PCP  If referral order, have you been seen by this specialty before? No (If Yes, this issue or another issue? When? Where?  Can we respond through MyChart? No  Please call back [phone]

## 2024-05-07 NOTE — Telephone Encounter (Signed)
 Called pt appt schedule for Knee pain/injection

## 2024-05-11 ENCOUNTER — Ambulatory Visit (INDEPENDENT_AMBULATORY_CARE_PROVIDER_SITE_OTHER): Admitting: Family Medicine

## 2024-05-11 ENCOUNTER — Ambulatory Visit: Admitting: Family Medicine

## 2024-05-11 ENCOUNTER — Encounter: Payer: Self-pay | Admitting: Family Medicine

## 2024-05-11 VITALS — BP 118/70 | HR 93 | Temp 98.0°F | Resp 16 | Ht 73.0 in | Wt 208.0 lb

## 2024-05-11 DIAGNOSIS — M7051 Other bursitis of knee, right knee: Secondary | ICD-10-CM

## 2024-05-11 NOTE — Progress Notes (Signed)
 Musculoskeletal Exam  Patient: Bill Fox DOB: 1941/01/27  DOS: 05/11/2024  SUBJECTIVE:  Chief Complaint:   Chief Complaint  Patient presents with   Knee Pain    Right Knee Pain    Bill Fox is a 83 y.o.  male for evaluation and treatment of R knee pain.   Onset:  5 days ago. No inj or change in activity.  Location: bony part of tibia Character:  aching and sharp  Progression of issue:  has significantly improved Associated symptoms: redness and warm No bruising, fevers Treatment: to date has been prednisone  burst and an antibiotic.   Neurovascular symptoms: no  Past Medical History:  Diagnosis Date   Anxiety    At risk for sleep apnea    STOP-BANG= 5     SENT TO PCP 11-29-2013   Cataract    Chronic fatigue    DJD (degenerative joint disease)    RIGHT KNEE   History of colon polyps    2011  &  2013 --  ADENOMATOUS   History of pulmonary embolism    Hyperlipidemia    pt denies ever having high chol   Hypertension    Right knee meniscal tear    Ulcerative colitis     Objective: VITAL SIGNS: BP 118/70 (BP Location: Left Arm, Patient Position: Sitting)   Pulse 93   Temp 98 F (36.7 C) (Oral)   Resp 16   Ht 6' 1 (1.854 m)   Wt 208 lb (94.3 kg)   SpO2 100%   BMI 27.44 kg/m  Constitutional: Well formed, well developed. No acute distress. Thorax & Lungs: No accessory muscle use Musculoskeletal: R knee.   Tenderness to palpation: yes, mild ttp over tibial tubercle/infrapatellar bursa Deformity: no Ecchymosis: no Neurologic: Normal sensory function.  Psychiatric: Normal mood. Age appropriate judgment and insight. Alert & oriented x 3.    Assessment:  Infrapatellar bursitis of right knee  Plan: Finish abx, heat, ice, Tylenol .  No signs of infection or worsening symptoms today.  He will let me know if anything changes. F/u prn. The patient voiced understanding and agreement to the plan.   Mabel [place], DO 05/11/2024  4:09 PM

## 2024-05-11 NOTE — Patient Instructions (Addendum)
 Ice/cold pack over area for 10-15 min twice daily.  OK to take Tylenol  1000 mg (2 extra strength tabs) or 975 mg (3 regular strength tabs) every 6 hours as needed.  Finish the antibiotic.   Activity as tolerated.   Let us  know if you need anything.

## 2024-05-15 ENCOUNTER — Other Ambulatory Visit: Payer: Self-pay | Admitting: Family Medicine

## 2024-05-31 ENCOUNTER — Ambulatory Visit: Admitting: Family Medicine

## 2024-05-31 ENCOUNTER — Encounter: Payer: Self-pay | Admitting: Family Medicine

## 2024-05-31 VITALS — BP 124/76 | HR 82 | Temp 98.0°F | Resp 16 | Ht 73.0 in | Wt 204.0 lb

## 2024-05-31 DIAGNOSIS — E785 Hyperlipidemia, unspecified: Secondary | ICD-10-CM

## 2024-05-31 DIAGNOSIS — G4700 Insomnia, unspecified: Secondary | ICD-10-CM

## 2024-05-31 MED ORDER — ROSUVASTATIN CALCIUM 10 MG PO TABS: 10.0000 mg | ORAL_TABLET | Freq: Every day | ORAL | 3 refills | Status: AC

## 2024-05-31 MED ORDER — MIRTAZAPINE 30 MG PO TABS: 30.0000 mg | ORAL_TABLET | Freq: Every day | ORAL | 3 refills | Status: AC

## 2024-05-31 NOTE — Patient Instructions (Signed)
 Keep the diet clean and stay active.  Let us know if you need anything.

## 2024-05-31 NOTE — Progress Notes (Signed)
 Chief Complaint  Patient presents with   Medication Refill    Medication Check    Subjective: Dyslipidemia Patient presents for dyslipidemia follow up. Currently taking Crestor  10 mg/d and compliance with treatment thus far has been good. He denies myalgias. He is usually adhering to a healthy diet. Exercise: Some walking No CP or SOB.  The patient is not known to have coexisting coronary artery disease.  Insomnia History of insomnia currently taking Remeron  30 mg nightly.  Compliant, no adverse effects.  Sleeping well on this regimen.  Past Medical History:  Diagnosis Date   Anxiety    At risk for sleep apnea    STOP-BANG= 5     SENT TO PCP 11-29-2013   Cataract    Chronic fatigue    DJD (degenerative joint disease)    RIGHT KNEE   History of colon polyps    2011  &  2013 --  ADENOMATOUS   History of pulmonary embolism    Hyperlipidemia    pt denies ever having high chol   Hypertension    Right knee meniscal tear    Ulcerative colitis     Objective: BP 124/76 (BP Location: Left Arm, Patient Position: Sitting)   Pulse 82   Temp 98 F (36.7 C) (Oral)   Resp 16   Ht 6' 1 (1.854 m)   Wt 204 lb (92.5 kg)   SpO2 96%   BMI 26.91 kg/m  General: Awake, appears stated age Heart: RRR, no LE edema, no bruits Lungs: CTAB, no rales, wheezes or rhonchi. No accessory muscle use Psych: Age appropriate judgment and insight, normal affect and mood  Assessment and Plan: Dyslipidemia  Chronic, stable. Cont Crestor  10 mg/d. Counseled on diet/exercise.  Chronic, stable. Cont Remeron  30 mg/d.  F/u in 6 mo. The patient voiced understanding and agreement to the plan.  Mabel [place], DO 05/31/2024  10:53 AM

## 2024-06-04 ENCOUNTER — Other Ambulatory Visit: Payer: Self-pay | Admitting: Family Medicine

## 2024-06-04 DIAGNOSIS — M7989 Other specified soft tissue disorders: Secondary | ICD-10-CM

## 2024-06-08 ENCOUNTER — Encounter: Payer: Self-pay | Admitting: Internal Medicine

## 2024-06-08 ENCOUNTER — Ambulatory Visit: Admitting: Internal Medicine

## 2024-06-08 VITALS — BP 118/50 | Ht 73.0 in | Wt 205.4 lb

## 2024-06-08 DIAGNOSIS — Z7901 Long term (current) use of anticoagulants: Secondary | ICD-10-CM | POA: Diagnosis not present

## 2024-06-08 DIAGNOSIS — K51219 Ulcerative (chronic) proctitis with unspecified complications: Secondary | ICD-10-CM

## 2024-06-08 DIAGNOSIS — K51919 Ulcerative colitis, unspecified with unspecified complications: Secondary | ICD-10-CM

## 2024-06-08 DIAGNOSIS — R197 Diarrhea, unspecified: Secondary | ICD-10-CM

## 2024-06-08 DIAGNOSIS — K5150 Left sided colitis without complications: Secondary | ICD-10-CM | POA: Diagnosis not present

## 2024-06-08 NOTE — Patient Instructions (Signed)
 Please follow up in 6 months.  _______________________________________________________  If your blood pressure at your visit was 140/90 or greater, please contact your primary care physician to follow up on this.  _______________________________________________________  If you are age 82 or older, your body mass index should be between 23-30. Your Body mass index is 27.1 kg/m. If this is out of the aforementioned range listed, please consider follow up with your Primary Care Provider.  If you are age 23 or younger, your body mass index should be between 19-25. Your Body mass index is 27.1 kg/m. If this is out of the aformentioned range listed, please consider follow up with your Primary Care Provider.   ________________________________________________________  The [facility] providers would like to encourage you to use MYCHART to communicate with providers for non-urgent requests or questions.  Due to long hold times on the telephone, sending your provider a message by [facility] may be a faster and more efficient way to get a response.  Please allow 48 business hours for a response.  Please remember that this is for non-urgent requests.  _______________________________________________________  [facility] is using a team-based approach to care.  Your team is made up of your doctor and two to three APPS. Our APPS (Nurse Practitioners and Physician Assistants) work with your physician to ensure care continuity for you. They are fully qualified to address your health concerns and develop a treatment plan. They communicate directly with your gastroenterologist to care for you. Seeing the Advanced Practice Practitioners on your physician's team can help you by facilitating care more promptly, often allowing for earlier appointments, access to diagnostic testing, procedures, and other specialty referrals.

## 2024-06-08 NOTE — Progress Notes (Signed)
 HISTORY OF PRESENT ILLNESS:  Bill Fox is a 84 y.o. male with multiple medical problems including history of pulmonary embolism for which he is on Eliquis .  He is followed in this office for chronic left-sided ulcerative colitis.  Last office visit September 2025.  At that time his disease was under reasonable control on a combination of Apriso  and Lomotil .  This follow-up appointment arranged.  Last complete colonoscopy October 2024 with moderately severe active left-sided colitis.  Most recent blood work November 2025 shows a normal hemoglobin of 14.6.  He continues on Apriso  1.5 g daily and Lomotil  2 p.o. twice daily.  Today he tells me that he is doing well.  He has multiple small bowel movements per day which are formed.  Rare blood.  Rare incontinence.  No abdominal pain.  Recently saw his PCP.  Reviewed.  REVIEW OF SYSTEMS:  All non-GI ROS negative except for knee pain  Past Medical History:  Diagnosis Date   Anxiety    At risk for sleep apnea    STOP-BANG= 5     SENT TO PCP 11-29-2013   Cataract    Chronic fatigue    DJD (degenerative joint disease)    RIGHT KNEE   History of colon polyps    2011  &  2013 --  ADENOMATOUS   History of pulmonary embolism    Hyperlipidemia    pt denies ever having high chol   Hypertension    Right knee meniscal tear    Ulcerative colitis     Past Surgical History:  Procedure Laterality Date   CATARACT EXTRACTION W/ INTRAOCULAR LENS IMPLANT Left 2014   COLONOSCOPY W/ POLYPECTOMY  MULTIPLE--  LAST ONE 09-12-2011   KNEE ARTHROSCOPY WITH DRILLING/MICROFRACTURE Right 12/03/2013   Procedure: RIGHT KNEE ARTHROSCOPY PARTIAL MEDIAL MENISCECTOMY DEBRIDEMENT SYNOVECTOMY;  Surgeon: Reyes JAYSON Billing, MD;  Location: [facility];  Service: Orthopedics;  Laterality: Right;   NASAL FRACTURE SURGERY  1985   TRANSTHORACIC ECHOCARDIOGRAM  11-01-2008   MILD LVH/  EF 55%/  GRADE I DIASTOLIC DYSFUNCTION/  MILD LAE    Social History Bill Fox  reports that he quit smoking about 31 years ago. His smoking use included cigarettes. He started smoking about 61 years ago. He has a 30 pack-year smoking history. He has never used smokeless tobacco. He reports that he does not currently use alcohol. He reports that he does not use drugs.  family history includes Colon cancer (age of onset: 31) in his maternal uncle; Diabetes in his mother and paternal uncle; Lung cancer in his brother; Stomach cancer in his father.  Allergies[1]     PHYSICAL EXAMINATION: Vital signs: BP (!) 118/50   Ht 6' 1 (1.854 m)   Wt 205 lb 6 oz (93.2 kg)   BMI 27.10 kg/m   Constitutional: generally well-appearing, no acute distress Psychiatric: alert and oriented x3, cooperative Eyes: extraocular movements intact, anicteric, conjunctiva pink Mouth: oral pharynx moist, no lesions Neck: supple no lymphadenopathy Cardiovascular: heart regular rate and rhythm, no murmur Lungs: clear to auscultation bilaterally Abdomen: soft, nontender, nondistended, no obvious ascites, no peritoneal signs, normal bowel sounds, no organomegaly Rectal: Omitted Extremities: no clubbing, cyanosis, or lower extremity edema bilaterally Skin: no relevant lesions on visible extremities Neuro: No focal deficits.  Cranial nerves intact  ASSESSMENT:   1.  Chronic left-sided ulcerative colitis.  Disease under reasonable control on Apriso  (mesalamine ) 1.5 g daily and daily Lomotil  2.  Multiple medical problems.  Stable  PLAN:   1.  Continue Apriso  1.5 g daily 2.  Increase Lomotil  2 p.o. twice daily.  Decrease if constipation develops 3.  Refill Lomotil .  Medication side effects and risks reviewed 4.  Routine office follow-up 6 months A total time of 30 minutes was spent preparing to see the patient, obtaining comprehensive history, performing medically appropriate physical examination, counseling and educating the patient regarding the above listed issues, ordering  medication, arranging follow-up, and documenting clinical information in the health record      [1] No Known Allergies

## 2024-06-09 ENCOUNTER — Telehealth: Payer: Self-pay | Admitting: Internal Medicine

## 2024-06-09 ENCOUNTER — Other Ambulatory Visit: Payer: Self-pay | Admitting: Internal Medicine

## 2024-06-09 NOTE — Telephone Encounter (Signed)
 Inbound call from patient stating he needs lomotil  medication refilled and sent to [facility]  Please advise  Thank you

## 2024-06-10 MED ORDER — DIPHENOXYLATE-ATROPINE 2.5-0.025 MG PO TABS: 2.0000 | ORAL_TABLET | Freq: Three times a day (TID) | ORAL | 3 refills | Status: AC

## 2024-06-10 NOTE — Telephone Encounter (Signed)
 [facility] [place]

## 2024-11-04 ENCOUNTER — Ambulatory Visit

## 2024-11-10 ENCOUNTER — Ambulatory Visit
